# Patient Record
Sex: Male | Born: 1935 | Race: White | Hispanic: No | Marital: Married | Smoking: Never smoker
Health system: Southern US, Community
[De-identification: ages and names within clinical notes are randomized; demographics above are authoritative.]

## PROBLEM LIST (undated history)

## (undated) DIAGNOSIS — R251 Tremor, unspecified: Secondary | ICD-10-CM

## (undated) DIAGNOSIS — K5790 Diverticulosis of intestine, part unspecified, without perforation or abscess without bleeding: Secondary | ICD-10-CM

## (undated) DIAGNOSIS — R531 Weakness: Secondary | ICD-10-CM

## (undated) DIAGNOSIS — C089 Malignant neoplasm of major salivary gland, unspecified: Principal | ICD-10-CM

## (undated) DIAGNOSIS — D126 Benign neoplasm of colon, unspecified: Secondary | ICD-10-CM

## (undated) DIAGNOSIS — K449 Diaphragmatic hernia without obstruction or gangrene: Secondary | ICD-10-CM

## (undated) DIAGNOSIS — R0689 Other abnormalities of breathing: Secondary | ICD-10-CM

## (undated) DIAGNOSIS — IMO0002 Reserved for concepts with insufficient information to code with codable children: Secondary | ICD-10-CM

## (undated) DIAGNOSIS — G510 Bell's palsy: Secondary | ICD-10-CM

## (undated) DIAGNOSIS — R0600 Dyspnea, unspecified: Secondary | ICD-10-CM

## (undated) DIAGNOSIS — K222 Esophageal obstruction: Secondary | ICD-10-CM

## (undated) DIAGNOSIS — G629 Polyneuropathy, unspecified: Secondary | ICD-10-CM

## (undated) DIAGNOSIS — M48061 Spinal stenosis, lumbar region without neurogenic claudication: Secondary | ICD-10-CM

## (undated) DIAGNOSIS — R269 Unspecified abnormalities of gait and mobility: Secondary | ICD-10-CM

## (undated) DIAGNOSIS — Z923 Personal history of irradiation: Secondary | ICD-10-CM

## (undated) DIAGNOSIS — E785 Hyperlipidemia, unspecified: Secondary | ICD-10-CM

## (undated) DIAGNOSIS — H9190 Unspecified hearing loss, unspecified ear: Secondary | ICD-10-CM

## (undated) DIAGNOSIS — K219 Gastro-esophageal reflux disease without esophagitis: Secondary | ICD-10-CM

## (undated) DIAGNOSIS — D472 Monoclonal gammopathy: Secondary | ICD-10-CM

## (undated) DIAGNOSIS — R42 Dizziness and giddiness: Secondary | ICD-10-CM

## (undated) DIAGNOSIS — D751 Secondary polycythemia: Secondary | ICD-10-CM

## (undated) DIAGNOSIS — I10 Essential (primary) hypertension: Secondary | ICD-10-CM

## (undated) DIAGNOSIS — Z7189 Other specified counseling: Secondary | ICD-10-CM

## (undated) DIAGNOSIS — C4440 Unspecified malignant neoplasm of skin of scalp and neck: Secondary | ICD-10-CM

## (undated) DIAGNOSIS — J849 Interstitial pulmonary disease, unspecified: Secondary | ICD-10-CM

## (undated) DIAGNOSIS — IMO0001 Reserved for inherently not codable concepts without codable children: Secondary | ICD-10-CM

## (undated) DIAGNOSIS — J984 Other disorders of lung: Secondary | ICD-10-CM

## (undated) DIAGNOSIS — G250 Essential tremor: Secondary | ICD-10-CM

## (undated) DIAGNOSIS — R0609 Other forms of dyspnea: Secondary | ICD-10-CM

## (undated) DIAGNOSIS — D72820 Lymphocytosis (symptomatic): Secondary | ICD-10-CM

## (undated) DIAGNOSIS — R0989 Other specified symptoms and signs involving the circulatory and respiratory systems: Secondary | ICD-10-CM

## (undated) DIAGNOSIS — I493 Ventricular premature depolarization: Secondary | ICD-10-CM

## (undated) DIAGNOSIS — G519 Disorder of facial nerve, unspecified: Secondary | ICD-10-CM

## (undated) DIAGNOSIS — R634 Abnormal weight loss: Secondary | ICD-10-CM

## (undated) DIAGNOSIS — J986 Disorders of diaphragm: Secondary | ICD-10-CM

## (undated) DIAGNOSIS — R630 Anorexia: Secondary | ICD-10-CM

## (undated) DIAGNOSIS — R413 Other amnesia: Secondary | ICD-10-CM

## (undated) DIAGNOSIS — I080 Rheumatic disorders of both mitral and aortic valves: Secondary | ICD-10-CM

## (undated) DIAGNOSIS — E039 Hypothyroidism, unspecified: Secondary | ICD-10-CM

## (undated) DIAGNOSIS — C770 Secondary and unspecified malignant neoplasm of lymph nodes of head, face and neck: Secondary | ICD-10-CM

## (undated) DIAGNOSIS — G459 Transient cerebral ischemic attack, unspecified: Secondary | ICD-10-CM

## (undated) HISTORY — DX: Other forms of dyspnea: R06.09

## (undated) HISTORY — DX: Weakness: R53.1

## (undated) HISTORY — PX: NASAL SINUS SURGERY: SHX719

## (undated) HISTORY — DX: Dyspnea, unspecified: R06.00

## (undated) HISTORY — PX: APPENDECTOMY: SHX54

## (undated) HISTORY — DX: Essential (primary) hypertension: I10

## (undated) HISTORY — DX: Diaphragmatic hernia without obstruction or gangrene: K44.9

## (undated) HISTORY — DX: Polyneuropathy, unspecified: G62.9

## (undated) HISTORY — DX: Disorder of facial nerve, unspecified: G51.9

## (undated) HISTORY — PX: INGUINAL HERNIA REPAIR: SUR1180

## (undated) HISTORY — DX: Unspecified abnormalities of gait and mobility: R26.9

## (undated) HISTORY — DX: Interstitial pulmonary disease, unspecified: J84.9

## (undated) HISTORY — DX: Lymphocytosis (symptomatic): D72.820

## (undated) HISTORY — DX: Anorexia: R63.0

## (undated) HISTORY — DX: Secondary and unspecified malignant neoplasm of lymph nodes of head, face and neck: C77.0

## (undated) HISTORY — DX: Unspecified hearing loss, unspecified ear: H91.90

## (undated) HISTORY — PX: CARDIAC CATHETERIZATION: SHX172

## (undated) HISTORY — DX: Monoclonal gammopathy: D47.2

## (undated) HISTORY — PX: CARDIAC VALVE REPLACEMENT: SHX585

## (undated) HISTORY — DX: Tremor, unspecified: R25.1

## (undated) HISTORY — DX: Abnormal weight loss: R63.4

## (undated) HISTORY — DX: Other abnormalities of breathing: R06.89

## (undated) HISTORY — DX: Transient cerebral ischemic attack, unspecified: G45.9

## (undated) HISTORY — DX: Other specified symptoms and signs involving the circulatory and respiratory systems: R09.89

## (undated) HISTORY — DX: Personal history of irradiation: Z92.3

## (undated) HISTORY — DX: Other specified counseling: Z71.89

## (undated) HISTORY — PX: TONSILLECTOMY: SUR1361

## (undated) HISTORY — DX: Rheumatic disorders of both mitral and aortic valves: I08.0

## (undated) HISTORY — DX: Disorders of diaphragm: J98.6

## (undated) HISTORY — DX: Diverticulosis of intestine, part unspecified, without perforation or abscess without bleeding: K57.90

## (undated) HISTORY — PX: CATARACT EXTRACTION: SUR2

## (undated) HISTORY — DX: Hyperlipidemia, unspecified: E78.5

## (undated) HISTORY — DX: Other disorders of lung: J98.4

## (undated) HISTORY — DX: Benign neoplasm of colon, unspecified: D12.6

## (undated) HISTORY — DX: Other amnesia: R41.3

## (undated) HISTORY — DX: Esophageal obstruction: K22.2

## (undated) HISTORY — DX: Spinal stenosis, lumbar region without neurogenic claudication: M48.061

## (undated) HISTORY — DX: Essential tremor: G25.0

## (undated) HISTORY — DX: Bell's palsy: G51.0

## (undated) HISTORY — DX: Unspecified malignant neoplasm of skin of scalp and neck: C44.40

## (undated) HISTORY — DX: Ventricular premature depolarization: I49.3

## (undated) HISTORY — DX: Secondary polycythemia: D75.1

## (undated) HISTORY — DX: Hypothyroidism, unspecified: E03.9

## (undated) HISTORY — DX: Malignant neoplasm of major salivary gland, unspecified: C08.9

## (undated) HISTORY — DX: Dizziness and giddiness: R42

## (undated) HISTORY — DX: Gastro-esophageal reflux disease without esophagitis: K21.9

## (undated) HISTORY — PX: LUMBAR LAMINECTOMY: SHX95

## (undated) HISTORY — PX: ESOPHAGEAL DILATION: SHX303

---

## 1996-06-06 HISTORY — PX: CHOLECYSTECTOMY: SHX55

## 1998-02-04 ENCOUNTER — Ambulatory Visit (HOSPITAL_COMMUNITY): Admission: RE | Admit: 1998-02-04 | Discharge: 1998-02-04 | Payer: Self-pay | Admitting: Gastroenterology

## 2003-03-21 ENCOUNTER — Encounter (INDEPENDENT_AMBULATORY_CARE_PROVIDER_SITE_OTHER): Payer: Self-pay | Admitting: Specialist

## 2003-03-21 ENCOUNTER — Observation Stay (HOSPITAL_COMMUNITY): Admission: RE | Admit: 2003-03-21 | Discharge: 2003-03-21 | Payer: Self-pay

## 2003-06-07 DIAGNOSIS — R42 Dizziness and giddiness: Secondary | ICD-10-CM

## 2003-06-07 HISTORY — DX: Dizziness and giddiness: R42

## 2003-10-01 ENCOUNTER — Encounter: Payer: Self-pay | Admitting: Gastroenterology

## 2003-10-01 ENCOUNTER — Ambulatory Visit (HOSPITAL_COMMUNITY): Admission: RE | Admit: 2003-10-01 | Discharge: 2003-10-01 | Payer: Self-pay | Admitting: Gastroenterology

## 2004-04-21 ENCOUNTER — Ambulatory Visit: Payer: Self-pay | Admitting: Internal Medicine

## 2004-05-10 ENCOUNTER — Ambulatory Visit: Payer: Self-pay | Admitting: Internal Medicine

## 2004-07-29 ENCOUNTER — Ambulatory Visit: Payer: Self-pay | Admitting: Gastroenterology

## 2004-08-09 ENCOUNTER — Ambulatory Visit: Payer: Self-pay | Admitting: Gastroenterology

## 2004-08-30 ENCOUNTER — Ambulatory Visit: Payer: Self-pay | Admitting: Internal Medicine

## 2004-09-06 ENCOUNTER — Ambulatory Visit: Payer: Self-pay | Admitting: Internal Medicine

## 2004-09-13 ENCOUNTER — Ambulatory Visit: Payer: Self-pay | Admitting: Cardiology

## 2004-11-15 ENCOUNTER — Ambulatory Visit: Payer: Self-pay | Admitting: Internal Medicine

## 2005-07-28 ENCOUNTER — Ambulatory Visit: Payer: Self-pay | Admitting: Internal Medicine

## 2005-08-03 ENCOUNTER — Ambulatory Visit: Payer: Self-pay | Admitting: Internal Medicine

## 2005-08-09 ENCOUNTER — Ambulatory Visit: Payer: Self-pay | Admitting: Gastroenterology

## 2005-08-17 ENCOUNTER — Ambulatory Visit: Payer: Self-pay | Admitting: Internal Medicine

## 2005-11-15 ENCOUNTER — Encounter: Admission: RE | Admit: 2005-11-15 | Discharge: 2005-11-15 | Payer: Self-pay | Admitting: Orthopedic Surgery

## 2005-11-30 ENCOUNTER — Ambulatory Visit: Payer: Self-pay | Admitting: Internal Medicine

## 2006-05-11 ENCOUNTER — Ambulatory Visit: Payer: Self-pay | Admitting: Gastroenterology

## 2006-06-10 ENCOUNTER — Encounter: Admission: RE | Admit: 2006-06-10 | Discharge: 2006-06-10 | Payer: Self-pay | Admitting: Neurology

## 2006-07-25 ENCOUNTER — Ambulatory Visit: Payer: Self-pay | Admitting: Internal Medicine

## 2006-08-31 ENCOUNTER — Ambulatory Visit: Payer: Self-pay | Admitting: Internal Medicine

## 2006-08-31 LAB — CONVERTED CEMR LAB
ALT: 21 units/L (ref 0–40)
Albumin: 4.1 g/dL (ref 3.5–5.2)
Alkaline Phosphatase: 40 units/L (ref 39–117)
BUN: 16 mg/dL (ref 6–23)
Basophils Absolute: 0 10*3/uL (ref 0.0–0.1)
Basophils Relative: 0.4 % (ref 0.0–1.0)
CO2: 30 meq/L (ref 19–32)
Calcium: 9.4 mg/dL (ref 8.4–10.5)
Cholesterol: 165 mg/dL (ref 0–200)
GFR calc Af Amer: 106 mL/min
HDL: 54 mg/dL (ref >39.0)
Hemoglobin: 15.4 g/dL (ref 13.0–17.0)
LDL Cholesterol: 90 mg/dL (ref 0–99)
Lymphocytes Relative: 48 % — ABNORMAL HIGH (ref 12.0–46.0)
MCHC: 34.7 g/dL (ref 30.0–36.0)
Monocytes Absolute: 0.5 10*3/uL (ref 0.2–0.7)
Monocytes Relative: 5.6 % (ref 3.0–11.0)
Neutro Abs: 3.7 10*3/uL (ref 1.4–7.7)
Platelets: 238 10*3/uL (ref 150–400)
Potassium: 3.7 meq/L (ref 3.5–5.1)
RDW: 11.8 % (ref 11.5–14.6)
TSH: 3.1 microintl units/mL (ref 0.35–5.50)
Triglycerides: 105 mg/dL (ref 0–149)
VLDL: 21 mg/dL (ref 0–40)

## 2006-09-15 ENCOUNTER — Ambulatory Visit: Payer: Self-pay | Admitting: Internal Medicine

## 2006-09-15 LAB — CONVERTED CEMR LAB
Basophils Relative: 3.4 % — ABNORMAL HIGH (ref 0.0–1.0)
Eosinophils Absolute: 0.3 10*3/uL (ref 0.0–0.6)
Eosinophils Relative: 3.3 % (ref 0.0–5.0)
HCT: 44.8 % (ref 39.0–52.0)
Hemoglobin: 15.2 g/dL (ref 13.0–17.0)
Lipase: 28 units/L (ref 11.0–59.0)
Lymphocytes Relative: 45.2 % (ref 12.0–46.0)
MCV: 93.9 fL (ref 78.0–100.0)
Monocytes Absolute: 0.3 10*3/uL (ref 0.2–0.7)
Neutro Abs: 3.5 10*3/uL (ref 1.4–7.7)
Neutrophils Relative %: 43.8 % (ref 43.0–77.0)
Platelets: 238 10*3/uL (ref 150–400)
WBC: 8 10*3/uL (ref 4.5–10.5)

## 2006-12-05 ENCOUNTER — Telehealth: Payer: Self-pay | Admitting: Internal Medicine

## 2007-03-31 ENCOUNTER — Observation Stay (HOSPITAL_COMMUNITY): Admission: EM | Admit: 2007-03-31 | Discharge: 2007-04-01 | Payer: Self-pay | Admitting: Otolaryngology

## 2007-04-03 ENCOUNTER — Encounter: Admission: RE | Admit: 2007-04-03 | Discharge: 2007-04-03 | Payer: Self-pay | Admitting: Neurology

## 2007-04-18 ENCOUNTER — Encounter: Payer: Self-pay | Admitting: Internal Medicine

## 2007-05-21 ENCOUNTER — Encounter: Admission: RE | Admit: 2007-05-21 | Discharge: 2007-05-21 | Payer: Self-pay | Admitting: Neurology

## 2007-08-21 ENCOUNTER — Telehealth (INDEPENDENT_AMBULATORY_CARE_PROVIDER_SITE_OTHER): Payer: Self-pay | Admitting: *Deleted

## 2007-08-21 ENCOUNTER — Ambulatory Visit: Payer: Self-pay | Admitting: Internal Medicine

## 2007-08-27 ENCOUNTER — Ambulatory Visit: Payer: Self-pay | Admitting: Internal Medicine

## 2007-09-05 ENCOUNTER — Telehealth (INDEPENDENT_AMBULATORY_CARE_PROVIDER_SITE_OTHER): Payer: Self-pay | Admitting: *Deleted

## 2008-02-14 ENCOUNTER — Ambulatory Visit: Payer: Self-pay | Admitting: Internal Medicine

## 2008-02-14 DIAGNOSIS — K219 Gastro-esophageal reflux disease without esophagitis: Secondary | ICD-10-CM

## 2008-02-14 DIAGNOSIS — Z860100 Personal history of colon polyps, unspecified: Secondary | ICD-10-CM | POA: Insufficient documentation

## 2008-02-14 DIAGNOSIS — I10 Essential (primary) hypertension: Secondary | ICD-10-CM

## 2008-02-14 DIAGNOSIS — I080 Rheumatic disorders of both mitral and aortic valves: Secondary | ICD-10-CM

## 2008-02-14 DIAGNOSIS — E039 Hypothyroidism, unspecified: Secondary | ICD-10-CM

## 2008-02-14 DIAGNOSIS — K5730 Diverticulosis of large intestine without perforation or abscess without bleeding: Secondary | ICD-10-CM | POA: Insufficient documentation

## 2008-02-14 DIAGNOSIS — Z85828 Personal history of other malignant neoplasm of skin: Secondary | ICD-10-CM

## 2008-02-14 HISTORY — DX: Hypothyroidism, unspecified: E03.9

## 2008-02-14 HISTORY — DX: Rheumatic disorders of both mitral and aortic valves: I08.0

## 2008-02-14 HISTORY — DX: Gastro-esophageal reflux disease without esophagitis: K21.9

## 2008-02-14 HISTORY — DX: Essential (primary) hypertension: I10

## 2008-02-14 LAB — CONVERTED CEMR LAB
Blood in Urine, dipstick: NEGATIVE
Ketones, urine, test strip: NEGATIVE
Nitrite: NEGATIVE
Protein, U semiquant: NEGATIVE
Specific Gravity, Urine: 1.02

## 2008-02-15 ENCOUNTER — Encounter (INDEPENDENT_AMBULATORY_CARE_PROVIDER_SITE_OTHER): Payer: Self-pay | Admitting: *Deleted

## 2008-02-15 LAB — CONVERTED CEMR LAB
AST: 22 units/L (ref 0–37)
Alkaline Phosphatase: 51 units/L (ref 39–117)
Basophils Absolute: 0 10*3/uL (ref 0.0–0.1)
Chloride: 106 meq/L (ref 96–112)
Eosinophils Absolute: 0.1 10*3/uL (ref 0.0–0.7)
Eosinophils Relative: 1.6 % (ref 0.0–5.0)
GFR calc non Af Amer: 77 mL/min
HDL: 52.2 mg/dL (ref >39.0)
Lymphocytes Relative: 43.9 % (ref 12.0–46.0)
MCHC: 34.8 g/dL (ref 30.0–36.0)
MCV: 95.1 fL (ref 78.0–100.0)
Neutrophils Relative %: 49.1 % (ref 43.0–77.0)
Platelets: 237 10*3/uL (ref 150–400)
Potassium: 4.1 meq/L (ref 3.5–5.1)
Total Bilirubin: 1.4 mg/dL — ABNORMAL HIGH (ref 0.3–1.2)
Triglycerides: 50 mg/dL (ref 0–149)
VLDL: 10 mg/dL (ref 0–40)
WBC: 6.7 10*3/uL (ref 4.5–10.5)

## 2008-06-23 ENCOUNTER — Ambulatory Visit: Payer: Self-pay | Admitting: Internal Medicine

## 2008-06-23 DIAGNOSIS — J069 Acute upper respiratory infection, unspecified: Secondary | ICD-10-CM | POA: Insufficient documentation

## 2008-06-23 DIAGNOSIS — J309 Allergic rhinitis, unspecified: Secondary | ICD-10-CM | POA: Insufficient documentation

## 2008-07-31 ENCOUNTER — Ambulatory Visit: Payer: Self-pay | Admitting: Internal Medicine

## 2008-07-31 ENCOUNTER — Telehealth (INDEPENDENT_AMBULATORY_CARE_PROVIDER_SITE_OTHER): Payer: Self-pay | Admitting: *Deleted

## 2008-07-31 DIAGNOSIS — S298XXA Other specified injuries of thorax, initial encounter: Secondary | ICD-10-CM | POA: Insufficient documentation

## 2008-08-01 ENCOUNTER — Ambulatory Visit: Payer: Self-pay | Admitting: Internal Medicine

## 2008-08-01 ENCOUNTER — Encounter (INDEPENDENT_AMBULATORY_CARE_PROVIDER_SITE_OTHER): Payer: Self-pay | Admitting: *Deleted

## 2008-08-01 ENCOUNTER — Telehealth (INDEPENDENT_AMBULATORY_CARE_PROVIDER_SITE_OTHER): Payer: Self-pay | Admitting: *Deleted

## 2008-08-01 DIAGNOSIS — S6990XA Unspecified injury of unspecified wrist, hand and finger(s), initial encounter: Secondary | ICD-10-CM

## 2008-08-11 ENCOUNTER — Telehealth: Payer: Self-pay | Admitting: Internal Medicine

## 2008-08-12 ENCOUNTER — Ambulatory Visit: Payer: Self-pay | Admitting: Internal Medicine

## 2008-08-20 ENCOUNTER — Encounter: Payer: Self-pay | Admitting: Internal Medicine

## 2008-09-11 ENCOUNTER — Telehealth: Payer: Self-pay | Admitting: Internal Medicine

## 2008-09-23 ENCOUNTER — Encounter: Payer: Self-pay | Admitting: Internal Medicine

## 2008-10-01 ENCOUNTER — Encounter: Payer: Self-pay | Admitting: Internal Medicine

## 2008-10-20 ENCOUNTER — Telehealth: Payer: Self-pay | Admitting: Gastroenterology

## 2008-10-22 ENCOUNTER — Encounter: Payer: Self-pay | Admitting: Internal Medicine

## 2008-10-24 ENCOUNTER — Telehealth: Payer: Self-pay | Admitting: Internal Medicine

## 2008-10-27 ENCOUNTER — Ambulatory Visit: Payer: Self-pay | Admitting: Internal Medicine

## 2008-10-27 DIAGNOSIS — K222 Esophageal obstruction: Secondary | ICD-10-CM | POA: Insufficient documentation

## 2008-10-27 DIAGNOSIS — R1319 Other dysphagia: Secondary | ICD-10-CM

## 2008-10-27 DIAGNOSIS — R1310 Dysphagia, unspecified: Secondary | ICD-10-CM | POA: Insufficient documentation

## 2008-10-28 ENCOUNTER — Encounter: Payer: Self-pay | Admitting: Internal Medicine

## 2008-10-31 ENCOUNTER — Encounter: Payer: Self-pay | Admitting: Internal Medicine

## 2008-10-31 ENCOUNTER — Inpatient Hospital Stay (HOSPITAL_BASED_OUTPATIENT_CLINIC_OR_DEPARTMENT_OTHER): Admission: RE | Admit: 2008-10-31 | Discharge: 2008-10-31 | Payer: Self-pay | Admitting: Cardiovascular Disease

## 2008-11-06 ENCOUNTER — Encounter: Payer: Self-pay | Admitting: Internal Medicine

## 2008-11-06 ENCOUNTER — Ambulatory Visit: Payer: Self-pay | Admitting: Thoracic Surgery (Cardiothoracic Vascular Surgery)

## 2008-11-07 ENCOUNTER — Encounter
Admission: RE | Admit: 2008-11-07 | Discharge: 2008-11-07 | Payer: Self-pay | Admitting: Thoracic Surgery (Cardiothoracic Vascular Surgery)

## 2008-11-07 ENCOUNTER — Telehealth: Payer: Self-pay | Admitting: Internal Medicine

## 2008-11-13 ENCOUNTER — Telehealth: Payer: Self-pay | Admitting: Internal Medicine

## 2008-11-14 ENCOUNTER — Ambulatory Visit: Payer: Self-pay | Admitting: Internal Medicine

## 2008-11-14 ENCOUNTER — Ambulatory Visit (HOSPITAL_COMMUNITY): Admission: RE | Admit: 2008-11-14 | Discharge: 2008-11-14 | Payer: Self-pay | Admitting: Internal Medicine

## 2008-11-17 ENCOUNTER — Ambulatory Visit: Payer: Self-pay | Admitting: Thoracic Surgery (Cardiothoracic Vascular Surgery)

## 2008-11-17 ENCOUNTER — Encounter: Payer: Self-pay | Admitting: Internal Medicine

## 2008-11-18 ENCOUNTER — Ambulatory Visit (HOSPITAL_COMMUNITY)
Admission: RE | Admit: 2008-11-18 | Discharge: 2008-11-18 | Payer: Self-pay | Admitting: Thoracic Surgery (Cardiothoracic Vascular Surgery)

## 2008-11-19 ENCOUNTER — Encounter: Payer: Self-pay | Admitting: Thoracic Surgery (Cardiothoracic Vascular Surgery)

## 2008-11-19 ENCOUNTER — Ambulatory Visit (HOSPITAL_COMMUNITY)
Admission: RE | Admit: 2008-11-19 | Discharge: 2008-11-19 | Payer: Self-pay | Admitting: Thoracic Surgery (Cardiothoracic Vascular Surgery)

## 2008-11-25 ENCOUNTER — Ambulatory Visit: Payer: Self-pay | Admitting: Thoracic Surgery (Cardiothoracic Vascular Surgery)

## 2008-11-25 ENCOUNTER — Inpatient Hospital Stay (HOSPITAL_COMMUNITY)
Admission: RE | Admit: 2008-11-25 | Discharge: 2008-12-02 | Payer: Self-pay | Admitting: Thoracic Surgery (Cardiothoracic Vascular Surgery)

## 2008-11-25 ENCOUNTER — Encounter: Payer: Self-pay | Admitting: Thoracic Surgery (Cardiothoracic Vascular Surgery)

## 2008-11-25 HISTORY — PX: MITRAL VALVE REPLACEMENT: SHX147

## 2008-12-09 ENCOUNTER — Ambulatory Visit: Payer: Self-pay | Admitting: Thoracic Surgery (Cardiothoracic Vascular Surgery)

## 2008-12-12 ENCOUNTER — Encounter
Admission: RE | Admit: 2008-12-12 | Discharge: 2008-12-12 | Payer: Self-pay | Admitting: Thoracic Surgery (Cardiothoracic Vascular Surgery)

## 2008-12-15 ENCOUNTER — Ambulatory Visit: Payer: Self-pay | Admitting: Thoracic Surgery (Cardiothoracic Vascular Surgery)

## 2008-12-15 ENCOUNTER — Encounter: Payer: Self-pay | Admitting: Internal Medicine

## 2008-12-15 ENCOUNTER — Encounter
Admission: RE | Admit: 2008-12-15 | Discharge: 2008-12-15 | Payer: Self-pay | Admitting: Thoracic Surgery (Cardiothoracic Vascular Surgery)

## 2008-12-31 ENCOUNTER — Ambulatory Visit: Payer: Self-pay | Admitting: Thoracic Surgery (Cardiothoracic Vascular Surgery)

## 2008-12-31 ENCOUNTER — Encounter
Admission: RE | Admit: 2008-12-31 | Discharge: 2008-12-31 | Payer: Self-pay | Admitting: Thoracic Surgery (Cardiothoracic Vascular Surgery)

## 2008-12-31 ENCOUNTER — Encounter: Payer: Self-pay | Admitting: Internal Medicine

## 2009-01-23 ENCOUNTER — Encounter: Admission: RE | Admit: 2009-01-23 | Discharge: 2009-01-23 | Payer: Self-pay | Admitting: Cardiology

## 2009-01-26 ENCOUNTER — Ambulatory Visit: Payer: Self-pay | Admitting: Thoracic Surgery (Cardiothoracic Vascular Surgery)

## 2009-01-26 ENCOUNTER — Encounter: Payer: Self-pay | Admitting: Internal Medicine

## 2009-02-10 ENCOUNTER — Encounter: Payer: Self-pay | Admitting: Internal Medicine

## 2009-02-10 ENCOUNTER — Ambulatory Visit: Payer: Self-pay | Admitting: Thoracic Surgery (Cardiothoracic Vascular Surgery)

## 2009-03-09 ENCOUNTER — Encounter: Payer: Self-pay | Admitting: Internal Medicine

## 2009-03-09 ENCOUNTER — Ambulatory Visit: Payer: Self-pay | Admitting: Thoracic Surgery (Cardiothoracic Vascular Surgery)

## 2009-03-12 ENCOUNTER — Encounter (HOSPITAL_COMMUNITY): Admission: RE | Admit: 2009-03-12 | Discharge: 2009-06-02 | Payer: Self-pay | Admitting: Cardiology

## 2009-03-20 ENCOUNTER — Encounter: Payer: Self-pay | Admitting: Internal Medicine

## 2009-03-27 ENCOUNTER — Ambulatory Visit: Payer: Self-pay | Admitting: Vascular Surgery

## 2009-04-06 ENCOUNTER — Ambulatory Visit: Payer: Self-pay | Admitting: Thoracic Surgery (Cardiothoracic Vascular Surgery)

## 2009-04-06 ENCOUNTER — Encounter: Payer: Self-pay | Admitting: Internal Medicine

## 2009-04-27 ENCOUNTER — Ambulatory Visit: Payer: Self-pay | Admitting: Thoracic Surgery (Cardiothoracic Vascular Surgery)

## 2009-04-27 ENCOUNTER — Encounter: Payer: Self-pay | Admitting: Internal Medicine

## 2009-06-09 ENCOUNTER — Encounter (HOSPITAL_COMMUNITY): Admission: RE | Admit: 2009-06-09 | Discharge: 2009-09-07 | Payer: Self-pay | Admitting: Cardiology

## 2009-06-10 ENCOUNTER — Encounter: Payer: Self-pay | Admitting: Internal Medicine

## 2009-06-30 ENCOUNTER — Ambulatory Visit: Payer: Self-pay | Admitting: Internal Medicine

## 2009-06-30 DIAGNOSIS — R0602 Shortness of breath: Secondary | ICD-10-CM | POA: Insufficient documentation

## 2009-06-30 DIAGNOSIS — R634 Abnormal weight loss: Secondary | ICD-10-CM | POA: Insufficient documentation

## 2009-07-01 ENCOUNTER — Ambulatory Visit: Payer: Self-pay | Admitting: Internal Medicine

## 2009-07-03 ENCOUNTER — Ambulatory Visit: Payer: Self-pay | Admitting: Internal Medicine

## 2009-07-03 LAB — CONVERTED CEMR LAB
CO2: 32 meq/L (ref 19–32)
Chloride: 107 meq/L (ref 96–112)
Glucose, Bld: 88 mg/dL (ref 70–99)
Potassium: 4.1 meq/L (ref 3.5–5.1)
Sodium: 143 meq/L (ref 135–145)

## 2009-07-06 ENCOUNTER — Encounter: Payer: Self-pay | Admitting: Internal Medicine

## 2009-07-07 ENCOUNTER — Ambulatory Visit: Payer: Self-pay | Admitting: Internal Medicine

## 2009-07-07 ENCOUNTER — Encounter: Payer: Self-pay | Admitting: Internal Medicine

## 2009-07-08 ENCOUNTER — Telehealth: Payer: Self-pay | Admitting: Internal Medicine

## 2009-07-13 ENCOUNTER — Ambulatory Visit: Payer: Self-pay | Admitting: Internal Medicine

## 2009-07-14 ENCOUNTER — Encounter: Payer: Self-pay | Admitting: Internal Medicine

## 2009-07-14 ENCOUNTER — Ambulatory Visit (HOSPITAL_COMMUNITY): Admission: RE | Admit: 2009-07-14 | Discharge: 2009-07-14 | Payer: Self-pay | Admitting: Internal Medicine

## 2009-07-15 ENCOUNTER — Encounter: Payer: Self-pay | Admitting: Internal Medicine

## 2009-07-16 ENCOUNTER — Encounter: Payer: Self-pay | Admitting: Internal Medicine

## 2009-07-17 ENCOUNTER — Ambulatory Visit: Payer: Self-pay | Admitting: Internal Medicine

## 2009-07-17 DIAGNOSIS — R0982 Postnasal drip: Secondary | ICD-10-CM | POA: Insufficient documentation

## 2009-07-21 ENCOUNTER — Encounter: Payer: Self-pay | Admitting: Internal Medicine

## 2009-07-24 ENCOUNTER — Ambulatory Visit: Admission: RE | Admit: 2009-07-24 | Discharge: 2009-08-04 | Payer: Self-pay | Admitting: Radiation Oncology

## 2009-07-24 ENCOUNTER — Telehealth: Payer: Self-pay | Admitting: Internal Medicine

## 2009-07-28 ENCOUNTER — Encounter: Payer: Self-pay | Admitting: Internal Medicine

## 2009-07-28 ENCOUNTER — Encounter: Admission: RE | Admit: 2009-07-28 | Discharge: 2009-07-28 | Payer: Self-pay | Admitting: Urology

## 2009-07-29 ENCOUNTER — Encounter: Payer: Self-pay | Admitting: Internal Medicine

## 2009-07-29 ENCOUNTER — Telehealth: Payer: Self-pay | Admitting: Internal Medicine

## 2009-07-30 ENCOUNTER — Ambulatory Visit: Payer: Self-pay | Admitting: Oncology

## 2009-07-30 ENCOUNTER — Ambulatory Visit: Admission: RE | Admit: 2009-07-30 | Discharge: 2009-07-30 | Payer: Self-pay | Admitting: Internal Medicine

## 2009-08-03 ENCOUNTER — Encounter: Payer: Self-pay | Admitting: Internal Medicine

## 2009-08-03 ENCOUNTER — Encounter
Admission: RE | Admit: 2009-08-03 | Discharge: 2009-08-03 | Payer: Self-pay | Admitting: Thoracic Surgery (Cardiothoracic Vascular Surgery)

## 2009-08-03 ENCOUNTER — Ambulatory Visit: Payer: Self-pay | Admitting: Thoracic Surgery (Cardiothoracic Vascular Surgery)

## 2009-08-04 ENCOUNTER — Ambulatory Visit (HOSPITAL_COMMUNITY): Admission: RE | Admit: 2009-08-04 | Discharge: 2009-08-04 | Payer: Self-pay | Admitting: Oncology

## 2009-08-04 ENCOUNTER — Telehealth (INDEPENDENT_AMBULATORY_CARE_PROVIDER_SITE_OTHER): Payer: Self-pay | Admitting: *Deleted

## 2009-08-04 ENCOUNTER — Encounter: Payer: Self-pay | Admitting: Internal Medicine

## 2009-08-04 LAB — CBC WITH DIFFERENTIAL/PLATELET
Basophils Absolute: 0.1 10*3/uL (ref 0.0–0.1)
EOS%: 1.1 % (ref 0.0–7.0)
HCT: 50.2 % — ABNORMAL HIGH (ref 38.4–49.9)
HGB: 17.3 g/dL — ABNORMAL HIGH (ref 13.0–17.1)
MCH: 32.3 pg (ref 27.2–33.4)
NEUT%: 58.7 % (ref 39.0–75.0)
lymph#: 2.8 10*3/uL (ref 0.9–3.3)

## 2009-08-06 LAB — COMPREHENSIVE METABOLIC PANEL
Albumin: 4.6 g/dL (ref 3.5–5.2)
BUN: 21 mg/dL (ref 6–23)
Calcium: 9.4 mg/dL (ref 8.4–10.5)
Chloride: 104 mEq/L (ref 96–112)
Glucose, Bld: 82 mg/dL (ref 70–99)
Potassium: 4.4 mEq/L (ref 3.5–5.3)

## 2009-08-06 LAB — KAPPA/LAMBDA LIGHT CHAINS
Kappa free light chain: 1 mg/dL (ref 0.33–1.94)
Kappa:Lambda Ratio: 0.79 (ref 0.26–1.65)
Lambda Free Lght Chn: 1.26 mg/dL (ref 0.57–2.63)

## 2009-08-06 LAB — SPEP & IFE WITH QIG
Alpha-1-Globulin: 4.5 % (ref 2.9–4.9)
Alpha-2-Globulin: 9.3 % (ref 7.1–11.8)
Beta 2: 2.1 % — ABNORMAL LOW (ref 3.2–6.5)
Beta Globulin: 6.5 % (ref 4.7–7.2)
Gamma Globulin: 9.6 % — ABNORMAL LOW (ref 11.1–18.8)

## 2009-08-12 LAB — UIFE/LIGHT CHAINS/TP QN, 24-HR UR
Albumin, U: DETECTED
Free Kappa Lt Chains,Ur: 0.75 mg/dL (ref 0.04–1.51)
Free Lt Chn Excr Rate: 12.19 mg/d
Total Protein, Urine-Ur/day: 21 mg/d (ref 10–140)
Volume, Urine: 1625 mL

## 2009-08-14 ENCOUNTER — Ambulatory Visit (HOSPITAL_COMMUNITY): Admission: RE | Admit: 2009-08-14 | Discharge: 2009-08-15 | Payer: Self-pay | Admitting: Interventional Radiology

## 2009-08-14 ENCOUNTER — Encounter (INDEPENDENT_AMBULATORY_CARE_PROVIDER_SITE_OTHER): Payer: Self-pay | Admitting: Interventional Radiology

## 2009-08-14 HISTORY — PX: LAPAROSCOPIC ABLATION RENAL MASS: SUR751

## 2009-08-16 ENCOUNTER — Telehealth: Payer: Self-pay | Admitting: Internal Medicine

## 2009-08-24 ENCOUNTER — Ambulatory Visit: Payer: Self-pay | Admitting: Internal Medicine

## 2009-08-24 ENCOUNTER — Encounter: Payer: Self-pay | Admitting: Pulmonary Disease

## 2009-08-26 ENCOUNTER — Encounter: Payer: Self-pay | Admitting: Internal Medicine

## 2009-09-03 ENCOUNTER — Encounter: Payer: Self-pay | Admitting: Internal Medicine

## 2009-09-07 ENCOUNTER — Encounter: Payer: Self-pay | Admitting: Internal Medicine

## 2009-09-07 ENCOUNTER — Ambulatory Visit (HOSPITAL_COMMUNITY): Admission: RE | Admit: 2009-09-07 | Discharge: 2009-09-07 | Payer: Self-pay | Admitting: Internal Medicine

## 2009-09-11 ENCOUNTER — Encounter: Payer: Self-pay | Admitting: Internal Medicine

## 2009-09-14 ENCOUNTER — Ambulatory Visit: Payer: Self-pay | Admitting: Internal Medicine

## 2009-09-14 ENCOUNTER — Telehealth: Payer: Self-pay | Admitting: Internal Medicine

## 2009-09-15 LAB — CONVERTED CEMR LAB
BUN: 19 mg/dL (ref 6–23)
CO2: 33 meq/L — ABNORMAL HIGH (ref 19–32)
Chloride: 100 meq/L (ref 96–112)
Glucose, Bld: 94 mg/dL (ref 70–99)
Potassium: 4.4 meq/L (ref 3.5–5.1)

## 2009-09-17 ENCOUNTER — Telehealth: Payer: Self-pay | Admitting: Internal Medicine

## 2009-09-22 ENCOUNTER — Ambulatory Visit: Payer: Self-pay | Admitting: Internal Medicine

## 2009-09-23 ENCOUNTER — Ambulatory Visit (HOSPITAL_COMMUNITY): Admission: RE | Admit: 2009-09-23 | Discharge: 2009-09-23 | Payer: Self-pay | Admitting: Interventional Radiology

## 2009-09-23 ENCOUNTER — Encounter: Admission: RE | Admit: 2009-09-23 | Discharge: 2009-09-23 | Payer: Self-pay | Admitting: Interventional Radiology

## 2009-10-05 ENCOUNTER — Encounter: Admission: RE | Admit: 2009-10-05 | Discharge: 2009-10-05 | Payer: Self-pay | Admitting: Cardiology

## 2009-10-05 ENCOUNTER — Encounter: Payer: Self-pay | Admitting: Internal Medicine

## 2009-10-08 ENCOUNTER — Encounter: Payer: Self-pay | Admitting: Internal Medicine

## 2009-10-13 ENCOUNTER — Encounter (INDEPENDENT_AMBULATORY_CARE_PROVIDER_SITE_OTHER): Payer: Self-pay | Admitting: *Deleted

## 2009-10-15 ENCOUNTER — Ambulatory Visit: Payer: Self-pay | Admitting: Internal Medicine

## 2009-10-30 ENCOUNTER — Telehealth: Payer: Self-pay | Admitting: Internal Medicine

## 2009-11-03 ENCOUNTER — Telehealth: Payer: Self-pay | Admitting: Internal Medicine

## 2009-11-13 ENCOUNTER — Encounter: Payer: Self-pay | Admitting: Internal Medicine

## 2009-12-22 ENCOUNTER — Telehealth: Payer: Self-pay | Admitting: Internal Medicine

## 2009-12-25 ENCOUNTER — Ambulatory Visit: Payer: Self-pay | Admitting: Internal Medicine

## 2009-12-29 ENCOUNTER — Encounter: Payer: Self-pay | Admitting: Internal Medicine

## 2009-12-29 ENCOUNTER — Ambulatory Visit (HOSPITAL_COMMUNITY): Admission: RE | Admit: 2009-12-29 | Discharge: 2009-12-29 | Payer: Self-pay | Admitting: Internal Medicine

## 2009-12-30 LAB — CONVERTED CEMR LAB: Vit D, 25-Hydroxy: 49 ng/mL (ref 30–89)

## 2009-12-31 ENCOUNTER — Encounter: Payer: Self-pay | Admitting: Internal Medicine

## 2009-12-31 ENCOUNTER — Ambulatory Visit: Payer: Self-pay | Admitting: Internal Medicine

## 2009-12-31 DIAGNOSIS — R413 Other amnesia: Secondary | ICD-10-CM | POA: Insufficient documentation

## 2009-12-31 DIAGNOSIS — R1909 Other intra-abdominal and pelvic swelling, mass and lump: Secondary | ICD-10-CM | POA: Insufficient documentation

## 2009-12-31 HISTORY — DX: Other amnesia: R41.3

## 2010-01-27 ENCOUNTER — Encounter: Payer: Self-pay | Admitting: Internal Medicine

## 2010-02-04 ENCOUNTER — Ambulatory Visit: Payer: Self-pay | Admitting: Oncology

## 2010-02-11 ENCOUNTER — Ambulatory Visit: Payer: Self-pay | Admitting: Internal Medicine

## 2010-02-11 DIAGNOSIS — R0609 Other forms of dyspnea: Secondary | ICD-10-CM

## 2010-02-11 DIAGNOSIS — R0600 Dyspnea, unspecified: Secondary | ICD-10-CM | POA: Insufficient documentation

## 2010-02-11 DIAGNOSIS — R0989 Other specified symptoms and signs involving the circulatory and respiratory systems: Secondary | ICD-10-CM

## 2010-02-11 HISTORY — DX: Other forms of dyspnea: R06.09

## 2010-02-11 HISTORY — DX: Other specified symptoms and signs involving the circulatory and respiratory systems: R09.89

## 2010-02-15 ENCOUNTER — Encounter: Payer: Self-pay | Admitting: Internal Medicine

## 2010-02-15 ENCOUNTER — Ambulatory Visit: Payer: Self-pay | Admitting: Cardiology

## 2010-02-15 ENCOUNTER — Ambulatory Visit: Payer: Self-pay | Admitting: Thoracic Surgery (Cardiothoracic Vascular Surgery)

## 2010-02-17 ENCOUNTER — Encounter: Payer: Self-pay | Admitting: Internal Medicine

## 2010-03-16 ENCOUNTER — Ambulatory Visit: Payer: Self-pay | Admitting: Internal Medicine

## 2010-03-16 ENCOUNTER — Telehealth: Payer: Self-pay | Admitting: Internal Medicine

## 2010-03-16 DIAGNOSIS — R29890 Loss of height: Secondary | ICD-10-CM

## 2010-03-17 ENCOUNTER — Encounter (INDEPENDENT_AMBULATORY_CARE_PROVIDER_SITE_OTHER): Payer: Self-pay | Admitting: *Deleted

## 2010-03-30 ENCOUNTER — Encounter (HOSPITAL_COMMUNITY)
Admission: RE | Admit: 2010-03-30 | Discharge: 2010-06-28 | Payer: Self-pay | Source: Home / Self Care | Attending: Internal Medicine | Admitting: Internal Medicine

## 2010-03-31 ENCOUNTER — Encounter: Payer: Self-pay | Admitting: Internal Medicine

## 2010-03-31 ENCOUNTER — Ambulatory Visit: Payer: Self-pay | Admitting: Family Medicine

## 2010-05-26 ENCOUNTER — Telehealth: Payer: Self-pay | Admitting: Internal Medicine

## 2010-06-04 ENCOUNTER — Ambulatory Visit: Payer: Self-pay | Admitting: Internal Medicine

## 2010-06-06 HISTORY — PX: MOHS SURGERY: SUR867

## 2010-06-08 ENCOUNTER — Telehealth (INDEPENDENT_AMBULATORY_CARE_PROVIDER_SITE_OTHER): Payer: Self-pay | Admitting: *Deleted

## 2010-06-09 ENCOUNTER — Other Ambulatory Visit: Payer: Self-pay | Admitting: Internal Medicine

## 2010-06-09 LAB — BASIC METABOLIC PANEL
BUN: 19 mg/dL (ref 6–23)
CO2: 30 mEq/L (ref 19–32)
Calcium: 9 mg/dL (ref 8.4–10.5)
Chloride: 102 mEq/L (ref 96–112)
Creatinine, Ser: 1 mg/dL (ref 0.4–1.5)
GFR: 73.8 mL/min (ref >60.00)
Glucose, Bld: 96 mg/dL (ref 70–99)
Potassium: 4.4 mEq/L (ref 3.5–5.1)
Sodium: 138 mEq/L (ref 135–145)

## 2010-06-16 ENCOUNTER — Encounter
Admission: RE | Admit: 2010-06-16 | Discharge: 2010-06-16 | Payer: Self-pay | Source: Home / Self Care | Attending: Interventional Radiology | Admitting: Interventional Radiology

## 2010-06-16 ENCOUNTER — Ambulatory Visit (HOSPITAL_COMMUNITY)
Admission: RE | Admit: 2010-06-16 | Discharge: 2010-06-16 | Payer: Self-pay | Source: Home / Self Care | Attending: Interventional Radiology | Admitting: Interventional Radiology

## 2010-06-18 ENCOUNTER — Ambulatory Visit: Payer: Self-pay | Admitting: Cardiology

## 2010-06-20 ENCOUNTER — Emergency Department (HOSPITAL_COMMUNITY)
Admission: EM | Admit: 2010-06-20 | Discharge: 2010-06-20 | Payer: Self-pay | Source: Home / Self Care | Admitting: Emergency Medicine

## 2010-06-26 ENCOUNTER — Encounter: Payer: Self-pay | Admitting: Internal Medicine

## 2010-06-27 ENCOUNTER — Encounter: Payer: Self-pay | Admitting: Thoracic Surgery (Cardiothoracic Vascular Surgery)

## 2010-06-27 ENCOUNTER — Encounter: Payer: Self-pay | Admitting: Internal Medicine

## 2010-06-28 ENCOUNTER — Encounter: Payer: Self-pay | Admitting: Thoracic Surgery (Cardiothoracic Vascular Surgery)

## 2010-07-07 ENCOUNTER — Encounter: Payer: Self-pay | Admitting: Internal Medicine

## 2010-07-08 NOTE — Letter (Signed)
Summary: [facility] [place] [facility]   Imported By: Sherian Rein 07/17/2009 08:36:42  _____________________________________________________________________  External Attachment:    Type:   Image     Comment:   External Document

## 2010-07-08 NOTE — Letter (Signed)
[facility]   Imported By: Sherian Rein 09/21/2009 10:18:56  _____________________________________________________________________  External Attachment:    Type:   Image     Comment:   External Document

## 2010-07-08 NOTE — Progress Notes (Signed)
Summary: nos appt  Phone Note Call from Patient   Caller: [email or web address]  Call For: Bastion Bolger Summary of Call: Pt's wife states he will come in today to rsc nos from 5/27. Initial call taken by: Darletta Moll,  Nov 03, 2009 3:20 PM

## 2010-07-08 NOTE — Miscellaneous (Signed)
----   Converted from flag ---- ---- 12/31/2009 11:01 AM, Okey Regal Spring wrote: Dr Alwyn Ren - patient returned my call he wanted to know if you were aware of his surgeries - he siad he was seeing dr Marchelle Gearing today [phone] - he didnt want to schedule lab - i told him lab cold be done today but he wanted to talk to dr Marchelle Gearing - he scheduled appt [phone] to see your didnt want earlier appt   ---- 12/29/2009 4:30 PM, Okey Regal Spring wrote: lmom again  ---- 12/29/2009 4:28 PM, Okey Regal Spring wrote: lmom [id number] @ 9:50   ---- 12/27/2009 12:42 PM, Marga Melnick MD wrote: Diamantina Providence be happy to see him  to do screening MMSE, B12, TSH & VDRL; but Melbourne Abts, MD usually evaluates dementia if suspect. Hopp  ---- 12/25/2009 5:54 PM, Kalman Shan MD wrote: [place], I have been seeing MR. Vaquera for a while now for dyspnea. In his visits with me, Dr. Cornelius Moras and Dr. Sandria Manly it seems a common thread is poor memory. In a face to face conversation with Dr Cornelius Moras he too once thought this. Do you mind making an assessment for dementia please?  I am not sure if this is related to dyspnea or not. Thanks, MR ------------------------------

## 2010-07-08 NOTE — Miscellaneous (Signed)
Summary: sniff test   ---- Converted from flag ---- ---- 09/28/2009 9:23 AM, Kalman Shan MD wrote: ok., pls convert to permanent med record  ---- 09/22/2009 12:10 PM, Alfonso Ramus wrote: MR Mr. Tse stated that he was going out of town from [place]. He wanted test scheduled last of May. I went ahead and scheduled this for May 26th at 9:00 at [facility].  Please advise if this is not ok. Thanks, Bjorn Loser ------------------------------

## 2010-07-08 NOTE — Progress Notes (Signed)
Summary: follllowup ?  Phone Note Outgoing Call   Summary of Call: when is fu? Initial call taken by: Kalman Shan MD,  August 16, 2009 9:19 PM  Follow-up for Phone Call        march 21. [name] [facility]  August 17, 2009 8:30 AM   Additional Follow-up for Phone Call Additional follow up Details #1::        ok thansk

## 2010-07-08 NOTE — Letter (Signed)
Summary: [facility]   Imported By: Sherian Rein 09/21/2009 14:00:19  _____________________________________________________________________  External Attachment:    Type:   Image     Comment:   External Document

## 2010-07-08 NOTE — Letter (Signed)
Summary: [facility]   Imported By: Lanelle Bal 08/13/2009 11:01:13  _____________________________________________________________________  External Attachment:    Type:   Image     Comment:   External Document
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
## 2010-07-08 NOTE — Miscellaneous (Signed)
Summary: Order for Pulmonary Rehab/[facility]  Order for Pulmonary Rehab/[facility]   Imported By: Sherian Rein 01/06/2010 08:58:12  _____________________________________________________________________  External Attachment:    Type:   Image     Comment:   External Document

## 2010-07-08 NOTE — Letter (Signed)
Summary: [facility]  [address]   [place], [place] [address]   Phone: [phone]  Fax: [phone]     Patient's Name: Adrian West Date of Birth: [birth year] MRN: [id number]  [facility]  Choose test method and choice  a)___Bike - recommended by ATS/ACCP. Do at [facility] at Dr. Gala Romney Lab  b)___Treadmill - less preferred. Do at [facility] at Dr. Gala Romney lab or do at [facility] PFT lab  Choose one or more indication for test  INDICATIONS FOR CARDIOPULMONARY EXERCISE TESTING Evaluation of exercise tolerance ______ Determination of functional impairment or capacity (peak V? O2) ______ Determination of exercise-limiting factors and pathophysiologic mechanisms  Evaluation of undiagnosed exercise intolerance _____ Assessing contribution of cardiac and pulmonary etiology in coexisting disease _____ Symptoms disproportionate to resting pulmonary and cardiac tests  _____Unexplained dyspnea when initial cardiopulmonary testing is nondiagnostic  Evaluation of patients with cardiovascular disease _____ Functional evaluation and prognosis in patients with heart failure _____ Selection for cardiac transplantation _____ Exercise prescription and monitoring response to exercise training for cardiac rehabilitation (special circumstances; i.e., pacemakers)  Evaluation of patients with respiratory disease _____ Functional impairment assessment (see specific clinical applications)  _____Chronic obstructive pulmonary disease Establishing exercise limitation(s) and assessing other potential contributing factors, especially occult heart disease (ischemia) ______Determination of magnitude of hypoxemia and for O2 prescription When objective determination of therapeutic intervention is necessary and not adequately addressed by standard pulmonary function testing  _____ Interstitial lung diseases _____Detection of early (occult) gas exchange abnormalities _____Overall  assessment/monitoring of pulmonary gas exchange _____Determination of magnitude of hypoxemia and for O2 prescription _____Determination of potential exercise-limiting factors _____Documentation of therapeutic response to potentially toxic therapy  ____ Pulmonary vascular disease (careful risk-benefit analysis required)  ____ Cystic fibrosis  ____ Exercise-induced bronchospasm  Specific clinical applications ____  Preoperative evaluation _____Lung resectional surgery _____Elderly patients undergoing major abdominal surgery _____Lung volume resectional surgery for emphysema (currently investigational)  ____ Exercise evaluation and prescription for pulmonary rehabilitation  ____ Evaluation for impairment-disability  ____ Evaluation for lung, heart-lung transplantation ____ Definition of abbreviation: V? O2______ -oxygen consumption.    [facility]    [facility]

## 2010-07-08 NOTE — Letter (Signed)
Summary: [facility]  [facility]   Imported By: Lanelle Bal 08/24/2009 11:56:15  _____________________________________________________________________  External Attachment:    Type:   Image     Comment:   External Document

## 2010-07-08 NOTE — Miscellaneous (Addendum)
Summary: BONE DENSITY  Clinical Lists Changes  Orders: Added new Test order of T-Bone Densitometry [phone]) - Signed Added new Test order of T-Lumbar Vertebral Assessment (95188) - Signed  Appended Document: BONE DENSITY ct results faxed o Dr. Fredia Sorrow. i will call on mondya to make sure they received it. Adrian West
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
## 2010-07-08 NOTE — Letter (Signed)
Summary: [facility]  [address]   [place], [place] [address]   Phone: [phone]  Fax: [phone]     Patient's Name: Adrian West Date of Birth: [birth year] MRN: [id number]  CPST  Choose test method and choice  a)__x_Bike - recommended by ATS/ACCP. Do at [facility] at Dr. Gala Romney [facility]  b)___Treadmill - less preferred. Do at [facility] at Dr. Gala Romney lab or do at [facility] PFT lab  Choose one or more indication for test: KNOWN UNILATER RIGHT DIAPHRAGM PARALYSIS AFTER MITRAL VALVE SURGERY  INDICATIONS FOR CARDIOPULMONARY EXERCISE TESTING Evaluation of exercise tolerance ____x__ Determination of functional impairment or capacity (peak V? O2) ____x__ Determination of exercise-limiting factors and pathophysiologic mechanisms  Evaluation of undiagnosed exercise intolerance ___x__ Assessing contribution of cardiac and pulmonary etiology in coexisting disease _____ Symptoms disproportionate to resting pulmonary and cardiac tests  __x___Unexplained dyspnea when initial cardiopulmonary testing is nondiagnostic  Evaluation of patients with cardiovascular disease _____ Functional evaluation and prognosis in patients with heart failure _____ Selection for cardiac transplantation _____ Exercise prescription and monitoring response to exercise training for cardiac rehabilitation (special circumstances; i.e., pacemakers)  Evaluation of patients with respiratory disease _____ Functional impairment assessment (see specific clinical applications)  _____Chronic obstructive pulmonary disease Establishing exercise limitation(s) and assessing other potential contributing factors, especially occult heart disease (ischemia) ______Determination of magnitude of hypoxemia and for O2 prescription When objective determination of therapeutic intervention is necessary and not adequately addressed by standard pulmonary function testing  _____ Interstitial lung  diseases _____Detection of early (occult) gas exchange abnormalities _____Overall assessment/monitoring of pulmonary gas exchange _____Determination of magnitude of hypoxemia and for O2 prescription _____Determination of potential exercise-limiting factors _____Documentation of therapeutic response to potentially toxic therapy  ____ Pulmonary vascular disease (careful risk-benefit analysis required)  ____ Cystic fibrosis  __X__ Exercise-induced bronchospasm  Specific clinical applications ____  Preoperative evaluation _____Lung resectional surgery _____Elderly patients undergoing major abdominal surgery _____Lung volume resectional surgery for emphysema (currently investigational)  ____ Exercise evaluation and prescription for pulmonary rehabilitation  ____ Evaluation for impairment-disability  ____ Evaluation for lung, heart-lung transplantation ____ Definition of abbreviation: V? O2______ -oxygen consumption.    Kalman Shan MD    [facility]

## 2010-07-08 NOTE — Miscellaneous (Signed)
Summary: spacer rx  Clinical Lists Changes  Medications: Added new medication of E-Z SPACER  DEVI (SPACER/AERO-HOLDING CHAMBERS) as directed with inhaler - Signed Rx of E-Z SPACER  DEVI (SPACER/AERO-HOLDING CHAMBERS) as directed with inhaler;  #1 x 0;  Signed;  Entered by: [name] CMA;  Authorized by: Kalman Shan MD;  Method used: Printed then faxed to [facility] [address] [place], [place]  [id number], Ph: [phone] or [phone], Fax: [phone]    Prescriptions: E-Z SPACER  DEVI (SPACER/AERO-HOLDING CHAMBERS) as directed with inhaler  #1 x 0   Entered by:   [name] CMA   Authorized by:   Kalman Shan MD   Signed by:   [name] CMA on 07/15/2009   Method used:   Printed then faxed to ...       [facility] (retail)       [address]       [place], [place]  [id number]       Ph: [phone] or [phone]       Fax: [phone]   RxID:   [id number]
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
## 2010-07-08 NOTE — Procedures (Signed)
Summary: Recall / [facility]  Recall / [facility]   Imported By: Lennie Odor 11/05/2009 14:37:18  _____________________________________________________________________  External Attachment:    Type:   Image     Comment:   External Document

## 2010-07-08 NOTE — Letter (Signed)
Summary: Primary Care Consult Scheduled Letter  [name] [place]  [address] [place], [place] [address]   Phone: [phone]  Fax: [phone]      03/17/2010 MRN: [id number]  [facility] Adrian West [address] [place], [place]  [address]    Dear Adrian West,    We have scheduled an appointment for you.  At the recommendation of Dr. Kalman Shan & Dr. Marga Melnick, we have scheduled you for a Bone Density Scan with [facility] on 03-31-2010 at 10:30am.  Their address is [facility], [address] [place], [place] [address].  The office phone number is [phone].  If this appointment day and time is not convenient for you, please feel free to call the office of the doctor you are being referred to at the number listed above and reschedule the appointment.    It is important for you to keep your scheduled appointments. We are here to make sure you are given good patient care.   Thank you,    Renee, Patient Care Coordinator [place] at [facility]

## 2010-07-08 NOTE — Miscellaneous (Signed)
Summary: Orders Update pft charges  Clinical Lists Changes  Orders: Added new Service order of Carbon Monoxide diffusing w/capacity (94720) - Signed Added new Service order of Lung Volumes (94240) - Signed Added new Service order of Spirometry (Pre & Post) (94060) - Signed 

## 2010-07-08 NOTE — Progress Notes (Signed)
Summary: LMTCB about appt x2  Phone Note Outgoing Call   Call placed by: [name] CMA,  Oct 30, 2009 2:06 PM Call placed to: Patient Summary of Call: [facility] with pt because he did not come in for appt and no showed for the Sniff test that was scheduled for Mat 26th. Want to ask pt if he is better and that is why he did not have test done.  Initial call taken by: [name] CMA,  Oct 30, 2009 2:07 PM  Follow-up for Phone Call        LMTCBx2. [name] CMA  November 05, 2009 4:16 PM  LMTCB. will send letter to pt asking him to call our office. [name] CMA  November 13, 2009 3:05 PM

## 2010-07-08 NOTE — Letter (Signed)
Summary: CPST- R/O Contraindications  [facility]  [address]   [place], [place] [address]   Phone: [phone]  Fax: [phone]    Patient's Name: Adrian West Date of Birth: 17-Jun-1927 MRN: [id number]  *********Rule out Contraindications**************** Absolute                                                                                                                           ___ Acute MI (3-5 Days)                                 ___ Unstable Angina                                          ___ Uncontrolled arrhythmias causing symptoms or hemodynamic compromise. ___ Syncope                                                     ___ Active endocarditis                                         ___ Acute Myocarditis/Pericarditis                        ___ Symptomatic severe aortic stenosis  ___ Acute Pulmonary embolus or pulmonary infarction                ___ Uncontrolled Heart Failure  ___ Thrombosis of lower extremitie ___ Suspected dissecting aneurysm ___ Uncontrolled Asthma                          ___ Pulmonary Edema                                        ___ RA desat @ rest<85%                                      ___ Repiratory Failure                                         ___ Acute noncardiopulmonary disorder that may affect exercise performance or be         aggravated by exercise (ie infection, renal failure,  thyrotoxicosis) .                               ___ Mental impairment leading to inabliity to cooperate   Relative ___ Left main coronary stenosis or its equivalent ___ Moderate stentoic valvular heart disease ___ Severe untreated arterial hypertension @ rest (<200 mmHg             systolic,>119mmHg Diastolic ___ Tachy/Brady Arrhythmias ___ High- degree artioventricular block ___ Hypertrophic cardiomyopathy ___ Significant pulmonary hypertension ___ Advanced or complicated pregnancy ___ Electrolyte abnormalities ___ Orthopedic impairment  that compromises exercise performance    [employer] [facility]

## 2010-07-08 NOTE — Letter (Signed)
SummaryScience writer Pulmonary Care Appointment Letter  [facility]  [address]   [place], [place] [address]   Phone: [phone]  Fax: [phone]    10/13/2009 MRN: [id number]  [facility] [address] [address] [place], [place]  [address]  Dear Adrian West,   Our office is attempting to contact you about an appointment.  Please call our office at [phone] to schedule this appointment with Dr. Kalman Shan.  Your appointment for November 04, 2009 has been cancelled.  Our registration staff is prepared to assist you with any questions you may have.    Thank you,   Nature conservation officer Pulmonary Division
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
## 2010-07-08 NOTE — Letter (Signed)
[facility]   Imported By: Lester [place] 08/26/2009 08:58:14  _____________________________________________________________________  External Attachment:    Type:   Image     Comment:   External Document

## 2010-07-08 NOTE — Progress Notes (Signed)
Summary: lab orders--[facility]-will call back  Phone Note Call from Patient   Caller: henri with [facility] Call For: dr Sandria Manly Summary of Call: Henri with [facility] phoned regarding Adrian West she needs some labs ordered for BUN and Creatin [phone] they CT of his kidneys Patient is being very demanding and wants to have the labs drawn today. Initial call taken by: Vedia Coffer,  June 08, 2010 2:14 PM  Follow-up for Phone Call        spoke with [facility] and henri was not available, I told the person on the phone to have them call me back and gave my direct #  the order is in the computer here and pt can come into the lab anytime that is good for him as long as we have 24 hrs to get results called over to [facility]--will await call back Philipp Deputy [facility]  June 08, 2010 4:35 PM   Additional Follow-up for Phone Call Additional follow up Details #1::        spoke to tammy at [facility]-she is aware order is in the computer and she will call pt Additional Follow-up by: Boone Master CNA/MA,  June 09, 2010 1:47 PM

## 2010-07-08 NOTE — Progress Notes (Signed)
Summary: pulmonary clearance question  Phone Note From Other Clinic   Caller: Adrian West imaiging Call For: Adrian West Summary of Call: calling from dr Adrian West    office  pt is a canadate for renal cryo ablation and they need to know if he needs pulmonary clearance for this procedure  [phone] Adrian West Initial call taken by: Tivis Ringer, CNA,  July 29, 2009 12:10 PM  Follow-up for Phone Call        advised Adrian West that we are in the process of getting pt scheduled for an ABG and once those results are received MR will review and decide on clearance. Adrian West CMA  July 29, 2009 12:18 PM      Appended Document: pulmonary clearance question aBG 07/30/2009 is 7.45/40/80 on Room air at rest. He is low risk for pulmonary complications from renal procedure.

## 2010-07-08 NOTE — Letter (Signed)
Summary: [facility]   Imported By: Lester [place] 07/14/2009 07:40:11  _____________________________________________________________________  External Attachment:    Type:   Image     Comment:   External Document

## 2010-07-08 NOTE — Consult Note (Signed)
Summary: [facility]  [facility]   Imported By: Lanelle Bal 02/09/2010 11:16:22  _____________________________________________________________________  External Attachment:    Type:   Image     Comment:   External Document

## 2010-07-08 NOTE — Consult Note (Signed)
Summary: [facility]   Imported By: Lanelle Bal 03/08/2010 12:02:53  _____________________________________________________________________  External Attachment:    Type:   Image     Comment:   External Document

## 2010-07-08 NOTE — Progress Notes (Signed)
Summary: Order labs  Phone Note From Other Clinic Call back at [phone] direct to Tammy   Caller: Tammy @ [employer] of Call: Tammy at [facility] works with Dr. Roxy Horseman (?) and needs patient to have BUN and creatinine labs.  Patient wants to come to [facility] lab on 06/04/10.  Please order or call Tammy to let her know that it cannot be done at [facility]. Initial call taken by: Leonette Monarch,  May 26, 2010 11:43 AM  Follow-up for Phone Call        spoke with MR and he is ok with ordering these labs for the pt. Order has been placed and pt is aware. [name] CMA  May 26, 2010 12:52 PM
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
## 2010-07-08 NOTE — Consult Note (Signed)
Summary: [facility]  [facility]   Imported By: Lester Arapahoe 07/30/2009 07:42:59  _____________________________________________________________________  External Attachment:    Type:   Image     Comment:   External Document
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
## 2010-07-08 NOTE — Letter (Signed)
Summary: [facility]   Imported By: Sherian Rein 10/19/2009 13:33:25  _____________________________________________________________________  External Attachment:    Type:   Image     Comment:   External Document

## 2010-07-08 NOTE — Progress Notes (Signed)
Summary: breathing problem  Phone Note Call from Patient Call back at [phone]   Caller: Patient Call For: Tyreona Panjwani Summary of Call: pt would like to see dr Marchelle Gearing for breathing problem. he does not want to see another dr. Initial call taken by: Rickard Patience,  December 22, 2009 9:48 AM  Follow-up for Phone Call        lmomtcb for pt on home number.  tried the other number left but no answer.  will make appt on friday for pt Randell Loop [facility]  December 22, 2009 10:37 AM    lmomtcb ---appt made to see MR on friday at 11:40.  will try pt back later. Randell Loop [facility]  December 22, 2009 4:41 PM   Additional Follow-up for Phone Call Additional follow up Details #1::        pt has been informed of appt date and time on friday with MR. Randell Loop [facility]  December 22, 2009 4:44 PM

## 2010-07-08 NOTE — Progress Notes (Signed)
Summary: abg test results/surgical clearance  Phone Note Call from Patient Call back at [phone]   Caller: Tammy w/ Dr. Fredia Sorrow Call For: Marchelle Gearing Reason for Call: Talk to Nurse Summary of Call: has pt been set up for ABG's - his treatment has been moved up to 08/14/2009. Initial call taken by: Eugene Gavia,  August 04, 2009 10:40 AM  Follow-up for Phone Call        called and spoke with tammy from Dr. Antonietta Jewel office.  Tammy requesting abg results from 07-30-2009 and also for MR to write on a prescription pad if pt is cleared for surgery or not.  fax this to [phone] attn tammy.  will forward message to MR.  Aundra Millet Reynolds LPN  August 05, 4538 10:46 AM   Additional Follow-up for Phone Call Additional follow up Details #1::        I faxed my office note from most recent vist with addendum to it to both Dr. Fredia Sorrow and Dr. Laverle Patter yesterday itself. Pls check if both offices received the fax. IT was made as an addendum to note from 07/17/2009. Thanks Additional Follow-up by: Kalman Shan MD,  August 04, 2009 12:07 PM    Additional Follow-up for Phone Call Additional follow up Details #2::    refaxed addendum from 07-17-2009 office note to Dr. Fredia Sorrow and Dr. Laverle Patter.Arman Filter LPN  August 04, 9809 12:34 PM
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
## 2010-07-08 NOTE — Progress Notes (Signed)
Summary: order  Phone Note Call from Patient Call back at [phone]   Caller:  dr Antonietta Jewel office  tammy Call For: [facility] Summary of Call: need order sent to lab for creat. and bun Initial call taken by: Rickard Patience,  September 14, 2009 2:06 PM  Follow-up for Phone Call        Tammy states pt needs a creatinine and BUN drawn for upcoming procedure. pt refuses to go to their lab because of past incidenc so he wants to come to our lab to have this drawn. our lab requires an order from a Lyon MD. Please advise if ok to order. I LMTCB with the pt to ask when he was going to come have labs drawn. tammy requests results be faxed to [phone] attn: Dr. Fredia Sorrow.  thanks. [name] CMA  September 14, 2009 3:52 PM  per MR ok to place order. Pt also needs to schedule follow-up appt.  I will have him set up while he is here. [name] CMA  September 14, 2009 4:04 PM

## 2010-07-08 NOTE — Letter (Signed)
Summary: [facility]  [facility]   Imported By: Sherian Rein 02/26/2010 15:16:56  _____________________________________________________________________  External Attachment:    Type:   Image     Comment:   External Document

## 2010-07-08 NOTE — Progress Notes (Signed)
Summary: order ABG  Phone Note Outgoing Call   Call placed by: [name] CMA,  July 24, 2009 4:59 PM Call placed to: Patient Summary of Call: Adrian West   Can you please call Mr Cary and have him do ABG test before his uro surgery. You can tell him that Dr. Laverle Patter called me adn we felt this test would help clear him for surgery   Thanks   Zamaya Rapaport  Follow-up for Phone Call        [facility] to ask pt when surgery is scheduled and to advise him of order placed for ABG. [name] CMA  July 24, 2009 5:00 PM  ATC unable to leave message, will retry later. [name] CMA  July 27, 2009 3:50 PM  Spoke with pt wife and she will have pt call to set up ABG. [name] CMA  July 28, 2009 5:03PM  LMTCB. [name] CMA  July 29, 2009 12:19 PM  pt called back.  Said call him @ [phone] and you can leave it on the VM. Follow-up by: Eugene Gavia,  July 29, 2009 4:29 PM  Additional Follow-up for Phone Call Additional follow up Details #1::        spoke with pt and advised him he needed an abg drawn. He will go to [facility] tomorrow morning. Order has been faxed to [facility] [phone]. [name] CMA  July 29, 2009 5:15 PM

## 2010-07-08 NOTE — Letter (Signed)
Summary: [employer] [facility] Pulmonary  [address]   [place], [place] [address]   Phone: [phone]  Fax: [phone]    11/13/2009  [facility] [address] [place], [place]  [address]  Dear Mr. MITTLEMAN,    We have been attempting to contact you in regards to a missed appointment from 11-04-09. Please contact our office at your earliest convenience to discuss the matter. Thank you.         Sincerely,  Nature conservation officer Pulmonary Division

## 2010-07-08 NOTE — Miscellaneous (Signed)
Summary: ct and echo report  Clinical Lists Changes Per MR...plse set him up for chest ct and get his echo repiort from Dr Patty Sermons. Pt scheduled for CT chest on 07/07/09. I have called an requested Echo report from dr. Patty Sermons. it is being sent to traige fax.[name] CMA  July 07, 2009 2:03 PM

## 2010-07-08 NOTE — Progress Notes (Signed)
Summary: test results  Phone Note Call from Patient Call back at Home Phone [phone]   Caller: Patient Call For: [name] Summary of Call: Pt wants to know if MR has received his stress rest results, and if so he wants an appt to discuss it with him. Initial call taken by: Darletta Moll,  September 17, 2009 2:37 PM  Follow-up for Phone Call        Please advise if you have read stress test results yet, thanks Adrian West  September 17, 2009 2:46 PM  Per MR pt needs appt to discuss. I LMTCB to schedule pt. [name] CMA  September 17, 2009 4:44 PM  spoke with pt wife and advised pt needs a ppt to review results. I set pt for appt tomorrow 09/22/09.[name] CMA  September 21, 2009 4:46 PM
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
## 2010-07-08 NOTE — Letter (Signed)
Summary: [facility]  [facility]   Imported By: Lester [place] 07/30/2009 07:35:58  _____________________________________________________________________  External Attachment:    Type:   Image     Comment:   External Document

## 2010-07-08 NOTE — Progress Notes (Signed)
Summary: needs dexa  Phone Note Outgoing Call   Summary of Call: Hi Hop, Could you please order a DEXA scan for him and followup. [facility] pulmonary thinks he might have loss of height from osteoporosis. Vit D was normal. Thanks, MR Initial call taken by: Kalman Shan MD,  March 16, 2010 7:25 PM  Follow-up for Phone Call        DEXA SCH'D FOR 03-31-2010 & PATIENT AWARE. Follow-up by: Magdalen Spatz [facility],  March 19, 2010 1:39 PM  New Problems: LOSS OF HEIGHT (ICD-781.91)   New Problems: LOSS OF HEIGHT (ICD-781.91)

## 2010-07-08 NOTE — Letter (Signed)
Summary: CPST- R/O Contraindications  [facility]  [address]   [place], [place] [address]   Phone: [phone]  Fax: [phone]    Patient's Name: Adrian West Date of Birth: April 09, 1928 MRN: [id number]  *********Rule out Contraindications**************** Absolute                                                                                                                           ___ Acute MI (3-5 Days)                                 ___ Unstable Angina                                          ___ Uncontrolled arrhythmias causing symptoms or hemodynamic compromise. ___ Syncope                                                     ___ Active endocarditis                                         ___ Acute Myocarditis/Pericarditis                        ___ Symptomatic severe aortic stenosis  ___ Acute Pulmonary embolus or pulmonary infarction                ___ Uncontrolled Heart Failure  ___ Thrombosis of lower extremitie ___ Suspected dissecting aneurysm ___ Uncontrolled Asthma                          ___ Pulmonary Edema                                        ___ RA desat @ rest<85%                                      ___ Repiratory Failure                                         ___ Acute noncardiopulmonary disorder that may affect exercise performance or be         aggravated by exercise (ie infection, renal failure,  thyrotoxicosis) .                               ___ Mental impairment leading to inabliity to cooperate   Relative ___ Left main coronary stenosis or its equivalent ___ Moderate stentoic valvular heart disease ___ Severe untreated arterial hypertension @ rest (<200 mmHg             systolic,>132mmHg Diastolic ___ Tachy/Brady Arrhythmias ___ High- degree artioventricular block ___ Hypertrophic cardiomyopathy ___ Significant pulmonary hypertension ___ Advanced or complicated pregnancy ___ Electrolyte abnormalities ___ Orthopedic impairment  that compromises exercise performance   no cONTRAINDICATIONS   Kalman Shan MD    [facility]

## 2010-07-08 NOTE — Progress Notes (Signed)
Summary: LETTER  Phone Note Call from Patient   Caller: Patient Call For: Hulet Ehrmann Summary of Call: left letter for DR [facility] GAVE TO JENNIFER Initial call taken by: Rickard Patience,  July 08, 2009 11:45 AM  Follow-up for Phone Call        letter placed in MR look-at.[name] CMA  July 08, 2009 11:50 AM

## 2010-07-19 ENCOUNTER — Telehealth: Payer: Self-pay | Admitting: Internal Medicine

## 2010-07-22 NOTE — Letter (Signed)
Summary: Current Health Report from Patient  Current Health Report from Patient   Imported By: Lanelle Bal 07/14/2010 09:06:16  _____________________________________________________________________  External Attachment:    Type:   Image     Comment:   External Document

## 2010-07-28 ENCOUNTER — Ambulatory Visit (INDEPENDENT_AMBULATORY_CARE_PROVIDER_SITE_OTHER): Payer: Medicare Other | Admitting: Internal Medicine

## 2010-07-28 ENCOUNTER — Encounter: Payer: Self-pay | Admitting: Internal Medicine

## 2010-07-28 DIAGNOSIS — M48061 Spinal stenosis, lumbar region without neurogenic claudication: Secondary | ICD-10-CM

## 2010-07-28 HISTORY — DX: Spinal stenosis, lumbar region without neurogenic claudication: M48.061

## 2010-07-28 NOTE — Progress Notes (Signed)
Summary: pls call patient  Phone Note Outgoing Call   Summary of Call: JEnnifer, Pls tell him that I received his letter dated 06/26/2010.  In that letter he states one option is to send him to [facility] but I already sent hiim there. HE also asks abot [place] [facility] but he declined that in July 2011. If he wants I can send him there. Please check if rehab has helped him at all. One option is to re-test CPST on him and I can discuss it with him over phone Initial call taken by: Kalman Shan MD,  July 19, 2010 1:12 AM  Follow-up for Phone Call        Spoke withthe pt and offered recs per MR, pt requested to come in for an appt to discuss issues face to face. Pt set to see MR on 08-03-10 at 3:50. Pt set for a slot because of extensive history.[name] CMA  July 20, 2010 12:18 PM

## 2010-07-28 NOTE — Letter (Signed)
Summary: Letter from patient  Letter from patient   Imported By: Lester [place]and[place] 07/23/2010 10:22:57  _____________________________________________________________________  External Attachment:    Type:   Image     Comment:   External Document

## 2010-08-03 ENCOUNTER — Ambulatory Visit (INDEPENDENT_AMBULATORY_CARE_PROVIDER_SITE_OTHER): Payer: Medicare Other | Admitting: Internal Medicine

## 2010-08-03 ENCOUNTER — Encounter: Payer: Self-pay | Admitting: Internal Medicine

## 2010-08-03 DIAGNOSIS — R0609 Other forms of dyspnea: Secondary | ICD-10-CM
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
## 2010-08-11 ENCOUNTER — Encounter: Payer: Self-pay | Admitting: Internal Medicine

## 2010-08-11 ENCOUNTER — Other Ambulatory Visit (HOSPITAL_COMMUNITY): Payer: Self-pay | Admitting: Urology

## 2010-08-11 ENCOUNTER — Ambulatory Visit (HOSPITAL_COMMUNITY)
Admission: RE | Admit: 2010-08-11 | Discharge: 2010-08-11 | Disposition: A | Discharge status: Home / Self Care | Payer: Medicare Other | Source: Ambulatory Visit | Attending: Urology | Admitting: Urology

## 2010-08-11 DIAGNOSIS — M47817 Spondylosis without myelopathy or radiculopathy, lumbosacral region: Secondary | ICD-10-CM | POA: Insufficient documentation

## 2010-08-11 DIAGNOSIS — D4959 Neoplasm of unspecified behavior of other genitourinary organ: Secondary | ICD-10-CM | POA: Insufficient documentation

## 2010-08-11 DIAGNOSIS — D49519 Neoplasm of unspecified behavior of unspecified kidney: Secondary | ICD-10-CM

## 2010-08-12 NOTE — Progress Notes (Signed)
Summary: List of Concerns Brought by Patient  List of Concerns Brought by Patient   Imported By: Lanelle Bal 08/03/2010 13:54:12  _____________________________________________________________________  External Attachment:    Type:   Image     Comment:   External Document
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
## 2010-08-24 NOTE — Letter (Signed)
Summary: [facility]  [facility]   Imported By: Maryln Gottron 08/19/2010 10:03:18  _____________________________________________________________________  External Attachment:    Type:   Image     Comment:   External Document

## 2010-08-25 ENCOUNTER — Other Ambulatory Visit: Payer: Self-pay | Admitting: *Deleted

## 2010-08-25 DIAGNOSIS — I4891 Unspecified atrial fibrillation: Secondary | ICD-10-CM

## 2010-08-25 DIAGNOSIS — E039 Hypothyroidism, unspecified: Secondary | ICD-10-CM

## 2010-08-25 LAB — BLOOD GAS, ARTERIAL
Acid-Base Excess: 3.7 mmol/L — ABNORMAL HIGH (ref 0.0–2.0)
Bicarbonate: 27.4 mEq/L — ABNORMAL HIGH (ref 20.0–24.0)
FIO2: 0.21 %
O2 Saturation: 96.9 %
pCO2 arterial: 39.8 mmHg (ref 35.0–45.0)
pO2, Arterial: 80.4 mmHg (ref 80.0–100.0)

## 2010-08-25 MED ORDER — LEVOTHYROXINE SODIUM 125 MCG PO TABS: 125.0000 ug | ORAL_TABLET | Freq: Every day | ORAL | Status: DC

## 2010-08-25 MED ORDER — DIGOXIN 125 MCG PO TABS: 125.0000 ug | ORAL_TABLET | Freq: Every day | ORAL | Status: DC

## 2010-08-25 NOTE — Telephone Encounter (Signed)
Refilled meds per fax request.  

## 2010-08-29 LAB — BASIC METABOLIC PANEL
BUN: 14 mg/dL (ref 6–23)
CO2: 26 mEq/L (ref 19–32)
Chloride: 106 mEq/L (ref 96–112)
Chloride: 107 mEq/L (ref 96–112)
GFR calc Af Amer: >60 mL/min (ref >60)
GFR calc non Af Amer: >60 mL/min (ref >60)
Glucose, Bld: 85 mg/dL (ref 70–99)
Potassium: 4.5 mEq/L (ref 3.5–5.1)
Sodium: 140 mEq/L (ref 135–145)

## 2010-08-29 LAB — CBC
HCT: 50 % (ref 39.0–52.0)
Hemoglobin: 14.9 g/dL (ref 13.0–17.0)
MCHC: 33.1 g/dL (ref 30.0–36.0)
MCHC: 33.4 g/dL (ref 30.0–36.0)
MCV: 95 fL (ref 78.0–100.0)
MCV: 96 fL (ref 78.0–100.0)
Platelets: 174 10*3/uL (ref 150–400)
RBC: 4.65 MIL/uL (ref 4.22–5.81)
RDW: 14.7 % (ref 11.5–15.5)
WBC: 11 10*3/uL — ABNORMAL HIGH (ref 4.0–10.5)

## 2010-08-29 LAB — CROSSMATCH

## 2010-09-04 ENCOUNTER — Encounter: Payer: Self-pay | Admitting: Internal Medicine

## 2010-09-06 ENCOUNTER — Encounter: Payer: Self-pay | Admitting: Internal Medicine

## 2010-09-06 ENCOUNTER — Ambulatory Visit (INDEPENDENT_AMBULATORY_CARE_PROVIDER_SITE_OTHER): Payer: Medicare Other | Admitting: Internal Medicine

## 2010-09-06 VITALS — BP 124/80 | HR 35 | Temp 98.1°F | Resp 14 | Ht 70.0 in | Wt 198.8 lb

## 2010-09-06 DIAGNOSIS — H9190 Unspecified hearing loss, unspecified ear: Secondary | ICD-10-CM

## 2010-09-06 DIAGNOSIS — Z0000 Encounter for general adult medical examination without abnormal findings: Secondary | ICD-10-CM

## 2010-09-06 NOTE — Patient Instructions (Addendum)
Please consider follow up of hearing deficit as we discussed.                                                                                             Preventive Health Care: Exercise  Up to 30-45 minutes a day,  3-4 days a week as lung condition allows.  Eat a low-fat diet with lots of fruits and vegetables, up to 7-9 servings per day. Avoid obesity; your goal is waist measurement < 40 inches.Consume less than 40 grams of sugar per day from foods & drinks with High Fructose Corn Sugar as #2,3 or # 4 on label. Seatbelts can save your life. Wear them always. Eye Doctor - have an eye exam @ least annually.                                                                                     Alcohol If you drink, do it moderately,less than 9 drinks per week, preferably less than 6 @ most. Health Care Power of Attorney & Living Will. Complete if not in place ; these place you in charge of your health care decisions. Depression is common in our stressful world.If you're feeling down or losing interest in things you normally enjoy, please call .

## 2010-09-06 NOTE — Progress Notes (Signed)
Subjective:    Patient ID: Adrian West, male    DOB: 20-Jul-1927, 77 y.o.   MRN: [id number]  HPI  Medicare Wellness Visit:  The following psychosocial & medical history were reviewed as required by Medicare.    social history including caffeine, alcohol,  tobacco use & exercise:decaffeinated coffee only; 1-2 alcoholic beverages daily;never smoked;exercise as golf, treadmill, machines - 5 3X/week for > 60 min   home safety, activities of daily living, seatbelt use , and smoke alarm employment : no safety issues but intermittent vertigo (Dr Sandria Manly, Neuro); seat belt & alarms employed;no ADL limitations but exertional dyspnea due to diaphragm paralysis   power of attorney/living will status : in place   hearing and vision evaluation :decreased acuity to whisper @ 6 ft. He will consult [facility]( his wife has been seen there)   orientation, memory and mental health assessment : orientation X3; memory & recall normal; WORLD spelled backwards;mood & affect normal   travel history, immunization status , transfusion history, and preventive health surveillance assessment : preventive care up to date;[place] 2000; no PMH of transfusions/   pertinent positives and negative items include: as noted above.They will be entering [facility]     Review of Systems Patient reports no  vision/ hearing changes,anorexia, weight change, fever ,adenopathy, persistant / recurrent hoarseness, swallowing issues, chest pain,palpitations, edema,persistant / recurrent cough, hemoptysis, gastrointestinal  bleeding (melena, rectal bleeding), abdominal pain, excessive heart burn, GU symptoms( dysuria, hematuria, pyuria, voiding/incontinence  Issues) syncope, focal weakness, memory loss, skin/hair/nail changes,depression, anxiety, abnormal bruising/bleeding.Musculoskeletal symptoms:back pain due to curvature. Must use golf cart due to DOE in context of diaphragm injury .  Neuropathy in legs causes cold sensation &  tingling. Numbness RUE due to cervical DDD.Benign intention tremor LUE.  Physical Exam Pattern alopecia; appears well nourished. Keratotic skin lesions diffusely.Eye - Pupils Equal Round Reactive to light,Fundi without hemorrhage or visible lesions, Conjunctiva without redness or discharge.   Ears:  External ear exam shows no significant lesions or deformities.  Otoscopic examination reveals clear canals, tympanic membranes are intact bilaterally without bulging, retraction, inflammation or discharge. Hearing as noted.Neck:  No deformities, thyromegaly, masses, or tenderness noted.   Supple with full range of motion without pain. Heart:  Normal rate and regular rhythm. S1 and S2 normal without gallop, murmur, click, rub.S4 ; occasional premature beat.  Lungs:Chest clear to auscultation; no wheezes, rhonchi,rales ,or rubs present.No increased work of breathing. Abdomen: bowel sounds normal, soft and non-tender without masses, organomegaly or hernias noted.  No guarding or rebound Neurologic exam :Strength equal & normal in upper & lower extremities; tremor present of L hand.Psych:  Cognition and judgment appear intact. Alert, communicative  and cooperative with normal attention span and concentration.GU as per Dr Laverle Patter.  Assessment & Plan:  #1 Medicare Wellness Exam  Plan: form for [facility] completed; no contraindication to independent living

## 2010-09-07 ENCOUNTER — Encounter: Payer: Self-pay | Admitting: Internal Medicine

## 2010-09-13 LAB — COMPREHENSIVE METABOLIC PANEL
ALT: 13 U/L (ref 0–53)
ALT: 37 U/L (ref 0–53)
AST: 19 U/L (ref 0–37)
Alkaline Phosphatase: 60 U/L (ref 39–117)
Alkaline Phosphatase: 60 U/L (ref 39–117)
BUN: 15 mg/dL (ref 6–23)
CO2: 17 mEq/L — ABNORMAL LOW (ref 19–32)
CO2: 33 mEq/L — ABNORMAL HIGH (ref 19–32)
Calcium: 9.1 mg/dL (ref 8.4–10.5)
Chloride: 106 mEq/L (ref 96–112)
Chloride: 98 mEq/L (ref 96–112)
GFR calc Af Amer: >60 mL/min (ref >60)
GFR calc non Af Amer: >60 mL/min (ref >60)
Glucose, Bld: 102 mg/dL — ABNORMAL HIGH (ref 70–99)
Glucose, Bld: 126 mg/dL — ABNORMAL HIGH (ref 70–99)
Potassium: 3.8 mEq/L (ref 3.5–5.1)
Potassium: 4.7 mEq/L (ref 3.5–5.3)
Sodium: 136 mEq/L (ref 135–145)
Sodium: 137 mEq/L (ref 135–145)
Total Bilirubin: 0.7 mg/dL (ref 0.3–1.2)
Total Bilirubin: 0.8 mg/dL (ref 0.3–1.2)
Total Protein: 5.4 g/dL — ABNORMAL LOW (ref 6.0–8.3)

## 2010-09-13 LAB — POCT I-STAT 3, ART BLOOD GAS (G3+)
Acid-Base Excess: 1 mmol/L (ref 0.0–2.0)
Bicarbonate: 24.1 mEq/L — ABNORMAL HIGH (ref 20.0–24.0)
Bicarbonate: 25.9 mEq/L — ABNORMAL HIGH (ref 20.0–24.0)
Bicarbonate: 29.3 mEq/L — ABNORMAL HIGH (ref 20.0–24.0)
O2 Saturation: 100 %
O2 Saturation: 99 %
Patient temperature: 35.1
TCO2: 23 mmol/L (ref 0–100)
TCO2: 25 mmol/L (ref 0–100)
TCO2: 31 mmol/L (ref 0–100)
pCO2 arterial: 35.2 mmHg (ref 35.0–45.0)
pCO2 arterial: 42.6 mmHg (ref 35.0–45.0)
pCO2 arterial: 49.4 mmHg — ABNORMAL HIGH (ref 35.0–45.0)
pCO2 arterial: 49.5 mmHg — ABNORMAL HIGH (ref 35.0–45.0)
pH, Arterial: 7.36 (ref 7.350–7.450)
pH, Arterial: 7.38 (ref 7.350–7.450)
pH, Arterial: 7.38 (ref 7.350–7.450)
pH, Arterial: 7.412 (ref 7.350–7.450)

## 2010-09-13 LAB — GLUCOSE, CAPILLARY
Glucose-Capillary: 120 mg/dL — ABNORMAL HIGH (ref 70–99)
Glucose-Capillary: 123 mg/dL — ABNORMAL HIGH (ref 70–99)
Glucose-Capillary: 130 mg/dL — ABNORMAL HIGH (ref 70–99)
Glucose-Capillary: 136 mg/dL — ABNORMAL HIGH (ref 70–99)

## 2010-09-13 LAB — POCT I-STAT 4, (NA,K, GLUC, HGB,HCT)
Glucose, Bld: 134 mg/dL — ABNORMAL HIGH (ref 70–99)
Glucose, Bld: 96 mg/dL (ref 70–99)
Glucose, Bld: 98 mg/dL (ref 70–99)
HCT: 28 % — ABNORMAL LOW (ref 39.0–52.0)
HCT: 41 % (ref 39.0–52.0)
Hemoglobin: 13.9 g/dL (ref 13.0–17.0)
Hemoglobin: 13.9 g/dL (ref 13.0–17.0)
Hemoglobin: 14.6 g/dL (ref 13.0–17.0)
Hemoglobin: 9.5 g/dL — ABNORMAL LOW (ref 13.0–17.0)
Potassium: 3.5 mEq/L (ref 3.5–5.1)
Potassium: 3.5 mEq/L (ref 3.5–5.1)
Potassium: 4.2 mEq/L (ref 3.5–5.1)
Potassium: 5.2 mEq/L — ABNORMAL HIGH (ref 3.5–5.1)
Sodium: 135 mEq/L (ref 135–145)
Sodium: 139 mEq/L (ref 135–145)

## 2010-09-13 LAB — HEMOGLOBIN A1C: Hgb A1c MFr Bld: 5.3 % (ref 4.6–6.1)

## 2010-09-13 LAB — HEMOGLOBIN AND HEMATOCRIT, BLOOD
HCT: 32.5 % — ABNORMAL LOW (ref 39.0–52.0)
Hemoglobin: 11.3 g/dL — ABNORMAL LOW (ref 13.0–17.0)

## 2010-09-13 LAB — BASIC METABOLIC PANEL
BUN: 20 mg/dL (ref 6–23)
BUN: 23 mg/dL (ref 6–23)
BUN: 9 mg/dL (ref 6–23)
CO2: 28 mEq/L (ref 19–32)
Calcium: 7.9 mg/dL — ABNORMAL LOW (ref 8.4–10.5)
Calcium: 8.1 mg/dL — ABNORMAL LOW (ref 8.4–10.5)
Chloride: 103 mEq/L (ref 96–112)
Creatinine, Ser: 0.88 mg/dL (ref 0.4–1.5)
Creatinine, Ser: 1.07 mg/dL (ref 0.4–1.5)
Creatinine, Ser: 1.19 mg/dL (ref 0.4–1.5)
GFR calc non Af Amer: >60 mL/min (ref >60)
GFR calc non Af Amer: >60 mL/min (ref >60)
Glucose, Bld: 118 mg/dL — ABNORMAL HIGH (ref 70–99)
Glucose, Bld: 121 mg/dL — ABNORMAL HIGH (ref 70–99)
Sodium: 133 mEq/L — ABNORMAL LOW (ref 135–145)

## 2010-09-13 LAB — CBC
HCT: 33.4 % — ABNORMAL LOW (ref 39.0–52.0)
HCT: 33.9 % — ABNORMAL LOW (ref 39.0–52.0)
Hemoglobin: 16 g/dL (ref 13.0–17.0)
MCHC: 33.7 g/dL (ref 30.0–36.0)
MCHC: 34.1 g/dL (ref 30.0–36.0)
MCHC: 34.4 g/dL (ref 30.0–36.0)
MCHC: 34.7 g/dL (ref 30.0–36.0)
MCV: 96.5 fL (ref 78.0–100.0)
Platelets: 82 10*3/uL — ABNORMAL LOW (ref 150–400)
Platelets: 85 10*3/uL — ABNORMAL LOW (ref 150–400)
Platelets: 86 10*3/uL — ABNORMAL LOW (ref 150–400)
RBC: 4.06 MIL/uL — ABNORMAL LOW (ref 4.22–5.81)
RBC: 4.97 MIL/uL (ref 4.22–5.81)
RDW: 13.2 % (ref 11.5–15.5)
RDW: 13.5 % (ref 11.5–15.5)
WBC: 7.5 10*3/uL (ref 4.0–10.5)
WBC: 8 10*3/uL (ref 4.0–10.5)
WBC: 8.2 10*3/uL (ref 4.0–10.5)
WBC: 8.9 10*3/uL (ref 4.0–10.5)

## 2010-09-13 LAB — PROTIME-INR
INR: 1.1 (ref 0.00–1.49)
INR: 1.4 (ref 0.00–1.49)
INR: 1.4 (ref 0.00–1.49)
Prothrombin Time: 14.5 seconds (ref 11.6–15.2)
Prothrombin Time: 16.2 seconds — ABNORMAL HIGH (ref 11.6–15.2)
Prothrombin Time: 17.2 seconds — ABNORMAL HIGH (ref 11.6–15.2)
Prothrombin Time: 17.8 seconds — ABNORMAL HIGH (ref 11.6–15.2)

## 2010-09-13 LAB — CREATININE, SERUM
Creatinine, Ser: 1.15 mg/dL (ref 0.4–1.5)
Creatinine, Ser: 1 mg/dL (ref 0.4–1.5)
GFR calc non Af Amer: >60 mL/min (ref >60)
GFR calc non Af Amer: >60 mL/min (ref >60)

## 2010-09-13 LAB — BLOOD GAS, ARTERIAL
Bicarbonate: 22.8 mEq/L (ref 20.0–24.0)
FIO2: 0.21 %
O2 Saturation: 97.5 %
pCO2 arterial: 31.9 mmHg — ABNORMAL LOW (ref 35.0–45.0)
pO2, Arterial: 86.2 mmHg (ref 80.0–100.0)

## 2010-09-13 LAB — URINALYSIS, ROUTINE W REFLEX MICROSCOPIC
Bilirubin Urine: NEGATIVE
Glucose, UA: NEGATIVE mg/dL
Hgb urine dipstick: NEGATIVE
Specific Gravity, Urine: 1.005 (ref 1.005–1.030)
Urobilinogen, UA: 0.2 mg/dL (ref 0.0–1.0)
pH: 5.5 (ref 5.0–8.0)

## 2010-09-13 LAB — TYPE AND SCREEN
ABO/RH(D): A NEG
Antibody Screen: NEGATIVE

## 2010-09-13 LAB — POCT I-STAT, CHEM 8
Calcium, Ion: 1.16 mmol/L (ref 1.12–1.32)
Chloride: 101 mEq/L (ref 96–112)
Creatinine, Ser: 1.2 mg/dL (ref 0.4–1.5)
Glucose, Bld: 126 mg/dL — ABNORMAL HIGH (ref 70–99)
HCT: 34 % — ABNORMAL LOW (ref 39.0–52.0)

## 2010-09-13 LAB — PREPARE FRESH FROZEN PLASMA

## 2010-09-13 LAB — POCT I-STAT GLUCOSE
Glucose, Bld: 104 mg/dL — ABNORMAL HIGH (ref 70–99)
Operator id: 3406

## 2010-09-13 LAB — PLATELET COUNT: Platelets: 136 10*3/uL — ABNORMAL LOW (ref 150–400)

## 2010-09-13 LAB — MAGNESIUM: Magnesium: 2.4 mg/dL (ref 1.5–2.5)

## 2010-09-14 LAB — POCT I-STAT 3, ART BLOOD GAS (G3+)
Acid-base deficit: 1 mmol/L (ref 0.0–2.0)
Acid-base deficit: 2 mmol/L (ref 0.0–2.0)
Bicarbonate: 23.9 mEq/L (ref 20.0–24.0)
O2 Saturation: 93 %
TCO2: 24 mmol/L (ref 0–100)
pCO2 arterial: 39.8 mmHg (ref 35.0–45.0)
pH, Arterial: 7.366 (ref 7.350–7.450)
pO2, Arterial: 56 mmHg — ABNORMAL LOW (ref 80.0–100.0)
pO2, Arterial: 70 mmHg — ABNORMAL LOW (ref 80.0–100.0)

## 2010-09-14 LAB — POCT I-STAT 3, VENOUS BLOOD GAS (G3P V)
Bicarbonate: 26 mEq/L — ABNORMAL HIGH (ref 20.0–24.0)
pCO2, Ven: 45.8 mmHg (ref 45.0–50.0)
pO2, Ven: 39 mmHg (ref 30.0–45.0)

## 2010-09-17 ENCOUNTER — Encounter: Payer: Self-pay | Admitting: Internal Medicine

## 2010-09-20 ENCOUNTER — Other Ambulatory Visit (INDEPENDENT_AMBULATORY_CARE_PROVIDER_SITE_OTHER): Payer: Medicare Other

## 2010-09-20 DIAGNOSIS — E785 Hyperlipidemia, unspecified: Secondary | ICD-10-CM

## 2010-09-20 DIAGNOSIS — T887XXA Unspecified adverse effect of drug or medicament, initial encounter: Secondary | ICD-10-CM

## 2010-09-20 DIAGNOSIS — E039 Hypothyroidism, unspecified: Secondary | ICD-10-CM

## 2010-09-20 DIAGNOSIS — K219 Gastro-esophageal reflux disease without esophagitis: Secondary | ICD-10-CM

## 2010-09-20 LAB — CBC WITH DIFFERENTIAL/PLATELET
Basophils Absolute: 0 10*3/uL (ref 0.0–0.1)
Eosinophils Absolute: 0.1 10*3/uL (ref 0.0–0.7)
HCT: 48.2 % (ref 39.0–52.0)
Hemoglobin: 16 g/dL (ref 13.0–17.0)
Lymphs Abs: 3.3 10*3/uL (ref 0.7–4.0)
MCHC: 33.1 g/dL (ref 30.0–36.0)
MCV: 98 fl (ref 78.0–100.0)
Monocytes Absolute: 0.3 10*3/uL (ref 0.1–1.0)
Monocytes Relative: 4.5 % (ref 3.0–12.0)
Neutro Abs: 2.2 10*3/uL (ref 1.4–7.7)
RDW: 13.6 % (ref 11.5–14.6)

## 2010-09-20 LAB — HEPATIC FUNCTION PANEL
AST: 20 U/L (ref 0–37)
Alkaline Phosphatase: 48 U/L (ref 39–117)
Bilirubin, Direct: 0.2 mg/dL (ref 0.0–0.3)
Total Bilirubin: 1 mg/dL (ref 0.3–1.2)

## 2010-09-20 LAB — LIPID PANEL
Total CHOL/HDL Ratio: 4
VLDL: 17.8 mg/dL (ref 0.0–40.0)

## 2010-09-24 ENCOUNTER — Other Ambulatory Visit: Payer: Self-pay | Admitting: *Deleted

## 2010-09-24 DIAGNOSIS — E039 Hypothyroidism, unspecified: Secondary | ICD-10-CM

## 2010-10-01 ENCOUNTER — Telehealth: Payer: Self-pay | Admitting: Nurse Practitioner

## 2010-10-01 ENCOUNTER — Other Ambulatory Visit (INDEPENDENT_AMBULATORY_CARE_PROVIDER_SITE_OTHER): Payer: Medicare Other | Admitting: *Deleted

## 2010-10-01 DIAGNOSIS — E039 Hypothyroidism, unspecified: Secondary | ICD-10-CM

## 2010-10-01 NOTE — Telephone Encounter (Signed)
Adrian West was here in the office this morning to have his labs drawn. The table which he placed his arm on fell. He suffered an approximate 3 inch abrasion. His pants were cut as well. Pressure was applied. It appears superficial and does not need stitches. We will confirm his last tetanus shot. He is to clean the wound when he gets home and apply neosporin ointment.

## 2010-10-04 NOTE — Telephone Encounter (Signed)
Called patient again today to follow up, [place] 4[place]27 [place].  Unable to reach, but left message if any problems to call.  Did check on last tetanus, and it was 08/17/2005 per epic.

## 2010-10-05 ENCOUNTER — Telehealth: Payer: Self-pay | Admitting: Internal Medicine

## 2010-10-05 DIAGNOSIS — G459 Transient cerebral ischemic attack, unspecified: Secondary | ICD-10-CM

## 2010-10-05 HISTORY — DX: Transient cerebral ischemic attack, unspecified: G45.9

## 2010-10-05 NOTE — Telephone Encounter (Signed)
lmomtcb x1 

## 2010-10-06 ENCOUNTER — Telehealth: Payer: Self-pay | Admitting: *Deleted

## 2010-10-06 NOTE — Telephone Encounter (Signed)
lmomtcb x 2  

## 2010-10-06 NOTE — Telephone Encounter (Signed)
Adv pt voicemail lab ok

## 2010-10-07 NOTE — Telephone Encounter (Signed)
lmomtcb x 3  

## 2010-10-08 NOTE — Telephone Encounter (Signed)
lmomtcb x4. Left message stating we have been trying to contact them for several days and have not heard back from them. If they needed [place] for anything further to call us back. Will sing off message since we have tried x 4 to contact pt

## 2010-10-11 ENCOUNTER — Telehealth: Payer: Self-pay | Admitting: Cardiology

## 2010-10-11 ENCOUNTER — Encounter: Payer: Self-pay | Admitting: Internal Medicine

## 2010-10-11 ENCOUNTER — Encounter: Payer: Self-pay | Admitting: Nurse Practitioner

## 2010-10-11 ENCOUNTER — Ambulatory Visit (INDEPENDENT_AMBULATORY_CARE_PROVIDER_SITE_OTHER): Payer: Medicare Other | Admitting: Internal Medicine

## 2010-10-11 DIAGNOSIS — I493 Ventricular premature depolarization: Secondary | ICD-10-CM

## 2010-10-11 DIAGNOSIS — Z136 Encounter for screening for cardiovascular disorders: Secondary | ICD-10-CM

## 2010-10-11 DIAGNOSIS — R4789 Other speech disturbances: Secondary | ICD-10-CM

## 2010-10-11 DIAGNOSIS — I4949 Other premature depolarization: Secondary | ICD-10-CM

## 2010-10-11 DIAGNOSIS — R4781 Slurred speech: Secondary | ICD-10-CM

## 2010-10-11 DIAGNOSIS — G459 Transient cerebral ischemic attack, unspecified: Secondary | ICD-10-CM

## 2010-10-11 DIAGNOSIS — R4182 Altered mental status, unspecified: Secondary | ICD-10-CM

## 2010-10-11 MED ORDER — CLOPIDOGREL BISULFATE 75 MG PO TABS: 75.0000 mg | ORAL_TABLET | Freq: Every day | ORAL | Status: DC

## 2010-10-11 NOTE — Patient Instructions (Addendum)
Keep referral appts as scheduled. Stop Nexium ; take Ranitidine 150 mg twice a day as needed for heart burn

## 2010-10-11 NOTE — Telephone Encounter (Signed)
Patient called about 9:00 stating that he thought he had had a small stroke on 10/08/10.  Stated that he had change in speach and bad headache for about three hours.  Stated he did not go to the emergency room (he did not want to go).  Advised he needed to call Dr. Imagene Gurney (his neurologist) office to be seen today.  He stated he wanted see Dr. Patty Sermons.  Advised he was out of the office this week and that he needed to see a neurologist for this type of thing.  He continued to say he wanted to be seen here, discussed with Lawson Fiscal and she also felt needed to see a neurologist.  Advised patient he needed to call Dr. Imagene Gurney office to be seen by him.  Tried to call patient back about an hour after speaking to him to make sure he was going to be seen.  Did have to leave a message asking him to call back.

## 2010-10-11 NOTE — Progress Notes (Signed)
Subjective:    Patient ID: Adrian West, male    DOB: 08-Jan-1928, 77 y.o.   MRN: [id number]  HPI Aphasia  :  Onset: 09/08/2010 about 12 noon   . Context: position change : he had been standing for 5 minutes ;straining:no; pain: no .   Duration : speech garbled for 3 hours  .  Cardiac Prodrome: heart racing/ heart irregularity/ palpitations : no  .  Neuro Prodrome:headache:nt initially  But 30 minutes later; numbness & tingling : no change beyond baseline ( see below)  ; weakness : no.  Vertigo:PMH of but  not @ that time ;gait dysfunction/falling: no  ; tremor: no.  ROS: diaphoresis : no ;pleuritic chest pain: no ;  hemoptysis: no  ;   dyspnea: no increase over baseline from diaphragm issues .  Seizure activity: limb movement : no ;   urine / stool incontinence: no ; body injury:no.                                                                                                                                                HEADACHE   Onset: 30 min after aphasia began  Location: R frontal area Quality: sharp Precipitating factors: no food or other triggers Treatment:  none  Associated Symptoms Nausea/vomiting: no  Photophobia/phonophobia: no  Tearing of eyes: no  Sinus pain/pressure: no  Family hx migraine: no  Red Flags Fever: no  Neck pain/stiffness: no  Vision/swallow/hearing difficulty: no  Focal weakness/numbness: no  Altered mental status: no  New type of headache: yes  Anticoagulant use: no    Review of Systems He sees Dr Sandria Manly for Benign Essential Tremor, vertigo & "pinched nerve " in neck  With paresthesias of fingers of RUE.     Objective:   Physical ExamGen.: Healthy and well-nourished in appearance. Alert, appropriate and cooperative throughout exam. Head: Normocephalic without obvious abnormalities. Eyes: No corneal or conjunctival inflammation noted. Pupils equal round reactive to light and accommodation.  Extraocular motion intact. Field of Vision grossly  normal. Hearing is grossly normal bilaterally. Mouth: Oral mucosa and oropharynx reveal no lesions or exudates. Teeth in good repair. No tongue deviation. Neck: No deformities, masses, or tenderness noted. Range of motion  Normal. Lungs: Normal respiratory effort; chest expands symmetrically. Lungs are clear to auscultation without rales, wheezes, or increased work of breathing. Heart: Normal rate and rhythm. Normal S1 ; intermittently split  S2. No gallop, click, or rub.  No murmur. Vascular: Carotid, radial artery, dorsalis pedis and dorsalis posterior tibial pulses are full and equal. No bruits present. Neurologic: Alert and oriented x3. Deep tendon reflexes symmetrical and normal. Strength good in extremities. Cranial nerves WNL. Gait choppy & deliberate. Unsteady Romberg. Skin:solar changes. Lymph: No cervical, axillary, or inguinal lymphadenopathy present. Psych: Mood and affect are normal. 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  Carotid Dopplers  ;  possibly an event Holter may be necessary. These will be conducted through Drs. Remus Loffler and Dr. Patty Sermons.

## 2010-10-12 ENCOUNTER — Ambulatory Visit
Admission: RE | Admit: 2010-10-12 | Discharge: 2010-10-12 | Disposition: A | Discharge status: Home / Self Care | Payer: Medicare Other | Source: Ambulatory Visit | Attending: Internal Medicine | Admitting: Internal Medicine

## 2010-10-12 ENCOUNTER — Ambulatory Visit (INDEPENDENT_AMBULATORY_CARE_PROVIDER_SITE_OTHER): Payer: Medicare Other | Admitting: Nurse Practitioner

## 2010-10-12 ENCOUNTER — Other Ambulatory Visit: Payer: Self-pay | Admitting: Nurse Practitioner

## 2010-10-12 ENCOUNTER — Other Ambulatory Visit: Payer: Self-pay | Admitting: Cardiology

## 2010-10-12 ENCOUNTER — Telehealth: Payer: Self-pay | Admitting: *Deleted

## 2010-10-12 ENCOUNTER — Ambulatory Visit
Admission: RE | Admit: 2010-10-12 | Discharge: 2010-10-12 | Disposition: A | Discharge status: Home / Self Care | Payer: Medicare Other | Source: Ambulatory Visit | Attending: Nurse Practitioner | Admitting: Nurse Practitioner

## 2010-10-12 ENCOUNTER — Encounter: Payer: Self-pay | Admitting: Nurse Practitioner

## 2010-10-12 VITALS — BP 130/70 | HR 60 | Wt 195.0 lb

## 2010-10-12 DIAGNOSIS — G459 Transient cerebral ischemic attack, unspecified: Secondary | ICD-10-CM

## 2010-10-12 DIAGNOSIS — R4781 Slurred speech: Secondary | ICD-10-CM

## 2010-10-12 LAB — BASIC METABOLIC PANEL
Glucose, Bld: 84 mg/dL (ref 70–99)
Potassium: 4.6 mEq/L (ref 3.5–5.3)
Sodium: 135 mEq/L (ref 135–145)

## 2010-10-12 NOTE — Progress Notes (Signed)
Adrian West Date of Birth: [birth year]   History of Present Illness: Adrian West is seen today for a work in visit. He is seen for Dr. Patty Sermons. He had an episode this past Friday (4 days ago) of slurred and incoherent speech. He was standing in the driveway in [place] talking with his wife and 3 daughters. He had an "intense" right frontal headache. He did not seek medical attention. The symptoms resolved after about 3 hours and have not recurred. He denies any palpitations. He called here yesterday and Juliette Alcide encouraged him to go and see Dr. Sandria Manly. He went to Dr. Alwyn Ren instead. Carotid dopplers are scheduled for later today. He has chronic shortness of breath, no chest pain but otherwise has been at his baseline.   Current Outpatient Prescriptions on File Prior to Visit  Medication Sig Dispense Refill  . aspirin 325 MG tablet Take 81 mg by mouth daily.       . clopidogrel (PLAVIX) 75 MG tablet Take 1 tablet (75 mg total) by mouth daily.  30 tablet  1  . digoxin (LANOXIN) 0.125 MG tablet Take 1 tablet (125 mcg total) by mouth daily.  90 tablet  3  . FA-BENFOTIAMINE-PABA-E-LIPOIC PO Take by mouth daily. For Neuropathy of legs       . gabapentin (NEURONTIN) 100 MG capsule Take 600 mg by mouth at bedtime. 600mg  daily      . levothyroxine (SYNTHROID) 125 MCG tablet Take 1 tablet (125 mcg total) by mouth daily.  90 tablet  3  . Multiple Vitamin (MULTIVITAMIN) capsule Take 1 capsule by mouth daily.        . Multiple Vitamins-Minerals (PROTEGRA PO) Take by mouth daily.        . Omega-3 Fatty Acids (FISH OIL) 1000 MG CAPS Take 1,200 mg by mouth.       . vitamin B-12 (CYANOCOBALAMIN) 100 MCG tablet Take 1,000 mcg by mouth daily.         No Known Allergies  Past Medical History  Diagnosis Date  . Thyroid disease   . Hypertension   . GERD (gastroesophageal reflux disease)     PMH of stricture  . Benign neoplasm of colon   . Tremor, essential   . Neuropathy, peripheral   . Diverticulosis    . Skin cancer     Past Surgical History  Procedure Date  . Appendectomy   . Cataract extraction   . Cholecystectomy   . Inguinal hernia repair   . Lumbar laminectomy   . Nasal sinus surgery   . Tonsillectomy   . Esophageal dilation 2009    Dr Marina Goodell  . 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 Laparoscopic ablation renal mass 08/14/2009    History  Smoking status  . Never Smoker   Smokeless tobacco  . Not on file    History  Alcohol Use  . 10.2 oz/week  . 17 Glasses of wine per week    Family History  Problem Relation Age of Onset  . Heart attack Father 36  . Colon cancer Mother   . Heart attack Maternal Grandmother 60  . Uterine cancer Paternal Grandmother   . Stroke Paternal Uncle     Review of Systems: The review of systems is positive for chronic shortness of breath due to paralysis of his phrenic nerve.  All other systems were reviewed and are negative.  Physical Exam: BP 130/70  Pulse 60  Wt 195 lb (88.451 kg) Patient is very pleasant and in no acute distress. Skin is warm and dry. Color is normal.  HEENT is unremarkable. Normocephalic/atraumatic. PERRL. Sclera are nonicteric. Neck is supple. No masses. No JVD. Lungs are clear. Cardiac exam shows a regular rate and rhythm. No murmur noted. Abdomen is soft. Extremities are without edema. Gait and ROM are intact. No gross neurologic deficits noted. His grips are equal. Strength is symmetrical.   LABORATORY DATA:   Assessment / Plan:

## 2010-10-12 NOTE — Patient Instructions (Signed)
Stay on the Plavix. We are going to check an ultrasound of your heart and a CT scan on your head. I will have you follow up with Dr. Patty Sermons once we have your studies completed.   .Transient Ischemic Attack A transient ischemic attack (TIA) is a "warning stroke" that causes stroke-like symptoms. Unlike a stroke, a TIA does not cause permanent damage to the brain. The symptoms of a TIA can happen very fast and do not last long. It is important to know the symptoms of a TIA and what to do. This can help prevent a major stroke or death.  CAUSES  A TIA is caused by a temporary blockage in an artery in the brain or neck (carotid artery). The blockage does not allow the brain to get the blood supply it needs and can cause different symptoms. The blockage can be caused by either:   A blood clot.   Fatty buildup (plaque) in a neck or brain artery.  SYMPTOMS TIA symptoms are the same as a stroke but are temporary. Symptoms can include sudden:  Numbness or weakness on one side of the body. Especially to the:   Face.   Arm.   Leg.   Trouble speaking, thinking, or confusion.   Change in vision, such as trouble seeing in one or both eyes.   Dizziness, loss of balance, or difficulty walking.   Severe headache.  ANY OF THESE SYMPTOMS MAY REPRESENT A SERIOUS PROBLEM THAT IS AN EMERGENCY. Do not wait to see if the symptoms will go away. Get medical help at once. Call your local emergency service (911 in [place] IMMEDIATELY. DO NOT drive yourself to the hospital. TIA RISK FACTORS Risk factors can increase the risk of developing a TIA. These can include.   High blood pressure (hypertension).   High cholesterol (hyperlipidemia).   Heart disease (atherosclerosis).   Smoking.   Diabetes.   Abnormal heart rhythm (atrial fibrillation).   Family history of a stroke or heart attack.   Use of oral contraceptives (especially when combined with smoking).  DIAGNOSIS  A TIA can be diagnosed  based on your:   Symptoms.   History.   Risk factors.   Tests that can help diagnose the symptoms of a TIA include:   CT or MRI scan. These tests can provide detailed images of the brain.   Carotid ultrasound. This test looks to see if there are blockages in the carotid arteries of your neck.   Arteriography. A thin, small flexible tube (catheter) is inserted through a small cut (incision) in your groin. The catheter is threaded to your carotid or vertebral artery. A dye is then injected into the catheter. The dye highlights the arteries in your brain and allows your caregiver to look for narrowing or blockages that can cause a TIA.  TREATMENT Based on the cause of a TIA, treatment options can vary. Treatment is important to help prevent a stroke. Treatment options can include:  Medication. Such as:   Clot-busting medicine.   Anti-platelet medicine.   Blood pressure medicine.   Blood thinner medicine.   Surgery:   Carotid endarterectomy. The carotid arteries are the arteries that supply the head and neck with oxygenated blood. This surgery can help remove fatty deposits (plaque) in the carotid arteries.   Angioplasty and stenting. This surgery uses a balloon to dilate a blocked artery in the brain. A stent is a small, metal mesh tube that can help keep an artery open.  HOME CARE INSTRUCTIONS  It is important to take all medicine as told by your caregiver. If the medicine has side effects that affect you negatively, tell your caregiver right away. Do not stop taking medicine unless told by your caregiver. Some medicines may need to be changed to better treat your condition.   Do not smoke. Talk to your caregiver on how to quit smoking.   Eat a diet high in fruits, vegetables and lean meat. Avoid a high fat, high salt diet. A dietician can you help you make healthy food choices.   Maintain a healthy weight. Develop an exercise plan approved by your caregiver.  SEEK IMMEDIATE  MEDICAL CARE IF:  You develop weakness or numbness on one side of your body.   You have problems thinking, speaking, or feel confused.   You have vision changes.   You feel dizzy, have trouble walking, or lose your balance.   You develop a severe headache.  MAKE SURE YOU:  Understand these instructions.   Will watch your condition.   Will get help right away if you are not doing well or get worse.  Document Released: 03/02/2005 Document Re-Released: 08/17/2009 [facility] Patient Information 2011 [place], [place].

## 2010-10-12 NOTE — Assessment & Plan Note (Addendum)
He needs a CT scan of his head and an echo. He will be having his carotid doppler later today. He will more than likely need to see Dr. Sandria Manly in follow up. He is to continue his Plavix. His EKG from yesterday showed sinus. I don't think he has had recurrent atrial fib but we may need to place an event monitor in the future.  I told him that if he has recurrent symptoms he needs to call 911 and not "wait it out". Further disposition is to follow. Patient is agreeable to this plan and will call if any problems develop in the interim.

## 2010-10-12 NOTE — Telephone Encounter (Signed)
Message copied by Regis Bill on Tue Oct 12, 2010  5:03 PM ------      Message from: Norma Fredrickson      Created: Tue Oct 12, 2010  2:53 PM       Ok to report. CT with no acute abnormality. Will need appointment with Dr. Sandria Manly after echo and doppler is complete.       Thanks!

## 2010-10-12 NOTE — Telephone Encounter (Signed)
Advised patient of results.  Will call in am to see about an appointment with Dr. Sandria Manly

## 2010-10-12 NOTE — Progress Notes (Signed)
Advised patient of ct results

## 2010-10-13 ENCOUNTER — Ambulatory Visit (HOSPITAL_COMMUNITY): Payer: Medicare Other | Attending: Nurse Practitioner | Admitting: Radiology

## 2010-10-13 DIAGNOSIS — I059 Rheumatic mitral valve disease, unspecified: Secondary | ICD-10-CM | POA: Insufficient documentation

## 2010-10-13 DIAGNOSIS — G459 Transient cerebral ischemic attack, unspecified: Secondary | ICD-10-CM

## 2010-10-14 ENCOUNTER — Other Ambulatory Visit: Payer: Self-pay | Admitting: Neurology

## 2010-10-14 DIAGNOSIS — R42 Dizziness and giddiness: Secondary | ICD-10-CM

## 2010-10-14 DIAGNOSIS — G459 Transient cerebral ischemic attack, unspecified: Secondary | ICD-10-CM

## 2010-10-14 DIAGNOSIS — R0602 Shortness of breath: Secondary | ICD-10-CM

## 2010-10-14 DIAGNOSIS — R209 Unspecified disturbances of skin sensation: Secondary | ICD-10-CM

## 2010-10-15 ENCOUNTER — Telehealth: Payer: Self-pay | Admitting: *Deleted

## 2010-10-15 NOTE — Progress Notes (Signed)
Advised and will fax

## 2010-10-15 NOTE — Progress Notes (Signed)
Left message

## 2010-10-15 NOTE — Telephone Encounter (Signed)
Message copied by Regis Bill on Fri Oct 15, 2010  4:55 PM ------      Message from: Norma Fredrickson      Created: Thu Oct 14, 2010  7:40 AM       Ok to report. Echo is satisfactory. Good pumping function noted. Mitral valve ok. Please send copy to Dr. Sandria Manly.

## 2010-10-15 NOTE — Telephone Encounter (Signed)
Advised of echo  

## 2010-10-19 NOTE — Discharge Summary (Signed)
NAME:  SAVALAS, MONJE               ACCOUNT NO.:  [id number]   MEDICAL RECORD NO.:  [id number]           PATIENT TYPE:   LOCATION:                                 FACILITY:   PHYSICIAN:  [name], M.D. DATE OF BIRTH:  30-Jul-1927   DATE OF ADMISSION:  11/18/2008  DATE OF DISCHARGE:  12/02/2008                               DISCHARGE SUMMARY   ADDENDUM   [name] was originally scheduled for discharge home on November 29, 2008.  However, he developed significant nausea and anorexia.  His discharge  was held pending further workup.  He underwent a chest x-ray, which  showed no pneumothorax with mild atelectasis and no effusion.  His  abdominal exam was benign.  He was treated with Reglan and a bland diet,  and his nausea did improve.  His chest tube was also removed in the  interim and follow up chest x-ray was stable.  He otherwise remained  stable.  At the present time, he is free of nausea and his bowels are  working properly.  CMET showed sodium of 137, potassium 3.8, which has  been repleted, BUN 15, creatinine 0.92 with normal LFTs.  PT is 17.8  with an INR of 1.4.  He has continued to progress with mobility.  He has  remained afebrile and all vital signs have been stable.  He has been  seen and evaluated on morning rounds on December 02, 2008, and at this  point, has been deemed ready for discharge home.  Discharge medications  are unchanged from the previously dictated discharge summary.  Also, he  will be discharged home on Coumadin 2.5 mg daily until his INR is  checked.  Benicar 20 mg daily has been restarted as well.   DISCHARGE FOLLOWUP:  He will see the office nurse in 1 week for wound  recheck and suture removal.  He will also need to follow up with Dr.  Patty Sermons for a PT and INR and a recheck on July 2.  Otherwise, his  discharge instructions are unchanged from the previously dictated  discharge summary.      Coral Ceo, P.A.      [name], M.D.  Electronically Signed    GC/MEDQ  D:  12/02/2008  T:  12/02/2008  Job:  [id number]   cc:   Cassell Clement, M.D.

## 2010-10-19 NOTE — Assessment & Plan Note (Signed)
OFFICE VISIT   Fitzpatrick, Warrick T  DOB:                                                    April [birth year], 2007  CHART #:  [id number]   HISTORY:  The patient returns to the office today for further follow up  status post right miniature thoracotomy for mitral valve repair on November 25, 2008.  He was last seen here in the office on March 09, 2009.  Since then, he had developed worsening problems with bilateral lower  extremity edema, right greater than left.  He was seen in follow up by  Dr. Patty Sermons and a lower extremity duplex scan was performed.  This  revealed no evidence for deep nor superficial venous thrombosis of  either leg.  There was a small right groin lymphocele appreciated that  measured 3.8 x 4.8 cm.  The patient was referred back to our office  because of the lymphocele.  Otherwise, he states that he has been making  slow but steady progress.  Over the last week or so, the amount of  swelling in his legs has gone down substantially by his report.  He  still has significant exertional shortness of breath.  He has been  participating in the cardiac rehab program, and he states that his  exercise tolerance has improved.  He otherwise has no complaints.  The  remainder of his review of systems is unremarkable.   PHYSICAL EXAMINATION:  Notable for well-appearing male with blood  pressure 130/70, pulse 75 and regular, oxygen saturation 95% on room  air.  Examination of the chest reveals clear breath sounds with some  diminished breath sounds at the right lung base.  However, on my exam it  seems as though he may have improved aeration at the right lung base in  comparison with previously.  There is still a significant difference  between right and left, but overall I think it is improved.  Cardiovascular exam demonstrates regular rate and rhythm.  No murmurs,  rubs, or gallops are noted.  The abdomen is soft, nontender.  The  extremities are warm and  well perfused.  There is moderate-to-severe  right lower extremity edema and mild left lower extremity edema.  The  patient reports that the amount of swelling has gone down in both legs.  There is a small right groin lymphocele that is easily palpable.  It is  not large.  It is nontender.  There is no surrounding erythema.  The  remainder of his physical exam is unrevealing.   IMPRESSION:  Small right groin lymphocele with moderate-to-severe right  lower extremity edema.  Because the lymphocele is relatively small and  not causing him any pain or discomfort, I suspect it may be wise to  simply observe it for the time being.  These almost always resolve  overtime, and the degree of lower extremity edema will continue to cause  the lymphocele to persist and/or recur should we drain it  percutaneously.  In the meanwhile, we will need to continue him on  diuretic therapy to treat his generalized volume overload and lower  extremity edema.  With successful medical therapy, his lymphocele should  resolve over time.  If it gets bigger or becomes more symptomatic, we  can always drain it percutaneously.  PLAN:  We will see the patient back in 3 weeks to see how he is doing.  I have suggested that he increase his dose of Lasix to 40 mg daily.   [name], M.D.  Electronically Signed   CHO/MEDQ  D:  04/06/2009  T:  04/07/2009  Job:  [id number]   cc:   Cassell Clement, M.D.  Titus Dubin. Alwyn Ren, MD,FACP,FCCP  Wilhemina Bonito. Marina Goodell, MD  Maretta Bees. Vonita Moss, M.D.  Heloise Purpura, MD  Genene Churn. Love, M.D.

## 2010-10-19 NOTE — Assessment & Plan Note (Signed)
OFFICE VISIT   GENNIE, DIB T  DOB:  [birth year]                                        February 15, 2010  CHART #:  [id number]   HISTORY OF PRESENT ILLNESS:  The patient returns to the office today for  further follow up status post right miniature thoracotomy for mitral  valve repair on November 25, 2008.  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  He was last seen here in the office on  August 03, 2009.  Since then, the patient reports that overall he has  been gradually improving.  He is back playing golf and enjoying more of  his physical activities that he had enjoyed in the past.  However, he is  still fairly significantly limited by exertional shortness of breath and  fatigue.  He has been evaluated by Dr. Marchelle Gearing and repeat fluoroscopy  of the diaphragm was performed in July.  This confirmed the fact that  his right diaphragm remains completely paralyzed with no signs of  improvement.  He has been referred to Dr. Unk Lightning at [facility] for further consultation.  He is scheduled to  see him later this week.  He is also scheduled to see Dr. Patty Sermons for  follow up.  He otherwise seems to be getting along reasonably well.  He  still has problems with weakness in his legs.  This has been attributed  to peripheral neuropathy and inactivity.  He is eating well.  He has  backed out about doing many of his routine activities, but he does limit  that he has not been able to get back to playing tennis.  He does  complain of exertional shortness of breath, but he states that overall  he thinks he is getting along reasonably well.  The remainder of his  review of systems is unremarkable.  Remainder of his past medical  history is notable for the fact that he has undergone cryoablation for  left renal neoplasm in March 2011.  This procedure was tolerated well.  Prior to that, he had been noted to have a  2.5-cm cystic mass in the  lower pole of the left kidney.  Biopsy performed at the time of ablation  was not definitive for any malignant cells.  Since that procedure, the  patient has done well clinically.   CURRENT MEDICATIONS:  Synthroid, aspirin, Protegra, Nexium, gabapentin,  furosemide, Lanoxin, potassium, Spiriva.   PHYSICAL EXAMINATION:  Notable for well-appearing male with blood  pressure 125/79, pulse 78 and regular, oxygen saturation 94% on room  air.  Auscultation of the chest reveals diminished breath sounds on the  right side.  No wheezes, rales, or rhonchi are noted.  Cardiovascular  exam is notable for regular rate and rhythm.  No murmurs, rubs, or  gallops are appreciated.  The abdomen is soft, nontender.  The  extremities are warm and well perfused.   IMPRESSION:  The patient has persistent paralysis of the right  hemidiaphragm now more than 1 year status post right mini thoracotomy  for mitral valve repair.  He has significant exertional shortness of  breath, but overall he seems to be getting along reasonably well.  Options include continued observation versus diaphragmatic plication  versus other investigational approaches to unilateral diaphragmatic  paralysis.  PLAN:  I have attempted to discuss matters with the patient and  described what diaphragmatic plication would involve.  Under his  circumstances, I think this would require an open thoracotomy.  He would  probably see some symptomatic improvement, but because his diaphragm  would remain paralyzed it is difficult to know for certain how much it  would improve his exercise tolerance.  Overall, I suspect it would help,  but it would probably not provide a magic bullet that would suddenly  allow him to go back to playing tennis and another more strenuous  activities.  It would come with some risk, but overall I suspect that he  would tolerate the operation.  The patient plans to go see Dr. Katrine Coho  for  consultation and I have encouraged him to do so.  We will be happy  to see him back further if he desires to think about surgical  intervention.  All of his questions have been addressed.  He will call  to return to see [place] after he has had his consultation with Dr. Katrine Coho  and seen Dr. Patty Sermons for further follow up.   [name], M.D.  Electronically Signed   CHO/MEDQ  D:  02/15/2010  T:  02/16/2010  Job:  [id number]   cc:   Vivien Presto, MD  Cassell Clement, M.D.  Kalman Shan, MD  Titus Dubin. Alwyn Ren, MD,FACP,FCCP  Genene Churn. Love, M.D.

## 2010-10-19 NOTE — Assessment & Plan Note (Signed)
OFFICE VISIT   West, Adrian T  DOB:  1927-11-06                                        December 15, 2008  CHART #:  [id number]   The patient returns to the office today for routine followup, status  post right miniature thoracotomy for mitral valve repair on November 25, 2008.  The patient's postoperative recovery has been relatively  uneventful.  He did have some prolonged nausea and anorexia early after  surgery, but this resolved.  Following hospital discharge, he has been  followed carefully by Dr. Cassell Clement.  His prothrombin time has  been checked and Coumadin dose adjusted accordingly.  In addition, his  blood pressure has been running low ever since surgery, and because of  this his antihypertensive medications have been cut back and eventually  discontinued entirely.  He still has had some exertional shortness of  breath, and he remains on some Lasix.  The patient reports that overall  he is feeling much better.  He is getting around pretty well with only  mild exertional shortness of breath.  The shortness of breath seems to  be somewhat transient and periodic, at times is noticeable and at times  it is not.  His appetite is good.  His bowel function is normal.  He has  had minimal pain in his chest.  He reports no dizzy spells or  tachypalpitations.  The remainder of his review of systems is  unremarkable.   Current medications include Lipitor, Synthroid, aspirin, Protegra,  Nexium, gabapentin, Lasix, Coumadin, and iron sulfate.   PHYSICAL EXAMINATION:  GENERAL:  A well-appearing male.  VITAL SIGNS:  Blood pressure 84/59, pulse is 91 and somewhat irregular,  and oxygen saturation 95% on room air.  LUNGS:  Clear breath sounds.  Breath sounds are slightly diminished at  the right lung base, but he is moving good air on both sides.  No  wheezes or rhonchi are noted.  CARDIOVASCULAR:  Somewhat irregular heart rhythm.  No murmurs, rubs, or  gallops are noted.  ABDOMEN:  Soft and nontender.  The right groin incision has healed  nicely.  CHEST:  The right thoracotomy incision is healing well.  There is mild  swelling in this area.  The chest tube sutures have been removed and the  chest tube suture sites are slowly healing.  EXTREMITIES:  There is no lower extremity edema.   The remainder of his physical exam is unremarkable.   DIAGNOSTIC TEST:  Chest x-ray performed today at the [facility] is reviewed.  This demonstrates stable slight elevation of the  right hemidiaphragm.  There are no pleural effusions of any  significance.  The lung fields are otherwise clear.   IMPRESSION:  Overall, the patient continues to improve.  He still has  some exertional shortness of breath, and his blood pressure has been  running low.  I suspect that left ventricular function has substantially  reduced due to long-term mitral regurgitation, and I would anticipate  that his left ventricular ejection fraction will be reducing compared to  how it looked prior to mitral valve repair.  Overall, he looks pretty  good.  Heart rhythm remains irregular, although electrocardiograms  performed at Dr. Yevonne Pax office have demonstrated only normal sinus  rhythm with frequent premature ventricular contractions.  He is  scheduled  to see Dr. Patty Sermons later today.  The patient is anxious to  go to [place] in [place].  He has also asked about  driving an automobile.   PLAN:  We will ask Dr. Patty Sermons to consider performing an  echocardiogram when he sees the patient later today to rule out  pericardial effusion and double check the appearance of his mitral valve  repair.  If there is no significant pericardial effusion, no other  significant abnormalities or concerns on echo, I think it would be  reasonable for the patient to go ahead and travel [place]  for further convalescence.  I do not think he is ready  to be driving an  automobile, but I am hopeful that perhaps within another week or two, he  might be able to if his blood pressure continues to improve or remains  stable.  I have not made any changes in his current medications.  I have  encouraged him to otherwise continue to gradually increase his physical  activity as tolerated without any particular physical limitations at  this time.  All of his questions have been addressed.  In addition, I  also discussed with the patient the fact that he will need a followup  appointment with Dr. Vonita Moss from [facility] at some point within  the next few months.  There is an abnormality noted on preoperative CT  angiogram and followup MRI of the left kidney, which will need formal  urologic consultation at some point in the future.  There is certainly  no urgency to this, and I have discussed this matter over the telephone  with Dr. Vonita Moss at length.   [name], M.D.  Electronically Signed   CHO/MEDQ  D:  12/15/2008  T:  12/16/2008  Job:  [id number]   cc:   Cassell Clement, M.D.  Maretta Bees. Vonita Moss, M.D.  Wilhemina Bonito. Marina Goodell, MD  Titus Dubin. Alwyn Ren, MD,FACP,FCCP

## 2010-10-19 NOTE — Discharge Summary (Signed)
NAMECOSBY, Adrian West               ACCOUNT NO.:  [id number]   MEDICAL RECORD NO.:  [id number]          PATIENT TYPE:  INP   LOCATION:  1406                         [facility]ACILITY[facility]   PHYSICIAN:  Lyn Records, M.D.   DATE OF BIRTH:  20-Mar-1928   DATE OF ADMISSION:  03/31/2007  DATE OF DISCHARGE:  04/01/2007                               DISCHARGE SUMMARY   REASON FOR ADMISSION:  Right shoulder discomfort.   CONCLUSIONS:  1. Right shoulder discomfort, etiology uncertain.  Suspect      musculoskeletal or neuropathic.  Myocardial infarction has been      excluded.  2. Mitral regurgitation by clinical exam, chronic.  3. Right bundle branch block, possibly new.   PLAN:  The patient will be discharged on an aspirin.  He will continue  his other medications.  We will add ibuprofen 400 mg with meals.  He  will follow up with Dr. Patty Sermons and Dr. Alwyn Ren within the next 24 to  48 hours.  He will return if any chest discomfort, prolonged or  unremitting arm discomfort, or any other worrisome complaint.   CONDITION ON DISCHARGE:  Improved.   COMMENTS:  The patient is 78 and was admitted to the hospital on March 31, 2007, after a 48- to 72-hour history of near continuous right arm  aching and discomfort.  The discomfort is aggravated by movement or  lying on the right side.  He was concerned that this might represent a  cardiac etiology given the fact that his father died early of a  myocardial infarction.  One shot of morphine in the emergency room  relieved the discomfort yesterday for quite a period of time.  After  admission to the floor, recording his blood pressure in the right arm  lead to recurrence of the discomfort.  Nitroglycerin did not relieve the  discomfort.  An EKG was not done as requested.  All his cardiac markers  have been negative.  Up and ambulating this morning led to recurrence of  the shoulder and arm discomfort and numbness.  No shortness of breath,  chest  tightness, or other complaint.   I believe this discomfort is musculoskeletal and neuropathic.  We will  start a nonsteroidal.  He will follow up early this week with his  primary physicians.  He will return if further trouble this weekend.  He  may need an x-ray of his neck and shoulders or an MRI scan of his neck  as I suspect local cause is possibly neuropathic from a nerve  entrapment.      Lyn Records, M.D.  Electronically Signed     HWS/MEDQ  D:  04/01/2007  T:  04/01/2007  Job:  [id number]   cc:   Cassell Clement, M.D.  Fax: [phone]   Titus Dubin. Alwyn Ren, MD,FACP,FCCP  [phone]  [place] [address]   Genene Churn. Love, M.D.  Fax: [phone]

## 2010-10-19 NOTE — Op Note (Signed)
NAME:  Adrian West, Adrian West               ACCOUNT NO.:  [id number]   MEDICAL RECORD NO.:  [id number]          PATIENT TYPE:  INP   LOCATION:  2301                         FACILITY:  [facility]   PHYSICIAN:  [name], M.D. DATE OF BIRTH:  Jul 04, 1927   DATE OF PROCEDURE:  11/25/2008  DATE OF DISCHARGE:                               OPERATIVE REPORT   PREOPERATIVE DIAGNOSIS:  Mitral valve prolapse with severe mitral  regurgitation.   POSTOPERATIVE DIAGNOSIS:  Mitral valve prolapse with severe mitral  regurgitation.   PROCEDURE:  Right miniature thoracotomy for mitral valve repair  (quadrangular resection of posterior leaflet with multiple plication  commissuroplasties of posterior leaflet, artificial Gore-Tex neochord x1  and 32-mm Sorin MEMO 3D ring annuloplasty).   SURGEON:  [name], MD   ASSISTANT:  Doree Fudge, PA   ANESTHESIA:  General.   BRIEF CLINICAL NOTE:  The patient is a 77 year old gentleman from  [place] with long history of mitral valve prolapse and mitral  regurgitation discovered many years ago on routine physical exam.  The  patient has been followed by Dr. Cassell Clement and remained otherwise  healthy all of his life.  The patient has recently developed progressive  symptoms of exertional shortness of breath that have dramatically  worsened over the last 2-3 months.  Followup echocardiogram demonstrates  mitral valve prolapse with severe (4+) mitral regurgitation.  There  remains normal left ventricular systolic function.  Cardiac  catheterization demonstrates normal coronary artery anatomy with no  significant coronary artery disease.  A full consultation note has been  dictated previously.  The patient now presents for elective operative  intervention and provide full informed consent for the surgery as  described.   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  2. Bileaflet prolapse with severe  prolapse of the entire posterior      leaflet.  3. Severe (4+) mitral regurgitation.  4. Moderate left atrial enlargement.  5. Mild left ventricular chamber enlargement.  6. Normal left ventricular systolic function.  7. No residual mitral regurgitation following successful mitral valve      repair.   OPERATIVE NOTE IN DETAIL:  The patient was brought to the operating room  on the above-mentioned date and central monitoring was established by  the Anesthesia Team under the care and direction of Dr. Kipp Brood.  Specifically, a Swan-Ganz catheter was placed through the left internal  jugular approach.  A radial arterial line was placed.  Intravenous  antibiotics were administered.  Following induction with general  endotracheal anesthesia, a Foley catheter was placed by a member of the  operating room staff.  Baseline transesophageal echocardiogram was  performed by Dr. Noreene Larsson.  This confirms the presence of bileaflet  prolapse of the mitral valve with severe (4+) mitral regurgitation.  There was severe prolapse of the posterior leaflet with redundant  thickened leaflet tissue.  The aortic valve was normal.  Left  ventricular systolic function appears normal.  There was mild left  ventricular chamber enlargement with moderate left atrial enlargement.  No other abnormalities were noted.  The patient was positioned with a soft roll behind the right scapula and  the neck gently extended and turned towards the left.  The patient's  right neck, chest, abdomen, both groins, and both lower extremities were  prepared and draped in sterile manner.  A small incision was made in the  right inguinal crease.  At this juncture, it was appreciated that the  patient's Foley catheter was not draining and that in fact no urine was  ever noted in the catheter.  The right groin incision was packed and the  entire set was taken down.  The existing Foley catheter was removed and  a new Foley catheter  was easily replaced by myself into the bladder.  There was obvious return of clear yellow urine.  There was blood at the  meatus consistent with likely inflation of Foley balloon within the  urethra during the initial attempt at Foley placement.  At this  juncture, the Foley catheter drains normally and there was no blood in  the urine.   The patient was reprepped and draped in a manner identical to the  initial approach.  The right groin incision was free exposed.  Through  this incision, the anterior surface of the right common femoral artery  and right common femoral vein were dissected anteriorly.  Limited  dissection was performed just enough to see the anterior surface of the  vessels.  The femoral artery was normal.   A right miniature anterolateral thoracotomy incision was performed just  lateral to the right nipple.  The incision was completed through the  subcutaneous tissues with electrocautery.  The right pleural space was  entered through the fourth intercostal space.  A pledgeted stitch was  placed in the dome of the right hemidiaphragm and retracted inferiorly  to facilitate exposure.  Two 11-mm thoracoscopic ports were placed  through separate stab incisions inferiorly, and the right chest was  insufflated with carbon dioxide gas continuously through the port  throughout the remainder of the operation.  A longitudinal incision was  made in the pericardium 2 cm anterior to the phrenic nerve.  Silk  traction sutures were placed on either side of the pericardium and  retracted to facilitate exposure.   The patient was heparinized systemically.  A pursestring CV4 Gore-Tex  suture was placed on the anterior surface of the right common femoral  vein.  Two concentric pursestring Gore-Tex sutures were placed on the  anterior surface of the common femoral artery.  The femoral vein was  cannulated with a Seldinger technique and a long flexible guidewire was  advanced up through  the femoral vein, through the inferior vena cava,  through the right atrium into the superior vena cava using  transesophageal echocardiogram for guidance.  Femoral vein was dilated  with serial dilators and a 23 x 25-French femoral venous cannula was  then advanced over the guidewire up through the inferior vena cava  through the right atrium until the tip of the cannula extends into the  superior vena cava, again using transesophageal echocardiogram for  guidance.  The femoral artery was cannulated with a Seldinger technique  through the pursestring sutures, and a flexible guidewire was advanced  into the descending thoracic aorta.  The femoral artery was dilated with  serial dilators and an 18-French femoral arterial cannula was advanced  uneventfully into the distal femoral artery.  A small stab incision was  made low in the right neck.  The right internal jugular vein was  cannulated with the Seldinger technique and a flexible guidewire was  advanced into the right atrium.  The internal jugular vein was dilated  with serial dilators and a 14-French pediatric femoral venous cannula  was advanced over the guidewire into the superior vena cava.   Cardiopulmonary bypass was begun.  Vacuum-assisted venous drainage was  utilized.  Drainage was notably excellent and exposure was excellent.  The pericardial incision was extended in both directions.  The  undersurface of the aorta was dissected away from the anterior surface  of the right pulmonary artery.  Two concentric pursestring sutures were  placed in the anterolateral surface of the proximal aorta.  A  pursestring suture was placed in the right atrium.  A retrograde  cardioplegic cannula was placed through the pursestring suture into the  right atrium and into the coronary sinus using transesophageal  echocardiogram for guidance.  An antegrade cardioplegic cannula was  placed directly in the ascending aorta.   The patient was cooled  to 28 degrees systemic temperature.  The aortic  crossclamp was applied and cold blood cardioplegia was delivered  initially in an antegrade fashion through the aortic root.  Supplemental  cardioplegia was administered retrograde through the coronary sinus  catheter.  The initial cardioplegic arrest was rapid with excellent  diastolic arrest.  Repeat doses of cardioplegia were administered  intermittently throughout the crossclamp portion of the operation every  20-30 minutes retrograde through the coronary sinus catheter to maintain  completely flat electrocardiogram.  Myocardial protection was felt to be  excellent.   Left atriotomy incision was performed posteriorly through the  interatrial groove and extended partway across the back wall of the left  atrium after opening the oblique sinus.  A retractor blade was placed in  the atrium and attached to the side arm that was placed through the  small stab incision just to the right side of the sternum through the  third intercostal space.  Exposure of the mitral valve was felt to be  excellent.  The mitral valve was carefully examined.  There was  billowing bileaflet prolapse of the mitral valve with thickened  redundant leaflet tissue, all consistent with Barlow syndrome.  There  was severe prolapse involving all three segments of the posterior  leaflet.  The most traumatic area of prolapse was in the P2, P3 region.  There was actually a large cleft in the middle of P2 such that posterior  leaflet essentially has four components.  There was minimal  calcification and there were no areas of ruptured chords or flail  segments.   Interrupted 2-0 Ethibond horizontal mattress sutures were placed  circumferentially around the entire mitral annulus.  These sutures will  ultimately be utilized for ring annuloplasty, and at this juncture, they  were utilized to suspend the valve symmetrically.  The severely  prolapsed portion of the posterior  leaflet between P2, P3 was now  corrected using a quadrangular resection of this leaflet.  The  intervening vertical defect was closed with interrupted CV5 Gore-Tex  sutures.  A CV3 Gore-Tex pledgeted stitch was placed through the head of  the anterolateral papillary muscle and the sutures were tied and cut.  A  single CV4 Gore-Tex suture was then placed through the pledgeted portion  of the head of the papillary muscle, and the suturing needles were  gathered in organized and dunked into the left ventricular chamber for  later retrieval to be utilized for needle chord replacement.  The  prolapse in  the P1, P2 region was corrected using a plication  commissuroplasty between the cleft between P1 and P2.  Another  commissural plasty was performed between P3 and the posterior lateral  commissure.  At this juncture, the valve appears to be competent with  saline testing.   The valve was sized to accept a 32-mm annuloplasty ring.  A Sorin MEMO  3D annuloplasty ring (catalog number [id number], serial number [id number]) was  secured in place uneventfully.  At this point, the Gore-Tex needle chord  suture was retrieved from the left ventricular chamber and woven into  the posterior leaflet to correct one final bit of prolapse in the P2  region.  The heart was filled with saline prior to tying the suture.  However, the valve appeared to be perfectly competent without the [facility] suture, and the needle chord was then removed.  The valve appears to  be perfectly competent with no residual mitral regurgitation.  There was  a broad symmetrical line of coaptation of the anterior posterior  leaflet.  Rewarming was begun.   The left atriotomy incision was closed using a two-layer closure of  running 3-0 Prolene suture.  Epicardial pacing wire was fixed to the  undersurface of the right ventricular free wall.  One final dose of warm  retrograde hot shot cardioplegia was administered.  The aortic  crossclamp  was removed after total crossclamp time of 173 minutes.   The retrograde cardioplegic cannula was removed.  The heart began to  beat spontaneously without need for cardioversion.  Epicardial pacing  wire was fixed to the right atrial appendage.  The patient was rewarmed  to 37 degrees centigrade temperature.  Pacing was begun and the left  ventricular vent was removed.  The atriotomy closure was completed.  The  lungs were ventilated and the heart allowed to fill and transesophageal  echocardiogram was briefly examined.  The valve repair appears to be  intact.  Full bypass was restarted and antegrade cardioplegic cannula  was removed.  The antegrade cardioplegia site in the aorta was verified  and perfectly intact.   The patient was weaned from cardiopulmonary bypass without difficulty.  The patient's rhythm at separation from bypass is a second-degree block.  The patient was paced with A-V sequential pacing.  The patient was  weaned from bypass on dopamine at 3 mcg/kg/min.  Total cardiopulmonary  bypass time for the operation is 258 minutes.   Followup transesophageal echocardiogram was performed by Dr. Noreene Larsson  after separation from bypass demonstrates a normal functioning mitral  valve.  There was no residual mitral regurgitation whatsoever.  There  was a well-seated annuloplasty ring.  There was no mitral stenosis.  No  other abnormalities were noted.  Left ventricular function appeared  preserved.   Protamine was administered to reverse the anticoagulation.  The femoral  venous cannula was removed.  The femoral arterial cannula was removed  and the pursestring sutures were secured.  There was a palpable pulse in  the distal right femoral artery.  The internal jugular cannula was  removed and the site was controlled with a everting pledgeted stitch and  manual pressure.   Single lung ventilation was begun.  The mediastinum was inspected for  hemostasis.  The atriotomy closure  appears to be perfectly intact.  The  mediastinum was drained with a 28-French Bard drain exited through the  anterior port incision and placed through the oblique sinus.  The  pericardium was closed using a patch of  CorMatrix bovine submucosal  tissue matrix and loosely sewn with 4-0 Prolene suture.  The right chest was irrigated with saline solution.  There was some  bleeding appreciated that appears to be emanating from the anterior port  incision.  The chest tube from this incision was removed and the  incision extended a short distance in both directions.  The intercostal  artery was bleeding.  This was controlled easily with electrocautery.  The mediastinal tube was then replaced through the incision and placed  back into the pericardial space.  The right chest was drained with a 28-  French Bard drain exited through the posterior port incision.  The On-Q  continuous pain management system was utilized to facilitate  postoperative pain control.  One 5-inch catheter was supplied with the  On-Q kit, was tunneled initially through the subcutaneous tissues, and  then tunneled through the intercostal space, and tunneled into the  subpleural space posteriorly to cover the second through the sixth  intercostal nerve roots.  The catheter was flushed with 5 mL of 0.5%  bupivacaine solution and ultimately connected to continuous infusion  pump.   The miniature thoracotomy incision was closed in multiple layers in  routine fashion.  The groin incision was closed in multiple layers in  routine fashion.  All skin incisions were closed with subcuticular skin  closure.  The patient tolerated the procedure well.  Chest tubes were  fixed to closed suction drainage device.  The patient was reintubated  using a single-lumen endotracheal tube and subsequently transported to  the surgical intensive care unit in stable condition.  There were no  intraoperative complications.  All sponge, instrument,  and needle counts  were verified and correct at completion of the operation.      [name], M.D.  Electronically Signed     CHO/MEDQ  D:  11/25/2008  T:  11/25/2008  Job:  [id number]   cc:   Cassell Clement, M.D.  Vesta Mixer, M.D.  Wilhemina Bonito. Marina Goodell, MD  Titus Dubin. Alwyn Ren, MD,FACP,FCCP

## 2010-10-19 NOTE — Discharge Summary (Signed)
NAME:  Adrian West, Adrian West               ACCOUNT NO.:  [id number]   MEDICAL RECORD NO.:  [id number]          PATIENT TYPE:  OUT   LOCATION:  XRAY                         FACILITY:  [facility]   PHYSICIAN:  [name], M.D. DATE OF BIRTH:  12-12-1927   DATE OF ADMISSION:  11/18/2008  DATE OF DISCHARGE:  11/18/2008                               DISCHARGE SUMMARY   FINAL DIAGNOSES:  Mitral valve prolapse with severe mitral  regurgitation.   IN-HOSPITAL DIAGNOSES:  1. Volume overload, postoperatively.  2. Postoperative thrombocytopenia.   SECONDARY DIAGNOSES:  1. Hypertension.  2. Hypothyroidism.  3. Hyperlipidemia.  4. 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  5. History of benign tremor.  6. Status post appendectomy.  7. Status post tonsillectomy.  8. Status post cholecystectomy.  9. Status post right inguinal hernia repair.  10.Status post lumbar laminectomy and diskectomy.  11.Status post sinus surgery.  12.History of esophageal stricture, status post dilatation, November 14, 2008.   IN-HOSPITAL OPERATIONS AND PROCEDURES:  1. Right meningeal thoracotomy for mitral valve repair with      quadrangular resection posterior leaflet and multiple placation      commissuroplasties of posterior leaflet, artificial Gore-Tex      neochord x1, 32 mm Sorin memo 3-D ring annuloplasty.  2. Intraoperative transesophageal echocardiogram.   HISTORY AND PHYSICAL AND HOSPITAL COURSE:  The patient is a 77 year old  gentleman from [place] with long history of mitral valve prolapse and  mitral regurgitation discovered many years ago on routine physical exam.  The patient has been followed by Dr. Cassell Clement and remained  otherwise healthy all his life.  The patient has recently developed  progressive symptoms of exertional shortness of breath that dramatically  worsened over the last 2-3 months.  Follow up echocardiogram  demonstrates mitral valve prolapse with severe 4+ mitral  regurgitation.  There remains normal left ventricular systolic function.  Cardiac  catheterization done demonstrates normal coronary anatomy with no  significant coronary artery disease.  The patient was sent to Dr. Cornelius Moras  for further evaluation.  Dr. Cornelius Moras saw and evaluated the patient.  He  discussed with the patient undergoing mini mitral valve repair.  Risks  and benefits were discussed with the patient.  The patient nods  understanding and agreed to proceed.  Surgery was scheduled for November 25, 2008.  For details of the patient's past medical history and physical  exam please see dictated H and P.   On November 25, 2008, the patient underwent right mini-thoracotomy for  mitral valve repair with quadrangular resection posterior leaflet,  multiple location commissuroplasties of posterior leaflet, artificial  Gore-Tex neochord x1, and 32 mm Sorin memo 3-D ring annuloplasty.  The  patient tolerated this procedure well and was transferred to the  intensive care unit in stable condition.  Postoperatively, the patient  was noted to be hemodynamically stable.  He was able to be extubated  early morning postop day #1.  Post extubation, the patient was noted to  be alert and oriented x4.  Neuro intact.  Postoperatively, the patient  was  in sinus rhythm AAI paced.  Blood pressure was stable.  All drips  were able to be weaned and discontinued.  The patient was started on low-  dose beta-blocker.  He tolerated this well.  His heart rate remained in  sinus rhythm.  He was able to be discontinued from external pacer.  External pacing wires were discontinued postop day #3 without  difficulty.  The patient remained in normal sinus rhythm.  Blood  pressure remained stable.  Postoperatively, chest x-rays were obtained.  These were stable with mild atelectasis.  The patient's right chest tube  remained in place to monitor for drainage.  He still has significant  amount on postop day #3.  We will continue to  follow.  Once drainage  decreases, we will be able to discontinue right chest tubes and follow  up the chest x-ray.  During this time, the patient was using his  incentive spirometer.  He was able to be weaned off oxygen sating  greater than 90% on room air.  Postoperatively, the patient did have  some mild volume overload.  He was started on diuretics.  Daily weights  were obtained.  This was improving.  The patient remains 4.8 kg above  his preoperative weight on postop day #3.  The patient was tentatively  ready for transfer out on postop day #1, but was held secondary to  developing nausea and vomiting.  By postop day #2, the patient's nausea  and vomiting was improving.  He was able have a bowel movement and was  tolerating regular diet by that evening.  The patient had no further  nausea or vomiting.  He was transferred out to stepdown on postop day  #2.  On telemetry floor, the patient's vitals continued to be followed.  He remained afebrile.  The patient was up ambulating with assistance  with cardiac rehab.  All incisions were noted to be clean, dry, and  intact and healing well.  Case management has been consulted to assist  and discharge planning.  He does live at home with his wife.   On postop day #3, November 28, 2008, vital signs were noted to be stable.  His most recent lab work shows sodium of 133, potassium of 3.9, chloride  of 100, bicarbonate 28, BUN of 23, creatinine of 1.07, and glucose of  121.  INR is 1.1.  White blood cell count on September 12, 2008; hemoglobin  was 11.6, hematocrit 33.9, and platelet count is 86.  The patient had  been started on Coumadin postoperatively for mitral valve repair.  We  will continue to follow INR and adjust Coumadin as needed.  The patient  is noted to be progressing well.  The patient is tentatively ready for  discharge to home in the next 48 hours, pending his chest tube drainage  decreases and his mobility increases.   FOLLOWUP  APPOINTMENTS:  A followup appointment has been arranged with  Dr. Cornelius Moras for December 15, 2008, at 10:30 a.m.  The patient will need to  obtain a PMI chest x-ray 30 minutes prior to this appointment.  He will  need to follow up Dr. Patty Sermons in 2 weeks.  He will need to contact his  office to make these arrangements.   ACTIVITY:  The patient is instructed no driving until he released to do  so.  No heavy lifting over 10 pounds.  He is told to ambulate 3-4 times  per day, progress as tolerated and continue his breathing  exercises.   Incisional care.  The patient is told to shower washing his incisions  using soap and water.  He is to contact the office if he develops any  drainage or opening from any of his incision sites.   DIET:  The patient educated on diet to be low-fat and low-salt.   DISCHARGE MEDICATIONS:  1. Lipitor 40 mg daily.  2. Toprol 25 mg daily.  3. Synthroid 75 mcg daily.  4. Aspirin 81 mg daily.  5. Protegra daily.  6. Nexium 40 mg daily.  7. Neurontin 100 mg t.i.d.  8. Coumadin will be dosed based on the patient's discharge PT/INR      level.  Dr. Patty Sermons to manage Coumadin as an outpatient.  9. Lasix 40 mg daily.  10.Potassium chloride 20 mEq daily.  11.Oxycodone 5 mg 1-2 tablets q.4-6 h. p.r.n. pain.      Sol Blazing, PA      [name], M.D.  Electronically Signed    KMD/MEDQ  D:  11/28/2008  T:  11/28/2008  Job:  [id number]   cc:   [name], M.D.  Cassell Clement, M.D.
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
## 2010-10-19 NOTE — Assessment & Plan Note (Signed)
OFFICE VISIT   HAWKEN, BIELBY T  DOB:  1927/06/21                                        April 27, 2009  CHART #:  [id number]   HISTORY:  The patient returns to the office today for further followup  related to his right groin lymphocele.  He was last seen here in the  office on April 06, 2009.  At that time, he had significant volume  overload on physical exam and a small right groin lymphocele was  appreciated.  We increased his dose of Lasix to 40 mg daily.  Since  then, he has done quite well.  The patient now reports that he feels  that he is probably close to 75% in terms of his physical activity.  He  has enjoyed considerable improvement in his exercise tolerance and  breathing over the last few weeks.  He still gets short of breath with  activity, but this has noticeably improved.  He denies any cough.  The  remainder of his review of systems is unremarkable.   PHYSICAL EXAMINATION:  Notable for well-appearing male with blood  pressure 110/70, pulse 83 and regular, oxygen saturation 96% on room  air.  Auscultation of the chest demonstrates some diminished breath  sounds on the right side compared to the left, but overall I think this  has noticeably improved and I am encouraged that he may be starting to  see some improvement in the right side diaphragm function.  Breath  sounds are otherwise clear.  Cardiovascular exam demonstrates regular  rate and rhythm.  No murmurs, rubs, or gallops are noted.  The  extremities are warm and well perfused.  The right groin lymphocele is  definitely smaller on physical exam.  This goes along with the fact that  the amount of lower extremity edema is diminished.  There is still some  swelling in both lower legs at the ankle, and this is greater on the  right side than the left.  However, the amount of lower extremity edema  is considerably diminished over the last 3 weeks.   IMPRESSION:  The patient seems  to be improving nicely.  I think he may  need to remain on oral diuretic therapy indefinitely.  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   PLAN:  We will see the patient back in 3 months with a chest x-ray.  All  of his questions have been addressed.   [name], M.D.  Electronically Signed   CHO/MEDQ  D:  04/27/2009  T:  04/27/2009  Job:  [id number]   cc:   Cassell Clement, M.D.  Titus Dubin. Alwyn Ren, MD,FACP,FCCP  Wilhemina Bonito. Marina Goodell, MD  Maretta Bees. Vonita Moss, M.D.  Heloise Purpura, MD  Genene Churn. Love, M.D.

## 2010-10-19 NOTE — Assessment & Plan Note (Signed)
OFFICE VISIT   Adrian West, INNISS T  DOB:  01/31/1928                                        December 31, 2008  CHART #:  [id number]   HISTORY OF PRESENT ILLNESS:  The patient returns to the office for  further followup, status post right miniature thoracotomy for mitral  valve repair on November 25, 2008.  He was last seen here in the office on  December 15, 2008.  Since then, the patient has made excellent progress.  At  some point along the way, he has converted back into sinus rhythm, and  he was notably in sinus rhythm when he was just seen in the office last  week by Dr. Patty Sermons.  Associated with this, he is feeling much better.  He still has exertional shortness of breath, but is much better than it  was previously.  His appetite is improving.  He still gets tired easily.  He has not had any tachy palpitations.  He has minimal soreness in his  chest.  He did develop erythematous rash and odor involving his right  groin incision, and he was started on topical nystatin therapy by Dr.  Patty Sermons at his last visit for presumed yeast infection.  He otherwise  has no complaints.  His prothrombin time has settled down nicely in the  therapeutic range.  He has not yet had a followup echocardiogram to  check the status of his pericardial effusion.   PHYSICAL EXAMINATION:  General:  Well-appearing male.  Vital Signs:  Blood pressure 128/78, pulse 88 and regular, oxygen saturation 94% on  room air.  Chest: His thoracotomy incision is healing nicely.  Breath  sounds are clear to auscultation.  Breath sounds are still somewhat  diminished on the right side compared to the left, but overall aeration  is fairly good.  No wheezes, rales, or rhonchi are noted.  Cardiovascular:  Demonstrates regular rate and rhythm.  No murmurs,  rubs, or gallops are appreciated.  Abdomen:  Soft, nontender.  Extremities:  Warm and well perfused.  There is no lower extremity  edema.  The right groin  incision remained intact, but there is a  erythematous rash extending medially and down onto the scrotum,  consistent with probable yeast infection.  The remainder of his physical  exam is unremarkable.   DIAGNOSTICS TEST:  Chest x-ray performed today at the [facility] is reviewed.  This demonstrates stable radiographic appearance  overall.  The cardiac silhouette is stable without any further  enlargement.  There is still some elevation of the right hemidiaphragm  with associated right lower lobe atelectasis.  However, the lungs are  clear and there are no significant pleural effusions.   IMPRESSION:  The patient is improved substantially over the last couple  of weeks probably for the most part related to the fact that he is  converted back into sinus rhythm.  He still has exertional shortness of  breath, but this is improving.  He still has some paralysis of the right  hemidiaphragm, and it would be expected that this may take months to  gradually resolve.  However, breath sounds on the right side are  improving.  He does have what appears to be a yeast infection involving  his right groin.  This will need to be monitored closely.  Overall,  he  looks good.   PLAN:  I have encouraged the patient to start increasing his physical  activity gradually as tolerated.  We have not made any changes in his  current medications.  All of his questions have been addressed.  At some  point, we would recommend a followup echocardiogram to make sure that  his pericardial effusion is resolving.  As long as, he is doing well, it  is reasonable to hold off for a couple of weeks.  We will plan to see  him back in 4 weeks with a followup chest x-ray.  He will call or return  sooner if he develops increased shortness of breath or worsening redness  or drainage from his right groin incision.   [name], M.D.  Electronically Signed   CHO/MEDQ  D:  12/31/2008  T:  01/01/2009   Job:  [id number]   cc:   Cassell Clement, M.D.  Titus Dubin. Alwyn Ren, MD,FACP,FCCP  Wilhemina Bonito. Marina Goodell, MD  Maretta Bees. Vonita Moss, M.D.

## 2010-10-19 NOTE — H&P (Signed)
NAMESALEM, Adrian West               ACCOUNT NO.:  [id number]   MEDICAL RECORD NO.:  [id number]          PATIENT TYPE:  INP   LOCATION:  1406                         FACILITY:  [facility]   PHYSICIAN:  Lyn Records, M.D.   DATE OF BIRTH:  1928-02-15   DATE OF ADMISSION:  03/31/2007  DATE OF DISCHARGE:                              HISTORY & PHYSICAL   REASON FOR ADMISSION:  Shoulder and right arm and elbow discomfort on  exertion.   SUBJECTIVE:  The patient is 77 years of age and had flown to [place] 2  days ago to celebrate his 50th wedding anniversary.  He noticed right  shoulder discomfort that he characterizes severe and sharp that tended  to radiate down the inner aspect of his right arm into his right elbow.  The discomfort was aggravated by physical activity.  The discomfort was  nearly continuous.  There was also a positional component with the  patient complaining that he cannot sleep on his right side because of  aggravated discomfort.  It was not relieved by nonsteroidals.  Here in  the emergency room today, he is totally pain-free and states that it  will come back again if he gets up and starts turning around too much.  Manipulation during the exam and different activities upon instruction  using the right arm did not precipitate discomfort.  The patient has no  prior history of coronary disease.  He does have a history of heart  murmur for which Dr. Patty Sermons has followed for greater than 20 years  according to the patient.   MEDICATIONS:  1. Toprol-XL 25 mg a day.  2. Synthroid 75 mcg per day.  3. Hyzaar 50 mg per day.  4. Furosemide 20 mg per day.  5. Vytorin 10/40 mg per day.  6. Aspirin 81 mg per day.  7. Valtrex p.r.n.  8. Protegra p.r.n.   FAMILY HISTORY:  The patient's father died at age 50 of myocardial  infarction.   Significant prior medical history including surgeries:  1. Cholecystectomy.  2. Appendectomy.  3. Umbilical herniorrhaphy.  4.  Hypercholesterolemia.  5. Hypothyroidism.  6. Hypertension.  7. History of heart murmur.  8. Benign positional vertigo.  9. Benign essential tremor.   HABITS:  He does not smoke.  He has occasional social alcohol.  The  patient is married, lives with his wife.  He is physically active,  retired.   REVIEW OF SYSTEMS:  Appetite has been good.  No bowel or bladder  incontinence.  He has not had transient neurological symptoms.  Denies  orthopnea, PND, tachypalpitations, and syncope.   OBJECTIVE:  On exam, the patient was lying comfortably on the gurney in  the emergency room in no distress.  His blood pressure was 138/70.  The rate was 70.  His respirations were  16.  He was in no distress.  HEENT:  Unremarkable.  No xanthelasma was noted.  No jaundice was seen.  NECK:  Revealed no thyromegaly or carotid bruits.  Carotid upstroke was  normal.  LUNGS:  Clear to auscultation and percussion with the  exception of faint  crackles at the right base that did not clear with deep breathing or  cough.  CARDIAC:  Revealed a late peaking holosystolic murmur heard loudest at  the apex that radiated into the axilla.  No gallop was heard.  The  systolic murmur could not be heard at the upper sternal border in the  aortic position.  No diastolic murmur was heard.  ABDOMEN:  Soft.  Bowel sounds were normal.  EXTREMITIES:  Did not reveal edema.  Pedal pulses and radial pulses were  2+.  NEURO:  Did reveal a mild tremor of the left arm.  No focal deficits  were noted.   I have not seen the patient's chest x-ray.  His EKG demonstrates right  bundle-branch block, sinus bradycardia 59 beats per minute, left atrial  abnormality, PVCs.   LABORATORY DATA:  BUN and creatinine of 15 and 1.08, potassium of 4.3.  Two sets of cardiac markers are normal.  Hemoglobin is 14.8, liver  enzymes are normal.  Total bilirubin is 1.4.   ASSESSMENT:  1. This is an atypical presentation with an intermediate  probability      of coronary artery disease manifested by aching right shoulder and      arm and elbow discomfort, worse with exertion and relieved by rest.      There are some nonexertional components to this discomfort.  Given      the patient's age and risk factors, he will be admitted to rule out      myocardial infarction.  2. Mitral regurgitation, probably related to mitral valve prolapse.  3. Abnormal EKG with right bundle and bradycardia.   PLAN:  1. Subcu Lovenox.  2. Continue his usual medications.  3. If recurrent arm discomfort, sublingual nitroglycerin will be used.  4. Serial markers and EKGs will determine further evaluation and      whether the evaluation will be done in hospital or as an outpatient      by Dr. Patty Sermons.      Lyn Records, M.D.  Electronically Signed     HWS/MEDQ  D:  03/31/2007  T:  04/01/2007  Job:  [id number]   cc:   Evie Lacks, MD  Fax: [phone]   Titus Dubin. Alwyn Ren, MD,FACP,FCCP  [phone] [place]  [place] [address]   Cassell Clement, M.D.  Fax: [phone]

## 2010-10-19 NOTE — Assessment & Plan Note (Signed)
OFFICE VISIT   Adrian West, Adrian West  DOB:  07/15/1927                                        January 26, 2009  CHART #:  [id number]   HISTORY OF PRESENT ILLNESS:  The patient returns to the office today for  further followup, status post right miniature thoracotomy for mitral  valve repair on November 25, 2008.  He was last seen here in the office on  December 31, 2008.  The patient reports that since then overall he has done  reasonably well, although he still has problems with exertional  shortness of breath and some difficulty sleeping at night.  He has had  very little if any pain whatsoever.  He states that when he walks he  gets short of breath reasonably quickly.  He does report that this has  gradually improved and has not gotten any worse over the last several  weeks.  When he sleeps at night, he does well when he lays on his right  side, but if he turns to his left he gets more short of breath.  He has  not had any dizzy spells.  He has not had any tachy palpitations.  His  appetite is good.  He does feel that he has gotten some stronger.  He  has no other complaints, although he has had a few episodes of epistaxis  since he's been on coumadin.  He was seen in the office 3 days ago by  Dr. Patty Sermons and apparently a followup echocardiogram was performed.  He was told that the known pericardial effusion remains stable.   MEDICATIONS:  Remain unchanged.   REVIEW OF SYSTEMS:  The remainder of his review of systems is  unremarkable.  He is scheduled to start cardiac rehab next week.   PHYSICAL EXAMINATION:  Notable for a well-appearing male with blood  pressure 144/87, pulse 90 and regular, oxygen saturation 94% on room  air.  HEENT exam is notable for the absence of jugular venous  distention.  Auscultation of the chest reveals slightly diminished  breath sounds on the right side compared to the left.  However, he is  moving good air on both sides.  No wheezes,  rales, or rhonchi are noted.  Cardiovascular exam is notable for regular rate and rhythm.  No murmurs,  rubs, or gallops are appreciated.  The abdomen is soft, nontender.  The  extremities are warm and well perfused.  There is mild bilateral lower  extremity edema.  The remainder of his physical exam is unremarkable.   DIAGNOSTIC TESTS:  Chest x-ray performed on January 23, 2009, is  reviewed.  This demonstrates stable mild elevation of the right  hemidiaphragm that is unchanged from previously.  There is no  significant pleural fluid on either side.  There is no pulmonary edema.  No other abnormalities are noted.   IMPRESSION:  Overall, the patient appears stable and perhaps slightly  improved.  I am disappointed that he still has as much exertional  shortness of breath as he does, and I suspect that the combination of  the moderate-sized pericardial effusion and the probable mild  hemiparesis of the right hemidiaphragm are both contributing to his  symptomatology.  If he does not improve in the near future, we could  consider draining his pericardial effusion surgically, and I do suspect  that this would make him feel better.  He is moving air on the right  side, and with time his diaphragmatic function should continue to  gradually normalize.  He does have mild fluid overload on exam.   PLAN:  I have instructed the patient to increase his dose of Lasix to 40  mg twice daily and to go with that to increase his dose of potassium to  20 mEq twice daily.  We have given him prescription for this today.  We  will see him back in the office in 6 weeks or sooner should further  problems or difficulties arise.  We will discuss directly his progress  with Dr. Patty Sermons and make sure that his followup echocardiogram  remains stable.  The patient has had a couple of brief episodes of  epistaxis while he has remained on Coumadin, and I have reminded him  that if he continues to have bleeding  problems he should probably just  stop taking Coumadin temporarily.  The risk of stroke should be pretty  low at this time, particularly if he is maintaining sinus rhythm.  All  of his questions have been addressed.   Adrian West, M.D.  Electronically Signed   CHO/MEDQ  D:  01/26/2009  West:  01/27/2009  Job:  [id number]   cc:   Cassell Clement, M.D.  Titus Dubin. Alwyn Ren, MD,FACP,FCCP  Wilhemina Bonito. Marina Goodell, MD  Maretta Bees. Vonita Moss, M.D.

## 2010-10-19 NOTE — Assessment & Plan Note (Signed)
OFFICE VISIT   Adrian West, Adrian West  DOB:  Oct 06, 1927                                        February 10, 2009  CHART #:  [id number]   HISTORY OF PRESENT ILLNESS:  The patient returns to the office today for  further followup status post right miniature thoracotomy for mitral  valve repair on November 25, 2008.  His postoperative recovery has been  slow.  He was last seen here in the office on January 26, 2009, at which  time he was noted to have persistent elevation of the right  hemidiaphragm consistent with paresis of the right phrenic nerve.  Since  then, he was hospitalized at [facility] [place]  with an episode of severe epistaxis.  His Coumadin was reversed and  discontinued and epistaxis resolved.  He apparently was not transfused,  although he was told that his hemoglobin went down to 8.5.  At that time  he was notably short of breath, and he underwent a variety of other  tests including a chest CT scan to rule out pulmonary embolus, a 2-D  echocardiogram, and lower extremity duplex scan to rule out deep venous  thrombosis.  The CT scan confirmed the presence of elevation of the  right hemidiaphragm.  There was also comment raised regarding a small  hypodense mass in the right chest alongside the SVC-right atrial  junction and mild associated hilar and mediastinal lymphadenopathy.  There was only a small pericardial effusion and no significant pleural  fluid identified.  There is no evidence for pulmonary embolus.  A 2-D  echocardiogram confirmed the presence of a small pericardial effusion  with no sign of pericardial tamponade.  Lower extremity duplex scan  revealed no sign of any deep venous thrombosis.  There was a cystic mass  in the right groin consistent with lymphocele from the patient's recent  surgery.  The patient returned to [facility] for followup today.  He was  seen by Dr. Patty Sermons earlier today who instructed him to  discontinue  taking Lipitor and amiodarone.  The patient reports that he feels weak.  He has complained of some achiness in his muscles particularly his  thighs.  He still has pronounced exertional shortness of breath.  He  denies any resting shortness of breath or orthopnea.  He has persistent  bilateral lower extremity edema, more so on the right than the left.  This has not gotten better.  He reports that his appetite is fairly good  and his bowel function is regular.  He thinks that he has some reflux  and he has a dry intermittent cough.  He has been napping a lot during  the day and intermittently having trouble sleeping at night, although he  notes that he has been sleeping better for the last several days.  He  has been taking part in the cardiac rehab program up in [place], although  his stamina has been limited.  He has also been ambulating some with his  wife who is trying to encourage him to do more.  The remainder of his  review of systems is unrevealing.   CURRENT MEDICATIONS:  Aspirin 81 mg daily, Nexium 40 mg daily, Synthroid  75 mcg daily, Lasix 80 mg daily, digoxin 0.125 mg daily, iron sulfate  325 mg daily, gabapentin 100 mg  three times daily, Levaquin 250 mg twice  daily for three more days.   PHYSICAL EXAMINATION:  GENERAL:  Notable for well-appearing male who  appears his stated age, in no acute distress.  VITAL SIGNS:  Blood  pressure 105/69, pulse is 86 and regular, oxygen saturation 93% on room  air.  HEENT:  Notable for the absence of jugular venous distention.  CHEST:  Auscultation of the chest reveals clear breath sounds with  diminished breath sounds on the right side.  No wheezes, rales, or  rhonchi are noted at all.  Breath sounds are particularly diminished  near the right lung base, but there is no egophony appreciated.  CARDIOVASCULAR:  Demonstrates distant heart sounds with regular rate and  rhythm.  No murmurs, rubs, or gallops are noted.  ABDOMEN:   Soft,  nontender.  There is no ascites.  EXTREMITIES:  Warm and well perfused.  There is moderate bilateral lower extremity edema, right greater than  left.  There is mild fluctuant mass in the right groin consistent with a  small lymphocele.  This is nontender.  There is no surrounding erythema.  The remainder of his physical exam is unrevealing.   DIAGNOSTIC TESTS:  Chest CT scan and upright chest x-ray performed at  [facility] in [place], [place], last week are all  reviewed.  CT scan demonstrates the presence of elevation of the right  hemidiaphragm.  There is trivial right pleural effusion.  There is a  small pericardial effusion.  A small hypodense mass identified on this  CT scan located near the SVC-right atrial junction, a density suggestive  of possibly loculated fluid or early scar tissue or hematoma related to  the surgery performed in June.  This was not present on chest CT scan  performed preoperatively and unquestionably would be related to surgery  and not some undiscovered mass lesion to be concerned about.  Similarly,  there is reactive lymphadenopathy in the right hilum and subcarinal  space, but there is nothing pathologic to be concerned about.   IMPRESSION:  The patient's recovery has been slow.  He has persistent  elevation of the right hemidiaphragm consistent with phrenic nerve  paresis that has clearly impacted his breathing and exercise capacity.  This may be further exacerbated by an expected amount of volume overload  and congestive heart failure related to his longstanding mitral valve  disease and recent mitral valve repair.  As such, further exercise and  ambulation and deep breathing will be helpful but his recovery will be  expectedly slow, taking probably at least 6 months or more.  However,  this does not mean that he should limit his physical activity but rather  he should push it as much as practical under the circumstances.  He  does  have a small lymphocele in the right groin.  The mass lesion raised by  the interpreting radiologist  for the CT scan performed recently in  [place] was not present on a CT scan performed prior to his surgery, and  undoubtedly represents loculated fluid and/or hematoma related to the  surgery which will gradually resolve.  The patient does have a 2.5 cm  lesion in the left kidney for which he will need to see Dr. Vonita Moss for  followup at some point in the future.  I would recommend a followup MRI  of the left kidney in 3-6 months.  Dr. Vonita Moss is aware of this and  will be expecting his office visit at  some point in the future once the  patient has had further time to recover from his surgery.  Finally, the  patient also has long history of reflux disease with recent dilatation  of lower esophageal stricture by Dr. Marina Goodell.  The presence of persistent  reflux could further exacerbate his chronic cough and shortness of  breath.  However, I doubt there is anything further to do about this at  this time.   PLAN:  I have instructed the patient to continue to make an effort with  gradual increase in his physical activity as tolerated, and I have  encouraged him to try to continue in the cardiac rehab program.  He has  no physical limitations with respect to his surgery, only that with  respect to what his exercise tolerance will allow.  I agree with keeping  him off Coumadin, and I further agree with stopping amiodarone at this  time.  He may need more Lasix, but I have not suggested that he should  change his dose of Lasix at this time.  I have instructed him to keep  track of his weight over a period of time and to make an effort to keep  his legs elevated as much as possible when he is not up and ambulating.  All of his questions have been addressed.  We will plan to see him back  for further followup in 4 weeks.  We will obtain blood work today to  follow up his hemoglobin, renal  function, electrolytes, BNP level, and  protein levels.   [name], M.D.  Electronically Signed   CHO/MEDQ  D:  02/10/2009  West:  02/11/2009  Job:  [id number]   cc:   Cassell Clement, M.D.  Titus Dubin. Alwyn Ren, MD,FACP,FCCP  Wilhemina Bonito. Marina Goodell, MD  Maretta Bees. Vonita Moss, M.D.

## 2010-10-19 NOTE — Assessment & Plan Note (Signed)
OFFICE VISIT   Adrian West, SCANTLEBURY T  DOB:  November 10, 1927                                        August 03, 2009  CHART #:  [id number]   HISTORY OF PRESENT ILLNESS:  The patient returns to the office today for  further followup status post right miniature thoracotomy for mitral  valve repair on November 25, 2008.  This procedure was complicated by right  phrenic nerve paralysis.  He was last seen here in the office on  April 27, 2009.  Since then, the patient has remained essentially  clinically stable.  He still complains of exertional shortness of  breath, and he was formally evaluated by Dr. Marchelle Gearing of the [facility]  Pulmonary team on July 17, 2009.  Repeat fluoroscopy of the  diaphragm performed July 14, 2009 confirmed that the right  hemidiaphragm was not moving, consistent with unilateral phrenic nerve  paralysis.  Chest CT scan performed July 07, 2009 revealed right lung  basilar atelectasis with elevation of the hemidiaphragm and no other  problems at all.  The patient has also been followed carefully by Dr.  Patty Sermons, and followup echocardiograms have revealed an excellent  result with no residual mitral regurgitation.  More recently, the  patient has been evaluated for his left renal mass, and apparently he  has now been scheduled to undergo cryotherapy ablation for this left  renal mass.  The patient has not had biopsy performed on this mass to  date.  Finally, the patient has also been referred for a formal  hematologic consult due to concerns regarding possible hematologic  malignancy or premalignant state.  Overall, the patient is doing fairly  well with exception of the fact that he continues to complain of  exertional fatigue and shortness of breath.  He also states that he has  trouble standing up straight and he blames this on his surgery from last  summer.  He denies any orthopnea, PND, or lower extremity edema.  He  gone back to  playing golf, but he states that he can barely make it  through 9 holes of golf without having to stop.  His appetite is normal.  His bowel function is regular.  The remainder of his review of systems  is unrevealing.   PAST MEDICAL HISTORY:  Unchanged from previously.   CURRENT MEDICATIONS:  Synthroid, aspirin, Protegra, Nexium, gabapentin,  Lasix, potassium, iron sulfate, and digoxin.   PHYSICAL EXAMINATION:  Notable for well-appearing elderly male with  blood pressure 132/76, pulse 80 and regular, and oxygen saturations 95%  on room air.  HEENT exam is unrevealing.  There is no jugular venous  distention.  Auscultation of the chest reveals clear breath sounds  bilaterally.  There is somewhat diminished breath sounds at the right  lung base, but nonetheless there are reasonably good breath sounds on  the right side.  Cardiovascular exam demonstrates regular rate and  rhythm.  No murmurs, rubs, or gallops are noted.  The abdomen is soft,  nondistended, and nontender.  Bowel sounds are present.  The extremities  are warm and well perfused.  There is no lower extremity edema.   DIAGNOSTIC TESTS:  Chest x-ray performed today demonstrates stable  elevated right hemidiaphragm.  Lung fields are otherwise clear.  No  other abnormalities are noted.   IMPRESSION:  The patient has  unilateral right phrenic nerve paralysis,  which likely represents traumatic injury that occurred during surgery at  the time of his mitral valve repair last June.  Such injuries usually  recover over time.  However, it has been over 6 months since surgery,  and at this point there still is no sign of substantial improvement.   PLAN:  I have discussed the matters again with the patient at length.  He seems to have trouble remembering what we have discussed previously.  I am still hopeful that there will be a chance that his phrenic nerve  function may gradually recover, and certainly regeneration of peripheral   nerves following injury are slow in patients this age.  There obviously  remains a possibility that the phrenic nerve function may never recover.  I would be reluctant to consider any sort of aggressive surgical  interventions such as diaphragmatic plication unless there remains no  sign of improvement for at least 2 years.  We will plan to see the  patient back in 6 months' time with repeat fluoroscopy of his diaphragm.   Adrian West. Cornelius Moras, M.D.  Electronically Signed   CHO/MEDQ  D:  08/03/2009  T:  08/04/2009  Job:  [id number]   cc:   Cassell Clement, M.D.  Kalman Shan, MD  Titus Dubin. Alwyn Ren, MD,FACP,FCCP  Wilhemina Bonito. Marina Goodell, MD  Maretta Bees. Vonita Moss, M.D.  Heloise Purpura, MD  Dr. Avie Echevaria

## 2010-10-19 NOTE — Procedures (Signed)
DUPLEX DEEP VENOUS EXAM - LOWER EXTREMITY   INDICATION:  Right lower extremity swelling.   HISTORY:  Edema:  Right lower extremity since surgery.  Trauma/Surgery:  Heart surgery 11/25/2008.  Pain:  No.  PE:  No.  Previous DVT:  No.  Anticoagulants:  Other:   DUPLEX EXAM:                CFV   SFV   PopV  PTV    GSV                R  L  R  L  R  L  R   L  R  L  Thrombosis    o  o  o     o     o      o  Spontaneous   +  +  +     +     +      +  Phasic        +  +  +     +     +      +  Augmentation  +  +  +     +     +      +  Compressible  +  +  +     +     +      +  Competent     +  +  +     +     +      +   Legend:  + - yes  o - no  p - partial  D - decreased   IMPRESSION:  1. No evidence of deep or superficial venous thrombosis in the right      lower extremity or left common femoral vein.  2. Evidence of nonvascularized mass suggestive of hematoma or      lymphocele measuring 3.8 cm x 4.88 cm in right groin.  3. Preliminary results were called to [facility] at Dr. Yevonne Pax      office and faxed.         _____________________________  Larina Earthly, M.D.   AS/MEDQ  D:  03/27/2009  T:  03/28/2009  Job:  [id number]   cc:   [name], M.D.  Cassell Clement, M.D.

## 2010-10-19 NOTE — Cardiovascular Report (Signed)
NAME:  Adrian West, Adrian West NO.:  [id number]   MEDICAL RECORD NO.:  [id number]          PATIENT TYPE:  OIB   LOCATION:  1962                         FACILITY:  [facility]   PHYSICIAN:  Vesta Mixer, M.D. DATE OF BIRTH:  Mar 20, 1928   DATE OF PROCEDURE:  10/31/2008  DATE OF DISCHARGE:                            CARDIAC CATHETERIZATION   INDICATIONS:  Adrian West is a 77 year old gentleman with a long history of  mitral valve prolapse.  He was recently noted to have worsening mitral  regurgitation as well as some left ventricular enlargement.  He was  referred for heart catheterization for further evaluation.   PROCEDURE:  Right and left heart catheterization.   TECHNIQUE:  The right femoral artery and right femoral vein were easily  cannulated using the modified Seldinger technique.   HEMODYNAMICS:  RA pressures estimated at 10.  Artery pressure was 48/12.  Pulmonary capillary wedge pressures 19.  PA pressures 36/14 with a mean  of 22.   O2 saturation:  RFA saturation was 93%.  The PA saturation was 71%.   The cardiac output by thermodilution is 4.7 L/min.  The index is 2.2  L/min.  The cardiac output by Fick calculation is 6.6 L/min with a  cardiac index of 3.1 L/min.   LV pressure was 112/10.  The aortic pressure was 114/58.  There was no  aortic valve gradient.   ANGIOGRAPHY:  Left main:  The left main is smooth and normal.   The left anterior descending artery is smooth and normal.  It gives off  a large first diagonal artery which is normal.   The ramus intermediate vessel is normal.  The left circumflex artery is  a large vessel.  It is normal throughout its course.  It gives off a  large obtuse marginal artery which is normal.   The right coronary artery is normal.  The posterior descending artery  and the posterolateral segment artery are normal.   The left ventriculogram was performed in the 30-degree RAO position.  It  revealed well-preserved left  ventricular systolic function.  Ejection  fraction is estimated at 60%.  There is significant severe mitral  regurgitation.  The left atrium is markedly enlarged.   COMPLICATIONS:  None.   CONCLUSIONS:  1. Smooth and normal coronary arteries.  2. Still well-preserved left ventricular systolic function but with      mildly enlarged left ventricle.  3. There is significant mitral regurgitation with left atrial      enlargement.  We will refer the patient to [facility]TTS for mitral valve      repair or replacement.  He does not need coronary bypass.      Vesta Mixer, M.D.  Electronically Signed     PJN/MEDQ  D:  10/31/2008  T:  11/01/2008  Job:  [id number]   cc:   [name], M.D.  Cassell Clement, M.D.

## 2010-10-19 NOTE — Assessment & Plan Note (Signed)
OFFICE VISIT   Adrian West, Adrian West  DOB:  1928-02-16                                        March 09, 2009  CHART #:  [id number]   HISTORY:  The patient returns for further followup status post right  miniature thoracotomy for mitral valve repair on November 25, 2008.  He was  last seen in the office on February 10, 2009.  Since then, the patient  reports that he has made substantial progress.  He still has exertional  shortness of breath, but he is doing much better than he was at the time  of his last office visit.  He has been participating in the cardiac  rehab program and enjoying some gradual improvement in his exercise  tolerance.  He has not had any fevers, chills, or productive cough.  He  has not had any chest pain.  He has not had any dizzy spells.  He has  not had any further problems with epistaxis since he stop taking  Coumadin.  His exercise tolerance is still not back to his baseline as  he still is troubled with some exertional shortness of breath.  However,  he has clearly improved.  The remainder of his review of systems is  unremarkable.  The remainder of his past medical history is unchanged.   CURRENT MEDICATIONS:  Synthroid, aspirin, Protegra, Nexium, gabapentin,  furosemide, iron sulfate, digoxin, potassium chloride.   PHYSICAL EXAMINATION:  Notable for well-appearing male with blood  pressure 120/78, pulse 84 and regular, oxygen saturation 98% on room  air.  Examination of the chest reveals a well-healed miniature  anterolateral thoracotomy incision.  Auscultation demonstrates clear  breath sounds bilaterally with some diminished breath sounds persisting  at the right lung base.  No wheezes or rhonchi are noted.  Cardiovascular exam is notable for regular rate and rhythm.  No murmurs,  rubs, or gallops are noted.  The abdomen is soft, nontender.  The right  groin incision has healed nicely.  There is trace-to-mild bilateral  lower  extremity edema.  No other abnormalities are noted.   IMPRESSION:  The patient appears to be making slow but steady progress.  He still has some diminished breath sounds at the right lung base  consistent with his known right phrenic nerve paresis.  However, he is  clinically improving.   PLAN:  We will have the patient returns for further followup with a  repeat chest x-ray in 3 months.  I have told him that it could be at  least another 3-6 months before he sees substantial improvement in his  right phrenic nerve function.  Hopefully, it will continue to recover  over that period of time.  I do not feel that he should go back on  Coumadin.  We have not made any changes to his current medications.  All  of his questions have been addressed.   [name], M.D.  Electronically Signed   CHO/MEDQ  D:  03/09/2009  West:  03/10/2009  Job:  [id number]   cc:   Cassell Clement, M.D.  Titus Dubin. Alwyn Ren, MD,FACP,FCCP  Wilhemina Bonito. Marina Goodell, MD  Maretta Bees. Vonita Moss, M.D.

## 2010-10-19 NOTE — Assessment & Plan Note (Signed)
OFFICE VISIT   Adrian West, Adrian West  DOB:  09-Oct-1927                                        November 17, 2008  CHART #:  [id number]   The patient returns for further followup with tentative plan to proceed  with elective mitral valve repair on Tuesday, November 25, 2008.  He was  originally seen in consultation on November 06, 2008, and a full history and  physical exam was dictated at that time.  Since then, the patient  underwent upper GI endoscopy and dilatation for a mild esophageal  stricture by Dr. Yancey Flemings at the [facility] office.  Details of this procedure are not yet available, but according to the  patient, the procedure was very quick and virtually painless.  He had  done fine and he has no difficulty swallowing.  CT angiogram of the  thoracic abdominal aorta and iliac vessels was performed on November 07, 2008.  This revealed no significant signs of peripheral vascular  disease.  There was a small lesion on the lower pole of the left kidney  that probably represents a benign renal cyst, but had some complex  features and as such will require followup at some point down the road.  Otherwise, no significant findings were identified.   The patient is very eager to proceed with surgery.  We will plan right  miniature thoracotomy for mitral valve repair on Tuesday, November 25, 2008.  I feel confident that his valve should be repairable, but he understands  that there is always a small chance that his valve would need to be  replaced.  If this were the case, we would use a bioprosthetic tissue  valve.  He understands and accepts all potential associated risks of  surgery including but not limited to risk of death, stroke, myocardial  infarction, congestive heart failure, respiratory failure, renal  failure, pneumonia, bleeding requiring blood transfusion, arrhythmia,  heart block with bradycardia requiring permanent pacemaker, possible  late complications  related to valve repair, pleural effusion, phrenic  nerve palsy, injury to the artery or vein going to the lower extremity.  I have given the  patient a prescription for amiodarone to begin now prior to surgery to  decrease his risk of perioperative dysrhythmias.  All of his questions  have been addressed.   [name], M.D.  Electronically Signed   CHO/MEDQ  D:  11/17/2008  West:  11/18/2008  Job:  [id number]   cc:   Cassell Clement, M.D.  Vesta Mixer, M.D.  Wilhemina Bonito. Marina Goodell, MD  Titus Dubin. Alwyn Ren, MD,FACP,FCCP

## 2010-10-19 NOTE — H&P (Signed)
NAME:  TIGHE, GITTO NO.:  [id number]   MEDICAL RECORD NO.:  [id number]           PATIENT TYPE:   LOCATION:                                 FACILITY:   PHYSICIAN:  Vesta Mixer, M.D.      DATE OF BIRTH:   DATE OF ADMISSION:  DATE OF DISCHARGE:                              HISTORY & PHYSICAL   Adrian West is an elderly gentleman with a history of mitral valve prolapse  and mitral regurgitation.  He is now admitted for heart catheterization  after a recent echocardiogram revealed worsening of his mitral  regurgitation.   Adrian West has history of mitral valve prolapse for many years.  He has  been followed by Dr. Patty Sermons.  He has been relatively stable.  He has  had echocardiograms periodically.  His most recent echo was performed  several weeks ago which reveals acute worsening of his mitral  regurgitation.  He also has some enlargement of his left ventricle as  well as left atrial enlargement.  He has mild pulmonary hypertension  with an estimated PA pressure of 45.   The patient has had some shortness breath.  The shortness of breath  occurred while he was up at his cabin in [place].  He was unsure  whether it was just due to the altitude, but of note is that he goes up  there all the time and previously has not had any problems when he goes  up to his cabin.   He denies any episodes of chest pain.  He denies any syncope or  presyncope.  He denies any PND or orthopnea.   CURRENT MEDICATIONS:  1. Synthroid 75 mcg a day.  2. Aspirin 81 mg a day.  3. Toprol-XL 25 mg a day.  4. Benicar HCT 20 mg/12.5 mg once a day.  5. Lipitor 40 mg a day.   ALLERGIES:  None.   PAST MEDICAL HISTORY:  1. Mitral valve prolapse with mitral regurgitation.  2. Hypothyroidism.  3. Hypertension.   SOCIAL HISTORY:  The patient is a nonsmoker.  He does not drink alcohol  to excess.   FAMILY HISTORY:  His father died of a myocardial infarction at age 77.   REVIEW OF  SYSTEMS:  The patient describes dyspnea and dyspnea with  exertion.  He denies any chest pain.  He denies any blood in his urine  or blood in his stool.  He denies any syncope or presyncope.  He denies  any rash or skin nodules.  He denies any visual problems.  All other  systems were reviewed and are negative.   PHYSICAL EXAMINATION:  GENERAL:  He is an elderly gentleman in no acute  distress.  He is alert and oriented x3 and his mood and affect are  normal.  VITAL SIGNS:  His weight is 210.  Blood pressure is 110/70 with a heart  rate of 64.  HEENT:  2+ carotids.  He has no bruits, no JVD.  His sclerae are  nonicteric.  His mucous membranes are moist.  NECK:  Supple.  LUNGS:  Clear.  HEART:  Regular rate.  S1, S2.  He has a 2-3/6 systolic ejection murmur  radiating out to the axilla.  His PMI is nondisplaced.  His chest wall  is nontender.  ABDOMINAL:  Good bowel sounds and is nontender.  There is no  hepatosplenomegaly.  There is no guarding or rebound.  EXTREMITIES:  He has good pulses.  There are no palpable cords.  There  are no rashes.  There is no edema.  NEUROLOGIC:  Nonfocal.  Cranial nerves II-XII are intact and his motor  and sensory function are intact.  His gait is normal.   I have reviewed the EKG from Oct 22, 2008.  He has sinus bradycardia.  He has a right bundle-branch block.  He has rare premature ventricular  contractions.  I have reviewed his echocardiogram.  He has significant  mitral regurgitation with left atrial enlargement.   I have reviewed his laboratory data.  His BNP is 212.  His electrolytes  and liver function tests are within normal limits.  His hemoglobin is  actually a little high at 15.6.   Adrian West presents with worsening mitral regurgitation and evidence that  his heart is becoming a little bit large.  I agree with Dr. Patty Sermons  that cardiac catheterization is indicated.  I suspect that he will need  to have a mitral valve repair.  We will  schedule his heart  catheterization next week.  We have discussed the risks, benefits, and  options of heart catheterization.  He understands and agrees to proceed.      Vesta Mixer, M.D.  Electronically Signed     PJN/MEDQ  D:  10/27/2008  T:  10/28/2008  Job:  [id number]   cc:   Cassell Clement, M.D.

## 2010-10-19 NOTE — Op Note (Signed)
NAMEISOM, KOCHAN NO.:  [id number]   MEDICAL RECORD NO.:  [id number]          PATIENT TYPE:  INP   LOCATION:  2301                         FACILITY:  [facility]   PHYSICIAN:  Guadalupe Maple, M.D.  DATE OF BIRTH:  [birth year]   DATE OF PROCEDURE:  DATE OF DISCHARGE:                               OPERATIVE REPORT   PROCEDURE:  Intraoperative transesophageal echocardiography.   [name] is a 77 year old white male with a history of severe  mitral regurgitation, who is scheduled to undergo mitral valve repair by  Dr. Cornelius Moras.  The intraoperative transesophageal echocardiography was  requested to evaluate the mitral valve to assess the mechanism of  regurgitation and to assist with valve repair and to assess for any  other valvular pathology and to serve as a monitor for intraoperative  volume status.   The patient was brought to the operating room at [facility] and  general anesthesia was induced without difficulty.  The trachea was  intubated without difficulty.  The transesophageal echocardiography  probe was then inserted into the esophagus without difficulty.   IMPRESSION:  Prebypass Findings:  1. Aortic valve.  The aortic valve was trileaflet.  The leaflets      opened well and there was no aortic insufficiency.  2. Mitral valve.  Both mitral leaflets were thickened and redundant.      The posterior leaflet was prolapsing in the area of P2 and P3.  It      was suggestive of possible torn chordae in the P3 region, but this      could not be clearly confirmed.  There was a jet of mitral      insufficiency in the P2-P3 region which was anteriorly directed      along the anterior surface of the mitral valve.  There was also a      central jet of mitral regurgitation along the coaptation line in      the P2 and P1 regions.  3. Left ventricle.  The left ventricular cavity was moderately dilated      and measured 4.7 cm at the end diastole at the mid  papillary level.      Left ventricular wall thickness measured 1.1-1.2 cm concentrically.      There was no thrombus noted in the left ventricular apex.  There      was good contractility in all segments interrogated.  Ejection      fraction was estimated at 55-60%.  4. Right ventricle.  The right ventricular function appeared to be      normal.  There was good contractility of the right ventricular free      wall.  5. Tricuspid valve.  The tricuspid valve appeared structurally intact      with 1+ tricuspid insufficiency.  6. Interatrial septum.  The interatrial septum was intact without      evidence of patent foramen or atrial septal defect by color Doppler      or bubble study.  7. Left atrium.  The left atrial cavity showed no thrombus in the left  atrium or left atrial appendage.  8. Ascending aorta.  The ascending aorta showed no evidence of severe      atheromatous disease and there appeared to be a well-defined      sinotubular ridge.   Postbypass Findings:  1. Aortic valve.  The aortic valve was unchanged from the prebypass      study.  There was no aortic insufficiency and the leaflets opened      well.  2. Mitral valve.  There was an annuloplasty ring in the mitral      position.  Both leaflets were mobile and appeared to open well.      There was no aortic insufficiency that could be appreciated.  The      aortic valve area using the pressure half-time method was 2.6 cm2.      The mean gradient across the mitral valve was 5 mmHg.  These      findings were confirmed by 2-D and 3-D imaging.  3. Left ventricle.  Left ventricular function again appeared to be      good.  There was some dyssynchrony due to ventricular pacing      initially, but with atrial pacing there appeared to be good      contractility in all segments      interrogated and ejection fraction was estimated 55-60%.  4. Right ventricle.  The right ventricular function appeared unchanged      from the  prebypass study with good contractility of the right      ventricular free wall.           ______________________________  Guadalupe Maple, M.D.     DCJ/MEDQ  D:  11/25/2008  T:  11/26/2008  Job:  [id number]

## 2010-10-20 ENCOUNTER — Ambulatory Visit
Admission: RE | Admit: 2010-10-20 | Discharge: 2010-10-20 | Disposition: A | Discharge status: Home / Self Care | Payer: Medicare Other | Source: Ambulatory Visit | Attending: Neurology | Admitting: Neurology

## 2010-10-20 DIAGNOSIS — R42 Dizziness and giddiness: Secondary | ICD-10-CM

## 2010-10-20 DIAGNOSIS — G459 Transient cerebral ischemic attack, unspecified: Secondary | ICD-10-CM

## 2010-10-20 DIAGNOSIS — R209 Unspecified disturbances of skin sensation: Secondary | ICD-10-CM

## 2010-10-20 DIAGNOSIS — R0602 Shortness of breath: Secondary | ICD-10-CM

## 2010-10-22 NOTE — Assessment & Plan Note (Signed)
[facility]                         GASTROENTEROLOGY OFFICE NOTE   FELIX, MERAS                 MRN:          [id number]  DATE:05/11/2006                            DOB:          09-26-1927    Mr. Kolek has had 72 hours of crampy abdominal pain and diarrhea without  fever or chills or other systemic complaints. He has had no known  infectious disease exposure, there have been no sick family members. He  has not had foreign travel, antibiotics or new medications. He currently  is getting better on his own. He continues to have some crampy abdominal  pain and occasional loose stools without blood in his stools. He does  have known diverticulosis but never had previous diverticulitis. He  otherwise has been doing well and review of systems is otherwise  noncontributory.   PHYSICAL EXAMINATION:  GENERAL:  Exam shows him to be a slightly  dehydrated but nontoxic-appearing white male appearing younger than his  stated age.  VITAL SIGNS:  He weights 201 pounds, the blood pressure is 128/60, pulse  was 66 and regular.  HEENT:  He was not icteric and there were no rashes or swollen joints  noted.  CHEST:  Clear. There were no murmur, gallop or rub. He was in a regular  rhythm.  ABDOMEN:  Entirely benign without organomegaly, masses, tenderness or  distention. Bowel sounds were present.  EXTREMITIES:  Peripheral extremities were unremarkable.  RECTUM:  Inspection of the rectum was unremarkable as was the rectal  exam. There was some partially-formed scant stool in the rectal vault  that was guaiac negative.  NEUROLOGIC:  Mental status was clear.   ASSESSMENT:  I think Mr. Nabers most likely has had a bacterial enteritis-  colitis which is currently resolving. He is mildly dehydrated.   RECOMMENDATIONS:  1. Check CBC and metabolic profile.  2. Push p.o. fluids with electrolytes such as Gatorade.  3. Empiric Cipro 500 mg twice a day for 5  days.  4. Low fiber lactose-free diet as tolerated.  5. P.R.N. GI followup as needed.     Vania Rea. Jarold Motto, MD, Caleen Essex, FAGA  Electronically Signed    DRP/MedQ  DD: 05/12/2006  DT: 05/12/2006  Job #: [id number]

## 2010-10-22 NOTE — Letter (Signed)
August 31, 2006    Cassell Clement, M.D.  [address], [address]  [place], [place] [address]   RE:  Adrian West, Adrian West  MRN:  [id number]  /  DOB:  02-24-1928   Dear Elijah Birk,   I saw Dorien Chihuahua for a physical examination on August 31, 2006.  He  continues to have some balance dysfunction, which appears to be benign  positional vertigo.  He was instructed in positioning maneuvers by a  physical therapist up in [place], and has been using those maneuvers with  positive results.  On exam, he had minimal non-sustained nystagmus with  lateral gaze bilaterally.  Air conduction was greater than bone  conduction, and there was no lateralization with a tuning fork.  Cranial  nerve exam was normal.  If symptoms persist, I would recommend  evaluation by an otolaryngologist or possibly a neurologist if symptoms  progress.He also has epicondylitis, for which he has seen Ron Darrelyn Hillock  and had injections.  Neither this nor physical therapy was of benefit.  He has seen [employer], who has recommended surgery, but he is reluctant  to do so.  His only compromise is he can not play golf.  I have asked  him to consider what the experts have told him, and then make a  decision.  Certainly, there is no contraindication to surgery, based on  my findings.   He does have a crescendo decrescendo murmur at the apex.  His EKG showed  a sinus bradycardia with a rate of 55.  I will send you a copy of the  EKG, because there is a questionable rhythm change in the rhythm strip,  which raised the possibility of Wenkebach  phenomena.  I refer to  your expertise as to continuing the beta blocker  and also as to the need for SBE prophylaxis.   He should also have screening for osteoporosis if this has not been done  by either Orthopedist  Thank you for continuing to be involved in his care.  Respectfully.    Sincerely,      Titus Dubin. Alwyn Ren, MD,FACP,FCCP  Electronically Signed    WFH/MedQ  DD: 08/31/2006  DT:  08/31/2006  Job #: [id number]

## 2010-10-22 NOTE — Op Note (Signed)
NAME:  Adrian West, Adrian West                    ACCOUNT NO.:  [id number]   MEDICAL RECORD NO.:  [id number]                   PATIENT TYPE:  AMB   LOCATION:  DAY                                  FACILITY:  [facility]   PHYSICIAN:  Lorre Munroe., M.D.            DATE OF BIRTH:  [birth year]   DATE OF PROCEDURE:  03/21/2003  DATE OF DISCHARGE:                                 OPERATIVE REPORT   PREOPERATIVE DIAGNOSIS:  Symptomatic gallstones.   POSTOPERATIVE DIAGNOSIS:  Symptomatic gallstones.   OPERATION:  1. Laparoscopic cholecystectomy.  2. Repair of umbilical hernia.   SURGEON:  Lebron Conners, M.D.   ANESTHESIA:  General.   DESCRIPTION OF PROCEDURE:  After the patient was monitored and anesthetized  and had routine preparation and draping of the abdomen, I made a short  transverse infraumbilical incision and dissected down to the midline fascia.  There was an umbilical hernia present. I incised into the hernia and then  slightly caudad to it exposing the retroperitoneal space. I anesthetized the  area with long acting local anesthetic and also anesthetized all of the  remaining incisions which I subsequently made. I bluntly entered the  peritoneum and then placed a #0 Vicryl pursestring suture in the fascia and  secured a Hasson cannula. I then inspected the peritoneal cavity and saw no  abnormalities except for adhesions of the omentum to the undersurface of the  gallbladder and the liver. After placement of three additional ports and  positioning of the patient head up foot down and tilted toward the left, I  then grasped the fundus of the gallbladder and elevated it toward the right  shoulder. There were rather dense adhesions of omentum to the undersurface  of the liver which I took down with the cautery and with the scissors and  then stripped the adhesions off the inferior surface of the gallbladder. I  then grasped the infundibulum of the gallbladder with another grasper  and  pulled it laterally and dissected in the hepatoduodenal ligament until I  clearly identified the cystic duct and the cystic artery. I dissected until  I could see the cystic duct quite clearly emerging from the infundibulum of  the gallbladder and going into the common bile duct. I clipped the cystic  duct with four clips and cut between the two closest to the gallbladder and  then I clipped the cystic artery with three clips and cut between the two  closest to the gallbladder. Using the cautery and the hook dissector, I  dissected the gallbladder from its fossa and gained hemostasis with the  cautery. There was very little bleeding and no evident leakage of bile. No  accessory ducts were seen. I then removed the gallbladder from the body  through the umbilical incision. I allowed the CO2 to escape after removing  the lateral ports under direct vision and then I removed the epigastric  port. I enlarged my incision slightly to  be able to fully dissect out the  umbilical hernia and I dissected the subcutaneous fat away from the hernia  in all directions and closed it with a running #0 Prolene suture  incorporating the incision for placement of the camera in the closure. The  sponge, needle and instrument counts were correct. I closed all of the  incisions with intracuticular 4-0 Vicryl and Steri-Strips. He tolerated the  operation well.                                              Lorre Munroe., M.D.   WB/MEDQ  D:  03/21/2003  T:  03/21/2003  Job:  [id number]

## 2010-12-06 ENCOUNTER — Ambulatory Visit (INDEPENDENT_AMBULATORY_CARE_PROVIDER_SITE_OTHER): Payer: Medicare Other | Admitting: Family Medicine

## 2010-12-06 ENCOUNTER — Encounter: Payer: Self-pay | Admitting: Family Medicine

## 2010-12-06 VITALS — BP 120/80 | Temp 97.4°F | Wt 191.4 lb

## 2010-12-06 DIAGNOSIS — R21 Rash and other nonspecific skin eruption: Secondary | ICD-10-CM | POA: Insufficient documentation

## 2010-12-06 MED ORDER — METHYLPREDNISOLONE ACETATE 80 MG/ML IJ SUSP
80.0000 mg | Freq: Once | INTRAMUSCULAR | Status: AC
  Administered 2010-12-06: 80 mg via INTRAMUSCULAR

## 2010-12-06 MED ORDER — PREDNISONE 20 MG PO TABS: ORAL_TABLET | ORAL | Status: DC

## 2010-12-06 NOTE — Assessment & Plan Note (Signed)
depomedrol injxn given in the office.  Pt to start oral prednisone for sxs relief tomorrow.  Stressed that he must restart his dig and continue his Synthroid.  Will defer other medications to f/u w/ PCP next week.  Reviewed supportive care and red flags that should prompt return.  Pt expressed understanding and is in agreement w/ plan.

## 2010-12-06 NOTE — Patient Instructions (Signed)
Please schedule a follow up w/ Dr Alwyn Ren next week to f/u rash and discuss breathing issues Take the Prednisone starting tomorrow- 2 pills at the same time (take w/ food to avoid upset stomach) Use hydrocortisone cream as needed for the itching Restart the Digoxin and continue the synthroid Call with any questions or concerns Hang in there!

## 2010-12-06 NOTE — Progress Notes (Signed)
  Subjective:    Patient ID: Adrian West, male    DOB: [birth year], 77 y.o.   MRN: [id number]  HPI Rash- occuring on head and chest.  This is 4th day.  Had something similar 30 days ago.  Had TIA in May.  Stopped all meds except Synthroid 3 days ago b/c pt saw 3 different MDs (socially) and they feel sxs are a drug reaction.  Rash is very itchy.  Improves when showering.   Review of Systems For ROS see HPI     Objective:   Physical Exam  Constitutional: He appears well-developed and well-nourished. No distress.  Skin: Skin is warm and dry. Rash (erythematous, macular rash on head, face, neck, and upper chest.  easily blanching.) noted.          Assessment & Plan:

## 2010-12-11 ENCOUNTER — Encounter: Payer: Self-pay | Admitting: Internal Medicine

## 2010-12-13 ENCOUNTER — Encounter: Payer: Self-pay | Admitting: Internal Medicine

## 2010-12-13 ENCOUNTER — Ambulatory Visit (INDEPENDENT_AMBULATORY_CARE_PROVIDER_SITE_OTHER): Payer: Medicare Other | Admitting: Internal Medicine

## 2010-12-13 DIAGNOSIS — R0989 Other specified symptoms and signs involving the circulatory and respiratory systems: Secondary | ICD-10-CM

## 2010-12-13 DIAGNOSIS — R0609 Other forms of dyspnea: Secondary | ICD-10-CM

## 2010-12-13 NOTE — Patient Instructions (Signed)
Please share report with Dr Sandria Manly & Dr Covert

## 2010-12-13 NOTE — Progress Notes (Signed)
  Subjective:    Patient ID: Adrian West, male    DOB: March 20, 1928, 77 y.o.   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 DOE after 50 ft       Review of Systems     Objective:   Physical Exam is in no acute distress; and no increased work of breathing at rest.  His chest is surprisingly clear without rales, rhonchi, or wheezing  Heart rhythm is regular; is a grade 1 systolic murmur.  There is no neck vein distention at 15. There is no hepatojugular reflux. He has no clubbing or cyanosis.  Pulses are intact without bruits.           Assessment & Plan:  #1 dizziness, postural occurring mainly when standing. No postural blood pressure changes.  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 No oxygen desaturation noted.  Plan: Day will be shared with Dr. love, his neurologist.  More sophisticated pulmonary stress test may be employed by Dr. Marchelle Gearing

## 2011-02-14 ENCOUNTER — Ambulatory Visit (INDEPENDENT_AMBULATORY_CARE_PROVIDER_SITE_OTHER): Payer: Medicare Other | Admitting: Internal Medicine

## 2011-02-14 ENCOUNTER — Encounter: Payer: Self-pay | Admitting: Internal Medicine

## 2011-02-14 VITALS — BP 112/70 | HR 66 | Temp 97.9°F | Ht 69.75 in | Wt 192.0 lb

## 2011-02-14 DIAGNOSIS — R0609 Other forms of dyspnea: Secondary | ICD-10-CM

## 2011-02-14 DIAGNOSIS — J986 Disorders of diaphragm: Secondary | ICD-10-CM

## 2011-02-14 DIAGNOSIS — R0989 Other specified symptoms and signs involving the circulatory and respiratory systems: Secondary | ICD-10-CM

## 2011-02-14 DIAGNOSIS — R0602 Shortness of breath: Secondary | ICD-10-CM

## 2011-02-14 DIAGNOSIS — M549 Dorsalgia, unspecified: Secondary | ICD-10-CM

## 2011-02-14 DIAGNOSIS — I080 Rheumatic disorders of both mitral and aortic valves: Secondary | ICD-10-CM

## 2011-02-14 NOTE — Assessment & Plan Note (Signed)
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 Shirlee Latch comes up with and in addition see what Dr. Katrine Coho at [facility] says  - my nurse will give you printed copy of breathing test - you can discuss with Dr Katrine Coho about a third opinion for paralyzed diaphragm  - I will see you back in 6-8 weeks - call or return sooner if worse - nurse will refer you to physical therapy for your back - Mr Art , [place]   > 25 minutes spent in discussion. Of this > 50% in face to face counseling

## 2011-02-14 NOTE — Assessment & Plan Note (Deleted)
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 Mclean comes up with and in addition see what Dr. Govert at [facility] says  - my nurse will give you printed copy of breathing test - you can discuss with Dr Govert about a third opinion for paralyzed diaphragm  - I will see you back in 6-8 weeks - call or return sooner if worse - nurse will refer you to physical therapy for your back - Mr Art , [place]   > 25 minutes spent in discussion. Of this > 50% in face to face counseling  

## 2011-02-14 NOTE — Progress Notes (Signed)
Subjective:    Patient ID: Adrian West, male    DOB: 27-Jun-1927, 77 y.o.   MRN: [id number]  HPI Problem list:  1. Dyspnea  - class 3-4 folowing Mitral valve surgery June 2010 and resultant right diaphragm paralysis. No improvement with rehab.  2. Restricted PFTs out of pproportion to above. Seen by Dr. Katrine Coho at [facility] 2011 - suspects associated loss of height and ? etoh neuropathy (drinks 1-3 glasses wine nightly) playing a role.   OV 02/14/11: Followup for above. Last seen 08/03/10. Since then he feels dyspnea is worse. STates brough on by exertion and relieved by rest. Feels dyspneic at rest too. He feels he is dyspneic sitting in office and talking to me. Feels only 50% of lung function. This morning he told his PMD, dyspnea is stable and even at last visit he stated to me that he feels only at 50% but nevertheless he tells me he has declined in past year. Hitting golf ball 30 yards less than last summer. He is wondering if he is hypoxemic.  Due to see Dr. Katrine Coho in few weeks. Of note, when we exerted him in office - 185 x 3 laps; he did not desaturate but HR rose to 160/min. Spirometry Fev1 1.3L/41%, FVC 2.4L/57%,  Ratio 54% - this is similar to before though I cannot remember if there was obstructive component  PMhx reviewed: started on aggrenox today by Dr. Sandria Manly his neurologist for TIA 3 months ago. Ongoing vertigo present Social hx: unchanged. Wife with copd - stable Family hx: no change  Past Medical History  Diagnosis Date  . Thyroid disease   . Hypertension   . GERD (gastroesophageal reflux disease)     PMH of stricture  . Benign neoplasm of colon   . Tremor, essential   . Neuropathy, peripheral   . Diverticulosis   . Skin cancer   . TIA (transient ischemic attack) May 2012     Family History  Problem Relation Age of Onset  . Heart attack Father 79  . Colon cancer Mother   . Heart attack Maternal Grandmother 60  . Uterine cancer Paternal Grandmother   . Stroke  Paternal Uncle      History   Social History  . Marital Status: Married    Spouse Name: N/A    Number of Children: N/A  . Years of Education: N/A   Occupational History  . Not on file.   Social History Main Topics  . Smoking status: Never Smoker   . Smokeless tobacco: Not on file  . Alcohol Use: 10.2 oz/week    17 Glasses of wine per week  . Drug Use: No  . Sexually Active: Not on file   Other Topics Concern  . Not on file   Social History Narrative  . No narrative on file     Allergies  Allergen Reactions  . Plavix (Clopidogrel Bisulfate) Rash     Outpatient Prescriptions Prior to Visit  Medication Sig Dispense Refill  . digoxin (LANOXIN) 0.125 MG tablet Take 1 tablet (125 mcg total) by mouth daily.  90 tablet  3  . levothyroxine (SYNTHROID, LEVOTHROID) 125 MCG tablet Take 125 mcg by mouth daily.        . clopidogrel (PLAVIX) 75 MG tablet Take 1 tablet (75 mg total) by mouth daily.  30 tablet  1       Review of Systems  Constitutional: Negative.  Negative for fever and unexpected weight change.  HENT: Negative.  Negative for ear pain, nosebleeds, congestion, sore throat, rhinorrhea, sneezing, trouble swallowing, dental problem, postnasal drip and sinus pressure.   Eyes: Negative.  Negative for redness and itching.  Respiratory: Positive for shortness of breath. Negative for cough, chest tightness and wheezing.   Cardiovascular: Negative.  Negative for palpitations and leg swelling.  Gastrointestinal: Negative.  Negative for nausea and vomiting.  Genitourinary: Negative.  Negative for dysuria.  Musculoskeletal: Negative.  Negative for joint swelling.  Skin: Negative.  Negative for rash.  Neurological: Negative.  Negative for headaches.  Hematological: Negative.  Does not bruise/bleed easily.  Psychiatric/Behavioral: Negative.  Negative for dysphoric mood. The patient is not nervous/anxious.        Objective:   Physical Exam  Nursing note and vitals  reviewed. Constitutional: He is oriented to person, place, and time. He appears well-developed and well-nourished. No distress.  HENT:  Head: Normocephalic and atraumatic.  Right Ear: External ear normal.  Left Ear: External ear normal.  Mouth/Throat: Oropharynx is clear and moist. No oropharyngeal exudate.  Eyes: Conjunctivae and EOM are normal. Pupils are equal, round, and reactive to light. Right eye exhibits no discharge. Left eye exhibits no discharge. No scleral icterus.  Neck: Normal range of motion. Neck supple. No JVD present. No tracheal deviation present. No thyromegaly present.  Cardiovascular: Normal rate, regular rhythm and intact distal pulses.  Exam reveals no gallop and no friction rub.   No murmur heard. Pulmonary/Chest: Effort normal and breath sounds normal. No respiratory distress. He has no wheezes. He has no rales. He exhibits no tenderness.       Diminished breath sounds in right back  Abdominal: Soft. Bowel sounds are normal. He exhibits no distension and no mass. There is no tenderness. There is no rebound and no guarding.  Musculoskeletal: Normal range of motion. He exhibits no edema and no tenderness.       Stooped gait Scar in lower back of back surgery  Lymphadenopathy:    He has no cervical adenopathy.  Neurological: He is alert and oriented to person, place, and time. He has normal reflexes. No cranial nerve deficit. Coordination normal.       Dysarthric handwriting  Skin: Skin is warm and dry. No rash noted. He is not diaphoretic. No erythema. No pallor.  Psychiatric: He has a normal mood and affect. His behavior is normal. Judgment and thought content normal.          Assessment & Plan:

## 2011-02-14 NOTE — Progress Notes (Signed)
  Subjective:    Patient ID: Adrian West, male    DOB: 04-24-1928, 77 y.o.   MRN: [id number]  HPI Dyspnea in context of paralyzed diaphragm since MV surgery 2010: Character: even @ rest ; able to walk 50 ft before resting, stable over past year Cough:no Sputum production:no Hemoptysis:no Wheezing:no Chest pain, edema, palpitations:no Treatment/efficacy:Pul Rehab w/o benefit. Not on  rescue inhaler or  maintenance inhaler.PFTs 2011: no RAD Smoking:never Past medical history: Asthma: no; Seasonal  Allergies: no Family history pulmonary disease: no  High function tests were done in July, 2011. Unfortunately I  was unable to retrieve these results from EMR. He is scheduled to see Dr. Marchelle Gearing today at 1:30.    Review of Systems Dr. Sandria Manly  has prescribed Aggrenox because of the PMH of  TIAs. Plavix caused a rash.     Objective:   Physical Exam  He has no increased work of breathing. Breath sounds are decreased at the right base as is tactile fremitus. Surprisingly by percussion and hand placement, there appeared to be slight diaphragmatic motion  on the right.  He has a grade 1 systolic murmur at the left base; rhythm is regular without gallop.  He has no neck vein distention or hepatojugular reflux  He has no cyanosis, clubbing or edema.  All pulses are intact without bruits.        Assessment & Plan:  #1 diaphragmatic paralysis  #2 dyspnea, secondary to #1. By history this appears to be stable  #3 past medical history mitral regurgitation, status post surgical correction. Clinically valvular  function is good  Plan: See orders and recommendations. I have asked him to obtain a copy of the Pulmonary FunctionTests for his home file to aid in continuity of care.

## 2011-02-14 NOTE — Patient Instructions (Addendum)
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 Shirlee Latch comes up with and in addition see what Dr. Katrine Coho at [facility] says  - my nurse will give you printed copy of breathing test - you can discuss with Dr Katrine Coho about a third opinion for paralyzed diaphragm  - I will see you back in 6-8 weeks - call or return sooner if worse - nurse will refer you to physical therapy for your back - Mr Art , a german guy in [place]

## 2011-02-14 NOTE — Patient Instructions (Signed)
As discussed, please obtain a copy of pulmonary function tests for your reference file. This will enhance continuity of care.

## 2011-02-17 ENCOUNTER — Telehealth: Payer: Self-pay | Admitting: Cardiology

## 2011-02-17 NOTE — Telephone Encounter (Signed)
Adrian West from [employer] states the patient would like to change physicians from Dr. Patty Sermons to Dr. Shirlee Latch and the office wants to make sure this is ok with Dr. Patty Sermons.  Please call Adrian West back to let know.

## 2011-02-17 NOTE — Telephone Encounter (Signed)
Ok to change per  Dr. Patty Sermons.  Left message with Marcelino Duster and sent her message

## 2011-02-22 ENCOUNTER — Other Ambulatory Visit: Payer: Self-pay | Admitting: Dermatology

## 2011-03-07 ENCOUNTER — Institutional Professional Consult (permissible substitution): Payer: Medicare Other | Admitting: Cardiology

## 2011-03-16 LAB — URINALYSIS, ROUTINE W REFLEX MICROSCOPIC
Glucose, UA: NEGATIVE
Hgb urine dipstick: NEGATIVE
Specific Gravity, Urine: 1.014
Urobilinogen, UA: 0.2

## 2011-03-16 LAB — COMPREHENSIVE METABOLIC PANEL
ALT: 17
AST: 19
Albumin: 3.5
BUN: 15
CO2: 25
CO2: 27
Calcium: 8.7
Chloride: 105
Creatinine, Ser: 0.93
Creatinine, Ser: 1.06
GFR calc Af Amer: >60
GFR calc non Af Amer: >60
GFR calc non Af Amer: >60
Glucose, Bld: 108 — ABNORMAL HIGH
Sodium: 136
Total Bilirubin: 1.4 — ABNORMAL HIGH

## 2011-03-16 LAB — POCT CARDIAC MARKERS
CKMB, poc: 2
Operator id: 1211
Operator id: 3206
Troponin i, poc: <0.05

## 2011-03-16 LAB — BASIC METABOLIC PANEL
BUN: 15
Chloride: 104
Creatinine, Ser: 1.08
GFR calc non Af Amer: >60
Glucose, Bld: 105 — ABNORMAL HIGH

## 2011-03-16 LAB — DIFFERENTIAL
Basophils Absolute: 0.1
Basophils Relative: 1
Lymphocytes Relative: 38
Neutro Abs: 4.7
Neutrophils Relative %: 58

## 2011-03-16 LAB — CK TOTAL AND CKMB (NOT AT ARMC)
CK, MB: 2.9
Relative Index: INVALID
Relative Index: INVALID
Total CK: 55

## 2011-03-16 LAB — CBC
HCT: 43.1
Hemoglobin: 14.8
MCV: 92.1
Platelets: 236
RBC: 4.68
WBC: 8.1

## 2011-03-16 LAB — TSH: TSH: 4.902

## 2011-03-21 ENCOUNTER — Ambulatory Visit: Payer: Medicare Other | Admitting: Internal Medicine

## 2011-03-29 ENCOUNTER — Encounter: Payer: Self-pay | Admitting: Cardiology

## 2011-04-01 ENCOUNTER — Ambulatory Visit (INDEPENDENT_AMBULATORY_CARE_PROVIDER_SITE_OTHER): Payer: Medicare Other | Admitting: Internal Medicine

## 2011-04-01 ENCOUNTER — Encounter: Payer: Self-pay | Admitting: Internal Medicine

## 2011-04-01 ENCOUNTER — Telehealth: Payer: Self-pay | Admitting: Internal Medicine

## 2011-04-01 VITALS — BP 110/80 | HR 69 | Temp 98.1°F | Ht 69.0 in | Wt 193.0 lb

## 2011-04-01 DIAGNOSIS — I495 Sick sinus syndrome: Secondary | ICD-10-CM

## 2011-04-01 DIAGNOSIS — R0989 Other specified symptoms and signs involving the circulatory and respiratory systems: Secondary | ICD-10-CM

## 2011-04-01 DIAGNOSIS — R0609 Other forms of dyspnea: Secondary | ICD-10-CM

## 2011-04-01 DIAGNOSIS — R000 Tachycardia, unspecified: Secondary | ICD-10-CM | POA: Insufficient documentation

## 2011-04-01 NOTE — Patient Instructions (Addendum)
Shortness of breath  - on exam and breathing test last visit - it appears that the right diaphragm is still paralyzed and there is an element of obstruction but inhalers have not helped in past for you. So, I will hold off on inhaler for now till you see cardiology for heart rate - when we walked you again  today  Your heart rate rose to 150/min on our monitor  - Also, when we stood you up your heart rate dropped to 36 and you got dizzy - So please see Dr Shirlee Latch (your wife's cardiologist) or Dr Graciela Husbands cardiology for evaluation - ASAP; Dr Patty Sermons ok with switch to Dr Shirlee Latch.  - I have spoken to Dr Graciela Husbands a heart electricity / rhytym doctor and the [facility] cardiology office should call you hopefully later today -  If you have not heard from cardiology by Monday, please call our office - return to see me in less than 1 month; after seeing Dr Shirlee Latch

## 2011-04-01 NOTE — Telephone Encounter (Signed)
Pt would like to talk with someone on Monday about being seen after testing (ECHO, 48hour monitor) before Friday with Dr. Graciela Husbands.  He understands these tests need to be read. Also that flying to [place] on Tuesday may not be in his best interest at this time until seen by MD. Mylo Red RN

## 2011-04-01 NOTE — Telephone Encounter (Signed)
I spoke with pt and he will come in on Monday for a 7:30am echo and 48hour monitor. The reason why pt cannot come in on Friday to see Dr. Graciela Husbands: He is flying to [place] on Tuesday. I have discussed at length the medical importance of heart rate,symptoms he is having and the need to evaluate this through testing. Pt is more understanding at just how serious this could be. I have also discussed the nature of the heart's own pacemaker and the need to evaluate his own electrical conduction system through this monitoring.  Pt says he could stay overnight Tuesday.  If at all possible he would like to be seen Monday or Tuesday by Dr. Graciela Husbands. Message left with falecha about echo added to Monday morning schedule. Mylo Red RN

## 2011-04-01 NOTE — Assessment & Plan Note (Signed)
Refer cards - see dyspnea section

## 2011-04-01 NOTE — Telephone Encounter (Signed)
Dr Graciela Husbands called to ask that this pt be scheduled 11-2 at 830a with an echo and heart monitor prior, sent ruth heart monitor request and echo available on 11-1, pt said he can't come he won't be here and that he wants it all done on Monday, and he states he was to be seen be dr Shirlee Latch, which he had an appt 10-1 that he missed and he is also booked, pls advise

## 2011-04-01 NOTE — Telephone Encounter (Signed)
Reviewed his conversation with cardiology. Called and left message on his phone with the following specific info A. No golf, no over-exertion B. No going to [place] till cards workup complete C. He needs to see DR Graciela Husbands or whoever attending they get in first with. Since the ball is already rolling with Dr Graciela Husbands he should see Dr Freddi Che, please ensure that he is following through with cards on Monday 04/04/11 am

## 2011-04-01 NOTE — Assessment & Plan Note (Signed)
I think current dyspnea is related to significant tachycardia with even simple exertion. This is over and above rigght dipahragm paralysis. This is new since atleast sept 2012 becuaes April 2011 CPST did not show this. He missed appt with Dr Shirlee Latch (his wife's cardiologist) in sept 2012. Apparently Dr Shirlee Latch is not in office next week. I called Dr Berton Mount and d/w on phone and he said he will arrange for holter and see patient asap

## 2011-04-01 NOTE — Progress Notes (Signed)
Subjective:    Patient ID: Adrian West, male    DOB: Apr 28, 1928, 77 y.o.   MRN: [id number]  HPI roblem list:  1. Dyspnea  - class 3-4 following Mitral valve surgery June 2010 and resultant right diaphragm paralysis. No improvement with rehab.  2. Restricted PFTs out of pproportion to above. Seen by Dr. Katrine Coho at [facility] 2011 - suspects associated loss of height and ? etoh neuropathy (drinks 1-3 glasses wine nightly) playing a role.   OV 02/14/11: Followup for above. Last seen 08/03/10. Since then he feels dyspnea is worse. STates brough on by exertion and relieved by rest. Feels dyspneic at rest too. He feels he is dyspneic sitting in office and talking to me. Feels only 50% of lung function. This morning he told his PMD, dyspnea is stable and even at last visit he stated to me that he feels only at 50% but nevertheless he tells me he has declined in past year. Hitting golf ball 30 yards less than last summer. He is wondering if he is hypoxemic.  Due to see Dr. Katrine Coho in few weeks. Of note, when we exerted him in office - 185 x 3 laps; he did not desaturate but HR rose to 160/min. Spirometry Fev1 1.3L/41%, FVC 2.4L/57%,  Ratio 54% - this is similar to before though I cannot remember if there was obstructive component    OV 04/01/11: Followup dyspnea as above. Supppsed to have seen Dr Shirlee Latch for exertional tachycardia; got cleared by Dr Patty Sermons to switch to Dr Shirlee Latch his wife's cardiologist but he is yet to see Dr Shirlee Latch for unclear reasons. He did followup with  Dr Katrine Coho of pulmonary at [facility] and has been told that diaphragm paralysis is probably irreversible and conservative measures advised (no note yet from [facility]). He continues to play golf twice a week and reports good spirits but is bothered by the dyspnea. Still c/o persistent class 3-4 dyspnea. Very frustrating. Also has ongoing vertigo.  He again demonstrates heart rate variability. Got very tachycardic with walking in office and  bradycardic with orthostatics.  Prior to th September visit I do not recolllect this tachycardia. Never informed of it by the rehab nurses in past. He has had CPST 09/07/09 and his peak HR was only 115/min at that time.  He denies any neuromuscular symptoms  Specificially, at rest pulse ox 93%/HR 72 and we walked him 185 feet. Pulse ox stayed at 92 but HR rose to 150/min. Checked orthostats SUPINE: BP 122/82, HR 66, pulse ox 94%, -> STANDING BP 128/88, HR DRPOPPED TO 36 and he got DIZZY, and pulse ox 98%  OV 04/01/11:    Review of Systems  Constitutional: Negative for fever and unexpected weight change.  HENT: Negative for ear pain, nosebleeds, congestion, sore throat, rhinorrhea, sneezing, trouble swallowing and postnasal drip.   Eyes: Negative for redness and itching.  Respiratory: Positive for chest tightness and shortness of breath. Negative for cough and wheezing.   Cardiovascular: Positive for leg swelling. Negative for palpitations.  Gastrointestinal: Negative for nausea and vomiting.  Genitourinary: Negative for dysuria.  Musculoskeletal: Negative for joint swelling.  Skin: Negative for rash.  Neurological: Negative for headaches.  Hematological: Does not bruise/bleed easily.  Psychiatric/Behavioral: Negative for dysphoric mood. The patient is not nervous/anxious.        Objective:   Physical Exam  Nursing note and vitals reviewed. Constitutional: He is oriented to person, place, and time. He appears well-developed and well-nourished. No distress.  HENT:  Head: Normocephalic and atraumatic.  Right Ear: External ear normal.  Left Ear: External ear normal.  Mouth/Throat: Oropharynx is clear and moist. No oropharyngeal exudate.  Eyes: Conjunctivae and EOM are normal. Pupils are equal, round, and reactive to light. Right eye exhibits no discharge. Left eye exhibits no discharge. No scleral icterus.  Neck: Normal range of motion. Neck supple. No JVD present. No tracheal deviation  present. No thyromegaly present.  Cardiovascular: Normal rate, regular rhythm and intact distal pulses.  Exam reveals no gallop and no friction rub.   No murmur heard. Pulmonary/Chest: Effort normal. No respiratory distress. He has no wheezes. He has no rales. He exhibits no tenderness.       Diminished air entry rt base  Abdominal: Soft. Bowel sounds are normal. He exhibits no distension and no mass. There is no tenderness. There is no rebound and no guarding.  Musculoskeletal: Normal range of motion. He exhibits no edema and no tenderness.       Walks with hunch  Lymphadenopathy:    He has no cervical adenopathy.  Neurological: He is alert and oriented to person, place, and time. He has normal reflexes. No cranial nerve deficit. Coordination normal.       Mild LUE tremor  Skin: Skin is warm and dry. No rash noted. He is not diaphoretic. No erythema. No pallor.  Psychiatric: He has a normal mood and affect. His behavior is normal. Judgment and thought content normal.          Assessment & Plan:

## 2011-04-03 NOTE — Telephone Encounter (Signed)
On 02/17/11 the patient had called to ask to be switched to Dr. Alford Highland service.

## 2011-04-04 ENCOUNTER — Ambulatory Visit (HOSPITAL_COMMUNITY): Payer: Medicare Other | Attending: Cardiology | Admitting: Radiology

## 2011-04-04 ENCOUNTER — Encounter (INDEPENDENT_AMBULATORY_CARE_PROVIDER_SITE_OTHER): Payer: Medicare Other

## 2011-04-04 DIAGNOSIS — I495 Sick sinus syndrome: Secondary | ICD-10-CM | POA: Insufficient documentation

## 2011-04-04 DIAGNOSIS — R0989 Other specified symptoms and signs involving the circulatory and respiratory systems: Secondary | ICD-10-CM | POA: Insufficient documentation

## 2011-04-04 DIAGNOSIS — I10 Essential (primary) hypertension: Secondary | ICD-10-CM | POA: Insufficient documentation

## 2011-04-04 DIAGNOSIS — Z8673 Personal history of transient ischemic attack (TIA), and cerebral infarction without residual deficits: Secondary | ICD-10-CM | POA: Insufficient documentation

## 2011-04-04 DIAGNOSIS — R000 Tachycardia, unspecified: Secondary | ICD-10-CM

## 2011-04-04 DIAGNOSIS — R0609 Other forms of dyspnea: Secondary | ICD-10-CM | POA: Insufficient documentation

## 2011-04-04 NOTE — Telephone Encounter (Signed)
Spoke with the pt and he states he has already seen cardiology and done a cardio test and is now wearing a holter monitor. He has an appt for f/u tomorrow with Dr. Shirlee Latch to go over the results. Pt is aware of recs to avoid golf, over exertions, and they have cancelled their [place] trip. [name], CMA

## 2011-04-04 NOTE — Telephone Encounter (Signed)
Awesome. Thanks. Dr Shirlee Latch texted me today as well that he is seeing patient today.

## 2011-04-05 ENCOUNTER — Ambulatory Visit (INDEPENDENT_AMBULATORY_CARE_PROVIDER_SITE_OTHER): Payer: Medicare Other | Admitting: Cardiology

## 2011-04-05 ENCOUNTER — Encounter: Payer: Self-pay | Admitting: Cardiology

## 2011-04-05 DIAGNOSIS — R0989 Other specified symptoms and signs involving the circulatory and respiratory systems: Secondary | ICD-10-CM

## 2011-04-05 DIAGNOSIS — R0602 Shortness of breath: Secondary | ICD-10-CM

## 2011-04-05 DIAGNOSIS — I493 Ventricular premature depolarization: Secondary | ICD-10-CM

## 2011-04-05 DIAGNOSIS — G459 Transient cerebral ischemic attack, unspecified: Secondary | ICD-10-CM

## 2011-04-05 DIAGNOSIS — I4949 Other premature depolarization: Secondary | ICD-10-CM

## 2011-04-05 HISTORY — DX: Ventricular premature depolarization: I49.3

## 2011-04-05 LAB — BASIC METABOLIC PANEL
Calcium: 9.1 mg/dL (ref 8.4–10.5)
GFR: 88.31 mL/min (ref >60.00)
Glucose, Bld: 99 mg/dL (ref 70–99)
Sodium: 140 mEq/L (ref 135–145)

## 2011-04-05 MED ORDER — METOPROLOL SUCCINATE ER 25 MG PO TB24: ORAL_TABLET | ORAL | Status: DC

## 2011-04-05 NOTE — Assessment & Plan Note (Addendum)
Given history of TIA, patient should be on a statin.  I will address this at a future appointment.    I spent > 40 minutes reviewing records and talking to patient.
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
## 2011-04-05 NOTE — Patient Instructions (Signed)
Your physician recommends that you schedule a follow-up appointment in: 2 weeks  Your physician has requested that you have en exercise stress myoview. For further information please visit [email or web address] Please follow instruction sheet, as given.   Your physician has recommended you make the following change in your medication:     Stop digoxin.    Start Toprol XL 25 mg by mouth twice daily.  You had lab work done today

## 2011-04-05 NOTE — Assessment & Plan Note (Addendum)
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  Per patient, this has been slowly progressive.  He is not volume overloaded on exam.  His PFTs were restrictive and seemed to be somewhat out of proportion to the diaphragmatic paralysis.  Echo showed normal EF and mitral valve repair appeared intact.  Based on the overall picture, his dyspnea would seem to be most likely to be due to lung mechanics.  However, frequent PVCs could certainly contribute by lowering cardiac output even without any affect so far on systolic function.  I will attempt to suppress PVCs, as above, with Toprol XL. Check BNP today.

## 2011-04-05 NOTE — Assessment & Plan Note (Signed)
Patient had very common PVCs on holter, 19% of total beats.  There was one primary morphology and an occasional second morphology.  He had short runs of NSVT.  Patient does not notice this arrhythmia: no palpitations, presyncope, or lightheadedness.  This finding is concerning on two fronts.  First, his PVC burden is approaching an amount that could lead to a cardiomyopathy with fall in EF (Normal systolic function on echo this week).  Secondly, I am not sure of the etiology of the PVCs.  As mentioned, EF is normal.  I do not know how long the PVCs have been present (he does not feel them).  He has had episodes of atypical chest pain but nothing exertional.  I think that the fall in heart rate noted by Dr. Marchelle Gearing when the patient stood up may have been due to ventricular bigeminy that was not picked up by the pulse ox (sinus beat and following PVC counted together).  He had a fair amount of bigeminy on the holter.  Patient had no bradycardia on the holter.  - Need to rule out ischemia: will get an ETT-myoview.  - Stop digoxin and start Toprol XL 25 mg bid to try to suppress the PVCs.  Will likely get a followup holter to see if the PVCs decrease on therapy.  I would like to try to decrease the frequency to decrease the risk of systolic dysfunction.  - BMET, magnesium level.  - If myoview is normal, would consider cardiac MRI.

## 2011-04-12 ENCOUNTER — Ambulatory Visit (HOSPITAL_COMMUNITY): Payer: Medicare Other | Attending: Internal Medicine | Admitting: Radiology

## 2011-04-12 DIAGNOSIS — I059 Rheumatic mitral valve disease, unspecified: Secondary | ICD-10-CM | POA: Insufficient documentation

## 2011-04-12 DIAGNOSIS — E785 Hyperlipidemia, unspecified: Secondary | ICD-10-CM | POA: Insufficient documentation

## 2011-04-12 DIAGNOSIS — R0609 Other forms of dyspnea: Secondary | ICD-10-CM

## 2011-04-12 DIAGNOSIS — R5383 Other fatigue: Secondary | ICD-10-CM | POA: Insufficient documentation

## 2011-04-12 DIAGNOSIS — R5381 Other malaise: Secondary | ICD-10-CM | POA: Insufficient documentation

## 2011-04-12 DIAGNOSIS — Z8249 Family history of ischemic heart disease and other diseases of the circulatory system: Secondary | ICD-10-CM | POA: Insufficient documentation

## 2011-04-12 DIAGNOSIS — R0602 Shortness of breath: Secondary | ICD-10-CM

## 2011-04-12 DIAGNOSIS — I4949 Other premature depolarization: Secondary | ICD-10-CM

## 2011-04-12 DIAGNOSIS — Z8673 Personal history of transient ischemic attack (TIA), and cerebral infarction without residual deficits: Secondary | ICD-10-CM | POA: Insufficient documentation

## 2011-04-12 DIAGNOSIS — R42 Dizziness and giddiness: Secondary | ICD-10-CM | POA: Insufficient documentation

## 2011-04-12 DIAGNOSIS — R0789 Other chest pain: Secondary | ICD-10-CM

## 2011-04-12 DIAGNOSIS — I10 Essential (primary) hypertension: Secondary | ICD-10-CM | POA: Insufficient documentation

## 2011-04-12 MED ORDER — TECHNETIUM TC 99M TETROFOSMIN IV KIT
33.0000 | PACK | Freq: Once | INTRAVENOUS | Status: AC | PRN
  Administered 2011-04-12: 33 via INTRAVENOUS

## 2011-04-12 MED ORDER — REGADENOSON 0.4 MG/5ML IV SOLN
0.4000 mg | Freq: Once | INTRAVENOUS | Status: AC
  Administered 2011-04-12: 0.4 mg via INTRAVENOUS

## 2011-04-12 MED ORDER — TECHNETIUM TC 99M TETROFOSMIN IV KIT
11.0000 | PACK | Freq: Once | INTRAVENOUS | Status: AC | PRN
  Administered 2011-04-12: 11 via INTRAVENOUS

## 2011-04-12 NOTE — Progress Notes (Addendum)
[facility] SITE 3 NUCLEAR MED [address] [place] [address] [phone]  Cardiology Nuclear Med Study  [facility] is a 77 y.o. male [id number] 06-18-1927   Nuclear Med Background Indication for Stress Test:  Evaluation for Ischemia History: 10/12 Echo:EF=55-60% with mild LVH & mild MR,10 Heart Catheterization:No CAD with severe mitral regurgitation, 08 Myocardial Perfusion Study:Normal with EF= 66% and 10/12 Holter monitor showed short runs of NSVT Cardiac Risk Factors: Family History - CAD, Hypertension, Lipids and  TIA  Symptoms:  Chest Tightness (last date 2 weeks ago), DOE, Fatigue, Light-Headedness: Intermittent Vertigo and SOB   Nuclear Pre-Procedure Caffeine/Decaff Intake:  None NPO After: 8:00pm   Lungs:  clear IV 0.9% NS with Angio Cath:  20g  IV Site: R Wrist x 1, tolerated well IV Started by:  Irean Hong, RN  Chest Size (in):  42 Cup Size: n/a  Height: 5\' 8"  (1.727 m)  Weight:  188 lb (85.276 kg)  BMI:  Body mass index is 28.59 kg/(m^2). Tech Comments:  Held Metoprolol x 24 hrs    Nuclear Med Study 1 or 2 day study: 1 day  Stress Test Type:  Treadmill/Lexiscan  Reading MD: Arvilla Meres, MD  Order Authorizing Provider:  Marca Ancona, MD  Resting Radionuclide: Technetium 30m Tetrofosmin  Resting Radionuclide Dose: 11.0 mCi   Stress Radionuclide:  Technetium 17m Tetrofosmin  Stress Radionuclide Dose: 33.0 mCi           Stress Protocol Rest HR: 47 Stress HR: 93  Rest BP: 125/88 Stress BP: 156/95  Exercise Time (min): 6:17 METS: 5.0   Predicted Max HR: 139 bpm % Max HR: 61.87 bpm Rate Pressure Product: 46962   Dose of Adenosine (mg):  n/a Dose of Lexiscan: 0.4 mg  Dose of Atropine (mg): n/a Dose of Dobutamine: n/a mcg/kg/min (at max HR)  Stress Test Technologist: Cathlyn Parsons, RN  Nuclear Technologist:  Domenic Polite, CNMT     Rest Procedure:  Myocardial perfusion imaging was performed at rest 45 minutes  following the intravenous administration of Technetium 44m Tetrofosmin. Rest ECG: NSR-RBBB with 1st degree  Heart Block  Stress Procedure:  The patient exercised for 4:00 mins and was unable to reach target Heart rate. The patient stopped due to fatigue and dyspnea. Patient changed to Lexiscan. The patient received IV Lexiscan 0.4 mg over 15 seconds and  Myoview was injected at 30 seconds while the patient continued walking at low level exercise. Patient denied any chest pain. Patient was dizzy and SOB with infusion.  There were no significant ST-T wave changes.  Quantitative spect images were obtained after a 45 minute delay. Stress ECG: No significant change from baseline ECG  QPS Raw Data Images:  Normal; no motion artifact; normal heart/lung ratio. Stress Images:  Normal homogeneous uptake in all areas of the myocardium. Rest Images:  Normal homogeneous uptake in all areas of the myocardium. Subtraction (SDS):  Normal Transient Ischemic Dilatation (Normal <1.22):  1.02 Lung/Heart Ratio (Normal <0.45):  0.36  Quantitative Gated Spect Images QGS EDV:135 ml QGS ESV:  62 ml QGS cine images:  NL LV Function; NL Wall Motion QGS EF: 54%  Impression Exercise Capacity:  Lexiscan with low level exercise. BP Response:  Normal blood pressure response. Clinical Symptoms:  There is dyspnea. ECG Impression:  No significant ST segment change suggestive of ischemia. +PVCs Comparison with Prior Nuclear Study: No significant change from previous study  Overall Impression:  Normal stress nuclear study.   Reuel Boom  Mervyn Pflaum    EF 54%, no ischemia or infarction noted. Please inform patient.   Dalton [employer]

## 2011-04-20 ENCOUNTER — Encounter: Payer: Self-pay | Admitting: Cardiology

## 2011-04-20 ENCOUNTER — Ambulatory Visit (INDEPENDENT_AMBULATORY_CARE_PROVIDER_SITE_OTHER): Payer: Medicare Other | Admitting: Cardiology

## 2011-04-20 DIAGNOSIS — I493 Ventricular premature depolarization: Secondary | ICD-10-CM

## 2011-04-20 DIAGNOSIS — I4949 Other premature depolarization: Secondary | ICD-10-CM

## 2011-04-20 DIAGNOSIS — G459 Transient cerebral ischemic attack, unspecified: Secondary | ICD-10-CM

## 2011-04-20 DIAGNOSIS — I059 Rheumatic mitral valve disease, unspecified: Secondary | ICD-10-CM

## 2011-04-20 DIAGNOSIS — R0609 Other forms of dyspnea: Secondary | ICD-10-CM

## 2011-04-20 MED ORDER — FUROSEMIDE 20 MG PO TABS: 20.0000 mg | ORAL_TABLET | Freq: Every day | ORAL | Status: DC

## 2011-04-20 MED ORDER — ATORVASTATIN CALCIUM 10 MG PO TABS: 10.0000 mg | ORAL_TABLET | Freq: Every day | ORAL | Status: DC

## 2011-04-20 MED ORDER — POTASSIUM CHLORIDE CRYS ER 20 MEQ PO TBCR: 20.0000 meq | EXTENDED_RELEASE_TABLET | Freq: Every day | ORAL | Status: DC

## 2011-04-20 NOTE — Assessment & Plan Note (Signed)
Patient had 19% PVCs with short runs of NSVT on holter.  EF was normal by echo and ETT-myoview showed no evidence for ischemia or infarction.  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  He is now on Toprol XL 25 mg bid to try to suppress the PVCs.  - Continue Toprol XL. - Repeat holter (24 hours) to see if PVC burden is less on beta blocker.  - Cardiac MRI to assess for infiltrative disease or scar that could be a nidus for PVCs.

## 2011-04-20 NOTE — Patient Instructions (Signed)
Start lasix(furosemide) 20mg  daily.  Start KCl(potassium) 20 mEq daily.  Start atorvastatin (lipitor) 10mg  daily.  Your physician recommends that you return for lab work in: 2 weeks--BMP/BNP 427.69  424.0  Your physician has recommended that you wear a holter monitor. Holter monitors are medical devices that record the heart's electrical activity. Doctors most often use these monitors to diagnose arrhythmias. Arrhythmias are problems with the speed or rhythm of the heartbeat. The monitor is a small, portable device. You can wear one while you do your normal daily activities. This is usually used to diagnose what is causing palpitations/syncope (passing out). 24 hour holter monitor . Dr Shirlee Latch wants to recheck this now you are taking metoprolol.   Your physician has requested that you have a cardiac MRI. Cardiac MRI uses a computer to create images of your heart as its beating, producing both still and moving pictures of your heart and major blood vessels. For further information please visit [email or web address]. Please follow the instruction sheet given to you today for more information.  Your physician recommends that you schedule a follow-up appointment in: 3 months with Dr Shirlee Latch.   Your physician recommends that you return for a FASTING lipid profile/liver profile in 3 months when you see Dr Shirlee Latch.

## 2011-04-20 NOTE — Assessment & Plan Note (Signed)
Given history of TIA, I would like to see [facility] LDL lower. I am going to start him on atorvastatin 10 mg daily with lipids/LFTs in 2 months.

## 2011-04-20 NOTE — Assessment & Plan Note (Signed)
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  Per patient, this has been slowly progressive.  His PFTs were restrictive and seemed to be somewhat out of proportion to the diaphragmatic paralysis.  Echo showed normal EF and mitral valve repair appeared intact.  ETT-myoview showed no ischemia.  Based on the overall picture, his dyspnea would seem to be most likely to be due to lung mechanics.  Frequent PVCs could certainly contribute by lowering cardiac output even without any affect so far on systolic function.   - Patient does not appear volume overloaded on exam but his BNP is mildly elevated.  I will try him on Lasix 20 mg daily with KCl 20 mEq daily to try to improve any component of the dyspnea that is due to diastolic CHF.  BMET/BNP in 2 wks.  - Mr Wimmer is interested in at least speaking with a surgeon at [facility] named Lesli Albee who has experience with diaphragmatic issues.  I think that this would be reasonable.  He will contact Dr. Conception Chancy office at [facility] for a referral.   Followup in 3 months.
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
## 2011-04-21 ENCOUNTER — Encounter: Payer: Self-pay | Admitting: Internal Medicine

## 2011-04-22 ENCOUNTER — Telehealth: Payer: Self-pay

## 2011-04-26 ENCOUNTER — Encounter: Payer: Medicare Other | Admitting: Internal Medicine

## 2011-04-27 ENCOUNTER — Other Ambulatory Visit: Payer: Self-pay | Admitting: *Deleted

## 2011-05-02 ENCOUNTER — Telehealth: Payer: Self-pay | Admitting: *Deleted

## 2011-05-02 NOTE — Telephone Encounter (Signed)
Patient has appt for monitor on 05/03/2011 @9 :15am

## 2011-05-02 NOTE — Telephone Encounter (Signed)
05/02/11 Spoke with Mr.Gwinner wife and was given another phone number to call,foward this information to [place].

## 2011-05-03 ENCOUNTER — Other Ambulatory Visit (INDEPENDENT_AMBULATORY_CARE_PROVIDER_SITE_OTHER): Payer: Medicare Other | Admitting: *Deleted

## 2011-05-03 ENCOUNTER — Ambulatory Visit: Payer: Medicare Other | Admitting: Internal Medicine

## 2011-05-03 ENCOUNTER — Encounter (INDEPENDENT_AMBULATORY_CARE_PROVIDER_SITE_OTHER): Payer: Medicare Other

## 2011-05-03 DIAGNOSIS — I059 Rheumatic mitral valve disease, unspecified: Secondary | ICD-10-CM

## 2011-05-03 DIAGNOSIS — R0989 Other specified symptoms and signs involving the circulatory and respiratory systems: Secondary | ICD-10-CM

## 2011-05-03 DIAGNOSIS — R002 Palpitations: Secondary | ICD-10-CM

## 2011-05-03 DIAGNOSIS — I493 Ventricular premature depolarization: Secondary | ICD-10-CM

## 2011-05-03 DIAGNOSIS — I4949 Other premature depolarization: Secondary | ICD-10-CM

## 2011-05-03 LAB — BASIC METABOLIC PANEL
BUN: 17 mg/dL (ref 6–23)
Calcium: 9.3 mg/dL (ref 8.4–10.5)
Creatinine, Ser: 1.1 mg/dL (ref 0.4–1.5)
GFR: 71.23 mL/min (ref >60.00)
Glucose, Bld: 83 mg/dL (ref 70–99)
Sodium: 140 mEq/L (ref 135–145)

## 2011-05-04 ENCOUNTER — Other Ambulatory Visit: Payer: Medicare Other | Admitting: *Deleted

## 2011-05-06 ENCOUNTER — Telehealth: Payer: Self-pay | Admitting: *Deleted

## 2011-05-06 NOTE — Telephone Encounter (Signed)
Dr Shirlee Latch reviewed monitor done 05/03/11. PVCs down to 5.6% total beats--improved. Short runs SVT. Would increase Toprol XL to 50mg  am and 25mg  pm.

## 2011-05-09 NOTE — Telephone Encounter (Signed)
LMTCB

## 2011-05-10 MED ORDER — METOPROLOL SUCCINATE ER 25 MG PO TB24: ORAL_TABLET | ORAL | Status: DC

## 2011-05-10 NOTE — Telephone Encounter (Signed)
I talked with pt. Pt agreed to increase Toprol XL to 50mg (two 25mg  tablets) am and 25mg  pm.

## 2011-05-13 ENCOUNTER — Encounter: Payer: Self-pay | Admitting: Internal Medicine

## 2011-05-13 ENCOUNTER — Ambulatory Visit (INDEPENDENT_AMBULATORY_CARE_PROVIDER_SITE_OTHER): Payer: Medicare Other | Admitting: Internal Medicine

## 2011-05-13 ENCOUNTER — Telehealth: Payer: Self-pay | Admitting: Internal Medicine

## 2011-05-13 VITALS — BP 122/78 | HR 52 | Temp 97.6°F | Ht 69.0 in | Wt 196.0 lb

## 2011-05-13 DIAGNOSIS — R0989 Other specified symptoms and signs involving the circulatory and respiratory systems: Secondary | ICD-10-CM

## 2011-05-13 DIAGNOSIS — R0602 Shortness of breath: Secondary | ICD-10-CM

## 2011-05-13 DIAGNOSIS — R0609 Other forms of dyspnea: Secondary | ICD-10-CM

## 2011-05-13 NOTE — Progress Notes (Signed)
Subjective:    Patient ID: [facility], male    DOB: 1927-10-11, 77 y.o.   MRN: [id number]  HPI 1. Dyspnea  - class 3-4 following Mitral valve surgery June 2010 and resultant right diaphragm paralysis. No improvement with rehab.  2. Restricted PFTs out of pproportion to above. Seen by Dr. Katrine Coho at [facility] 2011 - suspects associated loss of height and ? etoh neuropathy (drinks 1-3 glasses wine nightly) playing a role.   OV 02/14/11: Followup for above. Last seen 08/03/10. Since then he feels dyspnea is worse. STates brough on by exertion and relieved by rest. Feels dyspneic at rest too. He feels he is dyspneic sitting in office and talking to me. Feels only 50% of lung function. This morning he told his PMD, dyspnea is stable and even at last visit he stated to me that he feels only at 50% but nevertheless he tells me he has declined in past year. Hitting golf ball 30 yards less than last summer. He is wondering if he is hypoxemic.  Due to see Dr. Katrine Coho in few weeks. Of note, when we exerted him in office - 185 x 3 laps; he did not desaturate but HR rose to 160/min. Spirometry Fev1 1.3L/41%, FVC 2.4L/57%,  Ratio 54% - this is similar to before though I cannot remember if there was obstructive component    OV 04/01/11: Followup dyspnea as above. Supppsed to have seen Dr Shirlee Latch for exertional tachycardia; got cleared by Dr Patty Sermons to switch to Dr Shirlee Latch his wife's cardiologist but he is yet to see Dr Shirlee Latch for unclear reasons. He did followup with  Dr Katrine Coho of pulmonary at [facility] and has been told that diaphragm paralysis is probably irreversible and conservative measures advised (no note yet from [facility]). He continues to play golf twice a week and reports good spirits but is bothered by the dyspnea. Still c/o persistent class 3-4 dyspnea. Very frustrating. Also has ongoing vertigo.  He again demonstrates heart rate variability. Got very tachycardic with walking in office and bradycardic with  orthostatics.  Prior to th September visit I do not recolllect this tachycardia. Never informed of it by the rehab nurses in past. He has had CPST 09/07/09 and his peak HR was only 115/min at that time.  He denies any neuromuscular symptoms  Specificially, at rest pulse ox 93%/HR 72 and we walked him 185 feet. Pulse ox stayed at 92 but HR rose to 150/min. Checked orthostats SUPINE: BP 122/82, HR 66, pulse ox 94%, -> STANDING BP 128/88, HR DRPOPPED TO 36 and he got DIZZY, and pulse ox 98%  REC Shortness of breath  - on exam and breathing test last visit - it appears that the right diaphragm is still paralyzed and there is an element of obstruction but inhalers have not helped in past for you. So, I will hold off on inhaler for now till you see cardiology for heart rate  - when we walked you again today Your heart rate rose to 150/min on our monitor  - Also, when we stood you up your heart rate dropped to 36 and you got dizzy  - So please see Dr Shirlee Latch (your wife's cardiologist) or Dr Graciela Husbands cardiology for evaluation - ASAP; Dr Patty Sermons ok with switch to Dr Shirlee Latch.  - I have spoken to Dr Graciela Husbands a heart electricity / rhytym doctor and the [facility] cardiology office should call you hopefully later today  - If you have not heard from cardiology by Monday,  please call our office  - return to see me in less than 1 month; after seeing Dr Shirlee Latch   OV 05/13/11: [place] dyspnea. In interim has seen cardiology Dr Shirlee Latch whose findings are: holter monitor showing short runs of NSVT and 19% PVCs. Echo showed preserved LV systolic function and a repaired mitral valve with probably mild MR and minimal MS. There was borderline pulmonary HTN and a normal RV. I had him do an ETT-myoview. Exercise tolerance was below average (4 minutes) but there was no evidence for ischemia or infarction. He has him on Toprol XL 25 mg bid to try to suppress PVCs and lasix. Despite this Mr Lofton tells me that dyspnea is no better. Today  walking him 185 feet x 3 laps; HR stayed < 90 but did drop pulse ox to 87% (? Aritifact) but if true this is first occurrence for desaturation. No other new symptoms.      Review of Systems  Constitutional: Negative for fever and unexpected weight change.  HENT: Negative for ear pain, nosebleeds, congestion, sore throat, rhinorrhea, sneezing, trouble swallowing, dental problem, postnasal drip and sinus pressure.   Eyes: Negative for redness and itching.  Respiratory: Positive for shortness of breath. Negative for cough, chest tightness and wheezing.   Cardiovascular: Negative for palpitations and leg swelling.  Gastrointestinal: Negative for nausea and vomiting.  Genitourinary: Negative for dysuria.  Musculoskeletal: Negative for joint swelling.  Skin: Negative for rash.  Neurological: Positive for light-headedness. Negative for headaches.  Hematological: Does not bruise/bleed easily.  Psychiatric/Behavioral: Negative for dysphoric mood. The patient is not nervous/anxious.        Objective:   Physical Exam Nursing note and vitals reviewed. Constitutional: He is oriented to person, place, and time. He appears well-developed and well-nourished. No distress.  HENT:  Head: Normocephalic and atraumatic.  Right Ear: External ear normal.  Left Ear: External ear normal.  Mouth/Throat: Oropharynx is clear and moist. No oropharyngeal exudate.  Eyes: Conjunctivae and EOM are normal. Pupils are equal, round, and reactive to light. Right eye exhibits no discharge. Left eye exhibits no discharge. No scleral icterus.  Neck: Normal range of motion. Neck supple. No JVD present. No tracheal deviation present. No thyromegaly present.  Cardiovascular: Normal rate, regular rhythm and intact distal pulses.  Exam reveals no gallop and no friction rub.   No murmur heard. Pulmonary/Chest: Effort normal. No respiratory distress. He has no wheezes. He has no rales. He exhibits no tenderness. HE IS PUFFING and  APPEARS TO BE MILDLY PANTING EVEN WHEN SITTING AND TALKING TO ME      Diminished air entry rt base  Abdominal: Soft. Bowel sounds are normal. He exhibits no distension and no mass. There is no tenderness. There is no rebound and no guarding.  Musculoskeletal: Normal range of motion. He exhibits no edema and no tenderness.       Walks with hunch  Lymphadenopathy:    He has no cervical adenopathy.  Neurological: He is alert and oriented to person, place, and time. He has normal reflexes. No cranial nerve deficit. Coordination normal.       Mild LUE tremor  Skin: Skin is warm and dry. No rash noted. He is not diaphoretic. No erythema. No pallor.  Psychiatric: He has a normal mood and affect. His behavior is normal. Judgment and thought content normal.          Assessment & Plan:

## 2011-05-13 NOTE — Patient Instructions (Signed)
I have emailed Dr Katrine Coho about you seeing ? Dr Lesli Albee the surgeon at [facility] about diaphragm plication - you should hear from him next week WE will repeat cardiopulmnary exercise stress testing with ABG test at rest and peak exercise - will set it up; I will ensure Dr Shirlee Latch is okay with this

## 2011-05-13 NOTE — Telephone Encounter (Signed)
Left message for pt to call back  °

## 2011-05-16 ENCOUNTER — Other Ambulatory Visit: Payer: Self-pay | Admitting: *Deleted

## 2011-05-16 ENCOUNTER — Encounter: Payer: Self-pay | Admitting: Internal Medicine

## 2011-05-16 ENCOUNTER — Telehealth: Payer: Self-pay | Admitting: Internal Medicine

## 2011-05-16 DIAGNOSIS — I493 Ventricular premature depolarization: Secondary | ICD-10-CM

## 2011-05-16 DIAGNOSIS — R0609 Other forms of dyspnea: Secondary | ICD-10-CM

## 2011-05-16 NOTE — Assessment & Plan Note (Signed)
No better despite what appears to be correction of tachycardia.  ? New desaturation is true. Will repeat CPST; he is ok for that. I have touched base with Dr Katrine Coho at [facility] Who will touch base with surgeon about dipahragm plication; MR Wanninger appears very keen to have that conversation with surgeon  > 50% of this > 15 min visit spent in face to face counseling

## 2011-05-16 NOTE — Telephone Encounter (Signed)
That would be ok.

## 2011-05-16 NOTE — Telephone Encounter (Signed)
   DAlton, he dropped pulse ox when I walked him 12/7 - ? Artifact but no tachycardia. But dyspnea no better. I want to put him to CPST again if that is ok with you  Please let me know  Thanks MR

## 2011-05-16 NOTE — Telephone Encounter (Signed)
Thanks Camera operator.  Jennifer: please ensure CPEX order with abg pre and peak exercise and EIB challenge

## 2011-05-17 NOTE — Telephone Encounter (Signed)
Pt states that his health is good, no problems. He is not on blood thinner or DM meds. Reviewed all of meds with pt. Should pt be scheduled for a direct procedure..........Marland KitchenPlease advise.

## 2011-05-17 NOTE — Telephone Encounter (Signed)
Pt scheduled for previsit 05/20/11@9am , EGD with dil with propofol scheduled for 05/24/11@10am . Pt aware of appt dates and times.

## 2011-05-17 NOTE — Telephone Encounter (Signed)
Call and see how his overall health is doing. Also, check his meds (make sur no blood thinners, DM meds etc). Let me know.... We might be able to schedule him directly

## 2011-05-17 NOTE — Telephone Encounter (Signed)
He needs propofol. Schedule EGD / Dilation at [facility] on TUE 05-24-11 at 10 am (currently open)

## 2011-05-17 NOTE — Telephone Encounter (Signed)
Pt states that he is starting to have that "choking feeling" again. States he had some difficulty last week getting some cheese to go down. Pt thinks he may need to have an egd with dil. Last procedure was done 11/14/2008. Dr. Marina Goodell please advise.

## 2011-05-18 ENCOUNTER — Encounter: Payer: Self-pay | Admitting: Cardiology

## 2011-05-18 NOTE — Telephone Encounter (Signed)
CPST and ABG  order placed and pt set to have it done 05-19-11. Pt aware of time and date. Also I spoke with Elon Jester at Cardiopulmonary and she states to place the request for the ABG in the CPST order and they will take it from there. Order has been sent. [name], CMA

## 2011-05-19 ENCOUNTER — Ambulatory Visit (HOSPITAL_COMMUNITY): Payer: Medicare Other | Attending: Internal Medicine

## 2011-05-19 ENCOUNTER — Telehealth: Payer: Self-pay | Admitting: Internal Medicine

## 2011-05-19 ENCOUNTER — Other Ambulatory Visit (HOSPITAL_COMMUNITY): Payer: Self-pay | Admitting: Respiratory Therapy

## 2011-05-19 DIAGNOSIS — R0609 Other forms of dyspnea: Secondary | ICD-10-CM | POA: Insufficient documentation

## 2011-05-19 DIAGNOSIS — R0989 Other specified symptoms and signs involving the circulatory and respiratory systems: Secondary | ICD-10-CM | POA: Insufficient documentation

## 2011-05-19 LAB — BLOOD GAS, ARTERIAL
Acid-base deficit: 0.2 mmol/L (ref 0.0–2.0)
Acid-base deficit: 2.2 mmol/L — ABNORMAL HIGH (ref 0.0–2.0)
Bicarbonate: 22.1 mEq/L (ref 20.0–24.0)
O2 Saturation: 95.6 %
O2 Saturation: 97.4 %
Patient temperature: 98.6
Patient temperature: 98.6
TCO2: 24.9 mmol/L (ref 0–100)
TCO2: 23.2 mmol/L (ref 0–100)
pH, Arterial: 7.415 (ref 7.350–7.450)

## 2011-05-19 NOTE — Telephone Encounter (Signed)
Left message for pt to call back  °

## 2011-05-20 ENCOUNTER — Ambulatory Visit (AMBULATORY_SURGERY_CENTER): Payer: Medicare Other | Admitting: *Deleted

## 2011-05-20 ENCOUNTER — Other Ambulatory Visit: Payer: Self-pay | Admitting: Internal Medicine

## 2011-05-20 ENCOUNTER — Encounter: Payer: Self-pay | Admitting: Internal Medicine

## 2011-05-20 DIAGNOSIS — R1310 Dysphagia, unspecified: Secondary | ICD-10-CM

## 2011-05-20 MED ORDER — SILDENAFIL CITRATE 100 MG PO TABS: 100.0000 mg | ORAL_TABLET | Freq: Every day | ORAL | Status: DC | PRN

## 2011-05-20 NOTE — Telephone Encounter (Signed)
Dr.Hopper please advise, this is not on current med list. Last OV 02/2011

## 2011-05-20 NOTE — Telephone Encounter (Signed)
Per Dr.Hopper ok to fill  

## 2011-05-20 NOTE — Progress Notes (Signed)
[facility] [place] asked me when his next colon was due and I told him 5 years from his last one.  In reviewing his endo reports I found that Dr. Marina Goodell had cancelled his recall actually due last year 2011 due to age and medical issues.  I left pt. A message on his home phone and also mailed him a copy of the cancellation note on the recall form.

## 2011-05-20 NOTE — Progress Notes (Deleted)
Error

## 2011-05-21 NOTE — Telephone Encounter (Signed)
Noted on Rx: "can not be taken if on NTG"

## 2011-05-23 ENCOUNTER — Ambulatory Visit (HOSPITAL_COMMUNITY)
Admission: RE | Admit: 2011-05-23 | Discharge: 2011-05-23 | Disposition: A | Discharge status: Home / Self Care | Payer: Medicare Other | Source: Ambulatory Visit | Attending: Cardiology | Admitting: Cardiology

## 2011-05-23 ENCOUNTER — Other Ambulatory Visit: Payer: Self-pay | Admitting: Internal Medicine

## 2011-05-23 DIAGNOSIS — I4949 Other premature depolarization: Secondary | ICD-10-CM

## 2011-05-23 DIAGNOSIS — I493 Ventricular premature depolarization: Secondary | ICD-10-CM

## 2011-05-23 DIAGNOSIS — I517 Cardiomegaly: Secondary | ICD-10-CM | POA: Insufficient documentation

## 2011-05-23 MED ORDER — GADOBENATE DIMEGLUMINE 529 MG/ML IV SOLN
30.0000 mL | Freq: Once | INTRAVENOUS | Status: AC
  Administered 2011-05-23: 30 mL via INTRAVENOUS

## 2011-05-24 ENCOUNTER — Encounter: Payer: Medicare Other | Admitting: Internal Medicine

## 2011-05-24 NOTE — Telephone Encounter (Signed)
DUPLICATE, Filled on 05/20/11

## 2011-05-24 NOTE — Telephone Encounter (Signed)
Spoke with pt and he has already rescheduled his EGD.

## 2011-05-27 ENCOUNTER — Telehealth: Payer: Self-pay | Admitting: Internal Medicine

## 2011-05-27 NOTE — Telephone Encounter (Signed)
Pt needed to speak w/ Med Rec & request documents for test results for Dr. Gery Pray prior to surgery Jan 9.  [place] [place]

## 2011-06-04 DIAGNOSIS — R0602 Shortness of breath: Secondary | ICD-10-CM

## 2011-06-09 ENCOUNTER — Encounter: Payer: Medicare Other | Admitting: Internal Medicine

## 2011-06-16 ENCOUNTER — Encounter: Payer: Medicare Other | Admitting: Internal Medicine

## 2011-06-16 ENCOUNTER — Telehealth: Payer: Self-pay | Admitting: *Deleted

## 2011-06-16 NOTE — Telephone Encounter (Signed)
Patient called and stated that he ate a whole sandwich and bowl of soup around 12:30 pm today and his procedure was scheduled for 3:30 pm with Propo[facility].  We discussed this with Dr. Marina Goodell and he wants to have patient reschedule Endoscopy.  This was rescheduled for January 15th at 2:30 pm and advised patient to arrive at 1:30 pm.  I also advised him not to eat any solids after midnight on January 14th and he advised me that he would not make that mistake again.

## 2011-06-17 ENCOUNTER — Other Ambulatory Visit: Payer: Self-pay | Admitting: Dermatology

## 2011-06-21 ENCOUNTER — Ambulatory Visit (AMBULATORY_SURGERY_CENTER): Payer: Medicare Other | Admitting: Internal Medicine

## 2011-06-21 ENCOUNTER — Encounter: Payer: Self-pay | Admitting: Internal Medicine

## 2011-06-21 VITALS — BP 161/124 | HR 69 | Temp 97.4°F | Resp 16 | Ht 72.0 in | Wt 190.0 lb

## 2011-06-21 DIAGNOSIS — K222 Esophageal obstruction: Secondary | ICD-10-CM

## 2011-06-21 DIAGNOSIS — K219 Gastro-esophageal reflux disease without esophagitis: Secondary | ICD-10-CM

## 2011-06-21 DIAGNOSIS — R1310 Dysphagia, unspecified: Secondary | ICD-10-CM

## 2011-06-21 MED ORDER — SODIUM CHLORIDE 0.9 % IV SOLN: 500.0000 mL | INTRAVENOUS | Status: DC

## 2011-06-21 NOTE — Progress Notes (Signed)
Patient did not have preoperative order for IV antibiotic SSI prophylaxis. (G8918)  Patient did not experience any of the following events: a burn prior to discharge; a fall within the facility; wrong site/side/patient/procedure/implant event; or a hospital transfer or hospital admission upon discharge from the facility. (G8907)  

## 2011-06-21 NOTE — Patient Instructions (Signed)
See handouts.  If you have any questions please call [phone].  Thank-you.

## 2011-06-21 NOTE — Op Note (Signed)
[facility] [address] [place], [place]  [address]  ENDOSCOPY PROCEDURE REPORT  PATIENT:  Adrian West, Adrian West  MR#:  [id number] BIRTHDATE:  [birth year], 83 yrs. old  GENDER:  male  ENDOSCOPIST:  Wilhemina Bonito. Eda Keys, MD Referred by:  .Direct / Self  PROCEDURE DATE:  06/21/2011 PROCEDURE:  EGD with balloon dilatation - 37mm-19mm ASA CLASS:  Class III INDICATIONS:  dilation of esophageal stricture, dysphagia  MEDICATIONS:   MAC sedation, administered by CRNA, propofol (Diprivan) 200 mg IV TOPICAL ANESTHETIC:  none  DESCRIPTION OF PROCEDURE:   After the risks benefits and alternatives of the procedure were thoroughly explained, informed consent was obtained.  The LB GIF-H180 [id number] endoscope was introduced through the mouth and advanced to the second portion of the duodenum, without limitations.  The instrument was slowly withdrawn as the mucosa was fully examined. <<PROCEDUREIMAGES>>  A 16mm ring-like stricture was found in the distal esophagus as well as mild esophagitis (edema).  Otherwise the examination of the esophagus, stomach, and duodenum was normal.    Retroflexed views revealed a small hiatal hernia.  THERAPY: TTS BALLOON USED TO DILATE STRICTURE UNDER DIRECT VISION. NO RESISTANCE OR HEME WITH . MILD RESISTANCE AND HEME WITH . TOLERATED WELL    The scope was then withdrawn from the patient and the procedure completed.  COMPLICATIONS:  None  ENDOSCOPIC IMPRESSION: 1) Stricture in the distal esophagus - S/P DILATION TO 2) Mild Esophagitis in the distal esophagus 3) Otherwise normal examination 4) A smallhiatal hernia 5) GERD  RECOMMENDATIONS: 1) Clear liquids until 5 pm, then soft foods rest of day. Resume prior diet tomorrow. 2) OMEPRAZOLE 20MG  DAILY FOR ACID REFLUX. YOU CAN PICK UP OTC OR WE CAN CALL IN A PRESCRIPTION.  ______________________________ Wilhemina Bonito. Eda Keys, MD  CC:  Pecola Lawless, MD;  The Patient  n. eSIGNED:   Wilhemina Bonito.  Eda Keys at 06/21/2011 02:49 PM  Manders, Sharlet [place], [phone]

## 2011-06-22 ENCOUNTER — Encounter: Payer: Self-pay | Admitting: Internal Medicine

## 2011-06-22 ENCOUNTER — Telehealth: Payer: Self-pay | Admitting: *Deleted

## 2011-06-22 ENCOUNTER — Ambulatory Visit (INDEPENDENT_AMBULATORY_CARE_PROVIDER_SITE_OTHER): Payer: Medicare Other | Admitting: Internal Medicine

## 2011-06-22 VITALS — BP 108/80 | HR 55 | Temp 96.7°F | Ht 70.0 in | Wt 198.0 lb

## 2011-06-22 DIAGNOSIS — R0609 Other forms of dyspnea: Secondary | ICD-10-CM

## 2011-06-22 DIAGNOSIS — R0989 Other specified symptoms and signs involving the circulatory and respiratory systems: Secondary | ICD-10-CM

## 2011-06-22 NOTE — Progress Notes (Signed)
Subjective:    Patient ID: Adrian West, male    DOB: 05/03/1928, 78 y.o.   MRN: [id number]  HPI  . Dyspnea  - class 3-4 following Mitral valve surgery June 2010 and resultant right diaphragm paralysis. No improvement with rehab.  2. Restricted PFTs out of pproportion to above. Seen by Dr. Katrine Coho at [facility] 2011 - suspects associated loss of height and ? etoh neuropathy (drinks 1-3 glasses wine nightly) playing a role.   OV 02/14/11: Followup for above. Last seen 08/03/10. Since then he feels dyspnea is worse. STates brough on by exertion and relieved by rest. Feels dyspneic at rest too. He feels he is dyspneic sitting in office and talking to me. Feels only 50% of lung function. This morning he told his PMD, dyspnea is stable and even at last visit he stated to me that he feels only at 50% but nevertheless he tells me he has declined in past year. Hitting golf ball 30 yards less than last summer. He is wondering if he is hypoxemic.  Due to see Dr. Katrine Coho in few weeks. Of note, when we exerted him in office - 185 x 3 laps; he did not desaturate but HR rose to 160/min. Spirometry Fev1 1.3L/41%, FVC 2.4L/57%,  Ratio 54% - this is similar to before though I cannot remember if there was obstructive component    OV 04/01/11: Followup dyspnea as above. Supppsed to have seen Dr Shirlee Latch for exertional tachycardia; got cleared by Dr Patty Sermons to switch to Dr Shirlee Latch his wife's cardiologist but he is yet to see Dr Shirlee Latch for unclear reasons. He did followup with  Dr Katrine Coho of pulmonary at [facility] and has been told that diaphragm paralysis is probably irreversible and conservative measures advised (no note yet from [facility]). He continues to play golf twice a week and reports good spirits but is bothered by the dyspnea. Still c/o persistent class 3-4 dyspnea. Very frustrating. Also has ongoing vertigo.  He again demonstrates heart rate variability. Got very tachycardic with walking in office and bradycardic with  orthostatics.  Prior to th September visit I do not recolllect this tachycardia. Never informed of it by the rehab nurses in past. He has had CPST 09/07/09 and his peak HR was only 115/min at that time.  He denies any neuromuscular symptoms  Specificially, at rest pulse ox 93%/HR 72 and we walked him 185 feet. Pulse ox stayed at 92 but HR rose to 150/min. Checked orthostats SUPINE: BP 122/82, HR 66, pulse ox 94%, -> STANDING BP 128/88, HR DRPOPPED TO 36 and he got DIZZY, and pulse ox 98%  REC Shortness of breath  - on exam and breathing test last visit - it appears that the right diaphragm is still paralyzed and there is an element of obstruction but inhalers have not helped in past for you. So, I will hold off on inhaler for now till you see cardiology for heart rate  - when we walked you again today Your heart rate rose to 150/min on our monitor  - Also, when we stood you up your heart rate dropped to 36 and you got dizzy  - So please see Dr Shirlee Latch (your wife's cardiologist) or Dr Graciela Husbands cardiology for evaluation - ASAP; Dr Patty Sermons ok with switch to Dr Shirlee Latch.  - I have spoken to Dr Graciela Husbands a heart electricity / rhytym doctor and the [facility] cardiology office should call you hopefully later today  - If you have not heard from cardiology by  Monday, please call our office  - return to see me in less than 1 month; after seeing Dr Shirlee Latch   OV 05/13/11: [place] dyspnea. In interim has seen cardiology Dr Shirlee Latch whose findings are: holter monitor showing short runs of NSVT and 19% PVCs. Echo showed preserved LV systolic function and a repaired mitral valve with probably mild Adrian and minimal MS. There was borderline pulmonary HTN and a normal RV. I had him do an ETT-myoview. Exercise tolerance was below average (4 minutes) but there was no evidence for ischemia or infarction. He has him on Toprol XL 25 mg bid to try to suppress PVCs and lasix. Despite this Adrian West tells me that dyspnea is no better. Today  walking him 185 feet x 3 laps; HR stayed < 90 but did drop pulse ox to 87% (? Aritifact) but if true this is first occurrence for desaturation. No other new symptoms.   OV 06/22/2011 Had espophageal dilatation yestrerday -  Feels some relief wit hdyspnea. Has seen Dr Allyson Sabal CVTS at [facility] 06/10/2011  And advised against thoracotomy diaphragm plication (note reviewed). FEels tachycardia control has not helped dyspnea. Overall dyspnea is same but stable. Swimming 3 times a week. Playing golf. No decrease in golf strength in 5 years per hx; still hitting 200 yards. Thyroid function stable as of April 2012. Feels tired in pm as always x 5 years. CPST reviewed: poor effort and so uninterpretable  Past, Family, Social reviewed: no change since last visit except as in HPI   Review of Systems  Constitutional: Negative for fever and unexpected weight change.  HENT: Negative for ear pain, nosebleeds, congestion, sore throat, rhinorrhea, sneezing, trouble swallowing, dental problem, postnasal drip and sinus pressure.   Eyes: Negative for redness and itching.  Respiratory: Positive for shortness of breath. Negative for cough, chest tightness and wheezing.   Cardiovascular: Negative for palpitations and leg swelling.  Gastrointestinal: Negative for nausea and vomiting.  Genitourinary: Negative for dysuria.  Musculoskeletal: Negative for joint swelling.  Skin: Negative for rash.  Neurological: Negative for headaches.  Hematological: Does not bruise/bleed easily.  Psychiatric/Behavioral: Negative for dysphoric mood. The patient is not nervous/anxious.        Objective:   Physical Exam  Nursing note and vitals reviewed. All below are unchanged Constitutional: He is oriented to person, place, and time. He appears well-developed and well-nourished. No distress.  HENT:  Head: Normocephalic and atraumatic.  Right Ear: External ear normal.  Left Ear: External ear normal.  Mouth/Throat: Oropharynx is clear and  moist. No oropharyngeal exudate.  Eyes: Conjunctivae and EOM are normal. Pupils are equal, round, and reactive to light. Right eye exhibits no discharge. Left eye exhibits no discharge. No scleral icterus.  Neck: Normal range of motion. Neck supple. No JVD present. No tracheal deviation present. No thyromegaly present.  Cardiovascular: Normal rate, regular rhythm and intact distal pulses.  Exam reveals no gallop and no friction rub.   No murmur heard. Pulmonary/Chest: Effort normal. No respiratory distress. He has no wheezes. He has no rales. He exhibits no tenderness. HE IS PUFFING and APPEARS TO BE MILDLY PANTING EVEN WHEN SITTING AND TALKING TO ME      Diminished air entry rt base  Abdominal: Soft. Bowel sounds are normal. He exhibits no distension and no mass. There is no tenderness. There is no rebound and no guarding.  Musculoskeletal: Normal range of motion. He exhibits no edema and no tenderness.       Walks with hunch  Lymphadenopathy:    He has no cervical adenopathy.  Neurological: He is alert and oriented to person, place, and time. He has normal reflexes. No cranial nerve deficit. Coordination normal.       Mild LUE tremor  Skin: Skin is warm and dry. No rash noted. He is not diaphoretic. No erythema. No pallor.  Psychiatric: He has a normal mood and affect. His behavior is normal. Judgment and thought content normal.         Assessment & Plan:

## 2011-06-22 NOTE — Telephone Encounter (Signed)

## 2011-06-22 NOTE — Patient Instructions (Signed)
Please continue your regular exercise routine and keep yourself as fit as you can REturn to see me in 9 months or sooner if needed If shortness of breath gets worse call or come sooner

## 2011-06-23 ENCOUNTER — Encounter: Payer: Medicare Other | Admitting: Internal Medicine

## 2011-06-24 ENCOUNTER — Encounter: Payer: Self-pay | Admitting: Internal Medicine

## 2011-06-24 NOTE — Assessment & Plan Note (Addendum)
Currently no furthre causes elicited other than left dipahrgam weakness, height loss with age, and ? Neuropathy. Repeat CPST not interpretable. Advised against diaprhagmatic plicatin by CVTS Dr Allyson Sabal of [facility]. Will continue to watch. Encouraged him to keep as fit as he can which he is doing a good job about  > 50% of this > 15 min visit spent in face to face counseling

## 2011-07-25 ENCOUNTER — Ambulatory Visit: Payer: Medicare Other | Admitting: Cardiology

## 2011-07-25 ENCOUNTER — Other Ambulatory Visit: Payer: Medicare Other | Admitting: *Deleted

## 2011-08-02 ENCOUNTER — Telehealth: Payer: Self-pay

## 2011-08-02 NOTE — Telephone Encounter (Signed)
.  [facility]    [facility] REQUESTING FAXED CBC LABS TO [id number]   BEST PHONE  [phone]

## 2011-08-02 NOTE — Telephone Encounter (Signed)
Pt has never been seen in epic or [facility] systems. Called [facility] to update them that based on this info we have no labs to send them. bf

## 2011-08-09 ENCOUNTER — Telehealth: Payer: Self-pay | Admitting: Internal Medicine

## 2011-08-09 ENCOUNTER — Other Ambulatory Visit (HOSPITAL_COMMUNITY): Payer: Self-pay | Admitting: Interventional Radiology

## 2011-08-09 ENCOUNTER — Other Ambulatory Visit: Payer: Self-pay | Admitting: Interventional Radiology

## 2011-08-09 DIAGNOSIS — I10 Essential (primary) hypertension: Secondary | ICD-10-CM

## 2011-08-09 DIAGNOSIS — N2889 Other specified disorders of kidney and ureter: Secondary | ICD-10-CM

## 2011-08-09 NOTE — Telephone Encounter (Signed)
Dr. Marchelle Gearing please advise if it is ok to order BUN and creatinine under your name for the patient in order for him to have  CT order by Dr. Fredia Sorrow? [name], CMA

## 2011-08-09 NOTE — Telephone Encounter (Signed)
That is fine 

## 2011-08-10 NOTE — Telephone Encounter (Signed)
I spoke with Tammy and advised we will order labs. She states she needs BUN and creatinine with GFR. Labs ordered. She will advise the pt. [name], CMA

## 2011-08-16 ENCOUNTER — Encounter: Payer: Self-pay | Admitting: Cardiology

## 2011-08-23 DIAGNOSIS — R0602 Shortness of breath: Secondary | ICD-10-CM

## 2011-08-24 ENCOUNTER — Ambulatory Visit (HOSPITAL_COMMUNITY)
Admission: RE | Admit: 2011-08-24 | Discharge: 2011-08-24 | Disposition: A | Discharge status: Home / Self Care | Payer: Medicare Other | Source: Ambulatory Visit | Attending: Urology | Admitting: Urology

## 2011-08-24 ENCOUNTER — Other Ambulatory Visit (HOSPITAL_COMMUNITY): Payer: Self-pay | Admitting: Urology

## 2011-08-24 DIAGNOSIS — D4959 Neoplasm of unspecified behavior of other genitourinary organ: Secondary | ICD-10-CM

## 2011-08-24 DIAGNOSIS — J984 Other disorders of lung: Secondary | ICD-10-CM | POA: Insufficient documentation

## 2011-08-30 ENCOUNTER — Other Ambulatory Visit: Payer: Medicare Other

## 2011-08-30 ENCOUNTER — Other Ambulatory Visit (HOSPITAL_COMMUNITY): Payer: Medicare Other

## 2011-08-30 ENCOUNTER — Ambulatory Visit (INDEPENDENT_AMBULATORY_CARE_PROVIDER_SITE_OTHER): Payer: 59 | Admitting: Internal Medicine

## 2011-08-30 VITALS — BP 128/84 | HR 60 | Temp 97.7°F | Wt 204.0 lb

## 2011-08-30 DIAGNOSIS — J069 Acute upper respiratory infection, unspecified: Secondary | ICD-10-CM

## 2011-08-30 MED ORDER — AZITHROMYCIN 250 MG PO TABS: ORAL_TABLET | ORAL | Status: AC

## 2011-08-30 NOTE — Patient Instructions (Signed)
Rest, fluids , tylenol For cough, take Mucinex DM twice a day as needed  Take the antibiotic as prescribed (zpack) only if not better in 3 to 4 days Call if no better in few days Call anytime if the symptoms are severe, you have high fever, short of breath  ---- Please schedule a visit with Dr Alfonse Flavors
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
## 2011-08-31 ENCOUNTER — Other Ambulatory Visit: Payer: Self-pay | Admitting: Internal Medicine

## 2011-08-31 ENCOUNTER — Encounter: Payer: Self-pay | Admitting: Internal Medicine

## 2011-08-31 NOTE — Telephone Encounter (Signed)
TSH 244.9 

## 2011-09-01 ENCOUNTER — Ambulatory Visit
Admission: RE | Admit: 2011-09-01 | Discharge: 2011-09-01 | Disposition: A | Discharge status: Home / Self Care | Payer: 59 | Source: Ambulatory Visit | Attending: Interventional Radiology | Admitting: Interventional Radiology

## 2011-09-01 DIAGNOSIS — N2889 Other specified disorders of kidney and ureter: Secondary | ICD-10-CM

## 2011-09-01 NOTE — Progress Notes (Signed)
Denies hematuria or problems w/ urination.  Denies pain associated with cryoablation.  States that he feels that his biggest problems are vertigo & phrenic nerve damage from mitral valve repair surgery 96/22/2010).  Has been evaluated at [facility] for pulmonary issues.  Also, states that his endurance has been affected by his pulmonary issues.

## 2011-10-03 ENCOUNTER — Other Ambulatory Visit: Payer: Self-pay | Admitting: Cardiology

## 2011-10-03 MED ORDER — AMOXICILLIN 500 MG PO CAPS: ORAL_CAPSULE | ORAL | Status: DC

## 2011-10-03 NOTE — Telephone Encounter (Signed)
Please return call to patient regarding clearance for upcoming dentist appnt, he can be reached at [phone].

## 2011-10-03 NOTE — Telephone Encounter (Signed)
Spoke with pt. He is aware he should continue to take amoxicillin prior to dental appointments.

## 2011-10-03 NOTE — Telephone Encounter (Signed)
He should take the amoxicillin

## 2011-10-03 NOTE — Telephone Encounter (Signed)
Spoke with pt. Pt has dental appt coming up soon. Pt states in the past he has taken amoxicillin 2G prior to dental appt. Pt is asking if this is still necessary. Pt had mitral valve repair in June 2010. I will forward to Dr Shirlee Latch for review.

## 2011-10-24 ENCOUNTER — Other Ambulatory Visit: Payer: Self-pay | Admitting: Internal Medicine

## 2011-10-24 NOTE — Telephone Encounter (Signed)
TSH 244.9 

## 2011-10-26 ENCOUNTER — Encounter: Payer: 59 | Admitting: Internal Medicine

## 2011-10-26 DIAGNOSIS — Z0289 Encounter for other administrative examinations: Secondary | ICD-10-CM

## 2011-11-22 ENCOUNTER — Other Ambulatory Visit: Payer: Self-pay | Admitting: Internal Medicine

## 2011-11-22 NOTE — Telephone Encounter (Signed)
TSH 244.9 

## 2011-12-20 ENCOUNTER — Telehealth: Payer: Self-pay | Admitting: Pulmonary Disease

## 2011-12-20 NOTE — Telephone Encounter (Signed)
LMOMTCB x 1 for pt at 2nd number he gave as contact. Was unable to leave a msg at the home number because there is no voicemail  

## 2011-12-21 NOTE — Telephone Encounter (Signed)
Spoke with patient-states he has noticed his breathing getting worse since last seen in December by MR; pt would like to see MR only in august. I explained to patient that MR had no openings at this time but he requested I send message to MR to advise a date and time in August he can see patient. Pt is aware that MR is out of the office until August. He is willing to wait until then to get a call back.   Alternate phone number as pt lives 2 hours away in [place]: [phone].   Please advise.

## 2011-12-24 NOTE — Telephone Encounter (Signed)
Is it just forhim or his wife too ? I can addhim alone  for august 9th towards the middle making it 14 patients the maximum  for the half day. If it is his wife too, I need to look alternative

## 2011-12-26 NOTE — Telephone Encounter (Signed)
LMOMTCB x 1 

## 2011-12-28 NOTE — Telephone Encounter (Signed)
LMOMTCB x 1 

## 2011-12-29 NOTE — Telephone Encounter (Signed)
LMTCB

## 2011-12-30 NOTE — Telephone Encounter (Signed)
Per protocol I will sign off on message and await call back. Damonte Frieson, CMA  

## 2012-01-02 ENCOUNTER — Ambulatory Visit (INDEPENDENT_AMBULATORY_CARE_PROVIDER_SITE_OTHER): Payer: Medicare Other | Admitting: Internal Medicine

## 2012-01-02 ENCOUNTER — Encounter: Payer: Self-pay | Admitting: Internal Medicine

## 2012-01-02 VITALS — BP 116/78 | HR 55 | Temp 98.0°F | Wt 204.0 lb

## 2012-01-02 DIAGNOSIS — R0600 Dyspnea, unspecified: Secondary | ICD-10-CM | POA: Insufficient documentation

## 2012-01-02 DIAGNOSIS — R0989 Other specified symptoms and signs involving the circulatory and respiratory systems: Secondary | ICD-10-CM

## 2012-01-02 DIAGNOSIS — R109 Unspecified abdominal pain: Secondary | ICD-10-CM

## 2012-01-02 DIAGNOSIS — R0609 Other forms of dyspnea: Secondary | ICD-10-CM

## 2012-01-02 DIAGNOSIS — R42 Dizziness and giddiness: Secondary | ICD-10-CM

## 2012-01-02 LAB — POCT URINALYSIS DIPSTICK
Blood, UA: NEGATIVE
Protein, UA: NEGATIVE
Spec Grav, UA: >=1.03
Urobilinogen, UA: 0.2

## 2012-01-02 MED ORDER — GABAPENTIN 100 MG PO CAPS: ORAL_CAPSULE | ORAL | Status: DC

## 2012-01-02 NOTE — Patient Instructions (Addendum)
Repeat the isometric exercises discussed 4- 5 times prior to standing if you've been seated for a period of time.  Order for x-rays entered into  the computer; these will be performed at [address] across from [facility]. No appointment is necessary. Please try to go on My Chart within the next 24 hours to allow me to release the results directly to you.  

## 2012-01-02 NOTE — Progress Notes (Signed)
Subjective:    Patient ID: Adrian Quint., male    DOB: 1928-02-03, 78 y.o.   MRN: [id number]  HPI He describes several concerns. In the last 10 days he's experienced intermittent sharp LEFT flank pain. This has been awakening him at night. It does improve with position change in the bed.  He has dyspnea which is chronic in the context of  RIGHT phrenic nerve  and diaphragm paralysis. This has been a chronic problem for which she seen Dr. Williemae Area at [facility]; he has a followup appointment 9/13. Dyspnea has been progressive over the last year  He's had exacerbation of his vertigo; describes this as mild. His vertigo is worse in crowds. This has been present for at least 5 years and was evaluated by Dr. Sandria Manly, neurologist.He takes [facility] as needed.  He's also had some postural lightheadedness     Review of Systems He denies dysuria, hematuria, or pyuria. There's been no bowel changes such as diarrhea, constipation, melena, rectal bleeding. There's been no cardiac prodrome to his vertigo. He specifically denies palpitations, or irregular rhythm , or heart rate change. He also has not had any neurologic prodrome prior to the vertigo such as headache, numbness and tingling in the limbs, limb weakness, or coordination issues. There's been no syncope or seizure activity. He denies any recent upper respiratory tract infection He denies associated signs and symptoms such as visual change (blurred/double/loss); hearing loss/tinnitus; or nausea/sweating. He has had no urinary or stool incontinence        Objective:   Physical Exam Gen.: adequately nourished in appearance. Alert, appropriate and cooperative throughout exam.  Eyes: No corneal or conjunctival inflammation noted.  Extraocular motion intact w/o nystagmus. Ears: External  ear exam reveals no significant lesions or deformities. Canals clear .TMs normal. Hearing is grossly decreased bilaterally. Nose: External nasal exam reveals no deformity  or inflammation. Nasal mucosa are pink and moist. No lesions or exudates noted. Septum appears to have small perforations superiorly Mouth: Oral mucosa and oropharynx reveal no lesions or exudates. Teeth in good repair. Neck: No deformities, masses, or tenderness noted. Range of motion normal. Lungs: Breath sounds are decreased-absent right lower lobe. Minimal rales left lower lobe. No increased work of breathing. O2 sats 97% Heart: Slow rate and regular rhythm. Normal S1 and S2. No gallop, click, or rub. No murmur. Abdomen: Bowel sounds normal; abdomen soft and nontender. No masses, organomegaly or hernias noted.                                                                                 Musculoskeletal/extremities: He has dramatic kyphotic and scoliotic curvature of the spine. Well-healed lumbosacral operative scar present.  No clubbing, cyanosis, edema, or deformity noted. Tone & strength  normal.Joints normal. Nail health  good. Vascular: Carotid, radial artery, dorsalis pedis and  posterior tibial pulses are full and equal. No bruits present. Neurologic: Alert and oriented x3. Deep tendon reflexes symmetrical and normal.  No tremor. Romberg testing and finger to nose testing is negative        Skin: Intact without suspicious lesions or rashes. Lymph: No cervical, axillary lymphadenopathy present. Psych: Mood and affect are normal. Normally  interactive                                                                                        Assessment & Plan:    #1 radicular flank pain; the most likely etiology is thoracic radiculopathy  #2 vertigo; recent exacerbation  #3 shortness of breath the context of phrenic nerve and diaphragm paralysis. By history dyspnea is progressive  Plan: see orders & recommendations

## 2012-01-03 ENCOUNTER — Ambulatory Visit (INDEPENDENT_AMBULATORY_CARE_PROVIDER_SITE_OTHER)
Admission: RE | Admit: 2012-01-03 | Discharge: 2012-01-03 | Disposition: A | Discharge status: Home / Self Care | Payer: Medicare Other | Source: Ambulatory Visit | Attending: Internal Medicine | Admitting: Internal Medicine

## 2012-01-03 DIAGNOSIS — R0989 Other specified symptoms and signs involving the circulatory and respiratory systems: Secondary | ICD-10-CM

## 2012-01-03 DIAGNOSIS — R0609 Other forms of dyspnea: Secondary | ICD-10-CM

## 2012-01-03 LAB — CBC WITH DIFFERENTIAL/PLATELET
Basophils Relative: 0.3 % (ref 0.0–3.0)
Eosinophils Absolute: 0.1 10*3/uL (ref 0.0–0.7)
MCHC: 33.5 g/dL (ref 30.0–36.0)
MCV: 98 fl (ref 78.0–100.0)
Monocytes Absolute: 0.1 10*3/uL (ref 0.1–1.0)
Neutro Abs: 1.7 10*3/uL (ref 1.4–7.7)
Neutrophils Relative %: 26.4 % — ABNORMAL LOW (ref 43.0–77.0)
RBC: 4.73 Mil/uL (ref 4.22–5.81)

## 2012-01-31 ENCOUNTER — Other Ambulatory Visit: Payer: Self-pay | Admitting: Internal Medicine

## 2012-02-24 ENCOUNTER — Ambulatory Visit (INDEPENDENT_AMBULATORY_CARE_PROVIDER_SITE_OTHER): Payer: Medicare Other | Admitting: Internal Medicine

## 2012-02-24 ENCOUNTER — Encounter: Payer: Self-pay | Admitting: Internal Medicine

## 2012-02-24 VITALS — BP 122/84 | HR 59 | Temp 97.4°F | Resp 12 | Ht 69.08 in | Wt 199.2 lb

## 2012-02-24 DIAGNOSIS — I10 Essential (primary) hypertension: Secondary | ICD-10-CM

## 2012-02-24 DIAGNOSIS — Z23 Encounter for immunization: Secondary | ICD-10-CM

## 2012-02-24 DIAGNOSIS — E785 Hyperlipidemia, unspecified: Secondary | ICD-10-CM | POA: Insufficient documentation

## 2012-02-24 DIAGNOSIS — D72820 Lymphocytosis (symptomatic): Secondary | ICD-10-CM

## 2012-02-24 DIAGNOSIS — E039 Hypothyroidism, unspecified: Secondary | ICD-10-CM

## 2012-02-24 DIAGNOSIS — Z0000 Encounter for general adult medical examination without abnormal findings: Secondary | ICD-10-CM

## 2012-02-24 HISTORY — DX: Hyperlipidemia, unspecified: E78.5

## 2012-02-24 NOTE — Patient Instructions (Addendum)
The persistent elevation in lymphocyte count will need to be evaluated by a Hematologist.This can be done here in [place] or @ [facility]. Review and correct the record as indicated. Please share record with all medical staff seen.   If you activate My Chart; the results can be released to you as soon as they populate from the lab. If you choose not to use this program; the labs have to be reviewed, copied & mailed causing a delay in getting the results to you.

## 2012-02-24 NOTE — Progress Notes (Signed)
Subjective:    Patient ID: Adrian Quint., male    DOB: [birth year], 78 y.o.   MRN: [id number]  HPI Medicare Wellness Visit:  The following psychosocial & medical history were reviewed as required by Medicare.   Social history: caffeine: 1 cup coffee/ day , alcohol:  None since 01/09/12 ,  tobacco use : never  & exercise : golf, swimming  3X/ week .   Home & personal  safety / fall risk: increased due to vertigo & peripheral neuropathy but no falls to date, activities of daily living: no limitations , seatbelt use : yes , and smoke alarm employment : yes .  Power of Attorney/Living Will status :yes  Vision ( as recorded per Nurse) & Hearing  evaluation :  Ophth exam due.No hearing exam Orientation :oriented X 3 , memory & recall :excellent recall of complicated PMH & dates,  math testing: excellent,and mood & affect : normal . Depression / anxiety: denied Travel history :  76 [place] , immunization status : Flu today , transfusion history:  no, and preventive health surveillance ( colonoscopies, BMD , etc as per protocol/ [facility]): colonoscopy up to date, Dental care:  Every 3 mos related root canals & gum disease . Chart reviewed &  Updated. Active issues reviewed & addressed.       Review of Systems  He has an appointment at [facility] with Dr. [name] 02/27/12 to assess  possible phrenic nerve surgery to treat his chronic dyspnea related to intraoperative injury. His shortness of breath on exertion is stable. Swimming has been of benefit; he is no longer short of breath swimming even after 40 laps. He does get short of breath after walking 50 feet.He denies cough , hemoptysis or sputum production.  He denies chest pain, palpitations, claudication, or edema. He does not have paroxysmal nocturnal dyspnea. He does have tingling in his feet related to his peripheral neuropathy     Objective:   Physical Exam Gen.:Appears younger than stated age  ; well-nourished in appearance. Alert, appropriate and  cooperative throughout exam. Head: Normocephalic without obvious abnormalities;  pattern alopecia .  Eyes: No corneal or conjunctival inflammation noted.  Extraocular motion intact. Vision grossly normal with lenses. Ears: External  ear exam reveals no significant lesions or deformities. Wax on R; L TM normal. Hearing is grossly decreased bilaterally. Nose: External nasal exam reveals no deformity or inflammation. Nasal mucosa are pink and moist. No lesions or exudates noted.  Mouth: Oral mucosa and oropharynx reveal no lesions or exudates. Teeth in good repair. Neck: No deformities, masses, or tenderness noted. Range of motion & Thyroid normal Lungs: Normal respiratory effort; chest expands symmetrically. Lungs are clear to auscultation without rales, wheezes, or increased work of breathing. Decreased BS RLL Heart: Slow irregular  rhythm. Normal S1; accentuated  S2. No gallop, click, or rub.  Abdomen: Bowel sounds normal; abdomen soft and nontender. No masses, organomegaly or hernias noted. Genitalia: deferred due to age   .                                                                                   Musculoskeletal/extremities: Lordosis noted of the lower  thoracic  spine. No  cyanosis, edema, or deformity noted. Range of motion  normal .Tone & strength  normal.Joints normal. Nail health good; minor loss of nail / skin angle. Vascular: Carotid, radial artery, dorsalis pedis and  posterior tibial pulses are full and equal. No bruits present. Neurologic: Alert and oriented x3. Deep tendon reflexes symmetrical and normal.          Skin: Intact without suspicious lesions or rashes. Marked solar changes of head Lymph: No cervical, axillary lymphadenopathy present. Psych: Mood and affect are normal. Normally interactive                                                                                        Assessment & Plan:  #1 Medicare Wellness Exam; criteria met ; data entered #2 Problem  List reviewed ; Assessment/ Recommendations made Plan: see Orders

## 2012-02-27 DIAGNOSIS — G588 Other specified mononeuropathies: Secondary | ICD-10-CM | POA: Insufficient documentation

## 2012-02-27 DIAGNOSIS — R0600 Dyspnea, unspecified: Secondary | ICD-10-CM | POA: Insufficient documentation

## 2012-02-27 DIAGNOSIS — G5680 Other specified mononeuropathies of unspecified upper limb: Secondary | ICD-10-CM | POA: Insufficient documentation

## 2012-03-02 ENCOUNTER — Telehealth: Payer: Self-pay | Admitting: Internal Medicine

## 2012-03-02 NOTE — Telephone Encounter (Signed)
Message copied by Marshell Garfinkel on Fri Mar 02, 2012 10:41 AM ------      Message from: Pecola Lawless      Created: Wed Feb 29, 2012 12:34 PM       Please  schedule fasting Labs : BMET,Lipids, hepatic panel,  TSH if not done @ [facility]      ----- Message -----         From: SYSTEM         Sent: 02/29/2012  12:03 AM           To: Pecola Lawless, MD

## 2012-03-02 NOTE — Progress Notes (Signed)
Lmovm for pt to call office. °

## 2012-03-02 NOTE — Telephone Encounter (Signed)
Lmovm for pt to call office. °

## 2012-03-20 ENCOUNTER — Ambulatory Visit (INDEPENDENT_AMBULATORY_CARE_PROVIDER_SITE_OTHER): Payer: Medicare Other | Admitting: Internal Medicine

## 2012-03-20 ENCOUNTER — Encounter: Payer: Self-pay | Admitting: Internal Medicine

## 2012-03-20 ENCOUNTER — Telehealth: Payer: Self-pay | Admitting: Internal Medicine

## 2012-03-20 VITALS — BP 108/62 | HR 50 | Temp 96.7°F | Ht 71.0 in | Wt 204.2 lb

## 2012-03-20 DIAGNOSIS — R0989 Other specified symptoms and signs involving the circulatory and respiratory systems: Secondary | ICD-10-CM

## 2012-03-20 DIAGNOSIS — Z6825 Body mass index (BMI) 25.0-25.9, adult: Secondary | ICD-10-CM

## 2012-03-20 DIAGNOSIS — E663 Overweight: Secondary | ICD-10-CM | POA: Insufficient documentation

## 2012-03-20 DIAGNOSIS — R0609 Other forms of dyspnea: Secondary | ICD-10-CM

## 2012-03-20 NOTE — Assessment & Plan Note (Addendum)
I have asked Adrian West to follow the [facility] Low Glycemic Diet plan. In addition, I have given him the following advice below. This should help shed off weight and waist and help his cardiovascular mortality  #WEight Management    - we discussed extensively about weight management   - follow low glycemic diet plan that I outlined for you after extensive discussion. Do not follow other plans  - General  - drink lot of water  - avoid all moderate and high glycemic foods especially bad fruits, breads, pastas, fried foods, battered foods, sugary foods  - make non-starchy vegetables your base in terms of volume you eat  - always make sure you balance good carbs, good protein and good fat source  - good carbs are non-starchy vegetables, uncanned beans in the left column and low glycemic fruits in the left colum  - good protein source is egg white, beans, tofu, fish, chicken breast, fish, [place] and bison. Remember meat has to be skinless  - good healthy fat source is nuts, and fish   - focusing on eating right healthy foods (left lane) and avoiding unhealthy foods (middle and right lane) is better way to lose weight than to go hypo-caloric  - focus on staying full by eating right  - having a daily and weekly plan for what you will eat and where you will eat depending on your work, social life schedule is very important   - watch out for misleading labels on grocery aisle: High Fiber and Low Fat labeled foods generally are high in bad carbs or sugar  - measure weight once  a week  - discipline and attitude is key. Do not care for anyone else's opinion or feelings. Only yours matters   - For breakfast  - most important meal of the day. So, eat daily breakfast. Do not skip   - recommend 1/2 to 1 cup steel cut oat meal or 1/2 to 1 cup fiber one 60 cal   Or  1 to 1.5 cups Kashi go-lean with non-fat plain milk or 60 Cal Silk Soy mild. Can add Berries. Can have egg at same time for breakfast  - For snacks  -  recommend total 2-3 snacks per day  - snack should be light and filling  - best times are between breakfast and lunch, lunch and dinner and sometimes post-dinner snack  - Nut are great snacks. Stick to low glycemic nuts (less than 50gm per day) and eat only the nuts in the left lane like peanuts, pista, almond, walnut  - Low glycemic fruits are great snacks. Have 1-2 servings each day of fruits from the left lane   - If you like yogurt or cottage cheese - recommend Oikos or Fage 0% greek yogourt or Plan non-fat yogurt or Breakstone non-fat cottage cheese. Theyse have the least sugar. Do no exceed 100-200 gram per day. Fruit yogurts are the worst  - For Lunch and dinner  - unlimited non-starchy vegetable (prefer raw fresh or roasted or grilled) with skinless chicken or fish  - Special Notes  - Nuts: Nut are great snacks and have heart benefits. Stick to low glycemic nuts (less than 50gm per day) and eat only the nuts in the left lane like peanuts, pista, almond, walnuts  -  Ok to eat above nuts daily but only < 50gm/day  - If you eat more than 50gm/day then you run risk of eating too many calories or saturated fat  - AVoid nuts  glazed with sugar. Nuts have to be in salted/original form or roasted   - Fruits: Eat 1-2 fresh fruit servings daily but fruits can be dangerous because of high sugar content. So, choose your fruits wisely. Eat only the low glycemic fruits (left lane). Eat them fresh.  Do not eat them canned  - Avoid all fresh juices except if you use the low glycemic fruits and make them yourself without adding extra sugar   - Dairy: Is optional. Eat zero fat or low fat, fruit free yogurts or cottage cheese but not more than 100-200g per day  - Restaurant  - all restaurants have bad and good choices. Even fast food restaurants offer you good choices  - at restaurants do no fall prey to social pressure.. One way to eat healthy at restaurant is to eat healthy snack or light healthy meal  before you go to restaurant so that will prevent your cravings  - Restaurants with worst choices: [place] (except Chipotle, or [employer]), [place], [place]. At these restaurants avoid the bread, curry, fried and battered foods and chips  - Restaurants with best choices: greek, [place], [place], [place] (again here avoid bread, deep fried stuffed and pasta)  - Restaurants with Ok choice: McDonald's, TIP[employer], Applebees (again here avoid the bread, fried stuff, fried meat)  - Always ask for grilled meat or vegetables, and fresh salad choices (get your salad dressing as low fat and to the side)   > 50% of this 40 min visit spent in face to face counseling (30 min visit converted to 40 min)

## 2012-03-20 NOTE — Progress Notes (Signed)
Subjective:    Patient ID: Adrian Quint., male    DOB: Sep 20, 1927, 78 y.o.   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 No improvement with rehab.  2. Restricted PFTs out of pproportion to above. Seen by Dr. Katrine Coho at [facility] 2011 - suspects associated loss of height and ? etoh neuropathy (drinks 1-3 glasses wine nightly) playing a role.   OV 02/14/11: Followup for above. Last seen 08/03/10. Since then he feels dyspnea is worse. STates brough on by exertion and relieved by rest. Feels dyspneic at rest too. He feels he is dyspneic sitting in office and talking to me. Feels only 50% of lung function. This morning he told his PMD, dyspnea is stable and even at last visit he stated to me that he feels only at 50% but nevertheless he tells me he has declined in past year. Hitting golf ball 30 yards less than last summer. He is wondering if he is hypoxemic.  Due to see Dr. Katrine Coho in few weeks. Of note, when we exerted him in office - 185 x 3 laps; he did not desaturate but HR rose to 160/min. Spirometry Fev1 1.3L/41%, FVC 2.4L/57%,  Ratio 54% - this is similar to before though I cannot remember if there was obstructive component    OV 04/01/11: Followup dyspnea as above. Supppsed to have seen Dr Shirlee Latch for exertional tachycardia; got cleared by Dr Patty Sermons to switch to Dr Shirlee Latch his wife's cardiologist but he is yet to see Dr Shirlee Latch for unclear reasons. He did followup with  Dr Katrine Coho of pulmonary at [facility] and has been told that diaphragm paralysis is probably irreversible and conservative measures advised (no note yet from [facility]). He continues to play golf twice a week and reports good spirits but is bothered by the dyspnea. Still c/o persistent class 3-4 dyspnea. Very frustrating. Also has ongoing vertigo.  He again demonstrates heart rate variability. Got very tachycardic with walking in office and bradycardic with  orthostatics.  Prior to th September visit I do not recolllect this tachycardia. Never informed of it by the rehab nurses in past. He has had CPST 09/07/09 and his peak HR was only 115/min at that time.  He denies any neuromuscular symptoms  Specificially, at rest pulse ox 93%/HR 72 and we walked him 185 feet. Pulse ox stayed at 92 but HR rose to 150/min. Checked orthostats SUPINE: BP 122/82, HR 66, pulse ox 94%, -> STANDING BP 128/88, HR DRPOPPED TO 36 and he got DIZZY, and pulse ox 98%  REC Shortness of breath  - on exam and breathing test last visit - it appears that the right diaphragm is still paralyzed and there is an element of obstruction but inhalers have not helped in past for you. So, I will hold off on inhaler for now till you see cardiology for heart rate  - when we walked you again today Your heart rate rose to 150/min on our monitor  - Also, when we stood you up your heart rate dropped to 36 and you got dizzy  - So please see Dr Shirlee Latch (your wife's cardiologist) or Dr Graciela Husbands cardiology for evaluation - ASAP; Dr Patty Sermons ok with switch to Dr Shirlee Latch.  - I have spoken to Dr Graciela Husbands a heart electricity / rhytym doctor and the [facility] cardiology office should call you hopefully later today  - If you have not heard from cardiology by  Monday, please call our office  - return to see me in less than 1 month; after seeing Dr Shirlee Latch   OV 05/13/11: [place] dyspnea. In interim has seen cardiology Dr Shirlee Latch whose findings are: holter monitor showing short runs of NSVT and 19% PVCs. Echo showed preserved LV systolic function and a repaired mitral valve with probably mild MR and minimal MS. There was borderline pulmonary HTN and a normal RV. I had him do an ETT-myoview. Exercise tolerance was below average (4 minutes) but there was no evidence for ischemia or infarction. He has him on Toprol XL 25 mg bid to try to suppress PVCs and lasix. Despite this Mr Bruster tells me that dyspnea is no better. Today  walking him 185 feet x 3 laps; HR stayed < 90 but did drop pulse ox to 87% (? Aritifact) but if true this is first occurrence for desaturation. No other new symptoms.   OV 06/22/2011 Had espophageal dilatation yestrerday -  Feels some relief wit hdyspnea. Has seen Dr Allyson Sabal CVTS at [facility] 06/10/2011  And advised against thoracotomy diaphragm plication (note reviewed). FEels tachycardia control has not helped dyspnea. Overall dyspnea is same but stable. Swimming 3 times a week. Playing golf. No decrease in golf strength in 5 years per hx; still hitting 200 yards. Thyroid function stable as of April 2012. Feels tired in pm as always x 5 years. CPST reviewed: poor effort and so uninterpretable  Past, Family, Social reviewed: no change since last visit except as in HPI   Please continue your regular exercise routine and keep yourself as fit as you can  REturn to see me in 9 months or sooner if needed  If shortness of breath gets worse call or come sooner  OV 03/20/2012 Followup chronic dyspnea  Dyspnea -  Worse somewhat. Rates it as rather severe. Says he is out of breath sitting and talking to me. Worsened by exertion even walking 50 feet. However, still playing golf 2 times a week. STill hitting 200 yards but scoring is worse; putts poorly. Says his score is worsened from mid 80s to mid 90s and is related to overall decline in game.STill swimming 3 times a week; that helps. Says he does not feel dyspneic swimming. Dyspnea is not related to posture.  Associated fatigue is stable to worse. Walk test in office 185 feet x 3 laps: pulse ox 95 -> 93%, HR 60-> 80.In office, I see him puffing his mouth during expiration - just like before but perhaps worse this time  Of note, has not seen Dr Shirlee Latch of [facility] in a year but plans to see me him. Saw Dr Katrine Coho [facility] pulmonary) in sept 2013; notes pending but he says Dr Katrine Coho has not added anything new to diagnosis or Rx.   Also of note, is his weight. THere  is no weight gain but he is overweight with truncal obesity. While he does aerobic exercises. He does not do resistance training. He does not use a Systems analyst but knows one he can hire. His  Estimated Body mass index is 28.48 kg/(m^2) as calculated from the following:   Height as of this encounter: 5\' 11" (1.803 m).   Weight as of this encounter: 204 lb 3.2 oz(92.625 kg).     Past, Family, Social reviewed: no change since last visit    Review of Systems  Constitutional: Positive for fatigue. Negative for fever and unexpected weight change.  HENT: Negative for ear pain, nosebleeds, congestion, sore throat, rhinorrhea,  sneezing, trouble swallowing, dental problem, postnasal drip and sinus pressure.   Eyes: Negative for redness and itching.  Respiratory: Positive for shortness of breath. Negative for cough, chest tightness and wheezing.   Cardiovascular: Negative for palpitations and leg swelling.  Gastrointestinal: Negative for nausea and vomiting.  Genitourinary: Negative for dysuria.  Musculoskeletal: Negative for joint swelling.  Skin: Negative for rash.  Neurological: Negative for headaches.  Hematological: Does not bruise/bleed easily.  Psychiatric/Behavioral: Negative for dysphoric mood. The patient is not nervous/anxious.        Objective:   Physical Exam Nursing note and vitals reviewed. All below are unchanged Constitutional: He is oriented to person, place, and time. He appears well-developed and well-nourished. No distress.  HENT:  Head: Normocephalic and atraumatic.  Right Ear: External ear normal.  Left Ear: External ear normal.  Mouth/Throat: Oropharynx is clear and moist. No oropharyngeal exudate.  Eyes: Conjunctivae and EOM are normal. Pupils are equal, round, and reactive to light. Right eye exhibits no discharge. Left eye exhibits no discharge. No scleral icterus.  Neck: Normal range of motion. Neck supple. No JVD present. No tracheal deviation present. No  thyromegaly present.  Cardiovascular: Normal rate, regular rhythm and intact distal pulses.  Exam reveals no gallop and no friction rub.   No murmur heard. Pulmonary/Chest: Effort normal. No respiratory distress. He has no wheezes. He has no rales. He exhibits no tenderness.  HE IS PUFFING and APPEARS TO BE MILDLY PANTING EVEN WHEN SITTING AND TALKING TO ME      Diminished air entry rt base  Abdominal: Soft. Bowel sounds are normal. He exhibits no distension and no mass. There is no tenderness. There is no rebound and no guarding.  Musculoskeletal: Normal range of motion. He exhibits no edema and no tenderness.       Walks with hunch  Lymphadenopathy:    He has no cervical adenopathy.  Neurological: He is alert and oriented to person, place, and time. He has normal reflexes. No cranial nerve deficit. Coordination normal.       Mild LUE tremor  Skin: Skin is warm and dry. No rash noted. He is not diaphoretic. No erythema. No pallor.  Psychiatric: He has a normal mood and affect. His behavior is normal. Judgment and thought content normal.          Assessment & Plan:

## 2012-03-20 NOTE — Telephone Encounter (Signed)
Fax note to Dr Lorane Gell at [facility]. Thanks, MR

## 2012-03-20 NOTE — Patient Instructions (Addendum)
In order to help avoid pressure on your diaphragm and to gain overall muscle strength  - start incentive spirometry 10times each hour during your awake hours  - you need to lose atleast 12 pounds which wil help ease off some pressure on your abdomen fat which will help your diaphragm   -  For this  start low glycemic diet - see attached diet sheet. Avoid middle column and right column foods. Eat only left column foods. Follow my diet instructions below  - hire a Systems analyst and work on Emergency planning/management officer - explicitly ask him to gently work you on your core muscle and overall strength  - followup with me in 2 months    #Weight control #WEight Management   - follow low glycemic diet plan that I outlined for you. Do not follow other plans  - General  - drink lot of water  - avoid all moderate and high glycemic foods especially bad fruits, breads, pastas, fried foods, battered foods, sugary foods  - make non-starchy vegetables your base in terms of volume you eat  - always make sure you balance good carbs, good protein and good fat source  - good carbs are non-starchy vegetables, uncanned beans in the left column and low glycemic fruits in the left colum  - good protein source is egg white, beans, tofu, fish, chicken breast, fish, [place] and bison. Remember meat has to be skinless  - good healthy fat source is nuts, and fish   - focusing on eating right healthy foods (left lane) and avoiding unhealthy foods (middle and right lane) is better way to lose weight than to go hypo-caloric  - focus on staying full by eating right  - having a daily and weekly plan for what you will eat and where you will eat depending on your work, social life schedule is very important   - watch out for misleading labels on grocery aisle: High Fiber and Low Fat labeled foods generally are high in bad carbs or sugar  - measure weight once  a week  - discipline and attitude is key. Do not care for anyone else's  opinion or feelings. Only yours matters   - For breakfast  - most important meal of the day. So, eat daily breakfast. Do not skip   - recommend 1/2 to 1 cup steel cut oat meal or 1/2 to 1 cup fiber one 60 cal   Or  1 to 1.5 cups Kashi go-lean with non-fat plain milk or 60 Cal Silk Soy mild. Can add Berries. Can have egg at same time for breakfast  - For snacks  - recommend total 2-3 snacks per day  - snack should be light and filling  - best times are between breakfast and lunch, lunch and dinner and sometimes post-dinner snack  - Nut are great snacks. Stick to low glycemic nuts (less than 50gm per day) and eat only the nuts in the left lane like peanuts, pista, almond, walnut  - Low glycemic fruits are great snacks. Have 1-2 servings each day of fruits from the left lane   - If you like yogurt or cottage cheese - recommend Oikos or Fage 0% greek yogourt or Plan non-fat yogurt or Breakstone non-fat cottage cheese. Theyse have the least sugar. Do no exceed 100-200 gram per day. Fruit yogurts are the worst  - For Lunch and dinner  - unlimited non-starchy vegetable (prefer raw fresh or roasted or grilled) with skinless chicken or fish  -  Special Notes  - Nuts: Nut are great snacks and have heart benefits. Stick to low glycemic nuts (less than 50gm per day) and eat only the nuts in the left lane like peanuts, pista, almond, walnuts  -  Ok to eat above nuts daily but only < 50gm/day  - If you eat more than 50gm/day then you run risk of eating too many calories or saturated fat  - AVoid nuts glazed with sugar. Nuts have to be in salted/original form or roasted   - Fruits: Eat 1-2 fresh fruit servings daily but fruits can be dangerous because of high sugar content. So, choose your fruits wisely. Eat only the low glycemic fruits (left lane). Eat them fresh.  Do not eat them canned  - Avoid all fresh juices except if you use the low glycemic fruits and make them yourself without adding extra  sugar   - Dairy: Is optional. Eat zero fat or low fat, fruit free yogurts or cottage cheese but not more than 100-200g per day  - Restaurant  - all restaurants have bad and good choices. Even fast food restaurants offer you good choices  - at restaurants do no fall prey to social pressure.. One way to eat healthy at restaurant is to eat healthy snack or light healthy meal before you go to restaurant so that will prevent your cravings  - Restaurants with worst choices: [place] (except Chipotle, or [employer]), [place], [place]. At these restaurants avoid the bread, curry, fried and battered foods and chips  - Restaurants with best choices: greek, [place], [place], [place] (again here avoid bread, deep fried stuffed and pasta)  - Restaurants with Ok choice: McDonald's, [employer], [employer]bees (again here avoid the bread, fried stuff, fried meat)  - Always ask for grilled meat or vegetables, and fresh salad choices (get your salad dressing as low fat and to the side)

## 2012-03-20 NOTE — Assessment & Plan Note (Addendum)
His dyspnea is perplexing. 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 He has quit alcohol intake. Subjectively dyspnea is worse. I am out of ideas.  Empirically will try to improve resp muscle strength and reduce workload off diaphragm. For this, a) star incentive spirometry; b) hire a Systems analyst and work on Emergency planning/management officer focusing on core strength (will have added benefits to prevent age related sarcopenia, and improve form and posture); c) reduce visceral obesity (see overweight section) through low glycemic diet (added benefit of cvs mortality).   If above, does not work then try neurontin, or tramadol or morphine for dyspnea  > 50% of this > visit spent in face to face counseling (30 min visit converted to 40 min)

## 2012-03-21 NOTE — Telephone Encounter (Signed)
Note faxed. Dosia Yodice, CMA  

## 2012-05-15 ENCOUNTER — Other Ambulatory Visit: Payer: Self-pay | Admitting: Cardiology

## 2012-05-22 ENCOUNTER — Ambulatory Visit: Payer: Medicare Other | Admitting: Internal Medicine

## 2012-05-24 ENCOUNTER — Ambulatory Visit: Payer: Medicare Other | Attending: Internal Medicine | Admitting: Physical Therapy

## 2012-05-24 DIAGNOSIS — R269 Unspecified abnormalities of gait and mobility: Secondary | ICD-10-CM | POA: Insufficient documentation

## 2012-05-24 DIAGNOSIS — R42 Dizziness and giddiness: Secondary | ICD-10-CM | POA: Insufficient documentation

## 2012-05-24 DIAGNOSIS — IMO0001 Reserved for inherently not codable concepts without codable children: Secondary | ICD-10-CM | POA: Insufficient documentation

## 2012-06-04 ENCOUNTER — Encounter: Payer: Medicare Other | Admitting: Physical Therapy

## 2012-06-05 ENCOUNTER — Ambulatory Visit: Payer: Medicare Other | Admitting: Physical Therapy

## 2012-06-07 ENCOUNTER — Ambulatory Visit: Payer: Medicare Other | Attending: Neurology | Admitting: Physical Therapy

## 2012-06-07 DIAGNOSIS — IMO0001 Reserved for inherently not codable concepts without codable children: Secondary | ICD-10-CM | POA: Insufficient documentation

## 2012-06-07 DIAGNOSIS — R42 Dizziness and giddiness: Secondary | ICD-10-CM | POA: Insufficient documentation

## 2012-06-07 DIAGNOSIS — R269 Unspecified abnormalities of gait and mobility: Secondary | ICD-10-CM | POA: Insufficient documentation

## 2012-06-12 ENCOUNTER — Encounter: Payer: Medicare Other | Admitting: Physical Therapy

## 2012-06-14 ENCOUNTER — Ambulatory Visit: Payer: Medicare Other | Admitting: Physical Therapy

## 2012-06-18 ENCOUNTER — Ambulatory Visit: Payer: Medicare Other | Admitting: Internal Medicine

## 2012-06-19 ENCOUNTER — Ambulatory Visit: Payer: Medicare Other | Admitting: Physical Therapy

## 2012-06-21 ENCOUNTER — Ambulatory Visit: Payer: Medicare Other | Admitting: Physical Therapy

## 2012-06-26 ENCOUNTER — Ambulatory Visit: Payer: Medicare Other | Admitting: Physical Therapy

## 2012-06-28 ENCOUNTER — Ambulatory Visit (INDEPENDENT_AMBULATORY_CARE_PROVIDER_SITE_OTHER): Payer: Medicare Other | Admitting: Cardiology

## 2012-06-28 ENCOUNTER — Ambulatory Visit: Payer: Medicare Other | Admitting: Physical Therapy

## 2012-06-28 ENCOUNTER — Encounter: Payer: Self-pay | Admitting: Cardiology

## 2012-06-28 ENCOUNTER — Telehealth: Payer: Self-pay | Admitting: Cardiology

## 2012-06-28 VITALS — BP 101/60 | HR 35 | Resp 18 | Ht 71.0 in | Wt 204.0 lb

## 2012-06-28 DIAGNOSIS — I4510 Unspecified right bundle-branch block: Secondary | ICD-10-CM

## 2012-06-28 DIAGNOSIS — I080 Rheumatic disorders of both mitral and aortic valves: Secondary | ICD-10-CM

## 2012-06-28 DIAGNOSIS — I4949 Other premature depolarization: Secondary | ICD-10-CM

## 2012-06-28 DIAGNOSIS — I493 Ventricular premature depolarization: Secondary | ICD-10-CM

## 2012-06-28 LAB — CBC WITH DIFFERENTIAL/PLATELET
Eosinophils Absolute: 0.1 10*3/uL (ref 0.0–0.7)
MCHC: 34 g/dL (ref 30.0–36.0)
MCV: 95.6 fl (ref 78.0–100.0)
Monocytes Absolute: 0.3 10*3/uL (ref 0.1–1.0)
Neutrophils Relative %: 41.4 % — ABNORMAL LOW (ref 43.0–77.0)
Platelets: 174 10*3/uL (ref 150.0–400.0)

## 2012-06-28 LAB — BASIC METABOLIC PANEL
BUN: 20 mg/dL (ref 6–23)
GFR: 71.81 mL/min (ref >60.00)
Potassium: 4.6 mEq/L (ref 3.5–5.1)
Sodium: 138 mEq/L (ref 135–145)

## 2012-06-28 NOTE — Assessment & Plan Note (Signed)
The patient has not been experiencing any symptoms of congestive heart failure.  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

## 2012-06-28 NOTE — Telephone Encounter (Signed)
PT WAS IN TODAY, HAD LOW BLOOD AND HEART COUNT AND WOULD LIKE TO KNOW THE REASON WHY THAT IS, WOULD LIKE AN OUTLINE EMAILED TO HIM @ [email or web address]

## 2012-06-28 NOTE — Patient Instructions (Signed)
STOP CAFFEINE INTAKE  Your physician recommends that you schedule a follow-up appointment in: 1 MONTH OV/EKG  Will obtain labs today and call you with the results (CBC/BMET)

## 2012-06-28 NOTE — Progress Notes (Signed)
Adrian Skeens Meller Jr. Date of Birth:  08/16/1927 [facility] [address] [place], [place]  [address] [phone]         Fax   [phone]  History of Present Illness: This 79 year old gentleman is seen as a work in.  He was at his physical therapist today and it was noted that his heart rate was low and he was complaining of some dizziness.  He was there to help with balance problems at the request of his neurologist Dr. Sandria Manly.  He has a complicated past cardiac history.  He has a history of mitral valve repair in 2010.  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  He has a history of chronic PVCs and chronic exertional dyspnea.  He has had a thorough pulmonary workup both here in [place] with Dr. Marchelle Gearing as well as at [facility].  He is also followed closely by his PCP Dr. Alwyn Ren.  His last echocardiogram was 04/04/11 which showed an ejection fraction of 55-60% with grade 1 diastolic dysfunction.  He was status post mitral valve repair with minimal mitral stenosis and mild mitral regurgitation as per my artery pressure was 37.  The patient does not have any history of ischemic heart disease.  He had a left heart cardiac catheterization in May 2010 which showed no obstructive disease.  He had a treadmill Myoview in November 2012 but was able to exercise only 4 minutes stopped because of fatigue and was converted to a Lexa scan and showed no evidence for ischemia or infarction and his ejection fraction was 54%  Current Outpatient Prescriptions  Medication Sig Dispense Refill  . aspirin 81 MG tablet Take 81 mg by mouth daily.        Marland Kitchen atorvastatin (LIPITOR) 10 MG tablet TAKE ONE TABLET BY MOUTH ONCE DAILY  30 tablet  3  . levothyroxine (SYNTHROID, LEVOTHROID) 125 MCG tablet TAKE ONE TABLET BY MOUTH ONCE DAILY  30 tablet  1  . metoprolol succinate (TOPROL-XL) 25 MG 24 hr tablet Take 1 tablet (25 mg  total) by mouth 2 (two) times daily.  60 tablet  3  . sildenafil (VIAGRA) 100 MG tablet Take 1 tablet (100 mg total) by mouth daily as needed. Not to be taking with Nitroglycerin  30 tablet  0  . amoxicillin (AMOXIL) 500 MG capsule Take four capsules one hour prior to dental appoinment  20 capsule  0    Allergies  Allergen Reactions  . Plavix (Clopidogrel Bisulfate) Rash    Patient Active Problem List  Diagnosis  . HYPOTHYROIDISM  . MITRAL REGURGITATION  . HYPERTENSION  . ESOPHAGEAL STRICTURE  . GERD  . DIVERTICULOSIS, COLON  . MEMORY LOSS  . LOSS OF HEIGHT  . DYSPNEA/SHORTNESS OF BREATH  . DYSPHAGIA  . GROIN MASS, RIGHT  . SKIN CANCER, HX OF  . COLONIC POLYPS, HX OF  . SPINAL STENOSIS, LUMBAR  . TIA (transient ischemic attack)  . Tachycardia - pulse  . PVC's (premature ventricular contractions)  . Lymphocytosis  . Other and unspecified hyperlipidemia  . Overweight (BMI 25.0-29.9)    History  Smoking status  . Never Smoker   Smokeless tobacco  . Never Used    History  Alcohol Use No    Comment:  None as of 01/09/12    Family History  Problem Relation Age of Onset  . Heart attack Father 47  . Colon cancer Mother 29  .  Heart attack Maternal Grandmother 60  . Uterine cancer Paternal Grandmother   . Stroke Neg Hx   . Diabetes Neg Hx     Review of Systems: Constitutional: no fever chills diaphoresis or fatigue or change in weight.  Head and neck: no hearing loss, no epistaxis, no photophobia or visual disturbance. Respiratory: No cough, shortness of breath or wheezing. Cardiovascular: No chest pain peripheral edema, palpitations. Gastrointestinal: No abdominal distention, no abdominal pain, no change in bowel habits hematochezia or melena. Genitourinary: No dysuria, no frequency, no urgency, no nocturia. Musculoskeletal:No arthralgias, no back pain, no gait disturbance or myalgias. Neurological: No dizziness, no headaches, no numbness, no seizures, no syncope,  no weakness, no tremors. Hematologic: No lymphadenopathy, no easy bruising. Psychiatric: No confusion, no hallucinations, no sleep disturbance.    Physical Exam: Filed Vitals:   06/28/12 0919  BP: 101/60  Pulse: 35  Resp: 18   the general appearance reveals a well-developed well-nourished elderly gentleman in no distress.  He is able to lie flat without difficulty.  He is not aware of his PVCs.The head and neck exam reveals pupils equal and reactive.  Extraocular movements are full.  There is no scleral icterus.  The mouth and pharynx are normal.  The neck is supple.  The carotids reveal no bruits.  The jugular venous pressure is normal.  The  thyroid is not enlarged.  There is no lymphadenopathy.  The chest is clear to percussion and auscultation.  There are no rales or rhonchi.  Expansion of the chest reveals decreased breath sounds at the right base.  The precordium is quiet.  The first heart sound is normal.  The second heart sound is physiologically split.  There is no murmur gallop rub or click.  There is no abnormal lift or heave.  The abdomen is soft and nontender.  The bowel sounds are normal.  The liver and spleen are not enlarged.  There are no abdominal masses.  There are no abdominal bruits.  Extremities reveal good pedal pulses.  There is no phlebitis or edema.  There is no cyanosis or clubbing.  Strength is normal and symmetrical in all extremities.  There is no lateralizing weakness.  There are no sensory deficits.  The skin is warm and dry.  There is no rash.  EKG shows sinus bradycardia at 60 per minute with frequent PVCs and bigeminy.  There is a pattern of an old right bundle branch block which is unchanged  Assessment / Plan: The patient was reassured.  His apparent marked bradycardia noted earlier today was because of his PVCs not being accounted for. The patient should continue on his present dose of metoprolol succinate 25 mg daily.  He should also eliminate all caffeine  from his diet.  I have asked him to let us recheck him in about a month to see whether his PVCs have improved with elimination of caffeine.

## 2012-06-28 NOTE — Assessment & Plan Note (Signed)
EKG today shows normal sinus rhythm with frequent unifocal PVCs and runs of bigeminy.  The patient has been taking Toprol 25 mg daily.  He had not taken his medication yet today.  The patient gives a history of high caffeine intake.  He drinks 2 cups of coffee in the morning and has between 2-3 glasses of iced tea daily.  All of these beverages containing caffeine.  We have advised him to eliminate caffeine from his diet and see if this will help his PVCs.  He himself is unaware of the PVCs.

## 2012-06-29 NOTE — Telephone Encounter (Signed)
Number not accepting messages, will try again later

## 2012-06-29 NOTE — Telephone Encounter (Signed)
Still unable to accept messages

## 2012-07-03 NOTE — Telephone Encounter (Signed)
Left message to call back  

## 2012-07-05 ENCOUNTER — Ambulatory Visit: Payer: Medicare Other | Admitting: Physical Therapy

## 2012-07-10 ENCOUNTER — Ambulatory Visit: Payer: Medicare Other | Attending: Neurology | Admitting: Physical Therapy

## 2012-07-10 DIAGNOSIS — R269 Unspecified abnormalities of gait and mobility: Secondary | ICD-10-CM | POA: Insufficient documentation

## 2012-07-10 DIAGNOSIS — IMO0001 Reserved for inherently not codable concepts without codable children: Secondary | ICD-10-CM | POA: Insufficient documentation

## 2012-07-10 DIAGNOSIS — R42 Dizziness and giddiness: Secondary | ICD-10-CM | POA: Insufficient documentation

## 2012-07-12 ENCOUNTER — Ambulatory Visit: Payer: Medicare Other | Admitting: Physical Therapy

## 2012-07-13 ENCOUNTER — Telehealth: Payer: Self-pay | Admitting: *Deleted

## 2012-07-13 NOTE — Telephone Encounter (Signed)
F/U   Returning call back to nurse.   

## 2012-07-13 NOTE — Telephone Encounter (Signed)
Message copied by Burnell Blanks on Fri Jul 13, 2012  5:30 PM ------      Message from: Cassell Clement      Created: Thu Jun 28, 2012  5:42 PM       Please report.  His CBC shows no evidence of anemia.  Electrolytes are normal and his potassium is normal.  The PVCs may be secondary to his high intake of caffeine and he should avoid caffeine and see if PVCs resolve.  The reason for the low heart rate recorded at the physical therapy facility today was because his PVCs were not being recorded.

## 2012-07-13 NOTE — Telephone Encounter (Signed)
Discussed recent ov and labs with patient. Will just keep his ov later this month

## 2012-07-13 NOTE — Telephone Encounter (Signed)
Advised patient of lab results  

## 2012-07-19 ENCOUNTER — Ambulatory Visit: Payer: Medicare Other | Admitting: Physical Therapy

## 2012-07-24 ENCOUNTER — Ambulatory Visit: Payer: Medicare Other | Admitting: Physical Therapy

## 2012-07-26 ENCOUNTER — Ambulatory Visit: Payer: Medicare Other | Admitting: Physical Therapy

## 2012-08-02 ENCOUNTER — Ambulatory Visit: Payer: Medicare Other | Admitting: Cardiology

## 2012-08-02 ENCOUNTER — Other Ambulatory Visit (HOSPITAL_COMMUNITY): Payer: Self-pay | Admitting: Interventional Radiology

## 2012-08-08 ENCOUNTER — Ambulatory Visit (INDEPENDENT_AMBULATORY_CARE_PROVIDER_SITE_OTHER): Payer: Medicare Other | Admitting: Cardiology

## 2012-08-08 ENCOUNTER — Encounter: Payer: Self-pay | Admitting: Cardiology

## 2012-08-08 VITALS — BP 138/80 | HR 66 | Ht 71.0 in | Wt 206.0 lb

## 2012-08-08 DIAGNOSIS — I080 Rheumatic disorders of both mitral and aortic valves: Secondary | ICD-10-CM

## 2012-08-08 DIAGNOSIS — I4949 Other premature depolarization: Secondary | ICD-10-CM

## 2012-08-08 DIAGNOSIS — E7800 Pure hypercholesterolemia, unspecified: Secondary | ICD-10-CM

## 2012-08-08 DIAGNOSIS — I10 Essential (primary) hypertension: Secondary | ICD-10-CM

## 2012-08-08 DIAGNOSIS — Z9889 Other specified postprocedural states: Secondary | ICD-10-CM

## 2012-08-08 MED ORDER — AMOXICILLIN 500 MG PO TABS: ORAL_TABLET | ORAL | Status: DC

## 2012-08-08 NOTE — Assessment & Plan Note (Signed)
Since his last visit the patient has cut down on his caffeine.  He is not aware of any recent PVCs.  He is not having any dizziness or syncope.  He does have chronic mild vertigo symptoms.

## 2012-08-08 NOTE — Assessment & Plan Note (Signed)
The patient is not having any symptoms of congestive heart failure.  Physical exam reveals that the mitral valve continues to work well with no audible mitral regurgitation.

## 2012-08-08 NOTE — Assessment & Plan Note (Signed)
Blood pressure was remaining stable on current therapy 

## 2012-08-08 NOTE — Progress Notes (Signed)
Adrian Skeens Bozzi Jr. Date of Birth:  08/26/1927 [facility] [address] [place], [place]  [address] [phone]         Fax   [phone]  History of Present Illness: This 79 year old gentleman is seen for a followup office visit. Adrian West Kitchen He has a complicated past cardiac history. He has a history of mitral valve repair in 2010. 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 He has a history of chronic PVCs and chronic exertional dyspnea. He has had a thorough pulmonary workup both here in [place] with Dr. Marchelle Gearing as well as at [facility]. He is also followed closely by his PCP Dr. Alwyn Ren. His last echocardiogram was 04/04/11 which showed an ejection fraction of 55-60% with grade 1 diastolic dysfunction. He was status post mitral valve repair with minimal mitral stenosis and mild mitral regurgitation as per my artery pressure was 37. The patient does not have any history of ischemic heart disease. He had a left heart cardiac catheterization in May 2010 which showed no obstructive disease. He had a treadmill Myoview in November 2012 but was able to exercise only 4 minutes stopped because of fatigue and was converted to a Lexa scan and showed no evidence for ischemia or infarction and his ejection fraction was 54% .   Current Outpatient Prescriptions  Medication Sig Dispense Refill  . aspirin 81 MG tablet Take 81 mg by mouth daily.        Adrian West Kitchen atorvastatin (LIPITOR) 10 MG tablet TAKE ONE TABLET BY MOUTH ONCE DAILY  30 tablet  3  . levothyroxine (SYNTHROID, LEVOTHROID) 125 MCG tablet TAKE ONE TABLET BY MOUTH ONCE DAILY  30 tablet  1  . metoprolol succinate (TOPROL-XL) 25 MG 24 hr tablet Take 1 tablet (25 mg total) by mouth 2 (two) times daily.  60 tablet  3  . sildenafil (VIAGRA) 100 MG tablet Take 1 tablet (100 mg total) by mouth daily as needed. Not to be taking with Nitroglycerin  30 tablet  0  .  amoxicillin (AMOXIL) 500 MG tablet Take 4 tablets at once 1 hour prior to dental work due to valve repair       No current facility-administered medications for this visit.    Allergies  Allergen Reactions  . Plavix (Clopidogrel Bisulfate) Rash    Patient Active Problem List  Diagnosis  . HYPOTHYROIDISM  . MITRAL REGURGITATION  . HYPERTENSION  . ESOPHAGEAL STRICTURE  . GERD  . DIVERTICULOSIS, COLON  . MEMORY LOSS  . LOSS OF HEIGHT  . DYSPNEA/SHORTNESS OF BREATH  . DYSPHAGIA  . GROIN MASS, RIGHT  . SKIN CANCER, HX OF  . COLONIC POLYPS, HX OF  . SPINAL STENOSIS, LUMBAR  . TIA (transient ischemic attack)  . Tachycardia - pulse  . PVC's (premature ventricular contractions)  . Lymphocytosis  . Other and unspecified hyperlipidemia  . Overweight (BMI 25.0-29.9)  . Frequent unifocal PVCs    History  Smoking status  . Never Smoker   Smokeless tobacco  . Never Used    History  Alcohol Use No    Comment:  None as of 01/09/12    Family History  Problem Relation Age of Onset  . Heart attack Father 62  . Colon cancer Mother 68  . Heart attack Maternal Grandmother 60  . Uterine cancer Paternal Grandmother   . Stroke Neg Hx   . Diabetes Neg Hx     Review of Systems:  Constitutional: no fever chills diaphoresis or fatigue or change in weight.  Head and neck: no hearing loss, no epistaxis, no photophobia or visual disturbance. Respiratory: No cough, shortness of breath or wheezing. Cardiovascular: No chest pain peripheral edema, palpitations. Gastrointestinal: No abdominal distention, no abdominal pain, no change in bowel habits hematochezia or melena. Genitourinary: No dysuria, no frequency, no urgency, no nocturia. Musculoskeletal:No arthralgias, no back pain, no gait disturbance or myalgias. Neurological: No dizziness, no headaches, no numbness, no seizures, no syncope, no weakness, no tremors. Hematologic: No lymphadenopathy, no easy bruising. Psychiatric: No  confusion, no hallucinations, no sleep disturbance.    Physical Exam: Filed Vitals:   08/08/12 0856  BP: 138/80  Pulse: 66   the general exam reveals a well-developed alert gentleman in no distress.The head and neck exam reveals pupils equal and reactive.  Extraocular movements are full.  There is no scleral icterus.  The mouth and pharynx are normal.  The neck is supple.  The carotids reveal no bruits.  The jugular venous pressure is normal.  The  thyroid is not enlarged.  There is no lymphadenopathy.  The chest is clear to percussion and auscultation.  There are no rales or rhonchi.  Expansion of the chest is symmetrical.  There are diminished breath sounds on the right secondary to paralysis of the right hemidiaphragm.  The precordium is quiet.  The pulse is regular with occasional PVCs audible The first heart sound is normal.  The second heart sound is physiologically split.  There is no murmur gallop rub or click.  There is no abnormal lift or heave.  The abdomen is soft and nontender.  The bowel sounds are normal.  The liver and spleen are not enlarged.  There are no abdominal masses.  There are no abdominal bruits.  Extremities reveal good pedal pulses.  There is no phlebitis or edema.  There is no cyanosis or clubbing.  Strength is normal and symmetrical in all extremities.  There is no lateralizing weakness.  There are no sensory deficits.  The skin is warm and dry.  There is no rash.     Assessment / Plan: Continue same meds and recheck in 4 months for office visit and EKG.  Continue regular exercise and he plans to play golf twice a week when the weather improves.  During the winter he has been exercising at [facility] in the exercise class III times a week.

## 2012-08-08 NOTE — Patient Instructions (Addendum)
Your physician recommends that you continue on your current medications as directed. Please refer to the Current Medication list given to you today.  Your physician wants you to follow-up in: 4 months with ekg  You will receive a reminder letter in the mail two months in advance. If you don't receive a letter, please call our office to schedule the follow-up appointment.

## 2012-09-17 ENCOUNTER — Other Ambulatory Visit (HOSPITAL_COMMUNITY): Payer: Self-pay | Admitting: Urology

## 2012-09-17 ENCOUNTER — Ambulatory Visit (HOSPITAL_COMMUNITY)
Admission: RE | Admit: 2012-09-17 | Discharge: 2012-09-17 | Disposition: A | Discharge status: Home / Self Care | Payer: Medicare Other | Source: Ambulatory Visit | Attending: Urology | Admitting: Urology

## 2012-09-17 DIAGNOSIS — D4959 Neoplasm of unspecified behavior of other genitourinary organ: Secondary | ICD-10-CM

## 2012-09-17 DIAGNOSIS — Z85528 Personal history of other malignant neoplasm of kidney: Secondary | ICD-10-CM | POA: Insufficient documentation

## 2012-09-17 DIAGNOSIS — Z954 Presence of other heart-valve replacement: Secondary | ICD-10-CM | POA: Insufficient documentation

## 2012-09-26 ENCOUNTER — Ambulatory Visit
Admission: RE | Admit: 2012-09-26 | Discharge: 2012-09-26 | Disposition: A | Discharge status: Home / Self Care | Payer: Medicare Other | Source: Ambulatory Visit | Attending: Interventional Radiology | Admitting: Interventional Radiology

## 2012-09-26 ENCOUNTER — Ambulatory Visit (HOSPITAL_COMMUNITY): Payer: Medicare Other

## 2012-09-26 DIAGNOSIS — N2889 Other specified disorders of kidney and ureter: Secondary | ICD-10-CM

## 2012-09-26 NOTE — Progress Notes (Addendum)
Denies hematuria or any other problems w/ urination.  Denies pain or discomfort associated w/ cryoablation.  Exercise class (@ [facility]):  M, W & F.  Swim 2 x's/wk.  Golf: 2 x's/wk.  [place] Coupland Carmell Austria, Charity fundraiser

## 2012-09-26 NOTE — Progress Notes (Signed)
Patient ID: Adrian Quint., male   DOB: 03-Mar-1928, 79 y.o.   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  History: Adrian West is doing well. He remains active, performing exercise on a regular basis and playing golf 2 to 3 times a week. He states that he has no change in his baseline underlying chronic dyspnea.  Review of Systems: No fever or chills. No abdominal or flank pain. No hematuria or dysuria.  Exam: Vital signs: Blood pressure 162/74, pulse 63, respirations 20, temperature 98.2, oxygen saturation 96% on room air. General: No acute distress. Abdomen: Soft and nontender. No flank tenderness.  Labs: BUN 18, creatinine 1.09 and estimated GFR 63 ml/minute on 09/17/2012.  Imaging: Follow-up CT of the abdomen and pelvis on 09/18/2012 demonstrates stable left lower pole renal cryoablation defect without evidence of abnormal enhancement. No additional renal lesions are identified. No evidence of metastatic disease.  Assessment and Plan: No evidence of recurrent left renal neoplasm 3 years after percutaneous cryoablation. 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 Adrian West is scheduled to see Dr. Laverle Patter in follow-up this afternoon. I have recommended one more follow-up scan in 1 year. If there is no evidence of tumor recurrence at that time, routine follow up imaging could likely be discontinued. Adrian West is agreeable to have follow-up imaging performed next year.

## 2012-10-20 ENCOUNTER — Telehealth: Payer: Self-pay | Admitting: Physician Assistant

## 2012-10-20 NOTE — Telephone Encounter (Signed)
Pt's wife called answering service 5/17 concerned for her husband. He has reported severe headache, dizziness and inability to think. She says he's been sleeping all day and she is worried about stroke. I instructed her to call 911 immediately for further evaluation. She verbalized understanding and gratitude. Susana Gripp PA-C

## 2012-10-22 ENCOUNTER — Encounter: Payer: Self-pay | Admitting: Physician Assistant

## 2012-10-22 ENCOUNTER — Other Ambulatory Visit: Payer: Self-pay | Admitting: Internal Medicine

## 2012-10-22 ENCOUNTER — Ambulatory Visit (INDEPENDENT_AMBULATORY_CARE_PROVIDER_SITE_OTHER): Payer: Medicare Other | Admitting: Physician Assistant

## 2012-10-22 ENCOUNTER — Telehealth: Payer: Self-pay | Admitting: Cardiology

## 2012-10-22 VITALS — BP 141/91 | HR 54 | Ht 71.0 in | Wt 205.0 lb

## 2012-10-22 DIAGNOSIS — E039 Hypothyroidism, unspecified: Secondary | ICD-10-CM

## 2012-10-22 DIAGNOSIS — I951 Orthostatic hypotension: Secondary | ICD-10-CM | POA: Insufficient documentation

## 2012-10-22 DIAGNOSIS — I10 Essential (primary) hypertension: Secondary | ICD-10-CM

## 2012-10-22 DIAGNOSIS — G459 Transient cerebral ischemic attack, unspecified: Secondary | ICD-10-CM

## 2012-10-22 LAB — BASIC METABOLIC PANEL
CO2: 28 mEq/L (ref 19–32)
Calcium: 9.3 mg/dL (ref 8.4–10.5)
Chloride: 102 mEq/L (ref 96–112)
Sodium: 138 mEq/L (ref 135–145)

## 2012-10-22 MED ORDER — METOPROLOL SUCCINATE ER 25 MG PO TB24: 25.0000 mg | ORAL_TABLET | Freq: Every day | ORAL | Status: DC

## 2012-10-22 MED ORDER — CLOPIDOGREL BISULFATE 75 MG PO TABS: 75.0000 mg | ORAL_TABLET | Freq: Every day | ORAL | Status: DC

## 2012-10-22 NOTE — Assessment & Plan Note (Signed)
He is status post mitral valve repair in 2010 complicated by right hemidiaphragm paralysis. Last echo in 2012 with minimal mitral stenosis and mild mitral regurgitation EF 55-60% with grade 1 diastolic dysfunction.

## 2012-10-22 NOTE — Telephone Encounter (Signed)
New Problem:    Patient blacked out over the weekend and was checked out by the [facility] staff and was told he was ok.  Patient would like to be seen today by either Dr. Shirlee Latch or Dr. Patty Sermons for a check up.  Please call back.

## 2012-10-22 NOTE — Patient Instructions (Addendum)
**Note De-Identified  Obfuscation** Your physician has requested that you have a carotid duplex. This test is an ultrasound of the carotid arteries in your neck. It looks at blood flow through these arteries that supply the brain with blood. Allow one hour for this exam. There are no restrictions or special instructions.  Your provider recommends that you have a MRI of the brain, we will arrange  Your physician has recommended you make the following change in your medication: decrease Toprol to 25 mg once daily and start taking Plavix 75 mg daily  Your physician recommends that you return for lab work in: today   Your are being referred to Dr. Anne Hahn, a neurologist  Your physician recommends that you schedule a follow-up appointment in: 2 months  DO NOT DRIVE

## 2012-10-22 NOTE — Telephone Encounter (Signed)
Spoke with pt. Pt states he almost passed out Saturday. Pt was checked by [facility] staff . He is requesting appt to be checked today. I have scheduled him to see Jacolyn Reedy today at 10:30AM. I advised pt not to drive.

## 2012-10-22 NOTE — Assessment & Plan Note (Signed)
Patient suffered a probable TIA this past Saturday but did not go to the emergency room. His symptoms were his vision turned completely black but he did not lose consciousness. I discussed this patient detail with Dr.McLean recommend checking carotid Dopplers, MRI, treating him with Plavix 75 mg daily, and scheduling him for an appointment with Dr. Anne Hahn neurologist. He also feels the patient is at high risk for atrial fibrillation since his mitral valve repair. He feels he may have to have him monitor placed in the future to rule out atrial fibrillation. He is in sinus rhythm today. The patient also stop taking his medications for about 2 weeks including his aspirin.I had a long discussion with the patient concerning the risk of driving. I asked him not to drive until he is cleared by neurology. I advised him to call his wife to pick him up from the office today.

## 2012-10-22 NOTE — Progress Notes (Signed)
HPI:  This is a pleasant 79 year old white male patient of Dr. 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 He has a history of chronic PVCs and chronic exertional dyspnea. He has had thorough pulmonary workup here in [place] as well as [facility]. His last echo in 2012 showed an ejection fraction of 55-60% with grade 1 diastolic dysfunction. He is status post mitral valve repair with minimal mitral stenosis and mild mitral regurgitation. He does not have a history of ischemic heart disease. The left heart catheterization in May 2010 that showed no obstructive disease. He had a treadmill but Myoview in November 2012 but was able to only exercise 4 minutes and stopped due to fatigue. LEXA Scan showed no evidence of ischemia ejection fraction 54%.  On Saturday he was driving out of [facility] when everything turned black. He was able to speak to his wife and she drove him back to the house. His symptoms resolved in 30-60 minutes and then he fell asleep for about 5 hours. He said he had a slight headache before all this started and his only symptom were his vision went black. He denied any dizziness, palpitations, chest pain, dyspnea, syncope, or loss of bowel or bladder continence. He did not lose consciousness. He does admit to stopping his medications for about 2 weeks just because he forgot about them. He was off his aspirin. The nurse at [facility] checked him and told him his vitals were okay but her shin to go to the emergency room. The patient refused to go to the ER. He has had 2 similar episodes in the past 4 years both occurred while driving. He was diagnosed with a TIA and has been previously seen by Dr. Sandria Manly. He has not seen a neurologist is Dr. Sandria Manly he tired. The patient drove himself here today.  The patient remains quite active at [facility]. He golfs twice a week exercise 45 minutes per day 3 days a week  and also swims.  No Known Allergies  Current Outpatient Prescriptions on File Prior to Visit: amoxicillin (AMOXIL) 500 MG tablet, Take 4 tablets at once 1 hour prior to dental work due to valve repair, Disp: , Rfl:  aspirin 81 MG tablet, Take 81 mg by mouth daily.  , Disp: , Rfl:  atorvastatin (LIPITOR) 10 MG tablet, TAKE ONE TABLET BY MOUTH ONCE DAILY, Disp: 30 tablet, Rfl: 3 levothyroxine (SYNTHROID, LEVOTHROID) 125 MCG tablet, TAKE ONE TABLET BY MOUTH ONCE DAILY, Disp: 30 tablet, Rfl: 1 metoprolol succinate (TOPROL-XL) 25 MG 24 hr tablet, Take 1 tablet (25 mg total) by mouth 2 (two) times daily., Disp: 60 tablet, Rfl: 3 sildenafil (VIAGRA) 100 MG tablet, Take 1 tablet (100 mg total) by mouth daily as needed. Not to be taking with Nitroglycerin, Disp: 30 tablet, Rfl: 0  No current facility-administered medications on file prior to visit.   Past Medical History:   Thyroid disease                                              Hypertension  GERD (gastroesophageal reflux disease)                         Comment:PMH of stricture , recurrent;Dr Marina Goodell   Benign neoplasm of colon                                     Tremor, essential                                            Diverticulosis                                               TIA (transient ischemic attack)                 May 2012     Skin cancer                                                  Neuropathy, peripheral                                       Lung abnormality                                11/25/2008      Comment:"nerve damage from heart surgery"   Hyperlipidemia                                              Past Surgical History:   APPENDECTOMY                                                  CATARACT EXTRACTION                                             Comment:Dr Stoneburner   CHOLECYSTECTOMY                                  1998           Comment:Dr [name]    INGUINAL HERNIA REPAIR                                        LUMBAR LAMINECTOMY  NASAL SINUS SURGERY                                           TONSILLECTOMY                                                 ESOPHAGEAL DILATION                                             Comment: X3;Dr Perry   MITRAL VALVE REPLACEMENT                         11/25/2008     Comment:Dr Cornelius Moras; 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   LAPAROSCOPIC ABLATION RENAL MASS                 08/14/2009    MOHS SURGERY                                     2012           Comment: ear  Review of patient's family history indicates:   Heart attack                   Father                   Colon cancer                   Mother                   Heart attack                   Maternal Grandmother     Uterine cancer                 Paternal Grandmother     Stroke                         Neg Hx                   Diabetes                       Neg Hx                   Social History   Marital Status: Married             Spouse Name:                      Years of Education:                 Number of children:             Occupational History   None on file  Social History Main Topics   Smoking Status: Never Smoker  Smokeless Status: Never Used                       Alcohol Use: No                Comment:  None as of 01/09/12   Drug Use: No             Sexual Activity: Not on file        Other Topics            Concern   None on file  Social History Narrative   None on file    ZOX:WRUEAVW has chronic dyspnea on exertion.See history of present illness otherwise negative  PHYSICAL EXAM: Well-nournished, in no acute distress. Neck: No JVD, HJR, Bruit, or thyroid enlargement  Lungs: decreased breath sounds at the right base otherwiseNo tachypnea, clear without wheezing, rales, or rhonchi  Cardiovascular: RRR, PMI not displaced, heart  sounds normal, no murmurs, gallops, bruit, thrill, or heave.  Abdomen: BS normal. Soft without organomegaly, masses, lesions or tenderness.  Extremities: without cyanosis, clubbing or edema. Good distal pulses bilateral  SKin: Warm, no lesions or rashes   Musculoskeletal: No deformities  Neuro: no focal signs  BP 120/72  Pulse 53  Ht 5\' 11"  (1.803 m)  Wt 205 lb (92.987 kg)  BMI 28.6 kg/m2   UJW:JXBJY bradycardia at the cardia with first-degree AV block a right bundle branch block

## 2012-10-22 NOTE — Assessment & Plan Note (Signed)
The patient is orthostatic in the office today with blood pressure lying 135/66 dropping to 96/66 sitting. He did get a low dizzy. He says the symptoms are very different from what he had on Saturday when everything went black. I will decrease his Toprol to 25 mg once daily.

## 2012-10-23 ENCOUNTER — Encounter (INDEPENDENT_AMBULATORY_CARE_PROVIDER_SITE_OTHER): Payer: Medicare Other

## 2012-10-23 ENCOUNTER — Telehealth: Payer: Self-pay | Admitting: *Deleted

## 2012-10-23 DIAGNOSIS — G459 Transient cerebral ischemic attack, unspecified: Secondary | ICD-10-CM

## 2012-10-23 DIAGNOSIS — I6529 Occlusion and stenosis of unspecified carotid artery: Secondary | ICD-10-CM

## 2012-10-23 NOTE — Telephone Encounter (Signed)
Patient came in to office today wanting to change his Lipitor to Crestor. States his wife has heard a lot of bad things about Lipitor and wants him to switch, patient denies problems. Will forward to  Dr. Patty Sermons for review

## 2012-10-23 NOTE — Telephone Encounter (Signed)
Future order placed for TSH (Thyroid level)

## 2012-10-24 ENCOUNTER — Telehealth: Payer: Self-pay | Admitting: *Deleted

## 2012-10-24 ENCOUNTER — Other Ambulatory Visit: Payer: Self-pay | Admitting: Physician Assistant

## 2012-10-24 ENCOUNTER — Ambulatory Visit
Admission: RE | Admit: 2012-10-24 | Discharge: 2012-10-24 | Disposition: A | Discharge status: Home / Self Care | Payer: Medicare Other | Source: Ambulatory Visit | Attending: Physician Assistant | Admitting: Physician Assistant

## 2012-10-24 DIAGNOSIS — G459 Transient cerebral ischemic attack, unspecified: Secondary | ICD-10-CM

## 2012-10-24 NOTE — Telephone Encounter (Signed)
Left message - labs ok

## 2012-10-24 NOTE — Telephone Encounter (Signed)
Message copied by Tarri Fuller on Wed Oct 24, 2012  6:04 PM ------      Message from: Prescott Gum      Created: Wed Oct 24, 2012  3:01 PM       Carotids ok. F/u 1 yr ------

## 2012-10-24 NOTE — Telephone Encounter (Signed)
pt notified about carotids and will repeat in 1 yr, pt verbalized understanding

## 2012-10-24 NOTE — Telephone Encounter (Signed)
Message copied by Burnell Blanks on Wed Oct 24, 2012 10:36 AM ------      Message from: Prescott Gum      Created: Wed Oct 24, 2012 10:02 AM       Labs ok ------

## 2012-10-25 ENCOUNTER — Telehealth: Payer: Self-pay | Admitting: Neurology

## 2012-10-25 NOTE — Telephone Encounter (Signed)
I called and left a message for the patient that Dr. Anne Hahn has a opening today at 4:15, if he would like to have this appt. to callback to office to speak with a scheduler.

## 2012-10-27 ENCOUNTER — Other Ambulatory Visit: Payer: Medicare Other

## 2012-10-28 NOTE — Telephone Encounter (Signed)
There are no LFTs or lipids in Epic since 2012. I would like him to get some fasting LP, HFP and BMET then we can consider switch to low dose Crestor.

## 2012-10-30 NOTE — Telephone Encounter (Signed)
Left message to call back  

## 2012-10-31 ENCOUNTER — Institutional Professional Consult (permissible substitution): Payer: Self-pay | Admitting: Neurology

## 2012-11-14 ENCOUNTER — Encounter: Payer: Self-pay | Admitting: Internal Medicine

## 2012-11-14 ENCOUNTER — Ambulatory Visit (INDEPENDENT_AMBULATORY_CARE_PROVIDER_SITE_OTHER): Payer: Medicare Other | Admitting: Internal Medicine

## 2012-11-14 VITALS — BP 118/80 | HR 42 | Resp 25 | Wt 204.0 lb

## 2012-11-14 DIAGNOSIS — R0609 Other forms of dyspnea: Secondary | ICD-10-CM

## 2012-11-14 DIAGNOSIS — R0989 Other specified symptoms and signs involving the circulatory and respiratory systems: Secondary | ICD-10-CM

## 2012-11-14 MED ORDER — MECLIZINE HCL 12.5 MG PO TABS: 12.5000 mg | ORAL_TABLET | Freq: Three times a day (TID) | ORAL | Status: DC | PRN

## 2012-11-14 NOTE — Patient Instructions (Addendum)
Good to see you . You have presented me with a challenge in regard to multiple symtoms and problems as well as a wealth of data to review: imaging studies, labs, consult notes.  For now: in regard to "vertigo" which sounds more like dysequilibrium (the rocking boat at sea) will rechallenge you with meclizine 12.5 mg to be taken on schedule 3 times a day. Be watchful for sedation.  For dyspnea - a trial of oxygen will be helpful to see if there is relief from dyspnea with walking. I will work on finding a way to try this out.  You should continue to see a neurologist to help with these issues. I am particularly interested to have their opinion as to whether a cervical myelopathy could contribute to your symptoms. If they agree it may require a repeat MRI of the neck.  Change in consciousness - not sure what that was but it may not have been a small stroke (TIA). For safety please continue the plavix.  No changes in any of your medications. I will be reviewing all your records (may take a couple of days to find the time) and will get back with you as to our next steps.

## 2012-11-14 NOTE — Progress Notes (Signed)
Subjective:    Patient ID: Adrian Quint., male    DOB: [birth year], 79 y.o.   MRN: [id number]  HPI Mr Tuite presents to establish for on-going care in transfer from Dr. Alwyn Ren.  Primary concern is increased DOE. He has a paralyzed diaphragm post -op which causes significant air hunger. He has been seen back at [facility] and by pulmonary. He has adequate oxygenation when seen. His life is disrupted by his symptoms.  He is otherwise doing well.  Past Medical History  Diagnosis Date  . Thyroid disease   . Hypertension   . GERD (gastroesophageal reflux disease)     PMH of stricture , recurrent;Dr Marina Goodell  . Benign neoplasm of colon   . Tremor, essential   . Diverticulosis   . TIA (transient ischemic attack) May 2012  . Skin cancer   . Neuropathy, peripheral   . Lung abnormality 11/25/2008    "nerve damage from heart surgery"  . Hyperlipidemia    Past Surgical History  Procedure Laterality Date  . Appendectomy    . Cataract extraction      Dr Dagoberto Ligas  . Cholecystectomy  1998    Dr [name]  . Inguinal hernia repair    . Lumbar laminectomy    . Nasal sinus surgery    . Tonsillectomy    . Esophageal dilation       X3;Dr Marina Goodell  . 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 Laparoscopic ablation renal mass  08/14/2009  . Mohs surgery  2012     ear   Family History  Problem Relation Age of Onset  . Heart attack Father 59  . Colon cancer Mother 44  . Heart attack Maternal Grandmother 60  . Uterine cancer Paternal Grandmother   . Stroke Neg Hx   . Diabetes Neg Hx    History   Social History  . Marital Status: Married    Spouse Name: N/A    Number of Children: N/A  . Years of Education: N/A   Occupational History  . Not on file.   Social History Main Topics  . Smoking status: Never Smoker   . Smokeless tobacco: Never Used  . Alcohol Use: No     Comment:  None as of 01/09/12  . Drug Use: No  . Sexually  Active: Not on file   Other Topics Concern  . Not on file   Social History Narrative  . No narrative on file    Current Outpatient Prescriptions on File Prior to Visit  Medication Sig Dispense Refill  . amoxicillin (AMOXIL) 500 MG tablet Take 4 tablets at once 1 hour prior to dental work due to valve repair      . aspirin 81 MG tablet Take 81 mg by mouth daily.        Marland Kitchen atorvastatin (LIPITOR) 10 MG tablet TAKE ONE TABLET BY MOUTH ONCE DAILY  30 tablet  3  . clopidogrel (PLAVIX) 75 MG tablet Take 1 tablet (75 mg total) by mouth daily.  30 tablet  3  . metoprolol succinate (TOPROL-XL) 25 MG 24 hr tablet Take 1 tablet (25 mg total) by mouth daily.  30 tablet  3  . sildenafil (VIAGRA) 100 MG tablet Take 1 tablet (100 mg total) by mouth daily as needed. Not to be taking with Nitroglycerin  30 tablet  0   No current facility-administered medications on file prior to visit.  Review of Systems System review is negative for any constitutional, cardiac, pulmonary, GI or neuro symptoms or complaints other than as described in the HPI.     Objective:   Physical Exam Filed Vitals:   11/14/12 1548  BP: 118/80  Pulse: 42  Resp: 25   gen'l WNWD white man in no distress COr - RRR PUlm - no increased WOB Neuro - A&O       Assessment & Plan:

## 2012-11-15 ENCOUNTER — Other Ambulatory Visit: Payer: Self-pay

## 2012-11-15 ENCOUNTER — Other Ambulatory Visit: Payer: Self-pay | Admitting: Internal Medicine

## 2012-11-15 DIAGNOSIS — E039 Hypothyroidism, unspecified: Secondary | ICD-10-CM

## 2012-11-15 MED ORDER — LEVOTHYROXINE SODIUM 125 MCG PO TABS: ORAL_TABLET | ORAL | Status: DC

## 2012-11-15 NOTE — Telephone Encounter (Signed)
Left message on VM for patient to return call when available, reason for call (patient needs to confirm who is PCP is [facility] or [facility])

## 2012-11-15 NOTE — Telephone Encounter (Signed)
Patient calling back and states that his PCP is Dr. Debby Bud. He says that this change was made 6 months ago.

## 2012-11-19 ENCOUNTER — Telehealth: Payer: Self-pay

## 2012-11-19 NOTE — Telephone Encounter (Signed)
Phone call from patient stating he saw you [place] last Wednesday afternoon. He had many health issues you went over and wrote a written report and gave it to him. You were needing to research some things and get back to him. He needs to know if he should wait for your report, see you?? Please advise.

## 2012-11-19 NOTE — Telephone Encounter (Signed)
Phon call from [place] with [facility] [phone]. She states she received a phone call from you regarding patient needing portability upon exertion. She needs qualifying sat. Please advise.

## 2012-11-19 NOTE — Telephone Encounter (Signed)
Still working on issues we discussed. He will please wait until I get back to him. Thanks

## 2012-11-19 NOTE — Telephone Encounter (Signed)
The patient did not have a qualifying oxygen saturation - sats were good. It is the dyspnea and increased work of breathing due to paralyzed right hemi-diaphragm. I am interested in him having a trial of portable oxygen to relieve his dyspnea even with adequate oxygen saturation.   If they cannot help I will understand but would like advice as to how I can do such a trial.

## 2012-11-20 NOTE — Telephone Encounter (Signed)
I spoke with Synetta Fail and she says without qualifying oxygen saturation patient's insurance will not cover this. Patient will need to pay out of pocket and price depends on what size tank is needed.

## 2012-11-21 NOTE — Telephone Encounter (Signed)
Patients calls stating he is not in [place], [place] Phone # [phone]. Address is [address] [place], [place] [address]

## 2012-11-22 NOTE — Telephone Encounter (Signed)
Please call Adrian West: unable to get insurance coverage for oxygen. We can order a trial of portable oxygen for symptomatic relief but it will be private pay.... Can cost upwards of $300/month.  Thanks

## 2012-11-22 NOTE — Telephone Encounter (Signed)
Left message for patient to call back  

## 2012-11-26 ENCOUNTER — Telehealth: Payer: Self-pay

## 2012-11-26 NOTE — Telephone Encounter (Signed)
Order written - on your desk to fax. Thanks

## 2012-11-26 NOTE — Telephone Encounter (Signed)
Patient states he has 80% insurance coverage. He spoke with Ezzard Standing at [facility] [phone] or [phone]) f [phone]. She needs order and test information and then will be able to order patient's oxygen.

## 2012-11-26 NOTE — Telephone Encounter (Signed)
Written order faxed to [facility]

## 2012-11-26 NOTE — Telephone Encounter (Signed)
Crystal with [employer] called needing a qualifying O2 sat. Do we have one to forward?? I talked to patient today and he states he has 80% ins. coverage

## 2012-11-26 NOTE — Telephone Encounter (Signed)
This is a circular discussion - he does NOT have a qualifying oxygen saturation but if he is will to private pay for a non-qualifying use of oxygen under medicare we can try him on portable oxygen when walking or being active to see if this relieves his dyspnea. If this is still not clear I will talk with the [employer] representative.   Thanks. This is a bit tough since it is unusual - "out of the box."

## 2012-11-27 ENCOUNTER — Telehealth: Payer: Self-pay | Admitting: Internal Medicine

## 2012-11-27 ENCOUNTER — Ambulatory Visit (INDEPENDENT_AMBULATORY_CARE_PROVIDER_SITE_OTHER): Payer: Medicare Other | Admitting: Internal Medicine

## 2012-11-27 ENCOUNTER — Encounter: Payer: Self-pay | Admitting: Internal Medicine

## 2012-11-27 VITALS — BP 130/80 | HR 56 | Temp 96.8°F | Resp 16 | Wt 202.0 lb

## 2012-11-27 DIAGNOSIS — R0609 Other forms of dyspnea: Secondary | ICD-10-CM

## 2012-11-27 DIAGNOSIS — R0989 Other specified symptoms and signs involving the circulatory and respiratory systems: Secondary | ICD-10-CM

## 2012-11-27 NOTE — Telephone Encounter (Signed)
k

## 2012-11-27 NOTE — Telephone Encounter (Signed)
Crystal notified. No questions/concerns at this time.

## 2012-11-27 NOTE — Telephone Encounter (Signed)
Appt today at [phone] 

## 2012-11-27 NOTE — Telephone Encounter (Signed)
[place] to [place] this morning.  His breathing has gotten much worse.  He wants to be worked in late today.

## 2012-12-03 ENCOUNTER — Telehealth: Payer: Self-pay

## 2012-12-03 NOTE — Telephone Encounter (Signed)
Phone call from [place] at [facility] [phone]. She said she talked to patient about portable oxygen and he states he's going to be using it at night. Well Crystal states that only lasts 5 hours. She would like to know will he be using it at night and if so would you rather him have an overnight oxymetry?? Or is patient getting confused?

## 2012-12-05 NOTE — Telephone Encounter (Signed)
Spoke with patient - he is feeling better.  OK to order overnight oximetry.

## 2012-12-06 NOTE — Telephone Encounter (Signed)
Order has been faxed to Crystal at [phone].

## 2012-12-06 NOTE — Telephone Encounter (Signed)
Crystal notified that Dr Debby Bud spoke to the patient and okay for overnight oximetry. She needs the order faxed to [phone]. Waiting for Dr Debby Bud to sign order.

## 2012-12-11 ENCOUNTER — Telehealth: Payer: Self-pay

## 2012-12-11 NOTE — Telephone Encounter (Signed)
Phone call from [place] with [employer] in [place] [phone]. She states patient had his overnight oximetry done (results to be faxed) and he qualifies per insurance guidelines. Crystal just wants to clarify to give 2 liters oxygen per order. Please advise. thanks

## 2012-12-11 NOTE — Telephone Encounter (Signed)
Interesting that he qualifies. OK for O2 at 2 l [place] with humidification at night, during the day prn,

## 2012-12-11 NOTE — Telephone Encounter (Signed)
Crystal notified.

## 2012-12-24 ENCOUNTER — Encounter: Payer: Self-pay | Admitting: Internal Medicine

## 2012-12-26 ENCOUNTER — Ambulatory Visit: Payer: Medicare Other | Admitting: Cardiology

## 2012-12-28 ENCOUNTER — Telehealth: Payer: Self-pay | Admitting: Neurology

## 2013-01-13 NOTE — Progress Notes (Signed)
  Subjective:    Patient ID: Adrian Quint., male    DOB: [birth year], 79 y.o.   MRN: [id number]  HPI Adrian West presents to discuss logistics of home oxygen. This has been a bit difficult to arrange. His complaint is unchanged     Review of Systems     Objective:   Physical Exam Filed Vitals:   11/27/12 1658  BP: 130/80  Pulse: 56  Temp: 96.8 F (36 C)  Resp: 16   gen'l WNWD Pulm - no increased WOB       Assessment & Plan:

## 2013-01-13 NOTE — Assessment & Plan Note (Signed)
Will work with home health provider in [place], [place].  Addendum: patient did have overnight oximetry which reveals hypoxemia and he does qualify for home O2 nocturnal use and prn daytime use

## 2013-01-13 NOTE — Assessment & Plan Note (Signed)
On-going problem that is related to post-op diaphragm issues and underlying pulmonary issues.  PLan  Trial of home oxygen  If no relief with oxygen will consider MS at low dose to relieve his dyspnea.

## 2013-01-14 ENCOUNTER — Ambulatory Visit (INDEPENDENT_AMBULATORY_CARE_PROVIDER_SITE_OTHER): Payer: Medicare Other | Admitting: Internal Medicine

## 2013-01-14 ENCOUNTER — Encounter: Payer: Self-pay | Admitting: Internal Medicine

## 2013-01-14 VITALS — BP 148/90 | HR 56 | Temp 97.4°F | Wt 204.4 lb

## 2013-01-14 DIAGNOSIS — R0989 Other specified symptoms and signs involving the circulatory and respiratory systems: Secondary | ICD-10-CM

## 2013-01-14 DIAGNOSIS — R0609 Other forms of dyspnea: Secondary | ICD-10-CM

## 2013-01-14 DIAGNOSIS — M25551 Pain in right hip: Secondary | ICD-10-CM

## 2013-01-14 DIAGNOSIS — M25559 Pain in unspecified hip: Secondary | ICD-10-CM

## 2013-01-14 MED ORDER — MORPHINE SULFATE ER 10 MG PO CP24: 10.0000 mg | ORAL_CAPSULE | Freq: Every day | ORAL | Status: DC

## 2013-01-14 NOTE — Patient Instructions (Addendum)
Thanks for working with me Adrian West) today!  Hip pain: I'm glad to hear your hip pain has gotten better.   Plan: If it doesn't come back, don't worry about it.  If it does come back, try taking Aleve tablets (two tablets at a time, twice per day).  Dizziness: Meclizine is not helping.  Plan: stop taking meclizine.   Dyspnea:   Plan: We will give you Kadian.  Take 1 10 mg capsule of Kadian at bed time every night.  It may be sedating, so we want any sedation that occurs to  happen at bedtime.  We will prescribe 7 capsules.  If they help, we will prescribe more.

## 2013-01-14 NOTE — Progress Notes (Signed)
Subjective:     Patient ID: Adrian Quint., male   DOB: [birth year], 79 y.o.   MRN: [id number]  HPI Comments: Adrian West is an 79 year-old gentleman who presents today with an acute complaint of hip pain in his right hip.  Past medical history of spinal stenosis of lumbar spine.  Successfully operated on 20 years ago.  Hip Pain  The incident occurred 3 to 5 days ago. Incident location: Pain started after sitting in uncomfortable chair. There was no injury mechanism. The pain is present in the right hip. The quality of the pain is described as stabbing. The pain is at a severity of 8/10 (It was 8/10 at its worst.  It has since resolved.). The pain is severe (No pain today.). The pain has been fluctuating since onset. Pertinent negatives include no inability to bear weight, loss of motion, loss of sensation, muscle weakness, numbness or tingling. Associated symptoms comments: Could barely walk.  Had to stop every four to five steps.Marland Kitchen He reports no foreign bodies present. He has tried NSAIDs, heat and non-weight bearing for the symptoms. The treatment provided no relief.     Review of Systems  Neurological: Negative for tingling, weakness and numbness.       Objective:   Physical Exam  Constitutional: He appears well-developed and well-nourished.   Filed Vitals:   01/14/13 1718  BP: 148/90  Pulse: 56  Temp: 97.4 F (36.3 C)   Gen'l - WNWD elderly white man in no distress Pulm - rapid shallow breaths with purse lip breathing Neuro His gait was slightly slow and shuffling.  Tandem gait was choppy.   MSK  Normal internal/external rotation, normal adduction, no pain with pressure against the greater trochanter or with AP pressure.Pain was unable to be recreated with pressure to the glutes.    Assessment and Plan:     1 Hip Pain, right: The hip pain has resolved since it started three days ago.  If it returns, Adrian West was counseled to take two 220 mg naproxen (Aleve) twice a day.  He  should follow up if the pain returns and does not improve with naproxen or if it returns and persists for more than a week.  2 Dizziness: Meclizine does not appear to be successfully treating Adrian West vertigo.  He should discontinue its use.  3 Dyspnea: Adrian West was prescribed extended-release, low dose morphine (Kadain) (10 mg daily) to try to assist in his dyspnea.  He was prescribed seven tablets for the purpose.  He should follow up via MyChart and we will assess the efficacy.  If this is helpful, we will renew the prescription.

## 2013-01-16 NOTE — Assessment & Plan Note (Signed)
Continued activity limiting dyspnea despite use of oxygen.  Plan Trial of low dose morphine for it's salutory effect on pulmonary receptors and hopefully reduction of dyspnes - Kadian 10 mg once a day.

## 2013-01-22 ENCOUNTER — Other Ambulatory Visit: Payer: Self-pay | Admitting: Internal Medicine

## 2013-01-28 ENCOUNTER — Telehealth: Payer: Self-pay

## 2013-01-28 NOTE — Telephone Encounter (Signed)
Phone call from patient [phone] wanting to know if you think it would be a good idea for him and his wife to go to the [facility] in [place] since they are considered the best respiratory center in the country? Please advise. Thanks.

## 2013-01-28 NOTE — Telephone Encounter (Signed)
I have heard of their reputation and if I can assist in any way with a referral let me know. Check their web site to see what they prefer in regard to new patients.

## 2013-01-28 NOTE — Telephone Encounter (Signed)
Patient notified of message via voicemail and to call back with any questions.

## 2013-01-29 ENCOUNTER — Telehealth: Payer: Self-pay | Admitting: Internal Medicine

## 2013-01-29 NOTE — Telephone Encounter (Signed)
Left message for pt to call back  °

## 2013-01-29 NOTE — Telephone Encounter (Signed)
Pt is calling requesting to have his throat stretched. Pt had EGD with dil done 06/21/11. Would you want this pt seen for an OV first? Please advise.

## 2013-01-29 NOTE — Telephone Encounter (Signed)
Dr. Marina Goodell can he be scheduled into a follow-up slot?

## 2013-01-29 NOTE — Telephone Encounter (Signed)
Needs OV first!!

## 2013-01-29 NOTE — Telephone Encounter (Signed)
Yes. Thanks 

## 2013-02-05 NOTE — Telephone Encounter (Signed)
Left message for pt to call back regarding appt. 

## 2013-02-05 NOTE — Telephone Encounter (Signed)
Unable to get in touch with the patient. Mailed letter to pt regarding appt.

## 2013-02-09 ENCOUNTER — Emergency Department (HOSPITAL_COMMUNITY)
Admission: EM | Admit: 2013-02-09 | Discharge: 2013-02-09 | Disposition: A | Discharge status: Home / Self Care | Payer: Medicare Other | Attending: Emergency Medicine | Admitting: Emergency Medicine

## 2013-02-09 ENCOUNTER — Emergency Department (HOSPITAL_COMMUNITY): Payer: Medicare Other

## 2013-02-09 ENCOUNTER — Encounter (HOSPITAL_COMMUNITY): Payer: Self-pay | Admitting: Emergency Medicine

## 2013-02-09 DIAGNOSIS — Z8673 Personal history of transient ischemic attack (TIA), and cerebral infarction without residual deficits: Secondary | ICD-10-CM | POA: Insufficient documentation

## 2013-02-09 DIAGNOSIS — Z7902 Long term (current) use of antithrombotics/antiplatelets: Secondary | ICD-10-CM | POA: Insufficient documentation

## 2013-02-09 DIAGNOSIS — Z79899 Other long term (current) drug therapy: Secondary | ICD-10-CM | POA: Insufficient documentation

## 2013-02-09 DIAGNOSIS — E785 Hyperlipidemia, unspecified: Secondary | ICD-10-CM | POA: Insufficient documentation

## 2013-02-09 DIAGNOSIS — M19012 Primary osteoarthritis, left shoulder: Secondary | ICD-10-CM

## 2013-02-09 DIAGNOSIS — Z86011 Personal history of benign neoplasm of the brain: Secondary | ICD-10-CM | POA: Insufficient documentation

## 2013-02-09 DIAGNOSIS — Z85828 Personal history of other malignant neoplasm of skin: Secondary | ICD-10-CM | POA: Insufficient documentation

## 2013-02-09 DIAGNOSIS — Z8719 Personal history of other diseases of the digestive system: Secondary | ICD-10-CM | POA: Insufficient documentation

## 2013-02-09 DIAGNOSIS — Z7982 Long term (current) use of aspirin: Secondary | ICD-10-CM | POA: Insufficient documentation

## 2013-02-09 DIAGNOSIS — R0602 Shortness of breath: Secondary | ICD-10-CM | POA: Insufficient documentation

## 2013-02-09 DIAGNOSIS — M25519 Pain in unspecified shoulder: Secondary | ICD-10-CM | POA: Insufficient documentation

## 2013-02-09 DIAGNOSIS — I10 Essential (primary) hypertension: Secondary | ICD-10-CM | POA: Insufficient documentation

## 2013-02-09 DIAGNOSIS — M25512 Pain in left shoulder: Secondary | ICD-10-CM

## 2013-02-09 DIAGNOSIS — R42 Dizziness and giddiness: Secondary | ICD-10-CM | POA: Insufficient documentation

## 2013-02-09 DIAGNOSIS — E079 Disorder of thyroid, unspecified: Secondary | ICD-10-CM | POA: Insufficient documentation

## 2013-02-09 DIAGNOSIS — M19019 Primary osteoarthritis, unspecified shoulder: Secondary | ICD-10-CM | POA: Insufficient documentation

## 2013-02-09 MED ORDER — AZITHROMYCIN 250 MG PO TABS: 250.0000 mg | ORAL_TABLET | Freq: Every day | ORAL | Status: DC

## 2013-02-09 NOTE — ED Provider Notes (Signed)
CSN: [id number]     Arrival date & time 02/09/13  0946 History   First MD Initiated Contact with Patient 02/09/13 1000     Chief Complaint  Patient presents with  . left shoulder pain    (Consider location/radiation/quality/duration/timing/severity/associated sxs/prior Treatment) HPI Comments: Patient presents with intermittent sharp pain in left shoulder with radiation into left elbow.  States the pain lasts for 30-60 seconds at a time, starts, increases in intensity and then completely goes aqway.  Occurred yesterday at 2pm, then every 20 minutes from 10pm-2am, then again once this morning.  Pt is pain free at present.  Pt exercises in the pool regularly but denies any increased activity that might have hurt his shoulder.  NO heavy lifting, no falls.  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  Denies weakness or numbness of the arm.  [facility] and was sent to ED for further evaluation.   The history is provided by the patient and the spouse.    Past Medical History  Diagnosis Date  . Thyroid disease   . Hypertension   . GERD (gastroesophageal reflux disease)     PMH of stricture , recurrent;Dr Marina Goodell  . Benign neoplasm of colon   . Tremor, essential   . Diverticulosis   . TIA (transient ischemic attack) May 2012  . Skin cancer   . Neuropathy, peripheral   . Lung abnormality 11/25/2008    "nerve damage from heart surgery"  . Hyperlipidemia    Past Surgical History  Procedure Laterality Date  . Appendectomy    . Cataract extraction      Dr Dagoberto Ligas  . Cholecystectomy  1998    Dr [name]  . Inguinal hernia repair    . Lumbar laminectomy    . Nasal sinus surgery    . Tonsillectomy    . Esophageal dilation       X3;Dr Marina Goodell  . 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 Laparoscopic ablation renal mass  08/14/2009   . Mohs surgery  2012     ear   Family History  Problem Relation Age of Onset  . Heart attack Father 4  . Colon cancer Mother 57  . Heart attack Maternal Grandmother 60  . Uterine cancer Paternal Grandmother   . Stroke Neg Hx   . Diabetes Neg Hx    History  Substance Use Topics  . Smoking status: Never Smoker   . Smokeless tobacco: Never Used  . Alcohol Use: No     Comment:  None as of 01/09/12    Review of Systems  Constitutional: Negative for fever and chills.  HENT: Negative for sore throat and neck pain.   Respiratory: Positive for shortness of breath (chronic, unchanged). Negative for cough.   Cardiovascular: Negative for chest pain.  Gastrointestinal: Negative for nausea, vomiting and abdominal pain.  Musculoskeletal: Negative for back pain.  Neurological: Positive for dizziness (chronic vertigo, unchanged). Negative for weakness, light-headedness and numbness.  All other systems reviewed and are negative.    Allergies  Review of patient's allergies indicates no known allergies.  Home Medications   Current Outpatient Rx  Name  Route  Sig  Dispense  Refill  . amoxicillin (AMOXIL) 500 MG tablet      Take 4 tablets at once 1 hour prior to dental work due to valve repair         .  aspirin 81 MG tablet   Oral   Take 81 mg by mouth daily.           Marland Kitchen atorvastatin (LIPITOR) 10 MG tablet      TAKE ONE TABLET BY MOUTH ONCE DAILY   30 tablet   3     Patient needs to set up follow up appointment   . clopidogrel (PLAVIX) 75 MG tablet   Oral   Take 1 tablet (75 mg total) by mouth daily.   30 tablet   3   . levothyroxine (SYNTHROID, LEVOTHROID) 125 MCG tablet      TAKE ONE TABLET EVERY DAY   30 tablet   5   . meclizine (ANTIVERT) 12.5 MG tablet   Oral   Take 1 tablet (12.5 mg total) by mouth 3 (three) times daily as needed.   90 tablet   3   . metoprolol succinate (TOPROL-XL) 25 MG 24 hr tablet   Oral   Take 1 tablet (25 mg total) by mouth  daily.   30 tablet   3     Decrease in dose   . morphine (KADIAN) 10 MG 24 hr capsule   Oral   Take 1 capsule (10 mg total) by mouth daily. For chronic dyspnea due to paralyzed hemidiaphragm   7 capsule   0   . sildenafil (VIAGRA) 100 MG tablet   Oral   Take 1 tablet (100 mg total) by mouth daily as needed. Not to be taking with Nitroglycerin   30 tablet   0    BP 115/68  Pulse 72  Temp(Src) 97.6 F (36.4 C) (Oral)  Resp 18  SpO2 97% Physical Exam  Nursing note and vitals reviewed. Constitutional: He appears well-developed and well-nourished. No distress.  HENT:  Head: Normocephalic and atraumatic.  Neck: Neck supple.  Pulmonary/Chest: Effort normal. No respiratory distress. He has decreased breath sounds in the right upper field, the right middle field and the right lower field. He has no wheezes. He has no rhonchi. He has no rales. He exhibits no tenderness.  Musculoskeletal:       Left shoulder: Normal.  Upper extremities:  Strength 5/5, sensation intact, distal pulses intact.    Spine nontender, no crepitus, or steopoffs.   Neurological: He is alert.  Skin: He is not diaphoretic.    ED Course  Procedures (including critical care time) Labs Review Labs Reviewed - No data to display Imaging Review Dg Chest 2 View  02/09/2013   *RADIOLOGY REPORT*  Clinical Data: Left-sided shoulder pain.  CHEST - 2 VIEW  Comparison: Chest x-ray 09/17/2012.  Findings: Lung volumes are low.  Linear bibasilar opacities favored to reflect subsegmental atelectasis. Potential patchy air space consolidation in the left lower lobe is also noted, which could represent developing infection or sequelae of aspiration.  No evidence of pulmonary edema.  Heart size is normal.  No pneumothorax.  Mediastinal contours are unremarkable. Atherosclerosis of the thoracic aorta.  Status post mitral annuloplasty.  IMPRESSION: 1.  Low lung volumes with bibasilar subsegmental atelectasis. Potential early airspace  consolidation in the left lower lobe may represent early bronchopneumonia or sequelae of aspiration. 2.  Atherosclerosis.   Original Report Authenticated By: Trudie Reed, M.D.   Dg Shoulder Left  02/09/2013   *RADIOLOGY REPORT*  Clinical Data: Left-sided shoulder pain.  LEFT SHOULDER - 2+ VIEW  Comparison: No priors.  Findings: Three views of the left shoulder demonstrate no acute displaced fracture, subluxation, dislocation, joint or soft tissue  abnormality.  However, there is joint space narrowing, subchondral sclerosis, subchondral cyst formation and osteophyte formation in the glenohumeral joint, compatible with advanced osteoarthritis.  IMPRESSION: 1.  No acute radiographic abnormality of the left shoulder. 2.  Advanced osteoarthritis of the left glenohumeral joint.   Original Report Authenticated By: Trudie Reed, M.D.    10:21 AM Discussed pt with Dr Blinda Leatherwood who will also see the patient.  11:44 AM Discussed chest xray results with Dr Blinda Leatherwood.  Plan for d/c home with PCP follow up with z-pak for coverage given patient's chronic SOB, despite pt not having any symptoms concerning for pneumonia.  Pt to have repeat xray in 2 weeks with PCP.  Pt informed of results and plan.  Pt verbalizes understanding and agrees with plan.    Date: 02/09/2013  Rate: 62  Rhythm: normal sinus rhythm  QRS Axis: normal  Intervals: normal  ST/T Wave abnormalities: nonspecific ST/T changes  Conduction Disutrbances:first-degree A-V block  and right bundle branch block  Narrative Interpretation:   Old EKG Reviewed: unchanged    MDM   1. Left shoulder pain   2. Osteoarthritis of left shoulder    Pt with intermittent left shoulder pain without injury, though pt with advanced OA in this joint.  Neurovascularly intact.  No chest pain, neck pain, or change in chronic SOB. Pt had  Cath in 2010 showing no obstructing disease and echo is 2012 with 55-60% EF. Doubt ACS, PE, pneumonia. See discussion above - pt has  chronic SOB and CXR shows possible early infiltrate.  I do not think this is related to his complaint today but will cover him with zpak and have him follow closely with PCP.  Discussed all results with patient.  Pt given return precautions.  Pt verbalizes understanding and agrees with plan.     I doubt any other EMC precluding discharge at this time including, but not necessarily limited to the following: ACS, PE, septic joint, aortic dissection    Trixie Dredge, PA-C 02/09/13 1348

## 2013-02-09 NOTE — ED Provider Notes (Signed)
Medical screening examination/treatment/procedure(s) were performed by non-physician practitioner and as supervising physician I was immediately available for consultation/collaboration.    Christopher J. Pollina, MD 02/09/13 1411 

## 2013-02-09 NOTE — ED Notes (Signed)
Per pt, states left shoudler pain on and off since yesterday-increased discomfort around 10pm last night-radiates to elbow-no dizziness, sweating, cp

## 2013-02-11 ENCOUNTER — Encounter: Payer: Self-pay | Admitting: Internal Medicine

## 2013-02-11 ENCOUNTER — Telehealth: Payer: Self-pay | Admitting: Internal Medicine

## 2013-02-11 ENCOUNTER — Ambulatory Visit (INDEPENDENT_AMBULATORY_CARE_PROVIDER_SITE_OTHER): Payer: Medicare Other | Admitting: Internal Medicine

## 2013-02-11 VITALS — BP 126/86 | HR 59 | Temp 97.4°F | Ht 71.0 in | Wt 204.0 lb

## 2013-02-11 DIAGNOSIS — Z23 Encounter for immunization: Secondary | ICD-10-CM

## 2013-02-11 DIAGNOSIS — R0609 Other forms of dyspnea: Secondary | ICD-10-CM

## 2013-02-11 DIAGNOSIS — G459 Transient cerebral ischemic attack, unspecified: Secondary | ICD-10-CM

## 2013-02-11 NOTE — Progress Notes (Signed)
Subjective:    Patient ID: Adrian West., male    DOB: May 30, 1928, 79 y.o.   MRN: [id number]  HPI Dyspnea  - class 3-4 following Mitral valve surgery June 2010 and resultant right diaphragm paralysis. No improvement with rehab.  2. Restricted PFTs out of pproportion to above. Seen by Dr. Katrine Coho at [facility] 2011 - suspects associated loss of height and ? etoh neuropathy (drinks 1-3 glasses wine nightly) playing a role.   OV 02/14/11: Followup for above. Last seen 08/03/10. Since then he feels dyspnea is worse. STates brough on by exertion and relieved by rest. Feels dyspneic at rest too. He feels he is dyspneic sitting in office and talking to me. Feels only 50% of lung function. This morning he told his PMD, dyspnea is stable and even at last visit he stated to me that he feels only at 50% but nevertheless he tells me he has declined in past year. Hitting golf ball 30 yards less than last summer. He is wondering if he is hypoxemic.  Due to see Dr. Katrine Coho in few weeks. Of note, when we exerted him in office - 185 x 3 laps; he did not desaturate but HR rose to 160/min. Spirometry Fev1 1.3L/41%, FVC 2.4L/57%,  Ratio 54% - this is similar to before though I cannot remember if there was obstructive component    OV 04/01/11: Followup dyspnea as above. Supppsed to have seen Dr Shirlee Latch for exertional tachycardia; got cleared by Dr Patty Sermons to switch to Dr Shirlee Latch his wife's cardiologist but he is yet to see Dr Shirlee Latch for unclear reasons. He did followup with  Dr Katrine Coho of pulmonary at [facility] and has been told that diaphragm paralysis is probably irreversible and conservative measures advised (no note yet from [facility]). He continues to play golf twice a week and reports good spirits but is bothered by the dyspnea. Still c/o persistent class 3-4 dyspnea. Very frustrating. Also has ongoing vertigo.  He again demonstrates heart rate variability. Got very tachycardic with walking in office and bradycardic with  orthostatics.  Prior to th September visit I do not recolllect this tachycardia. Never informed of it by the rehab nurses in past. He has had CPST 09/07/09 and his peak HR was only 115/min at that time.  He denies any neuromuscular symptoms  Specificially, at rest pulse ox 93%/HR 72 and we walked him 185 feet. Pulse ox stayed at 92 but HR rose to 150/min. Checked orthostats SUPINE: BP 122/82, HR 66, pulse ox 94%, -> STANDING BP 128/88, HR DRPOPPED TO 36 and he got DIZZY, and pulse ox 98%  REC Shortness of breath  - on exam and breathing test last visit - it appears that the right diaphragm is still paralyzed and there is an element of obstruction but inhalers have not helped in past for you. So, I will hold off on inhaler for now till you see cardiology for heart rate  - when we walked you again today Your heart rate rose to 150/min on our monitor  - Also, when we stood you up your heart rate dropped to 36 and you got dizzy  - So please see Dr Shirlee Latch (your wife's cardiologist) or Dr Graciela Husbands cardiology for evaluation - ASAP; Dr Patty Sermons ok with switch to Dr Shirlee Latch.  - I have spoken to Dr Graciela Husbands a heart electricity / rhytym doctor and the [facility] cardiology office should call you hopefully later today  - If you have not heard from cardiology by Monday,  please call our office  - return to see me in less than 1 month; after seeing Dr Shirlee Latch   OV 05/13/11: [place] dyspnea. In interim has seen cardiology Dr Shirlee Latch whose findings are: holter monitor showing short runs of NSVT and 19% PVCs. Echo showed preserved LV systolic function and a repaired mitral valve with probably mild MR and minimal MS. There was borderline pulmonary HTN and a normal RV. I had him do an ETT-myoview. Exercise tolerance was below average (4 minutes) but there was no evidence for ischemia or infarction. He has him on Toprol XL 25 mg bid to try to suppress PVCs and lasix. Despite this Mr Fosberg tells me that dyspnea is no better. Today  walking him 185 feet x 3 laps; HR stayed < 90 but did drop pulse ox to 87% (? Aritifact) but if true this is first occurrence for desaturation. No other new symptoms.   OV 06/22/2011 Had espophageal dilatation yestrerday -  Feels some relief wit hdyspnea. Has seen Dr Allyson Sabal CVTS at [facility] 06/10/2011  And advised against thoracotomy diaphragm plication (note reviewed). FEels tachycardia control has not helped dyspnea. Overall dyspnea is same but stable. Swimming 3 times a week. Playing golf. No decrease in golf strength in 5 years per hx; still hitting 200 yards. Thyroid function stable as of April 2012. Feels tired in pm as always x 5 years. CPST reviewed: poor effort and so uninterpretable  Past, Family, Social reviewed: no change since last visit except as in HPI   Please continue your regular exercise routine and keep yourself as fit as you can  REturn to see me in 9 months or sooner if needed  If shortness of breath gets worse call or come sooner  OV 03/20/2012 Followup chronic dyspnea  Dyspnea -  Worse somewhat. Rates it as rather severe. Says he is out of breath sitting and talking to me. Worsened by exertion even walking 50 feet. However, still playing golf 2 times a week. STill hitting 200 yards but scoring is worse; putts poorly. Says his score is worsened from mid 80s to mid 90s and is related to overall decline in game.STill swimming 3 times a week; that helps. Says he does not feel dyspneic swimming. Dyspnea is not related to posture.  Associated fatigue is stable to worse. Walk test in office 185 feet x 3 laps: pulse ox 95 -> 93%, HR 60-> 80.In office, I see him puffing his mouth during expiration - just like before but perhaps worse this time  Of note, has not seen Dr Shirlee Latch of [facility] in a year but plans to see me him. Saw Dr Katrine Coho [facility] pulmonary) in sept 2013; notes pending but he says Dr Katrine Coho has not added anything new to diagnosis or Rx.   Also of note, is his weight. THere  is no weight gain but he is overweight with truncal obesity. While he does aerobic exercises. He does not do resistance training. He does not use a Systems analyst but knows one he can hire. His  Estimated Body mass index is 28.48 kg/(m^2) as calculated from the following:   Height as of this encounter: 5\' 11" (1.803 m).   Weight as of this encounter: 204 lb 3.2 oz(92.625 kg).   Past, Family, Social reviewed: no change since last visit    In order to help avoid pressure on your diaphragm and to gain overall muscle strength  - start incentive spirometry 10times each hour during your awake hours  -  you need to lose atleast 12 pounds which wil help ease off some pressure on your abdomen fat which will help your diaphragm  - For this start low glycemic diet - see attached diet sheet. Avoid middle column and right column foods. Eat only left column foods. Follow my diet instructions below  - hire a Systems analyst and work on Emergency planning/management officer - explicitly ask him to gently work you on your core muscle and overall strength  - followup with me in 2 months     OV 02/11/2013 Dyspnea followup  -I have not seen him in 11 months. Since then dyspnea persists unchanged. In the last 30 days he has tried nocturnal oxygen presumably on an empiric basis without any overnight desaturation test. However, this is not helping dyspnea. He is even tried one week of morphine oral and this is not helping dyspnea either. Associated fatigue present. Dyspnea is impacting quality of life. His wife is urging him to seek a third opinion possibly at [facility]. He is not so sure about it. Also at last visit I recommended incentive spirometry and weight loss but he has not done either of those and he forgot about it  - Of note, 2 days ago went to the emergency department with left shoulder pain and was diagnosed to have left glenohumeral arthritis. I do notice that no cardiac enzyme markers were done    Estimated  body mass index is 28.46 kg/(m^2) as calculated from the following:   Height as of this encounter: 5\' 11"  (1.803 m).   Weight as of this encounter: 204 lb (92.534 kg).   Review of Systems  Constitutional: Negative for fever and unexpected weight change.  HENT: Negative for ear pain, nosebleeds, congestion, sore throat, rhinorrhea, sneezing, trouble swallowing, dental problem, postnasal drip and sinus pressure.   Eyes: Negative for redness and itching.  Respiratory: Positive for shortness of breath. Negative for cough, chest tightness and wheezing.   Cardiovascular: Negative for palpitations and leg swelling.  Gastrointestinal: Negative for nausea and vomiting.  Genitourinary: Negative for dysuria.  Musculoskeletal: Negative for joint swelling.  Skin: Negative for rash.  Neurological: Negative for headaches.  Hematological: Does not bruise/bleed easily.  Psychiatric/Behavioral: Negative for dysphoric mood. The patient is not nervous/anxious.        Objective:   Physical Exam Nursing note and vitals reviewed. All below are unchanged Constitutional: He is oriented to person, place, and time. He appears well-developed and well-nourished. No distress.  HENT:  Head: Normocephalic and atraumatic.  Right Ear: External ear normal.  Left Ear: External ear normal.  Mouth/Throat: Oropharynx is clear and moist. No oropharyngeal exudate.  Eyes: Conjunctivae and EOM are normal. Pupils are equal, round, and reactive to light. Right eye exhibits no discharge. Left eye exhibits no discharge. No scleral icterus.  Neck: Normal range of motion. Neck supple. No JVD present. No tracheal deviation present. No thyromegaly present.  Cardiovascular: Normal rate, regular rhythm and intact distal pulses.  Exam reveals no gallop and no friction rub.   No murmur heard. Pulmonary/Chest: Effort normal. No respiratory distress. He has no wheezes. He has no rales. He exhibits no tenderness.  HE IS PUFFING and  APPEARS TO BE MILDLY PANTING EVEN WHEN SITTING AND TALKING TO ME      Diminished air entry rt base  Abdominal: Soft. Bowel sounds are normal. He exhibits no distension and no mass. There is no tenderness. There is no rebound and no guarding.  Musculoskeletal: Normal range of  motion. He exhibits no edema and no tenderness.       Walks with hunch  Lymphadenopathy:    He has no cervical adenopathy.  Neurological: He is alert and oriented to person, place, and time. He has normal reflexes. No cranial nerve deficit. Coordination normal.       Mild LUE tremor  Skin: Skin is warm and dry. No rash noted. He is not diaphoretic. No erythema. No pallor.  Psychiatric: He has a normal mood and affect. His behavior is normal. Judgment and thought content normal.           Assessment & Plan:

## 2013-02-11 NOTE — Patient Instructions (Addendum)
In order to help avoid pressure on your diaphragm and to gain overall muscle strength  - start incentive spirometry 10times each hour during your awake hours; cma will do script  - I hve emailed my contact in [place] [employer] to facilitate a referral process - Will keep you posted

## 2013-02-11 NOTE — Telephone Encounter (Signed)
Let patient know that person to see [facility] [employer] [facility] [place] [facility] Hammed and best way for referral is call lung line [phone]. I have placed an order   Thanks  Dr. Kalman Shan, M.D., [facility].C.P Pulmonary and Critical Care Medicine Staff Physician [facility] [place] [facility] Pager: [phone], If no answer or between  15:00h - 7:00h: call [phone]  02/11/2013 12:49 PM

## 2013-02-12 ENCOUNTER — Telehealth: Payer: Self-pay | Admitting: Internal Medicine

## 2013-02-12 ENCOUNTER — Telehealth: Payer: Self-pay | Admitting: *Deleted

## 2013-02-12 ENCOUNTER — Ambulatory Visit (INDEPENDENT_AMBULATORY_CARE_PROVIDER_SITE_OTHER): Payer: Medicare Other | Admitting: Cardiology

## 2013-02-12 VITALS — BP 132/90 | HR 68 | Ht 71.0 in | Wt 204.0 lb

## 2013-02-12 DIAGNOSIS — I493 Ventricular premature depolarization: Secondary | ICD-10-CM

## 2013-02-12 DIAGNOSIS — Z9889 Other specified postprocedural states: Secondary | ICD-10-CM

## 2013-02-12 DIAGNOSIS — I4949 Other premature depolarization: Secondary | ICD-10-CM

## 2013-02-12 DIAGNOSIS — G459 Transient cerebral ischemic attack, unspecified: Secondary | ICD-10-CM

## 2013-02-12 DIAGNOSIS — I080 Rheumatic disorders of both mitral and aortic valves: Secondary | ICD-10-CM

## 2013-02-12 NOTE — Telephone Encounter (Signed)
plse have [facility] set up the [employer] [facility] referral - order in. Give him fu in 3 months  Dr. Kalman Shan, M.D., [facility].C.P Pulmonary and Critical Care Medicine Staff Physician [facility] [place] [facility]monary and Critical Care Pager: [phone], If no answer or between  15:00h - 7:00h: call [phone]  02/12/2013 8:37 AM

## 2013-02-12 NOTE — Telephone Encounter (Signed)
I called and LMVM for pt to return call, re: appt

## 2013-02-12 NOTE — Telephone Encounter (Signed)
I have placed reminder recall in EPIC. Will forward to PCC's to ensure they did receive order.

## 2013-02-12 NOTE — Assessment & Plan Note (Signed)
The patient has a past history of frequent premature beats.  On exam today he is having occasional PVCs.  These are asymptomatic.

## 2013-02-12 NOTE — Telephone Encounter (Signed)
lmomtcb x1 for pt 

## 2013-02-12 NOTE — Assessment & Plan Note (Signed)
The patient is having no evidence of recurrent mitral regurgitation following his successful mitral valve repair.  No symptoms of CHF.  He does have persistent exertional dyspnea and is considering going out to [facility] in [place].  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

## 2013-02-12 NOTE — Assessment & Plan Note (Signed)
Dsypnea unchanged since mtitral valve repair and paralysis of diaphragm despite various interventions including O2 and morphine recently. He has nto tried incentive spirometry; have advised him again. Strongly advised referral to [facility] in [place], [place]. Got the name of DR Fredda Hammed to see (his wife is keen he goes there as well). ROV 3 months  (> 50% of this 15 min visit spent in face to face counseling)

## 2013-02-12 NOTE — Telephone Encounter (Signed)
Records faxed pt will be contacted T[facility] Sos

## 2013-02-12 NOTE — Patient Instructions (Addendum)
Your physician recommends that you continue on your current medications as directed. Please refer to the Current Medication list given to you today.  Your physician wants you to follow-up in: 6 month ov/ekg You will receive a reminder letter in the mail two months in advance. If you don't receive a letter, please call our office to schedule the follow-up appointment.  

## 2013-02-12 NOTE — Assessment & Plan Note (Signed)
The patient has had a prior TIA.  He is on aspirin and Plavix.  He has had no recurrent TIA symptoms.

## 2013-02-12 NOTE — Progress Notes (Signed)
Adrian Skeens Pelto Jr. Date of Birth:  [birth year] [facility] [address] [place], [place]  [address] [phone]         Fax   [phone]  History of Present Illness: This 79 year old gentleman is seen for a followup office visit. Marland Kitchen He has a complicated past cardiac history. He has a history of mitral valve repair in 2010. 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 He has a history of chronic PVCs and chronic exertional dyspnea. He has had a thorough pulmonary workup both here in [place] with Dr. Marchelle Gearing as well as at [facility]. He is also followed closely by his PCP Dr. Debby Bud. His last echocardiogram was 04/04/11 which showed an ejection fraction of 55-60% with grade 1 diastolic dysfunction. He was status post mitral valve repair with minimal mitral stenosis and mild mitral regurgitation as per my artery pressure was 37. The patient does not have any history of ischemic heart disease. He had a left heart cardiac catheterization in May 2010 which showed no obstructive disease. He had a treadmill Myoview in November 2012 but was able to exercise only 4 minutes stopped because of fatigue and was converted to a Lexa scan and showed no evidence for ischemia or infarction and his ejection fraction was 54% .   Current Outpatient Prescriptions  Medication Sig Dispense Refill  . aspirin 81 MG tablet Take 81 mg by mouth daily.        Marland Kitchen atorvastatin (LIPITOR) 10 MG tablet Take 10 mg by mouth daily.      Marland Kitchen azithromycin (ZITHROMAX) 250 MG tablet Take 1 tablet (250 mg total) by mouth daily. Take first 2 tablets together, then 1 every day until finished.  6 tablet  0  . clopidogrel (PLAVIX) 75 MG tablet Take 75 mg by mouth daily.      Marland Kitchen levothyroxine (SYNTHROID, LEVOTHROID) 125 MCG tablet Take 125 mcg by mouth daily before breakfast.      . metoprolol succinate (TOPROL-XL) 25 MG 24 hr tablet Take 25 mg  by mouth daily.      . Multiple Vitamin (MULTIVITAMIN WITH MINERALS) TABS tablet Take 1 tablet by mouth daily.      . NON FORMULARY Oxygen 2 LPM QHS      . sildenafil (VIAGRA) 100 MG tablet Take 1 tablet (100 mg total) by mouth daily as needed. Not to be taking with Nitroglycerin  30 tablet  0   No current facility-administered medications for this visit.    No Known Allergies  Patient Active Problem List   Diagnosis Date Noted  . Orthostatic hypotension 10/22/2012  . Frequent unifocal PVCs 06/28/2012  . Overweight (BMI 25.0-29.9) 03/20/2012  . Lymphocytosis 02/24/2012  . Other and unspecified hyperlipidemia 02/24/2012  . PVC's (premature ventricular contractions) 04/05/2011  . Tachycardia - pulse 04/01/2011  . TIA (transient ischemic attack) 10/12/2010  . SPINAL STENOSIS, LUMBAR 07/28/2010  . LOSS OF HEIGHT 03/16/2010  . DYSPNEA/SHORTNESS OF BREATH 02/11/2010  . MEMORY LOSS 12/31/2009  . GROIN MASS, RIGHT 12/31/2009  . ESOPHAGEAL STRICTURE 10/27/2008  . DYSPHAGIA 10/27/2008  . HYPOTHYROIDISM 02/14/2008  . MITRAL REGURGITATION 02/14/2008  . HYPERTENSION 02/14/2008  . GERD 02/14/2008  . DIVERTICULOSIS, COLON 02/14/2008  . SKIN CANCER, HX OF 02/14/2008  . COLONIC POLYPS, HX OF 02/14/2008    History  Smoking status  . Never Smoker   Smokeless tobacco  . Never Used    History  Alcohol Use  No    Comment:  None as of 01/09/12    Family History  Problem Relation Age of Onset  . Heart attack Father 56  . Colon cancer Mother 60  . Heart attack Maternal Grandmother 60  . Uterine cancer Paternal Grandmother   . Stroke Neg Hx   . Diabetes Neg Hx     Review of Systems: Constitutional: no fever chills diaphoresis or fatigue or change in weight.  Head and neck: no hearing loss, no epistaxis, no photophobia or visual disturbance. Respiratory: No cough, shortness of breath or wheezing. Cardiovascular: No chest pain peripheral edema, palpitations. Gastrointestinal: No  abdominal distention, no abdominal pain, no change in bowel habits hematochezia or melena. Genitourinary: No dysuria, no frequency, no urgency, no nocturia. Musculoskeletal:No arthralgias, no back pain, no gait disturbance or myalgias. Neurological: No dizziness, no headaches, no numbness, no seizures, no syncope, no weakness, no tremors. Hematologic: No lymphadenopathy, no easy bruising. Psychiatric: No confusion, no hallucinations, no sleep disturbance.    Physical Exam: Filed Vitals:   02/12/13 1605  BP: 132/90  Pulse: 68   the general appearance reveals a well-developed elderly gentleman in no distress.  His weight is down 2 pounds since I last saw him.  He states he would like to lose another 10 or 15 pounds to get back to his previous tennis playing weight.The head and neck exam reveals pupils equal and reactive.  Extraocular movements are full.  There is no scleral icterus.  The mouth and pharynx are normal.  The neck is supple.  The carotids reveal no bruits.  The jugular venous pressure is normal.  The  thyroid is not enlarged.  There is no lymphadenopathy.  The chest reveals decreased breath sounds on the right.  The precordium is quiet.  The first heart sound is normal.  The second heart sound is physiologically split.  There is no murmur gallop rub or click.  There is no abnormal lift or heave.  The abdomen is soft and nontender.  The bowel sounds are normal.  The liver and spleen are not enlarged.  There are no abdominal masses.  There are no abdominal bruits.  Extremities reveal good pedal pulses.  There is no phlebitis or edema.  There is no cyanosis or clubbing.  Strength is normal and symmetrical in all extremities.  There is no lateralizing weakness.  There are no sensory deficits.  The skin is warm and dry.  There is no rash.     Assessment / Plan: Continue same medication.  Recheck here in 6 months for office visit and EKG

## 2013-02-13 NOTE — Telephone Encounter (Signed)
Pt returned call & can be reached at [phone].  Pt asks if a message with more description can be left.  Adrian West

## 2013-02-13 NOTE — Telephone Encounter (Signed)
ATC PT NA and unable to leave VM. wcb

## 2013-02-14 NOTE — Telephone Encounter (Signed)
Pt returned call & can be reached at [phone] or [email or web address]  Antionette Fairy

## 2013-02-14 NOTE — Telephone Encounter (Signed)
Pt made aware of contact information per MR for Dr Constance Goltz.  Nothing further needed.

## 2013-02-14 NOTE — Telephone Encounter (Signed)
Pt called back and wanted an appt with Dr. Anne Hahn.  We tried to call him and offer an appt with Dr. Marjory Lies today and he was not home.   Will attempt to call again.  I called this am 02-15-13 and pt already has appt in 04-05-13 with Dr. Anne Hahn.

## 2013-02-14 NOTE — Telephone Encounter (Signed)
ATC, NA, no voicemail. WCb. [name], CMA

## 2013-02-15 ENCOUNTER — Encounter: Payer: Self-pay | Admitting: Internal Medicine

## 2013-02-15 ENCOUNTER — Telehealth: Payer: Self-pay | Admitting: Internal Medicine

## 2013-02-15 ENCOUNTER — Ambulatory Visit (INDEPENDENT_AMBULATORY_CARE_PROVIDER_SITE_OTHER): Payer: Medicare Other | Admitting: Internal Medicine

## 2013-02-15 VITALS — BP 150/90 | HR 67 | Ht 71.0 in | Wt 200.0 lb

## 2013-02-15 DIAGNOSIS — K222 Esophageal obstruction: Secondary | ICD-10-CM

## 2013-02-15 DIAGNOSIS — M48061 Spinal stenosis, lumbar region without neurogenic claudication: Secondary | ICD-10-CM

## 2013-02-15 DIAGNOSIS — G459 Transient cerebral ischemic attack, unspecified: Secondary | ICD-10-CM

## 2013-02-15 DIAGNOSIS — R413 Other amnesia: Secondary | ICD-10-CM

## 2013-02-15 DIAGNOSIS — R1310 Dysphagia, unspecified: Secondary | ICD-10-CM

## 2013-02-15 DIAGNOSIS — K219 Gastro-esophageal reflux disease without esophagitis: Secondary | ICD-10-CM

## 2013-02-15 NOTE — Telephone Encounter (Signed)
Pt has referrals from Pulmonary to Neurology.  He wants the referral to come from Dr. Debby Bud to [facility].  Libby in Pulmonary is aware.

## 2013-02-15 NOTE — Progress Notes (Signed)
HISTORY OF PRESENT ILLNESS:  Adrian West. is a 79 y.o. male with multiple significant medical problems as listed below. He presents today with GI and non-GI issues/questions. First, he inquires about the need for esophageal dilation. He is known to have GERD complicated by peptic stricture requiring esophageal dilation. His last dilation was performed in January 2013 with balloons to 19 mm. He had on PPI, but is not currently. Overall, his swallowing has been quite good. He describes an episode of transient swallowing difficulty with tuna about 6 months ago. Otherwise, no problems. He is on Plavix. He mentions to me intermittent choking or coughing episodes described as hacking. This is not related to meals. He denies reflux symptoms. Next, he inquires about the need for colonoscopy. Patient has a family history of colon cancer in his parent at age 68. His last complete colonoscopy was warranted 2006 and was normal except for extensive diverticulosis. He denies any lower GI symptoms such as change in bowel habits, lower abdominal pain, or rectal bleeding. Next, she reports to me that his most significant problems are chronic vertigo and chronic shortness of breath. 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 He is wondering whether he should investigate time and travel, or not. He apparently has had evaluations in [place] as well as [facility].  REVIEW OF SYSTEMS:  All non-GI ROS negative except for arthritis, back pain, cough, fatigue, headaches, hearing problems, heart murmur, muscle cramps, nosebleeds, shortness of breath, sleeping problems, ankle swelling  Past Medical History  Diagnosis Date  . Thyroid disease   . Hypertension   . GERD (gastroesophageal reflux disease)     PMH of stricture , recurrent;Dr Marina Goodell  . Benign neoplasm of colon   . Tremor, essential   . Diverticulosis   . TIA (transient  ischemic attack) May 2012  . Skin cancer   . Neuropathy, peripheral   . Lung abnormality 11/25/2008    "nerve damage from heart surgery"  . Hyperlipidemia     Past Surgical History  Procedure Laterality Date  . Appendectomy    . Cataract extraction      Dr Dagoberto Ligas  . Cholecystectomy  1998    Dr [name]  . Inguinal hernia repair    . Lumbar laminectomy    . Nasal sinus surgery    . Tonsillectomy    . Esophageal dilation       X3;Dr Marina Goodell  . 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 Laparoscopic ablation renal mass  08/14/2009  . Mohs surgery  2012     ear    Social History Adrian West.  reports that he has never smoked. He has never used smokeless tobacco. He reports that he does not drink alcohol or use illicit drugs.  family history includes Colon cancer (age of onset: 12) in his mother; Heart attack (age of onset: 56) in his father; Heart attack (age of onset: 86) in his maternal grandmother; Uterine cancer in his paternal grandmother. There is no history of Stroke or Diabetes.  No Known Allergies     PHYSICAL EXAMINATION: Vital signs: BP 150/90  Pulse 67  Ht 5\' 11"  (1.803 m)  Wt 200 lb (90.719 kg)  BMI 27.91 kg/m2 General: Well-developed, well-nourished, no acute distress. Pursed lip breathing HEENT: Sclerae are anicteric, conjunctiva pink. Oral mucosa intact Lungs: Clear Heart: Regular Abdomen:  soft, nontender, nondistended, no obvious ascites, no peritoneal signs, normal bowel sounds. No organomegaly. Extremities: No edema Psychiatric: alert and oriented x3. Cooperative   ASSESSMENT:  #1. GERD complicated by peptic stricture. Last esophageal dilation January 2013. No significant esophageal dysphagia symptoms at present. I do not think he needs esophageal dilation at this time. If significant dysphagia were 2 return, he may need dilation. He is, however, at high risk given his  comorbidities. #2. Colon cancer screening. Not appropriate in this 79 year old gentleman with multiple comorbidities and a negative examination 2006. Reviewed with him #3. Multiple non-GI questions/issues  PLAN:  #1. Resume omeprazole 20 mg daily. OTC. #2. Return to the care of your primary provider and specialists regarding ongoing general medical care and active issues/questions #3. GI office followup in 1 year (patient request).

## 2013-02-15 NOTE — Patient Instructions (Addendum)
Please follow up with Dr. Perry in one year 

## 2013-02-15 NOTE — Telephone Encounter (Signed)
Reviewed pulmonary note: saw a referral to [facility] in [place] but did not note referral to neurology.  I need to know the reason for referral to neurology.

## 2013-02-15 NOTE — Telephone Encounter (Signed)
I called [name] back. Pulmonary is working with him on the [facility] in [place]. He states that Dr. Sandria Manly retired.  The note in the Patient Instructions on the June 11 office visit advised him to continue with Neurology.  Since Dr. Sandria Manly has retired he wants to be scheduled with [facility] Neurology.

## 2013-02-15 NOTE — Telephone Encounter (Signed)
Thank you - got it. Referral is placed.

## 2013-02-18 ENCOUNTER — Encounter: Payer: Self-pay | Admitting: Internal Medicine

## 2013-02-18 ENCOUNTER — Telehealth: Payer: Self-pay | Admitting: Internal Medicine

## 2013-02-18 NOTE — Telephone Encounter (Signed)
What is the status of his refrral with [facility]. He left some statement for my review and it seems like he wants to know from them by end of the month or see them by end of month  Dr. Kalman Shan, M.D., [facility].C.P Pulmonary and Critical Care Medicine Staff Physician [place] [facility] [place] Pulmonary and Critical Care Pager: [phone], If no answer or between  15:00h - 7:00h: call [phone]  02/18/2013 5:10 PM

## 2013-02-18 NOTE — Telephone Encounter (Signed)
Forms given to MR to review. Adrian West, CMA

## 2013-02-20 NOTE — Telephone Encounter (Signed)
Spoke with patient--  Patient states he is set up to talk with the [facility] via Dr. Thera Flake on Friday at 12 noon for a 1 hr interview. Per patient once this is done and based on what Dr. Corky Downs says he would like for Dr. Marchelle Gearing, Dr. Arthur Holms and Dr. Corky Downs to all come to a conclusion on whether patients lungs can be rehabilitated.  Patient states he is very hopeful will going ahead with procedure if mean helping his lungs but he is also realistic. At this time patient reports nothing is needed from Dr. Marchelle Gearing, patient will be back in touch with him after his interview Friday. Patient sends his thanks and appreciation to Dr. Marchelle Gearing!! Will forward back to MR

## 2013-02-25 ENCOUNTER — Encounter: Payer: Self-pay | Admitting: Internal Medicine

## 2013-02-25 ENCOUNTER — Ambulatory Visit (INDEPENDENT_AMBULATORY_CARE_PROVIDER_SITE_OTHER): Payer: Medicare Other | Admitting: Internal Medicine

## 2013-02-25 VITALS — BP 130/76 | HR 69 | Ht 71.0 in | Wt 200.8 lb

## 2013-02-25 DIAGNOSIS — R0609 Other forms of dyspnea: Secondary | ICD-10-CM

## 2013-02-26 NOTE — Assessment & Plan Note (Signed)
Reviewed with the patient previous notes, consults, imaging studies. Discussed recent attempts at treatment including supplemental oxygen, which he still uses at night, and a trial of morphine which did not alleviate his symptoms. He is in conversations with [facility], Dr. Constance Goltz, in [facility] about a consultation. When asked he was advised that there is a small but real possibility that there may be novel treatments available at [facility] referral center.  Plan Help to insure that all records have been forwarded to [facility]  Discussed alternative medicine - provided brochure from Ms. Mu, acupuncturist and [place] herbalist    (greater than 50% of  25 min visit spent on education and counseling)

## 2013-02-26 NOTE — Progress Notes (Signed)
  Subjective:    Patient ID: Adrian West., male    DOB: [birth year], 79 y.o.   MRN: [id number]  HPI Adrian West presents to discuss his on-going problem with dyspnea. He has had no new medical problems.  PMH, FamHx and SocHx reviewed for any changes and relevance.  Current Outpatient Prescriptions on File Prior to Visit  Medication Sig Dispense Refill  . aspirin 81 MG tablet Take 81 mg by mouth daily.        Marland Kitchen atorvastatin (LIPITOR) 10 MG tablet Take 10 mg by mouth daily.      Marland Kitchen azithromycin (ZITHROMAX) 250 MG tablet Take 1 tablet (250 mg total) by mouth daily. Take first 2 tablets together, then 1 every day until finished.  6 tablet  0  . clopidogrel (PLAVIX) 75 MG tablet Take 75 mg by mouth daily.      Marland Kitchen levothyroxine (SYNTHROID, LEVOTHROID) 125 MCG tablet Take 125 mcg by mouth daily before breakfast.      . metoprolol succinate (TOPROL-XL) 25 MG 24 hr tablet Take 25 mg by mouth daily.      . Multiple Vitamin (MULTIVITAMIN WITH MINERALS) TABS tablet Take 1 tablet by mouth daily.      . NON FORMULARY Oxygen 2 LPM QHS      . sildenafil (VIAGRA) 100 MG tablet Take 1 tablet (100 mg total) by mouth daily as needed. Not to be taking with Nitroglycerin  30 tablet  0   No current facility-administered medications on file prior to visit.      Review of Systems System review is negative for any constitutional, cardiac, pulmonary, GI or neuro symptoms or complaints other than as described in the HPI.     Objective:   Physical Exam Filed Vitals:   02/25/13 1021  BP: 130/76  Pulse: 69   Oxygen Satuartion 97% on room air  gen'l - older white man in no acute distgress but appears dyspneic Neuro - A&O, speech and cognition normal, normal get up and go, normal gait.       Assessment & Plan:

## 2013-02-27 NOTE — Telephone Encounter (Signed)
Find out if ghe is goint to [facility] or not?   Dr. Kalman Shan, M.D., [facility].C.P Pulmonary and Critical Care Medicine Staff Physician [place] [facility] [place] Pulmonary and Critical Care Pager: [phone], If no answer or between  15:00h - 7:00h: call [phone]  02/27/2013 4:35 PM

## 2013-02-27 NOTE — Telephone Encounter (Signed)
Find out status on if he is going to go to [facility] or not. If so, when ?  Dr. Kalman Shan, M.D., [facility].C.P Pulmonary and Critical Care Medicine Staff Physician [place] [facility] [place] Pulmonary and Critical Care Pager: [phone], If no answer or between  15:00h - 7:00h: call [phone]  02/27/2013 8:34 AM

## 2013-03-05 NOTE — Telephone Encounter (Signed)
I spoke with the pt and he states that he had a phone interview with an Geophysicist/field seismologist at [facility]. He states that they stated they need records from [place], Dr. Katrine Coho, and Dr Debby Bud for review. Once they review these they will set the pt up with a doctor there if they feel they can help him. Our records have already been sent so they are waiting on the other doctors.  Pt states he is very impressed with them so far.  Pt states he will notify us as soon as he has an appt there.

## 2013-03-06 ENCOUNTER — Encounter: Payer: Self-pay | Admitting: Neurology

## 2013-03-06 ENCOUNTER — Ambulatory Visit (INDEPENDENT_AMBULATORY_CARE_PROVIDER_SITE_OTHER): Payer: Medicare Other | Admitting: Neurology

## 2013-03-06 VITALS — BP 128/84 | HR 80 | Temp 97.6°F | Resp 22 | Ht 69.0 in | Wt 199.0 lb

## 2013-03-06 DIAGNOSIS — R42 Dizziness and giddiness: Secondary | ICD-10-CM

## 2013-03-06 DIAGNOSIS — R413 Other amnesia: Secondary | ICD-10-CM

## 2013-03-06 DIAGNOSIS — E039 Hypothyroidism, unspecified: Secondary | ICD-10-CM

## 2013-03-06 LAB — HEMOGLOBIN A1C: Hgb A1c MFr Bld: 5.5 % (ref 4.6–6.5)

## 2013-03-06 NOTE — Progress Notes (Signed)
[facility] Clinic Note - Initial Visit   Date: 03/06/2013   Adrian Skeens Paquette Jr. MRN: [id number] DOB: 1928/03/10   Dear Dr Debby Bud:  Thank you for your kind referral of Adrian West. for consultation of vertigo. Although his history is well known to you, please allow [place] to reiterate it for the purpose of our medical record. The patient was accompanied to the clinic by self.   History of Present Illness: Adrian West. is a 79 y.o. 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    He was seeing Dr. Sandria Manly for 30+ years for peripheral neuropathy, left hand tremor, and vertigo.  He was last seen in March 2014 at which time he was told that there was no additional therapies that could be offered.  He was referred to our office today to establish care.  Adrian West has a 10-year history of vertigo, described as room-spinning and intermittent.  It is triggered by changes in position, such standing up or leaning over.  Symptoms last about 2-minutes.  It occurs about 6-10 times a day, depending on activity level.  He has never fallen, but does hold on to objects.  Rolling in bed or abrupt changes in head position does not worse these symptoms.  No associated headaches, blurry vision, nausea, or vomiting.  He endorses dry eyes, dry mouth, early satiety, and anhydrosis.  No diarrhea or constipation.  Dr. love had attempted full Adrian West to be seen by a Dr. Lovey West for inner ear problems, but due to insurance reasosn he was not seen.  Additionally, he reports "blacking out" over the past 2-3 years. There is no associated loss of consciousness with these spells, but he feels tired and weak. Spells  generally last about an hour and there is no prodromal symptoms. In June, he passed out and again did not lose consciousness but continued to feel weak.  He was admitted and symptoms were attributed to dehydration.  He still plays golf.  He exercises about 3x per week and uses a chair for stability.  Of note, he has had an episode of slurred speech in May 2012 which lasted several hours. This occurred in the setting of missing 2 days of his aspirin. He had a stroke workup which was negative and symptoms were attributed to possible TIA. MRI of the brain 10/20/2010 showed mild periventricular subcortical chronic small vessel disease. Moderate A. was normal. He was briefly on Plavix but developed a rash so was maintained on aspirin.  Regarding his shortness of breath, his workup has included EMG (5 06-2010), CPK, VGCC, anti-Hu, anti-Ri, and acetylcholine receptor antibodies which is within normal.  Out-side paper records, electronic medical record, and images have been reviewed where available.   Past Medical History  Diagnosis Date  . Thyroid disease   . Hypertension   . GERD (gastroesophageal reflux disease)     PMH of stricture , recurrent;Dr Marina Goodell  . Benign neoplasm of colon   . Tremor, essential   . Diverticulosis   . TIA (transient ischemic attack) May 2012  . Skin cancer   . Neuropathy, peripheral   . Lung abnormality 11/25/2008    "nerve damage from heart surgery"  . Hyperlipidemia     Past Surgical History  Procedure Laterality Date  .  Appendectomy    . Cataract extraction      Dr Dagoberto Ligas  . Cholecystectomy  1998    Dr [name]  . Inguinal hernia repair    . Lumbar laminectomy    . Nasal sinus surgery    . Tonsillectomy    . Esophageal dilation       X3;Dr Marina Goodell  . 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 Laparoscopic ablation renal mass  08/14/2009  . Mohs surgery  2012     ear     Medications:   Current Outpatient Prescriptions on File Prior to Visit  Medication Sig Dispense Refill  . aspirin 81 MG tablet Take 81 mg by mouth daily.        Marland Kitchen atorvastatin (LIPITOR) 10 MG tablet Take 10 mg by mouth daily.      . clopidogrel (PLAVIX) 75 MG tablet Take 75 mg by mouth daily.      Marland Kitchen levothyroxine (SYNTHROID, LEVOTHROID) 125 MCG tablet Take 125 mcg by mouth daily before breakfast.      . metoprolol succinate (TOPROL-XL) 25 MG 24 hr tablet Take 25 mg by mouth daily.      . Multiple Vitamin (MULTIVITAMIN WITH MINERALS) TABS tablet Take 1 tablet by mouth daily.      . NON FORMULARY Oxygen 2 LPM QHS      . sildenafil (VIAGRA) 100 MG tablet Take 1 tablet (100 mg total) by mouth daily as needed. Not to be taking with Nitroglycerin  30 tablet  0   No current facility-administered medications on file prior to visit.    Allergies: No Known Allergies  Family History: Family History  Problem Relation Age of Onset  . Heart attack Father 56  . Colon cancer Mother 71  . Heart attack Maternal Grandmother 60  . Uterine cancer Paternal Grandmother   . Stroke Neg Hx   . Diabetes Neg Hx   . Healthy Sister     Social History: History   Social History  . Marital Status: Married    Spouse Name: N/A    Number of Children: N/A  . Years of Education: N/A   Occupational History  . Not on file.   Social History Main Topics  . Smoking status: Never Smoker   . Smokeless tobacco: Never Used  . Alcohol Use: 0.0 oz/week     Comment: Drinks wine 3 per night  . Drug Use: No  . Sexual Activity: Not on file   Other Topics Concern  . 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   Married x 53 years.  They have three daughters.    Review of Systems:  CONSTITUTIONAL: No fevers, chills, night sweats, or weight loss.  + Dizziness EYES: No visual changes or eye pain ENT: No hearing changes.  No history of nose bleeds.    RESPIRATORY: No cough, wheezing + shortness of breath.   CARDIOVASCULAR: Negative for chest pain, and palpitations.   GI: Negative for abdominal discomfort, blood in stools or black stools.  No recent change in bowel habits.   GU:  No history of incontinence.   MUSCLOSKELETAL: No history of joint pain or swelling.  No myalgias.   SKIN: Negative for lesions, rash, and itching.   HEMATOLOGY/ONCOLOGY: Negative for prolonged bleeding, bruising easily, and swollen nodes.  No history of cancer.   ENDOCRINE: Negative for cold  or heat intolerance, polydipsia or goiter.   PSYCH:  No depression or anxiety symptoms.   NEURO: As Above.   Vital Signs:  BP 130/80  Pulse 76  Temp(Src) 97.6 F (36.4 C)  Resp 22  Ht 5\' 9"  (1.753 m)  Wt 199 lb (90.266 kg)  BMI 29.37 kg/m2 Pain Scale: 0 on a scale of 0-10   General Medical Exam:   General:  Well appearing, comfortable.   Eyes/ENT: see cranial nerve examination.   Neck: No masses appreciated.  Full range of motion without tenderness.  There is no overshoot of eye movements with head thrust testing Respiratory:  Clear to auscultation, good air entry bilaterally.   Cardiac:  Regular rate and rhythm, no murmur.   GI:  Soft, non-tender, non-distended abdomen.  Bowel sounds normal. Extremities:  No deformities, edema, or skin discoloration.  Skin:  Excoriations along the anterior surface of the legs bilaterally and reduced pattern of hair growth in the lower extremities.   Neurological Exam: MENTAL STATUS including orientation to time, place, person, recent and remote memory, attention span and concentration, language, and fund of knowledge is normal.  Speech is not dysarthric.  CRANIAL NERVES: II:  No visual field defects.  Unremarkable fundi.   III-IV-VI: Pupils equal round and reactive to light.  Normal conjugate, extra-ocular eye movements in all directions of gaze.  Physiological end-point nystagmus bilaterally.  No ptosis prior to or post  sustained upgaze.   V:  Normal facial sensation.  Jaw jerk is present.   VII:  Normal facial symmetry and movements.  Snout, palmomental reflexes, and Myerson's signs are present.    VIII:  Normal hearing and vestibular function.   IX-X:  Normal palatal movement.   XI:  Normal shoulder shrug and head rotation.   XII:  Normal tongue strength and range of motion, no deviation or fasciculation.  MOTOR:  Mild atrophy of intrinsic hand muscles bilaterally.  Left > right postural hand tremor with low amplitude.  Tremor is not present at rest. No fasciculations or pronator drift.      Right Upper Extremity:    Left Upper Extremity:    Deltoid  5/5   Deltoid  5/5   Biceps  5/5   Biceps  5/5   Triceps  5-/5   Triceps  5-/5   Wrist extensors  5/5   Wrist extensors  5/5   Wrist flexors  5/5   Wrist flexors  5/5   Finger extensors  5/5   Finger extensors  5/5   Finger flexors  5/5   Finger flexors  5/5   Dorsal interossei  4/5   Dorsal interossei  4/5   Abductor pollicis  5/5   Abductor pollicis  5/5   Tone (Ashworth scale)  0  Tone (Ashworth scale)  0   Right Lower Extremity:    Left Lower Extremity:    Hip flexors  5/5   Hip flexors  5/5   Hip extensors  5/5   Hip extensors  5/5   Knee flexors  5/5   Knee flexors  5/5   Knee extensors  5/5   Knee extensors  5/5   Dorsiflexors  5/5   Dorsiflexors  5/5   Plantarflexors  5-/5   Plantarflexors  5-/5   Toe extensors  4+/5   Toe extensors  4+/5   Toe flexors  5/5   Toe flexors  5/5   Tone (Ashworth scale)  0  Tone (Ashworth scale)  0  MSRs:  Right                                                                 Left brachioradialis 2+  brachioradialis 2+  biceps 2+  biceps 2+  triceps 1+  triceps 1+  patellar 2+  patellar 2+  ankle jerk 0  ankle jerk 0  Hoffman no  Hoffman no  plantar response down  plantar response down    SENSORY:  Vibration reduced to 50% at knees and absent distal to ankles bilaterally.  Light touch, proprioception,  and temperature is intact  Romberg's sign is present.  COORDINATION/GAIT: Normal finger-to- nose-finger and heel-to-shin.  Bradykinesia with reduced rate of finger tapping and toe tapping bilaterally (L >R)  Able to rise from a chair without using arms.  Slightly stooped posture with small steps, gait appears narrow and stable.  He is unsteady with heel walking and tandem gait.   Data: MRI brain 10/22/2012: No acute infarct.  Mild small vessel disease type changes.  Global atrophy without hydrocephalus.  No intracranial mass lesion detected on this unenhanced exam.  Polypoid complex opacification left maxillary sinus. Remodeling of the left alveolar ridge without change. CT imaging of the sinuses can be obtained for further delineation if clinically desired.  US carotids 10/11/2010: Mild intimal thickening bilaterally without significant plaque formation or stenosis.   IMPRESSION: Adrian West is a delightful 79 year-old man presenting for evaluation of vertigo.  He also has a history of peripheral neuropathy and essential tremors, however, given the restraints of time, we decided to focus today's visit on vertigo since it seems to be his most bothersome symptom.  Based on his history and exam, his vertigo does not seem to be related to in the near pathology and may be due to orthostatic hypotension. On review of some of his prior notes it appears that at least at one clinic visit there was a drop in his blood pressure from 135/66 to 96/66. Orthostatic vital signs checked today were normal. I would like to evaluate for treatable causes of neuropathy, as his prior notes indicate paraproteinemia but he has not seen hematology. It is possible that symptoms are related to the not only a peripheral neuropathy but also affecting the autonomic nervous system. The possibilities to consider include neurodegenerative conditions such as multiple system atrophy.   PLAN/RECOMMENDATIONS:  1. Check the following  labs: . Ceruloplasmin  . Copper, serum  . Methylmalonic acid, serum  . Protein electrophoresis, serum  . Immunofixation electrophoresis  . IFE, Urine (with Tot Prot)  . Hemoglobin A1c  . TSH  . Vitamin B12  2.  Consider tilt table evaluation going forward 3.  Fall precautions discussed 4.  Return to clinic in 2-weeks  The duration of this appointment visit was 60 minutes of face-to-face time with the patient.  Greater than 50% of this time was spent in counseling, explanation of diagnosis, planning of further management, and coordination of care.   Thank you for allowing me to participate in patient's care.  If I can answer any additional questions, I would be pleased to do so.    Sincerely,    Angeleena Dueitt K. Allena Katz, DO

## 2013-03-08 LAB — PROTEIN ELECTROPHORESIS, SERUM
Alpha-1-Globulin: 5.1 % — ABNORMAL HIGH (ref 2.9–4.9)
Alpha-2-Globulin: 10.4 % (ref 7.1–11.8)
Gamma Globulin: 9.5 % — ABNORMAL LOW (ref 11.1–18.8)
M-Spike, %: 0.19 g/dL
Total Protein, Serum Electrophoresis: 6.4 g/dL (ref 6.0–8.3)

## 2013-03-08 LAB — IMMUNOFIXATION ELECTROPHORESIS
IgA: 78 mg/dL (ref 68–379)
IgG (Immunoglobin G), Serum: 591 mg/dL — ABNORMAL LOW (ref 650–1600)
IgM, Serum: 11 mg/dL — ABNORMAL LOW (ref 41–251)
IgM, Serum: 11 mg/dL — ABNORMAL LOW (ref 41–251)
Total Protein, Serum Electrophoresis: 6.4 g/dL (ref 6.0–8.3)
Total Protein, Serum Electrophoresis: 6.5 g/dL (ref 6.0–8.3)

## 2013-03-08 LAB — COPPER, SERUM: Copper: 108 ug/dL (ref 70–175)

## 2013-03-11 LAB — UIFE/LIGHT CHAINS/TP QN, 24-HR UR
Albumin, U: DETECTED
Alpha 1, Urine: DETECTED — AB
Beta, Urine: DETECTED — AB
Free Kappa Lt Chains,Ur: 1.71 mg/dL (ref 0.14–2.42)
Gamma Globulin, Urine: DETECTED — AB

## 2013-03-12 ENCOUNTER — Telehealth: Payer: Self-pay | Admitting: Neurology

## 2013-03-12 LAB — METHYLMALONIC ACID, SERUM: Methylmalonic Acid, Quant: 0.23 umol/L (ref <0.40)

## 2013-03-12 NOTE — Telephone Encounter (Signed)
Called patient with results of labs testing.  There is monoclonal IgG kappa protein present on serum IFE.  Paraproteinemias can cause neuropathy, however I would like for him to be evaluated by hematology/oncology for additional work-up.  He has requested that I contact Dr. Alvera Novel office to see if there is a provider he recommends.  Kaye Mitro K. Allena Katz, DO

## 2013-03-14 ENCOUNTER — Other Ambulatory Visit: Payer: Self-pay | Admitting: Cardiology

## 2013-03-21 ENCOUNTER — Telehealth: Payer: Self-pay | Admitting: Cardiology

## 2013-03-21 ENCOUNTER — Ambulatory Visit (INDEPENDENT_AMBULATORY_CARE_PROVIDER_SITE_OTHER): Payer: Medicare Other | Admitting: Neurology

## 2013-03-21 ENCOUNTER — Encounter: Payer: Self-pay | Admitting: Neurology

## 2013-03-21 VITALS — BP 118/72 | HR 62 | Temp 97.6°F | Ht 71.0 in | Wt 197.0 lb

## 2013-03-21 DIAGNOSIS — G629 Polyneuropathy, unspecified: Secondary | ICD-10-CM

## 2013-03-21 DIAGNOSIS — G609 Hereditary and idiopathic neuropathy, unspecified: Secondary | ICD-10-CM

## 2013-03-21 DIAGNOSIS — D472 Monoclonal gammopathy: Secondary | ICD-10-CM

## 2013-03-21 DIAGNOSIS — R42 Dizziness and giddiness: Secondary | ICD-10-CM

## 2013-03-21 NOTE — Patient Instructions (Signed)
1.  Hematology referral 2.  Tilt table evaluation 3.  Return to clinic in 21-month

## 2013-03-21 NOTE — Telephone Encounter (Signed)
New Prob     Calling to set up a tilt table study. Please call.

## 2013-03-21 NOTE — Progress Notes (Signed)
[facility] Neurology Division  Follow-up Visit   Date: 03/21/2013    Adrian Skeens Kipp Jr. MRN: [id number] DOB: [birth year]   Interim West: Adrian Skeens Tisdel Montez Hageman. West West 79 y.o. year-old left-handed Caucasian male with West of hypothyroidism, hypertension, GERD, essential tremor, hyperlipidemia, right C8-T1 radiculopathy, mitral valve prolapse s/p repair (2010) complicated by phrenic nerve injury causing chronic dyspnea (on 2L oxygen n.c. 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  The patient was accompanied to the clinic by self.  He was last seen in the office on 03/06/2013.  Since his last visit he West remained clinically unchanged. He continues to have lightheadedness especially with changing positions. Fortunately, he West not had any falls. Endorses dry eyes, early satiety, decreased appetite, and anhydrosis.   West of present illness: He was seeing Dr. Sandria Manly for 30+ years for peripheral neuropathy, left hand tremor, and vertigo. He was last seen in March 2014 at which time he was told that there was no additional therapies that could be offered. Adrian West of vertigo, described as room-spinning and intermittent. It West triggered by changes in position, such standing up or leaning over. Symptoms last about 2-minutes. It occurs about 6-10 times West day, depending on activity level. He West never fallen, but does hold on to objects. Rolling in bed or abrupt changes in head position does not worse these symptoms. No associated headaches, blurry vision, nausea, or vomiting.   Additionally, he reports "blacking out" over the past 2-3 years. There West no associated loss of consciousness with these spells, but he feels tired and weak. Spells generally last about an hour and there West no prodromal symptoms. In June, he passed out and again did not lose consciousness but continued to feel weak. He was admitted and  symptoms were attributed to dehydration.   He still plays golf. He exercises about 3x per week and uses West chair for stability.   Of note, he West had an episode of slurred speech in May 2012 which lasted several hours. This occurred in the setting of missing 2 days of his aspirin. He had West stroke workup which was negative and symptoms were attributed to possible TIA. MRI of the brain 10/20/2010 showed mild periventricular subcortical chronic small vessel disease. Vessel imaging was normal. He was briefly on Plavix but developed West rash so was maintained on aspirin.   Regarding his shortness of breath, his workup West included EMG (5 06-2010), CPK, VGCC, anti-Hu, anti-Ri, and acetylcholine receptor antibodies which West within normal. Etiology West most likely phrenic nerve injury.    Medications:  Current Outpatient Prescriptions on File Prior to Visit  Medication Sig Dispense Refill  . aspirin 81 MG tablet Take 81 mg by mouth daily.        Marland Kitchen atorvastatin (LIPITOR) 10 MG tablet TAKE ONE TABLET EACH DAY  30 tablet  6  . clopidogrel (PLAVIX) 75 MG tablet Take 75 mg by mouth daily.      Marland Kitchen levothyroxine (SYNTHROID, LEVOTHROID) 125 MCG tablet Take 125 mcg by mouth daily before breakfast.      . metoprolol succinate (TOPROL-XL) 25 MG 24 hr tablet Take 25 mg by mouth daily.      . Multiple Vitamin (MULTIVITAMIN WITH MINERALS) TABS tablet Take 1 tablet by mouth daily.      . NON FORMULARY Oxygen 2 LPM QHS      . sildenafil (VIAGRA) 100 MG tablet Take  1 tablet (100 mg total) by mouth daily as needed. Not to be taking with Nitroglycerin  30 tablet  0   No current facility-administered medications on file prior to visit.    Allergies: No Known Allergies   Review of Systems:  CONSTITUTIONAL: No fevers, chills, night sweats, or weight loss.   EYES: No visual changes or eye pain ENT: No hearing changes.  No West of nose bleeds.   RESPIRATORY: No cough, wheezing +shortness of breath.   CARDIOVASCULAR:  Negative for chest pain, and palpitations.   GI: Negative for abdominal discomfort, blood in stools or black stools.  No recent change in bowel habits.   GU:  No West of incontinence.   MUSCLOSKELETAL: No West of joint pain or swelling.  No myalgias.   SKIN: Negative for lesions, rash, and itching.   HEMATOLOGY/ONCOLOGY: Negative for prolonged bleeding, bruising easily, and swollen nodes.  No West of cancer.   ENDOCRINE: Negative for cold or heat intolerance, polydipsia or goiter.   PSYCH:  No depression or anxiety symptoms.   NEURO: As Above.   Vital Signs:  BP 118/72  Pulse 62  Temp(Src) 97.6 F (36.4 C)  Ht 5\' 11"  (1.803 m)  Wt 197 lb (89.359 kg)  BMI 27.49 kg/m2  Neurological Exam: MENTAL STATUS including orientation to time, place, person, recent and remote memory, attention span and concentration, language, and fund of knowledge West normal. Speech West not dysarthric.  CRANIAL NERVES:  II: No visual field defects. Unremarkable fundi.  III-IV-VI: Pupils equal round and reactive to light. Normal conjugate, extra-ocular eye movements in all directions of gaze. Physiological end-point nystagmus bilaterally. No ptosis prior to or post sustained upgaze.  V: Normal facial sensation. Jaw jerk West present.  VII: Normal facial symmetry and movements. Snout, palmomental reflexes, and Myerson's signs are present.  XII: Normal tongue strength and range of motion, no deviation or fasciculation.   MOTOR: Mild atrophy of intrinsic hand muscles bilaterally. Left > right postural hand tremor with low amplitude. Tremor West not present at rest. No fasciculations or pronator drift.  Right Upper Extremity:    Left Upper Extremity:    Deltoid  5/5   Deltoid  5/5   Biceps  5/5   Biceps  5/5   Triceps  5-/5   Triceps  5-/5   Wrist extensors  5/5   Wrist extensors  5/5   Wrist flexors  5/5   Wrist flexors  5/5   Finger extensors  5/5   Finger extensors  5/5   Finger flexors  5/5   Finger flexors   5/5   Dorsal interossei  4/5   Dorsal interossei  4/5   Abductor pollicis  5/5   Abductor pollicis  5/5   Tone (Ashworth scale)  0   Tone (Ashworth scale)  0    Right Lower Extremity:    Left Lower Extremity:    Hip flexors  5/5   Hip flexors  5/5   Hip extensors  5/5   Hip extensors  5/5   Knee flexors  5/5   Knee flexors  5/5   Knee extensors  5/5   Knee extensors  5/5   Dorsiflexors  5/5   Dorsiflexors  5/5   Plantarflexors  5-/5   Plantarflexors  5-/5   Toe extensors  4+/5   Toe extensors  4+/5   Toe flexors  5/5   Toe flexors  5/5   Tone (Ashworth scale)  0   Tone (Ashworth  scale)  0    MSRs:  Right Left  brachioradialis  2+   brachioradialis  2+   biceps  2+   biceps  2+   triceps  1+   triceps  1+   patellar  2+   patellar  2+   ankle jerk  0   ankle jerk  0   Hoffman  no   Hoffman  no   plantar response  down   plantar response  down    SENSORY: Vibration reduced to 50% at knees and absent distal to ankles bilaterally, proprioception at great toe West slightly impaired.  Romberg's sign West present.   COORDINATION/GAIT: Normal finger-to- nose-finger and heel-to-shin. Bradykinesia with reduced rate of finger tapping and toe tapping bilaterally (L >R) Able to rise from West chair without using arms. Slightly stooped posture with small steps, gait appears narrow and stable. He West unsteady with heel walking and tandem gait.   Data: MRI brain 10/22/2012:  No acute infarct.  Mild small vessel disease type changes.  Global atrophy without hydrocephalus.  No intracranial mass lesion detected on this unenhanced exam.  Polypoid complex opacification left maxillary sinus. Remodeling of the left alveolar ridge without change. CT imaging of the sinuses can be obtained for further delineation if clinically desired.   US carotids 10/11/2010: Mild intimal thickening bilaterally without significant plaque formation or stenosis.   Component     Latest Ref Rng 03/06/2013  Ceruloplasmin     20  - 60 mg/dL 32  Copper     70 - 161 mcg/dL 096  Methylmalonic Acid, Quant     <0.40 umol/L 0.23  Hemoglobin A1C     4.6 - 6.5 % 5.5  TSH     0.35 - 5.50 uIU/mL 2.00  Vitamin B-12     211 - 911 pg/mL 415   SPEP with IFE: Monoclonal IgG kappa protein West present Urine IFE:  No monoclonal free light chains (Bence Jones Protein) are detected.Urine IFE shows polyclonal increase in free Kappa and/or free Lambda light chains   IMPRESSION: Adrian West West West 79 year-old man presenting for evaluation of lightheadedness. He also West West West of peripheral neuropathy and essential tremors. His neuropathy workup West disclosed monoclonal IgG kappa gammopathy.  He  endorses symptoms of small fiber neuropathy including dysautonomia (orthostatic intolerance, early satiety, dry mouth).  Given that his primary complaint West lightheadedness, I would like to obtain West tilt table evaluation to determine if there, indeed, West orthostatic intolerance.  I do not feel his "vertigo" West originating from the inner ear and West more likely to be due to lack of proprioception and dysautonomia.  It West unclear to me at this time whether the primary etiology of symptoms his peripheral neuropathy or overlapping neurodegenerative condition such as parkinson-plus syndrome, specifically, multiple system atrophy. For this reason, I would like for him to be evaluated by hematology to address his monoclonal gammopathy, as certain paraproteinemias can be West treatable cause of neuropathy.   PLAN/RECOMMENDATIONS:  1.  Hematology referral 2.  Tilt table evaluation 3.  Pending results may consider trial of Florinef  4.  Return to clinic in 55-month  The duration of this appointment visit was 40 minutes of face-to-face time with the patient.  Greater than 50% of this time was spent in counseling, explanation of diagnosis, planning of further management, and coordination of care.   Thank you for allowing me to participate in patient's care.  If I  can answer  any additional questions, I would be pleased to do so.    Sincerely,    Isaah Furry K. Posey Pronto, DO

## 2013-03-21 NOTE — Telephone Encounter (Signed)
Spike with Sarah and advised patient needs ov with Dr Graciela Husbands per Tresa Endo before scheduling. Sent to [facility] to schedule

## 2013-03-25 ENCOUNTER — Other Ambulatory Visit: Payer: Self-pay

## 2013-03-25 DIAGNOSIS — D472 Monoclonal gammopathy: Secondary | ICD-10-CM

## 2013-03-25 NOTE — Telephone Encounter (Signed)
Patient aware of appointment and left message with Maralyn Sago at neurology

## 2013-03-25 NOTE — Telephone Encounter (Signed)
Sorry for the delay in responding. I recommend Dr. Cephas Darby at [facility]

## 2013-03-26 ENCOUNTER — Ambulatory Visit (INDEPENDENT_AMBULATORY_CARE_PROVIDER_SITE_OTHER): Payer: Medicare Other | Admitting: Internal Medicine

## 2013-03-26 ENCOUNTER — Encounter: Payer: Self-pay | Admitting: Internal Medicine

## 2013-03-26 VITALS — BP 133/78 | HR 75 | Ht 71.0 in | Wt 198.4 lb

## 2013-03-26 DIAGNOSIS — R42 Dizziness and giddiness: Secondary | ICD-10-CM

## 2013-03-26 DIAGNOSIS — I4949 Other premature depolarization: Secondary | ICD-10-CM

## 2013-03-26 DIAGNOSIS — I493 Ventricular premature depolarization: Secondary | ICD-10-CM

## 2013-03-26 DIAGNOSIS — I951 Orthostatic hypotension: Secondary | ICD-10-CM

## 2013-03-26 NOTE — Progress Notes (Signed)
Patient Care Team: Jacques Navy, MD as PCP - General (Internal Medicine) Provider Not In System as Consulting Physician Jacques Navy, MD as Attending Physician (Internal Medicine)   HPI  Adrian West. is a 79 y.o. male Referred from neurology for consideration of tilt table testing. He has a history of monoclonal gammopathy with symptoms of orthostasis early satiety and dry mouth.  He has a long-standing history of dizziness with standing and dizziness with bending. It does not occur while he is seated or lying down. He was followed for this for many years by Dr. love. Since Dr. love retirement he was referred to Dr. Allena Katz who has raised the question as to whether there is a unifying diagnosis related to the gammopathy.  He is described also set her peripheral neuropathy  hematology consultation is pending regarding the possibility of paraproteinemia  He has a history of "TIA", and is described spells, and he is lying where he was confused and weak. He has not had syncope. He has also had an episode of slurred speech evaluation was inconclusive. He has been treated with aspirin and  adjunctive Plavix.  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 Thyroid disease   . Hypertension   . GERD (gastroesophageal reflux disease)     PMH of stricture , recurrent;Dr Marina Goodell  . Benign neoplasm of colon   . Tremor, essential   . Diverticulosis   . TIA (transient ischemic attack) May 2012  . Skin cancer   . Neuropathy, peripheral   . Lung abnormality 11/25/2008    "nerve damage from heart surgery"  . Hyperlipidemia     Past Surgical History  Procedure Laterality Date  . Appendectomy    . Cataract extraction      Dr Dagoberto Ligas  . Cholecystectomy  1998    Dr [name]  . Inguinal hernia repair    . Lumbar laminectomy    . Nasal sinus surgery    .  Tonsillectomy    . Esophageal dilation       X3;Dr Marina Goodell  . 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 Laparoscopic ablation renal mass  08/14/2009  . Mohs surgery  2012     ear    Current Outpatient Prescriptions  Medication Sig Dispense Refill  . aspirin 81 MG tablet Take 81 mg by mouth daily.        Marland Kitchen atorvastatin (LIPITOR) 10 MG tablet TAKE ONE TABLET EACH DAY  30 tablet  6  . clopidogrel (PLAVIX) 75 MG tablet Take 75 mg by mouth daily.      Marland Kitchen levothyroxine (SYNTHROID, LEVOTHROID) 125 MCG tablet Take 125 mcg by mouth daily before breakfast.      . metoprolol succinate (TOPROL-XL) 25 MG 24 hr tablet Take 25 mg by mouth daily.      . Multiple Vitamin (MULTIVITAMIN WITH MINERALS) TABS tablet Take 1 tablet by mouth daily.      . NON FORMULARY Oxygen 2 LPM QHS      . sildenafil (VIAGRA) 100 MG tablet Take 1 tablet (100 mg total) by mouth daily as needed. Not to be taking with Nitroglycerin  30 tablet  0   No current facility-administered medications for this visit.    No Known Allergies  Review of  Systems negative except from HPI and PMH  Physical Exam BP 133/78  Pulse 75  Ht 5\' 11"  (1.803 m)  Wt 198 lb 6.4 oz (89.994 kg)  BMI 27.68 kg/m2 Well developed and well nourished in no acute distress HENT normal E scleral and icterus clear Neck Supple Without kyphosis or scoliosis JVP flat; carotids brisk and full Clear to ausculation  Regular rate and rhythm, no murmurs gallops or rub Soft with active bowel sounds No clubbing cyanosis none Edema Alert and oriented, grossly normal motor and sensory function Skin Warm and Dry  ECG dated 96 demonstrated sinus rhythm at 62 Intervals 21/14/45  Assessment and  Plan

## 2013-03-26 NOTE — Assessment & Plan Note (Signed)
While there was no objective measurements of orthostatic intolerance today when he was seen in our office in May there was a 50 mm drop in systolic blood pressure. There is a disconnect in part between his symptoms today and the lack of measurements but I suspect that Dr. Allena Katz is correct in that orthostatic hypotension may in fact be part of the picture.  Given the prior findings from a, I think, not withstanding the lack of evidence today, tilt testing would be reasonable.

## 2013-03-26 NOTE — Patient Instructions (Signed)
Dr. Graciela Husbands will discuss with Dr. Allena Katz about the next step in your plan of care - he will be in touch once he talks to [facility].

## 2013-03-28 ENCOUNTER — Telehealth: Payer: Self-pay | Admitting: Neurology

## 2013-03-28 ENCOUNTER — Ambulatory Visit: Payer: Medicare Other | Admitting: Internal Medicine

## 2013-03-28 NOTE — Telephone Encounter (Signed)
Returned call and left a voicemail for pt to call back if he still has questions.

## 2013-03-29 ENCOUNTER — Telehealth: Payer: Self-pay | Admitting: Internal Medicine

## 2013-03-29 NOTE — Telephone Encounter (Signed)
Let patient know I have emailed Dr Constance Goltz at [facility] if he does not hear from [place] or them in 2 weeks from 03/29/2013  to give [place] a call back  Thanks  Dr. Kalman Shan, M.D., [facility].C.P Pulmonary and Critical Care Medicine Staff Physician [facility] [place] [facility] Pager: [phone], If no answer or between  15:00h - 7:00h: call [phone]  03/29/2013 6:13 PM

## 2013-03-29 NOTE — Telephone Encounter (Signed)
I called [facility] to ask about this referral and they said they have it and plan to call him today.

## 2013-04-01 ENCOUNTER — Ambulatory Visit (INDEPENDENT_AMBULATORY_CARE_PROVIDER_SITE_OTHER): Payer: Medicare Other | Admitting: Internal Medicine

## 2013-04-01 ENCOUNTER — Encounter: Payer: Self-pay | Admitting: Internal Medicine

## 2013-04-01 VITALS — BP 138/88 | HR 61 | Wt 197.0 lb

## 2013-04-01 DIAGNOSIS — H6120 Impacted cerumen, unspecified ear: Secondary | ICD-10-CM

## 2013-04-01 DIAGNOSIS — R0609 Other forms of dyspnea: Secondary | ICD-10-CM

## 2013-04-01 DIAGNOSIS — D72820 Lymphocytosis (symptomatic): Secondary | ICD-10-CM

## 2013-04-01 DIAGNOSIS — I10 Essential (primary) hypertension: Secondary | ICD-10-CM

## 2013-04-01 DIAGNOSIS — H6121 Impacted cerumen, right ear: Secondary | ICD-10-CM

## 2013-04-01 NOTE — Assessment & Plan Note (Signed)
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 He is encouraged to continue nocturnal oxygen based on desaturation overnight by pulse oximetry. Explained how preventing hypoxemia reduces cardiac work load.   He is continuing with regimen per Buck Mam - including dietary restrictions and herbal and hot water foot soaks.   He has seen Dr. Allena Katz - opines that there is phrenic nerve damage.  Waiting to hear from Dr. Constance Goltz at [facility] - will call

## 2013-04-01 NOTE — Assessment & Plan Note (Signed)
BP Readings from Last 3 Encounters:  04/01/13 138/88  03/26/13 133/78  03/21/13 118/72   Good control. No change in medication

## 2013-04-01 NOTE — Progress Notes (Signed)
Subjective:    Patient ID: Adrian Quint., male    DOB: 14-Oct-1927, 79 y.o.   MRN: [id number]  HPI Presents for follow up. He has seen Dr. Allena Katz - neuro results reviewed. He has seen Dr. Graciela Husbands for possible orthostatic hyhpotension. He continues to see Buck Mam - has had 4 accupuncture treatments and a variety of herbal treatments that seem to help  As a side event he has cerumen impaction.   Past Medical History  Diagnosis Date  . Thyroid disease   . Hypertension   . GERD (gastroesophageal reflux disease)     PMH of stricture , recurrent;Dr Marina Goodell  . Benign neoplasm of colon   . Tremor, essential   . Diverticulosis   . TIA (transient ischemic attack) May 2012  . Skin cancer   . Neuropathy, peripheral   . Lung abnormality 11/25/2008    "nerve damage from heart surgery"  . Hyperlipidemia    Past Surgical History  Procedure Laterality Date  . Appendectomy    . Cataract extraction      Dr Dagoberto Ligas  . Cholecystectomy  1998    Dr [name]  . Inguinal hernia repair    . Lumbar laminectomy    . Nasal sinus surgery    . Tonsillectomy    . Esophageal dilation       X3;Dr Marina Goodell  . 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 Laparoscopic ablation renal mass  08/14/2009  . Mohs surgery  2012     ear   Family History  Problem Relation Age of Onset  . Heart attack Father 44  . Colon cancer Mother 51  . Heart attack Maternal Grandmother 60  . Uterine cancer Paternal Grandmother   . Stroke Neg Hx   . Diabetes Neg Hx   . Healthy Sister    History   Social History  . Marital Status: Married    Spouse Name: N/A    Number of Children: N/A  . Years of Education: N/A   Occupational History  . Not on file.   Social History Main Topics  . Smoking status: Never Smoker   . Smokeless tobacco: Never Used  . Alcohol Use: 0.0 oz/week     Comment: Drinks wine 3 per night  . Drug Use: No  . Sexual Activity: Not on  file   Other Topics Concern  . 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   Married x 53 years.  They have three daughters.       Review of Systems System review is negative for any constitutional, cardiac, pulmonary, GI or neuro symptoms or complaints other than as described in the HPI.     Objective:   Physical Exam Filed Vitals:   04/01/13 0940  BP: 138/88  Pulse: 61   Wt Readings from Last 3 Encounters:  04/01/13 197 lb (89.359 kg)  03/26/13 198 lb 6.4 oz (89.994 kg)  03/21/13 197 lb (89.359 kg)   Gen'l- older man in no distress but does have chronically rapid shallow breathing HEENT- subtotal cerumen impaction bilaterally which cleared with irrigation PUlm - normal oxygen saturation.   Procedure Note :     Procedure :  Ear irrigation   Indication:  Cerumen impaction   Risks, including pain, dizziness, eardrum perforation, bleeding, infection and others as well as benefits were explained to the patient  in detail. Verbal consent was obtained and the patient agreed to proceed.    We used ear syringe filled with lukewarm water for irrigation. A large amount wax was recovered.   Tolerated well. Complications: None.   Postprocedure instructions :  Call if problems.       Assessment & Plan:

## 2013-04-01 NOTE — Assessment & Plan Note (Signed)
Scheduled to see Dr. Cyndie Chime in early November.

## 2013-04-03 NOTE — Telephone Encounter (Signed)
ATC home number x 3, line was busy. ATC cell number, message stated wireless customer not available. WCB. [name], CMA

## 2013-04-05 ENCOUNTER — Ambulatory Visit: Payer: Self-pay | Admitting: Neurology

## 2013-04-09 NOTE — Telephone Encounter (Signed)
I spoke with the pt and advised. He has not heard from them yet so I advised 2 weeks would be Friday. I advised if he has not heard by then, then he needs to call us back. [name], CMA

## 2013-04-10 ENCOUNTER — Encounter: Payer: Self-pay | Admitting: *Deleted

## 2013-04-10 ENCOUNTER — Telehealth: Payer: Self-pay | Admitting: *Deleted

## 2013-04-10 NOTE — Telephone Encounter (Signed)
Called patient to set up tilt table testing. Procedure scheduled for 05/01/13 at 10:30 am. Patient was leaving for a doctor appointment and could not discuss instructions, agreement made for me to call back tomorrow to discuss details/instructions. Patient agreeable to plan.

## 2013-04-11 ENCOUNTER — Telehealth: Payer: Self-pay | Admitting: Internal Medicine

## 2013-04-11 ENCOUNTER — Encounter: Payer: Self-pay | Admitting: Oncology

## 2013-04-11 ENCOUNTER — Other Ambulatory Visit: Payer: Self-pay | Admitting: Oncology

## 2013-04-11 DIAGNOSIS — D472 Monoclonal gammopathy: Secondary | ICD-10-CM

## 2013-04-11 HISTORY — DX: Monoclonal gammopathy: D47.2

## 2013-04-11 NOTE — Telephone Encounter (Signed)
New Problem  Pt calling to have the Cath appointment resch// please call

## 2013-04-11 NOTE — Telephone Encounter (Signed)
Discussed procedure dates/times/instructions with patient. Patient verbalized understanding of instructions.

## 2013-04-12 NOTE — Telephone Encounter (Signed)
Patient returned call, he requested not to be scheduled as first case. I explained that I would be in touch with a new date and time. He requested detailed message to be left on his machine with new date/time. I agreed to let him know.

## 2013-04-12 NOTE — Telephone Encounter (Signed)
Left message for patient to reschedule tilt table testing.

## 2013-04-15 ENCOUNTER — Encounter: Payer: Self-pay | Admitting: *Deleted

## 2013-04-15 ENCOUNTER — Telehealth: Payer: Self-pay | Admitting: Oncology

## 2013-04-15 NOTE — Telephone Encounter (Signed)
Forwarded 11/6 pof to HIM for new pt lb

## 2013-04-15 NOTE — Telephone Encounter (Signed)
Scheduled tilt table testing for 05/06/13 at 9:00 am, patient to arrive at hospital at 7:00 am. Patient verbalized understanding and agreeable to plan. Letter of instructions mailed to patient.

## 2013-04-16 ENCOUNTER — Telehealth: Payer: Self-pay | Admitting: Oncology

## 2013-04-16 NOTE — Telephone Encounter (Signed)
PER MD LAB ON 11/07 ORDER JUST RECEIVED ON 11/10  LAB ORDER WAS PLACED ON ONCOLOGY TREATMENT DETAIL NOT EVEN TO NP SCHEDULERS. LAB SCHEDULED FOR 11/12 @ 9:45

## 2013-04-17 ENCOUNTER — Other Ambulatory Visit (HOSPITAL_BASED_OUTPATIENT_CLINIC_OR_DEPARTMENT_OTHER): Payer: Medicare Other | Admitting: Lab

## 2013-04-17 ENCOUNTER — Ambulatory Visit (HOSPITAL_BASED_OUTPATIENT_CLINIC_OR_DEPARTMENT_OTHER): Payer: Medicare Other

## 2013-04-17 ENCOUNTER — Ambulatory Visit (HOSPITAL_BASED_OUTPATIENT_CLINIC_OR_DEPARTMENT_OTHER): Payer: Medicare Other | Admitting: Oncology

## 2013-04-17 ENCOUNTER — Encounter: Payer: Self-pay | Admitting: Oncology

## 2013-04-17 VITALS — BP 128/79 | HR 68 | Temp 96.8°F | Resp 17 | Ht 71.0 in | Wt 196.0 lb

## 2013-04-17 DIAGNOSIS — D472 Monoclonal gammopathy: Secondary | ICD-10-CM

## 2013-04-17 DIAGNOSIS — R0902 Hypoxemia: Secondary | ICD-10-CM

## 2013-04-17 DIAGNOSIS — D751 Secondary polycythemia: Secondary | ICD-10-CM

## 2013-04-17 LAB — CBC WITH DIFFERENTIAL/PLATELET
BASO%: 0.6 % (ref 0.0–2.0)
Basophils Absolute: 0 10*3/uL (ref 0.0–0.1)
EOS%: 2.7 % (ref 0.0–7.0)
HCT: 51.8 % — ABNORMAL HIGH (ref 38.4–49.9)
HGB: 17.5 g/dL — ABNORMAL HIGH (ref 13.0–17.1)
LYMPH%: 18.6 % (ref 14.0–49.0)
MCH: 31.7 pg (ref 27.2–33.4)
MCHC: 33.8 g/dL (ref 32.0–36.0)
MCV: 94 fL (ref 79.3–98.0)
MONO%: 7.5 % (ref 0.0–14.0)
NEUT%: 70.6 % (ref 39.0–75.0)
Platelets: 197 10*3/uL (ref 140–400)

## 2013-04-17 LAB — MORPHOLOGY

## 2013-04-17 NOTE — Progress Notes (Signed)
New Patient Hematology-Oncology Evaluation   Adrian West [id number] 10-03-1927 79 y.o. 04/17/2013  CC: Dr. Illene Regulus; Dr. Marchelle Gearing; Dr. Allena Katz neurology; Dr. 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 He was followed for many years by Dr. Rosanne Ashing love for a resting tremor, idiopathic peripheral neuropathy, and history of recurrent TIAs. He also has chronic vertigo which is usually positional. He saw my partner Dr. Clelia Croft 3 years ago in March 2011 for the same reason that he is referred back again today. As a workup for his neuropathy,  serum protein electrophoresis was done on 08/04/2009. There was no M spike on serum protein electrophoresis but there was monoclonal IgG kappa paraprotein on immunofixation electrophoresis. He had normal total IgG and IgA with a decreased IgM. A 24-hour urine collection was done which showed only 21 mg of protein and no monoclonal free light chains on IFE. Repeat values done 03/06/2013 showed nearly identical results with total IgG 600, IgA 78, and IgM 11 mg percent. Serum total protein 6.4. No M spike on SPEP. Presence of trace amount of IgG kappa monoclonal protein on IFE. No monoclonal proteins on UFE, . 2.3 mg percent protein on a spot urine.  Looking at his CBCs over the last few years, he is actually polycythemic and not anemic! Hemoglobins run as high as 17 g with normal white count, differential, and platelet count. His history includes surgery on a prolapsed mitral valve in 2010. Apparently there was injury to the phrenic nerve resulting in chronic paralysis of the right hemidiaphragm. He tells me he has been followed by Dr. Marchelle Gearing and that his lung function has been severely compromised. My guess is that he is chronically hypoxic and has secondary polycythemia. He is a never  smoker.  His neuropathy is mild affecting primarily the feet but also of the hands. He is not aware of any kidney dysfunction. BUN and creatinine recorded on 10/22/2012 were normal at 20 and 1.0 respectively. Serum calcium normal at 9.3 at that time. He has had vitamin B12, folic acid levels, methylmalonic acid levels, and ceruloplasmin levels checked and they have been normal. He has some mild degenerative arthritis primarily in his shoulders. He was very athletic in the past and play a lot of tennis. He has no focal bone pain.  He has no history of exposure to organic chemicals, lead, or therapeutic radiation. There is no family history of any blood disorder.   PMH: Past Medical History  Diagnosis Date  . Thyroid disease   . Hypertension   . GERD (gastroesophageal reflux disease)     PMH of stricture , recurrent;Dr Marina Goodell  . Benign neoplasm of colon   . Tremor, essential   . Diverticulosis   . TIA (transient ischemic attack) May 2012  . Skin cancer   . Neuropathy, peripheral   . Lung abnormality 11/25/2008    "nerve damage from heart surgery"  . Hyperlipidemia   . MGUS (monoclonal gammopathy of unknown significance) 04/11/2013  No history of tuberculosis, hepatitis, yellow jaundice, malaria, kidney stones or kidney problems. Prostate problems. Inflammatory arthritis.  Past Surgical History  Procedure Laterality Date  . Appendectomy    . Cataract extraction      Dr Dagoberto Ligas  . Cholecystectomy  1998    Dr [name]  . Inguinal hernia repair    . Lumbar laminectomy    .  Nasal sinus surgery    . Tonsillectomy    . Esophageal dilation       X3;Dr Marina Goodell  . 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 Laparoscopic ablation renal mass  08/14/2009  . Mohs surgery  2012     ear    Allergies: No Known Allergies  Medications: Aspirin 81 mg by mouth daily, Lipitor 10 mg daily, Plavix 75 mg daily, Synthroid 125 mcg daily,  Toprol-XL 25 mg daily, multivitamins with iron one daily, oxygen 2 L which she uses at night, Viagra 100 mg when necessary.   Social History:   reports that he has never smoked. He has never used smokeless tobacco. He reports that he drinks alcohol. He reports that he does not use illicit drugs.  Family History: Family History  Problem Relation Age of Onset  . Heart attack Father 67  . Colon cancer Mother 27  . Heart attack Maternal Grandmother 60  . Uterine cancer Paternal Grandmother   . Stroke Neg Hx   . Diabetes Neg Hx   . Healthy Sister     Review of Systems: Hematology: negative for swollen glands, easy bruising, ENT ROS: negative for - oral lesions or sore throat Breast ROS:  Respiratory ROS: negative for - cough, pleuritic pain, chronic shortness of breath on home oxygen when necessary. Paralyzed hemidiaphragm. Cardiovascular ROS: negative for - chest pain, dyspnea on exertion, edema, irregular heartbeat, murmur, orthopnea, palpitations, paroxysmal nocturnal dyspnea or rapid heart rate Gastrointestinal ROS: negative for - abdominal pain, appetite loss, blood in stools, change in bowel habits, constipation, diarrhea, heartburn, hematemesis, melena, nausea/vomiting or swallowing difficulty/pain Genito-Urinary ROS: negative for -dysuria, hematuria, incontinence,  nocturia or urinary frequency/urgency Musculoskeletal ROS: Other than degenerative arthritis of the shoulders, negative for - joint pain, joint stiffness, joint swelling, muscle pain, muscular weakness or pain  Neurological ROS: Positive for dizziness, and distal paresthesias. Benign resting tremor left hand. Dermatological ROS: negative for rash, ecchymosis Remaining ROS negative.  Physical Exam: Blood pressure 128/79, pulse 68, temperature 96.8 F (36 C), temperature source Oral, resp. rate 17, height 5\' 11"  (1.803 m), weight 196 lb (88.905 kg), SpO2 95.00%. Wt Readings from Last 3 Encounters:  04/17/13 196 lb  (88.905 kg)  04/01/13 197 lb (89.359 kg)  03/26/13 198 lb 6.4 oz (89.994 kg)     General appearance: Well-nourished Caucasian man HENNT: Pharynx no erythema, exudate, mass, or ulcer. No thyromegaly or thyroid nodules Lymph nodes: No cervical, supraclavicular, or axillary lymphadenopathy Breasts: Lungs: Clear to auscultation, resonant to percussion throughout Heart: Regular rhythm, no murmur, no gallop, no rub, no click, no edema Abdomen: Soft, nontender, normal bowel sounds, no mass, no organomegaly Extremities: No edema, no calf tenderness Musculoskeletal: no joint deformities GU: Vascular: Carotid pulses 2+, no bruits, he has cyanosis of his fingers bilaterally Neurologic: Alert, oriented, PERRLA, optic discs sharp and vessels normal, no hemorrhage or exudate, cranial nerves grossly normal, motor strength 5 over 5, reflexes 1+ symmetric, upper body coordination normal, gait normal, vibration minimally decreased by tuning fork exam over the fingertips. Sensation better right hand compared with left. Resting tremor of the left hand. Skin: No rash or ecchymosis    Lab Results: Lab Results  Component Value Date   WBC 5.2 04/17/2013   HGB 17.5* 04/17/2013   HCT 51.8* 04/17/2013   MCV 94.0 04/17/2013   PLT 197 04/17/2013     Chemistry      Component  Value Date/Time   NA 138 10/22/2012 1212   K 4.8 10/22/2012 1212   CL 102 10/22/2012 1212   CO2 28 10/22/2012 1212   BUN 20 10/22/2012 1212   CREATININE 1.0 10/22/2012 1212   CREATININE 1.20 10/12/2010 1257      Component Value Date/Time   CALCIUM 9.3 10/22/2012 1212   ALKPHOS 48 09/20/2010 1040   AST 20 09/20/2010 1040   ALT 19 09/20/2010 1040   BILITOT 1.0 09/20/2010 1040       Impression and Plan: #1. Chronic, stable, monoclonal gammopathy of undetermined significance with only manifestation being a trace amount of IgG kappa paraprotein on immunofixation electrophoresis. This is a normal finding in a man his age and requires no  further evaluation. In my opinion it is totally unrelated to his distal neuropathy.  #2. 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 I'm going to document that this is a secondary problem by obtaining a serum erythropoietin level which I expect to be elevated.  Patient is reassured that he does not have a malignant hematologic condition. Formal followup visit not scheduled.      Levert Feinstein, MD 04/17/2013, 12:55 PM

## 2013-04-17 NOTE — Progress Notes (Signed)
Checked in new patient with no financial issues. He wants report to go to dr patel also.

## 2013-04-18 LAB — KAPPA/LAMBDA LIGHT CHAINS
Kappa free light chain: 1.29 mg/dL (ref 0.33–1.94)
Kappa:Lambda Ratio: 1.7 — ABNORMAL HIGH (ref 0.26–1.65)
Lambda Free Lght Chn: 0.76 mg/dL (ref 0.57–2.63)

## 2013-04-18 LAB — ERYTHROPOIETIN: Erythropoietin: 6 m[IU]/mL (ref 2.6–18.5)

## 2013-04-19 ENCOUNTER — Telehealth: Payer: Self-pay | Admitting: *Deleted

## 2013-04-19 NOTE — Telephone Encounter (Signed)
Pt called requesting follow up on treatment at [facility] in [place].  Please advise

## 2013-04-22 ENCOUNTER — Telehealth: Payer: Self-pay | Admitting: Neurology

## 2013-04-22 NOTE — Telephone Encounter (Signed)
Pt has appt 04/24/13. He wants to know if he really needs to come back, does he need to see Neuro again. He has several other appts and medical issues going on right now. Is neuro f/u truly necessary?  / Sherri

## 2013-04-22 NOTE — Telephone Encounter (Signed)
Patient was last seen 03-21-2013. Please advise

## 2013-04-23 ENCOUNTER — Encounter (HOSPITAL_COMMUNITY): Payer: Self-pay

## 2013-04-23 NOTE — Telephone Encounter (Signed)
If he has no new complaints, I would be happy to see him again in 40-months.    Marieme Mcmackin K. Allena Katz, DO

## 2013-04-23 NOTE — Telephone Encounter (Signed)
Pt will follow up in December. He wants to come back to Dr. Allena Katz after his tilt table test  Roanna Raider

## 2013-04-24 ENCOUNTER — Ambulatory Visit: Payer: Medicare Other | Admitting: Neurology

## 2013-04-25 ENCOUNTER — Telehealth: Payer: Self-pay | Admitting: Internal Medicine

## 2013-04-25 NOTE — Telephone Encounter (Signed)
04/25/2013   Pt called in wanting to know if Dr. Debby Bud had spoke with Dr. Constance Goltz, [facility].  Pt also wants to know what Dr. Debby Bud thinks he should do in regards to still seeing Dr. Constance Goltz.  Please contact pt to advise.

## 2013-04-29 NOTE — Telephone Encounter (Signed)
Spoke with Dr. Constance Goltz Friday, 11/21. She states that she, in cooperation with cardio-vascular surgeon, do treat symptomatic patients with paralyzed hemi-diaphragm. She does a full evaluation of pulmonary and cardiac function and if the primary problem, in a setting of good pulmonary function and oxygenation, in the absence of active heart disease, followed by surgical plication of the hemidiaphragm.  Spoke with Adrian West Monday, 11/24 - gave this information. He believes he will move forward and schedule an appoint with Dr. Constance Goltz in [place].

## 2013-05-01 ENCOUNTER — Encounter (HOSPITAL_COMMUNITY): Admission: RE | Payer: Self-pay | Source: Ambulatory Visit

## 2013-05-01 ENCOUNTER — Ambulatory Visit (HOSPITAL_COMMUNITY): Admission: RE | Admit: 2013-05-01 | Payer: Medicare Other | Source: Ambulatory Visit | Admitting: Internal Medicine

## 2013-05-01 SURGERY — TILT TABLE STUDY
Anesthesia: LOCAL

## 2013-05-06 ENCOUNTER — Telehealth: Payer: Self-pay

## 2013-05-06 ENCOUNTER — Encounter (HOSPITAL_COMMUNITY)
Admission: RE | Disposition: A | Discharge status: Home / Self Care | Payer: Self-pay | Source: Ambulatory Visit | Attending: Internal Medicine

## 2013-05-06 ENCOUNTER — Ambulatory Visit (HOSPITAL_COMMUNITY)
Admission: RE | Admit: 2013-05-06 | Discharge: 2013-05-06 | Disposition: A | Discharge status: Home / Self Care | Payer: Medicare Other | Source: Ambulatory Visit | Attending: Internal Medicine | Admitting: Internal Medicine

## 2013-05-06 DIAGNOSIS — I10 Essential (primary) hypertension: Secondary | ICD-10-CM | POA: Insufficient documentation

## 2013-05-06 DIAGNOSIS — R42 Dizziness and giddiness: Secondary | ICD-10-CM | POA: Insufficient documentation

## 2013-05-06 DIAGNOSIS — E785 Hyperlipidemia, unspecified: Secondary | ICD-10-CM | POA: Insufficient documentation

## 2013-05-06 HISTORY — PX: TILT TABLE STUDY: SHX5493

## 2013-05-06 SURGERY — TILT TABLE STUDY
Anesthesia: LOCAL

## 2013-05-06 NOTE — CV Procedure (Signed)
Adrian Lotts Cogar Jr. [id number]  [id number]  Preop Dx: postural lightheadedness Postop Dx same/   Procedure: tilt Cx: None   Pt was equilibrated in the supine position  BP 130 HR 70s Tilted at 70 degrees for 30 minutes without significant sustained change in HR/BP  There was flucutation of BP into 110s and HR..90 (100+ were with PACs)  Non diagnostic test Given history though have suggested that he consider orthostatic maneuvers and or compression hose(waist high-20-30 mm) to see if empiric therapy assoc with improvement   Sherryl Manges, MD 05/06/2013 11:07 AM

## 2013-05-06 NOTE — Telephone Encounter (Signed)
Phone call from patient giving an FYI that [facility] is faxing a request for info on this patient.

## 2013-05-06 NOTE — H&P (Signed)
       Patient Care Team: Glendale Chard, MD as PCP - General (Neurology) Provider Not In System as Consulting Physician Jacques Navy, MD as Attending Physician (Internal Medicine)   HPI  [facility]. is a 79 y.o. 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 Thyroid disease   . Hypertension   . GERD (gastroesophageal reflux disease)     PMH of stricture , recurrent;Dr Marina Goodell  . Benign neoplasm of colon   . Tremor, essential   . Diverticulosis   . TIA (transient ischemic attack) May 2012  . Skin cancer   . Neuropathy, peripheral   . Lung abnormality 11/25/2008    "nerve damage from heart surgery"  . Hyperlipidemia   . MGUS (monoclonal gammopathy of unknown significance) 04/11/2013    Past Surgical History  Procedure Laterality Date  . Appendectomy    . Cataract extraction      Dr Dagoberto Ligas  . Cholecystectomy  1998    Dr [name]  . Inguinal hernia repair    . Lumbar laminectomy    . Nasal sinus surgery    . Tonsillectomy    . Esophageal dilation       X3;Dr Marina Goodell  . 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 Laparoscopic ablation renal mass  08/14/2009  . Mohs surgery  2012     ear    No current facility-administered medications for this encounter.    No Known Allergies  Review of Systems negative except from HPI and PMH  Physical Exam There were no vitals taken for this visit. Well developed and well nourished in no acute distress HENT normal E scleral and icterus clear Neck Supple JVP flat; carotids brisk and full Clear to ausculation  Regular rate and rhythm, no  murmurs gallops or rub Soft with active bowel sounds No clubbing cyanosis none Edema Alert and oriented, grossly normal motor and sensory function Skin Warm and Dry  ECG RBBB borderline 1AAVB  Assessment and  Plan  Orthostatic symptoms  Nl LV function with LAD  S/P MVR with paralyzed Hemidiaphragm  For tilt table test today  Procedure reviewed

## 2013-05-13 ENCOUNTER — Encounter: Payer: Self-pay | Admitting: Internal Medicine

## 2013-05-13 ENCOUNTER — Ambulatory Visit (INDEPENDENT_AMBULATORY_CARE_PROVIDER_SITE_OTHER): Payer: Medicare Other | Admitting: Internal Medicine

## 2013-05-13 VITALS — BP 120/70 | HR 65 | Ht 71.0 in | Wt 198.0 lb

## 2013-05-13 DIAGNOSIS — R0609 Other forms of dyspnea: Secondary | ICD-10-CM

## 2013-05-13 NOTE — Patient Instructions (Signed)
Please ensure you followup at [facility] My CMA wil make appointment for your wife [place] [facility] at [facility] (dr Fredda Hammed) REturn to see me in 3 months for update

## 2013-05-13 NOTE — Progress Notes (Signed)
   Subjective:    Patient ID: Dutch Quint., male    DOB: December 18, 1927, 79 y.o.   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 No improvement with rehab.  2. Restricted PFTs out of pproportion to above. Seen by Dr. Katrine Coho at [facility] 2011 - suspects associated loss of height and ? etoh neuropathy (drinks 1-3 glasses wine nightly) playing a role.  Did not recommend surgical plication - was evaluated by CVTS at [facility]  OV 02/11/2013 Dyspnea followup  -I have not seen him in 11 months. Since then dyspnea persists unchanged. In the last 30 days he has tried nocturnal oxygen presumably on an empiric basis without any overnight desaturation test. However, this is not helping dyspnea. He is even tried one week of morphine oral and this is not helping dyspnea either. Associated fatigue present. Dyspnea is impacting quality of life. His wife is urging him to seek a third opinion possibly at [facility]. He is not so sure about it. Also at last visit I recommended incentive spirometry and weight loss but he has not done either of those and he forgot about it  - Of note, 2 days ago went to the emergency department with left shoulder pain and was diagnosed to have left glenohumeral arthritis. I do notice that no cardiac enzyme markers were done   OV 05/13/2013  - STill no change in dyspnea. Now in touch with [facility] aftter much delay (he did not answer their phone). HE tells mehis pcp Dr Debby Bud spoke directly to Dr Fredda Hammed at [facility] and has been told that diaphragmatic plication could help him. THis call is on chart 04/26/13/ He has tons of repeat questions on the surgery and outcomes and complication. I had to remind him for each question that is best ansered at [facility]. Oerall dyspnea unchanged in 4 years. Acupuncture is not helping. Seen neuro PATEL, DONIKA, MD - some additional tests ordered for ? MGUS but results  unknown to me. I have encouraged him   Review of Systems  Constitutional: Negative for fever and unexpected weight change.  HENT: Negative for congestion, dental problem, ear pain, nosebleeds, postnasal drip, rhinorrhea, sinus pressure, sneezing, sore throat and trouble swallowing.   Eyes: Negative for redness and itching.  Respiratory: Positive for shortness of breath. Negative for cough, chest tightness and wheezing.   Cardiovascular: Negative for palpitations and leg swelling.  Gastrointestinal: Negative for nausea and vomiting.  Genitourinary: Negative for dysuria.  Musculoskeletal: Negative for joint swelling.  Skin: Negative for rash.  Neurological: Negative for headaches.  Hematological: Does not bruise/bleed easily.  Psychiatric/Behavioral: Negative for dysphoric mood. The patient is not nervous/anxious.        Objective:   Physical Exam  Filed Vitals:   05/13/13 0910  BP: 120/70  Pulse: 65  Height: 5\' 11"  (1.803 m)  Weight: 198 lb (89.812 kg)  SpO2: 94%   Brief exam - alert and oriented x 3  huffing and puffing as always  - decreased BS on right     Assessment & Plan:

## 2013-05-14 NOTE — Assessment & Plan Note (Signed)
Please ensure you followup at [facility] My CMA wil make appointment for your wife Ms [place] at [facility] (dr Fredda Hammed) REturn to see me in 3 months for update  All questions answered  > 50% of this > 25 min visit spent in face to face counseling (15 min visit converted to 25 min)

## 2013-05-15 ENCOUNTER — Telehealth: Payer: Self-pay | Admitting: Internal Medicine

## 2013-05-15 NOTE — Telephone Encounter (Signed)
.  Got a  Cal lfrom Engineering geologist at [facility]. [phone]. She said she called [place] Quint.  insurance carrier and they said that he either needed to get his care in [place] or transfer his insurance policy to [place].  If he wants to transfer his policy, he would need to call customer service at his insurance carriers office. But then if takes a Development worker, community he needs to check with them if he can continue to get care in [place].  Apparently Boneta Lucks  asked if the patient could get an authorization butAmanda at [place], said no.  His plan would not cover it.    So,  he needs to decice what he wants to do.   Dr. Kalman Shan, M.D., [facility].C.P Pulmonary and Critical Care Medicine Staff Physician [facility]  Pulmonary and Critical Care Pager: [phone], If no answer or between  15:00h - 7:00h: call [phone]  05/15/2013 4:59 PM

## 2013-05-16 ENCOUNTER — Telehealth: Payer: Self-pay | Admitting: Internal Medicine

## 2013-05-16 NOTE — Telephone Encounter (Signed)
Have not. Please google [facility] clinic, Dr. Constance Goltz, call and be sure they have our correct fax number.

## 2013-05-16 NOTE — Telephone Encounter (Signed)
Received another message from [facility]. Transferred the message to medical records. They are needing a lot of information and requesting it on a disc. This is something medical records should handle.

## 2013-05-16 NOTE — Telephone Encounter (Signed)
I called Dr Lonell Face office at [facility] in [place]. I had to leave a message and someone will call back within an hour I am told.

## 2013-05-16 NOTE — Telephone Encounter (Signed)
Patient wanted to check to see if Dr. Debby Bud had received information form the [facility] in [place] [facility] to complete.  Clinic states they have sent forms twice.  Please call in regards.

## 2013-05-17 ENCOUNTER — Ambulatory Visit: Payer: Medicare Other | Admitting: Neurology

## 2013-05-21 ENCOUNTER — Ambulatory Visit (INDEPENDENT_AMBULATORY_CARE_PROVIDER_SITE_OTHER): Payer: Medicare Other | Admitting: Neurology

## 2013-05-21 ENCOUNTER — Ambulatory Visit: Payer: Medicare Other | Admitting: Neurology

## 2013-05-21 ENCOUNTER — Encounter: Payer: Self-pay | Admitting: Neurology

## 2013-05-21 VITALS — BP 140/72 | HR 70 | Ht 71.0 in | Wt 199.3 lb

## 2013-05-21 DIAGNOSIS — G629 Polyneuropathy, unspecified: Secondary | ICD-10-CM

## 2013-05-21 DIAGNOSIS — G609 Hereditary and idiopathic neuropathy, unspecified: Secondary | ICD-10-CM

## 2013-05-21 DIAGNOSIS — R42 Dizziness and giddiness: Secondary | ICD-10-CM

## 2013-05-21 NOTE — Patient Instructions (Addendum)
1. Start alpha-lipoic acid 600mg  daily 2. Trial of knee high stockings recommended, if patient tolerating 3. Return to clinic 33-months

## 2013-05-21 NOTE — Progress Notes (Signed)
[facility] Neurology Division  Follow-up Visit   Date: 05/21/2013    Adrian Skeens Fluty Jr. MRN: [id number] DOB: 1927-10-09   Interim History: Adrian Skeens Shed Montez Hageman. is a 79 y.o. year-old left-handed Caucasian male with history of hypothyroidism, hypertension, GERD, essential tremor, hyperlipidemia, MGUS, right C8-T1 radiculopathy, and mitral valve prolapse s/p repair (2010) complicated by right phrenic nerve injury causing chronic dyspnea (on 2L oxygen n.c. at night) returning to the clinic for follow-up of vertigo and peripheral neuropathy.  The patient was accompanied to the clinic by self.  He was last seen in the office on 03/21/2013.   He remains clinically unchanged regarding his peripheral neuropathy, left hand tremors, and dizziness. He has not had any falls. Since his last visit, he has been evaluated by Dr. Cyndie Chime whose work-up for consistent with MGUS and Dr. Graciela Husbands performed tilt tablet which did not show evidence of othostasis.  He recommended using waist high stockings, but did not try this.  Of note, his more concerning symptoms is chronic dyspnea due to elevated hemidiaphragm from right phrenic nerve injury following MVR.  He is planning on having a consultation at [place] in [place] by Dr. [place]da [place] to see if he is a candidate for surgical intervention of his right phrenic injury.    History of present illness: He was seeing Dr. Sandria Manly for 30+ years for peripheral neuropathy, left hand tremor, and vertigo. He was last seen in March 2014 at which time he was told that there was no additional therapies that could be offered. [place] has a 10-year history of vertigo, described as lightheadedness and intermittent. It is triggered by changes in position, such standing up or leaning over. Symptoms last about 2-minutes. It occurs about 6-10 times a day, depending on activity level. He has never fallen, but does hold on to objects. Rolling in bed or  abrupt changes in head position does not worse these symptoms.   Additionally, he reports "blacking out" over the past 2-3 years. There is no associated loss of consciousness with these spells, but he feels tired and weak. Spells generally last about an hour and there is no prodromal symptoms. In June, he passed out and again did not lose consciousness but continued to feel weak. He was admitted and symptoms were attributed to dehydration.   He still plays golf. He exercises about 3x per week and uses a chair for stability.   Of note, he has had an episode of slurred speech in May 2012 which lasted several hours. This occurred in the setting of missing 2 days of his aspirin. He had a stroke workup which was negative and symptoms were attributed to possible TIA. MRI of the brain 10/20/2010 showed mild periventricular subcortical chronic small vessel disease. Vessel imaging was normal. He was briefly on Plavix but developed a rash so was maintained on aspirin.   Regarding his shortness of breath, his workup has included EMG (5 06-2010), CPK, VGCC, anti-Hu, anti-Ri, and acetylcholine receptor antibodies which is within normal. Etiology is most likely phrenic nerve injury.   Medications:  Current Outpatient Prescriptions on File Prior to Visit  Medication Sig Dispense Refill  . ALPRAZolam (XANAX) 1 MG tablet Take 1 tablet by mouth 3 (three) times daily as needed.      Marland Kitchen aspirin EC 81 MG tablet Take 81 mg by mouth daily.      Marland Kitchen atorvastatin (LIPITOR) 10 MG tablet Take 10 mg by mouth daily.      Marland Kitchen  clopidogrel (PLAVIX) 75 MG tablet Take 75 mg by mouth daily with breakfast.       . levothyroxine (SYNTHROID, LEVOTHROID) 125 MCG tablet Take 125 mcg by mouth daily before breakfast.      . metoprolol succinate (TOPROL-XL) 25 MG 24 hr tablet Take 25 mg by mouth daily.      . Multiple Vitamin (MULTIVITAMIN WITH MINERALS) TABS tablet Take 1 tablet by mouth daily.      . naproxen sodium (ANAPROX) 220 MG tablet  Take 220 mg by mouth daily as needed (pain).      . NON FORMULARY Oxygen 2 LPM QHS      . sildenafil (VIAGRA) 100 MG tablet Take 100 mg by mouth daily as needed for erectile dysfunction. Not to be taken with nitroglycerin       No current facility-administered medications on file prior to visit.    Allergies: No Known Allergies   Review of Systems:  CONSTITUTIONAL: No fevers, chills, night sweats, or weight loss.   EYES: No visual changes or eye pain ENT: No hearing changes.  No history of nose bleeds.   RESPIRATORY: No cough, wheezing +shortness of breath.   CARDIOVASCULAR: Negative for chest pain, and palpitations.   GI: Negative for abdominal discomfort, blood in stools or black stools.  No recent change in bowel habits.   GU:  No history of incontinence.   MUSCLOSKELETAL: No history of joint pain or swelling.  No myalgias.   SKIN: Negative for lesions, rash, and itching.   HEMATOLOGY/ONCOLOGY: Negative for prolonged bleeding, bruising easily, and swollen nodes ENDOCRINE: Negative for cold or heat intolerance, polydipsia or goiter.   PSYCH:  No depression or anxiety symptoms.   NEURO: As Above.   Vital Signs:  BP 140/72  Pulse 70  Ht 5\' 11"  (1.803 m)  Wt 199 lb 4.8 oz (90.402 kg)  BMI 27.81 kg/m2  Neurological Exam: MENTAL STATUS including orientation to time, place, person, recent and remote memory, attention span and concentration, language, and fund of knowledge is normal. Speech is not dysarthric.   CRANIAL NERVES: Pupils round and reactive to light. Normal conjugate, extra-ocular eye movements in all directions of gaze.  No ptosis. Face is symmetric. Tongue is midline.  Pathological facial reflexes are present (increased jaw jerk, Snout, palmomental reflexes, and Myerson's signs)  MOTOR: Mild atrophy of intrinsic hand muscles bilaterally. Left > right postural hand tremor with low amplitude. Tremor is not present at rest. Coarse tremulous hand writing  Right Upper  Extremity:    Left Upper Extremity:    Deltoid  5/5   Deltoid  5/5   Biceps  5/5   Biceps  5/5   Triceps  5-/5   Triceps  5-/5   Wrist extensors  5/5   Wrist extensors  5/5   Wrist flexors  5/5   Wrist flexors  5/5   Finger extensors  5/5   Finger extensors  5/5   Finger flexors  5/5   Finger flexors  5/5   Dorsal interossei  4/5   Dorsal interossei  4/5   Abductor pollicis  5/5   Abductor pollicis  5/5   Tone (Ashworth scale)  0   Tone (Ashworth scale)  0    Right Lower Extremity:    Left Lower Extremity:    Hip flexors  5/5   Hip flexors  5/5   Hip extensors  5/5   Hip extensors  5/5   Knee flexors  5/5   Knee flexors  5/5   Knee extensors  5/5   Knee extensors  5/5   Dorsiflexors  5/5   Dorsiflexors  5/5   Plantarflexors  5-/5   Plantarflexors  5-/5   Toe extensors  4+/5   Toe extensors  4+/5   Toe flexors  5/5   Toe flexors  5/5   Tone (Ashworth scale)  0   Tone (Ashworth scale)  0    MSRs:  Right Left  brachioradialis  2+   brachioradialis  2+   biceps  2+   biceps  2+   triceps  1+   triceps  1+   patellar  2+   patellar  2+   ankle jerk  0   ankle jerk  0   Hoffman  no   Hoffman  no   plantar response  down   plantar response  down    SENSORY: Vibration reduced to 50% at knees and absent distal to ankles bilaterally, proprioception at great toe is slightly impaired.    COORDINATION/GAIT: Bradykinesia with reduced rate of finger tapping and toe tapping bilaterally (L >R) Able to rise from a chair without using arms. Slightly stooped posture with small steps, gait appears narrow and stable. He is unsteady with heel walking and tandem gait.   Data: MRI brain 10/22/2012:  No acute infarct.  Mild small vessel disease type changes.  Global atrophy without hydrocephalus.  No intracranial mass lesion detected on this unenhanced exam.  Polypoid complex opacification left maxillary sinus. Remodeling of the left alveolar ridge without change. CT imaging of the sinuses can be  obtained for further delineation if clinically desired.   US carotids 10/11/2010: Mild intimal thickening bilaterally without significant plaque formation or stenosis.  Component     Latest Ref Rng 03/06/2013  Ceruloplasmin     20 - 60 mg/dL 32  Copper     70 - 784 mcg/dL 696  Methylmalonic Acid, Quant     <0.40 umol/L 0.23  Hemoglobin A1C     4.6 - 6.5 % 5.5  TSH     0.35 - 5.50 uIU/mL 2.00  Vitamin B-12     211 - 911 pg/mL 415   SPEP with IFE: Monoclonal IgG kappa protein is present Urine IFE:  No monoclonal free light chains (Bence Jones Protein) are detected.Urine IFE shows polyclonal increase in free Kappa and/or free Lambda light chains  Tilt tablet 05/06/2013:  Given history though have suggested that he consider orthostatic maneuvers and or compression hose(waist high-20-30 mm) to see if empiric therapy assoc with improvement     IMPRESSION: 1.  Idiopathic peripheral neuropathy with dysautonomia (orthostatic intolerance, early satiety, dry mouth)  - Clinically stable  - neuropathy labs have been unrevealing, except that he has MGUS (monoclonal IgG kappa gammopathy)  - he has tried a number of medications in the past, which have not helped  - will offer a trial of alpha lipoic acid 600mg  daily 2.   Lightheadedness/dizziness  - Extensive evaluation in the past has been negative, but I suspect it is due to lack of prioprioceptive input from dysautonomia  - Previously tried vestibular therapy without significant benefit 3.  Benign essential tremors  - Per patient, has been tried on a number of medications without any change in symptoms 4.  Chronic dyspnea secondary to R phrenic nerve injury   PLAN/RECOMMENDATIONS:  1.  Start alpha-lipoic acid 600mg  2.  Recommended knee high stocking to see if there is any improvement in lightheadedness, but  patient deferred 3.  He is going to [facility] for surgical opinion for his right hemidiaphragm, he had a number of questions which I tried  to answer to the best of my ability, but deferred more specific information to the pulmonology and CTS  4. Return to clinic in 66-month  The duration of this appointment visit was 35 minutes of face-to-face time with the patient.  Greater than 50% of this time was spent in counseling, explanation of diagnosis, planning of further management, and coordination of care.   Thank you for allowing me to participate in patient's care.  If I can answer any additional questions, I would be pleased to do so.    Sincerely,    Aalayah Riles K. Allena Katz, DO

## 2013-05-23 NOTE — Telephone Encounter (Signed)
[id number].[place], CMA  

## 2013-05-29 NOTE — Telephone Encounter (Signed)
I spoke with the pt spouse and advised to have pt call us back. She states they are leaving today at 11am so they will call when they call back. [name], CMA

## 2013-06-10 ENCOUNTER — Other Ambulatory Visit: Payer: Self-pay | Admitting: Physician Assistant

## 2013-06-14 NOTE — Telephone Encounter (Signed)
[id number].[place], CMA  

## 2013-06-17 NOTE — Telephone Encounter (Signed)
I spoke with the pt and he states he has stayed in touch with [place] and they have been in touch with his insurance company and they are waiting on a response from the [employer], which they have not received at this time. Pt states that he is to the point that he is about to just give up on the whole process. He will let us know.  [place], CMA

## 2013-08-02 ENCOUNTER — Telehealth: Payer: Self-pay

## 2013-08-02 NOTE — Telephone Encounter (Signed)
Nurse from [facility] to check up on this request. Please advise.

## 2013-08-02 NOTE — Telephone Encounter (Signed)
Pt has an appt on Monday 08/05/13 @ 11:30 am.

## 2013-08-02 NOTE — Telephone Encounter (Signed)
1. No - to request for x-ray 2. OV Monday, March 2nd

## 2013-08-02 NOTE — Telephone Encounter (Signed)
Phone call from Lewis, nurse at [facility] [phone]. She states patient reports right shoulder pain with limited range of motion. He's been taking Advil x 2 days and this is not helping. Requesting an xray order be faxed to [phone].

## 2013-08-05 ENCOUNTER — Encounter: Payer: Self-pay | Admitting: Internal Medicine

## 2013-08-05 ENCOUNTER — Ambulatory Visit (INDEPENDENT_AMBULATORY_CARE_PROVIDER_SITE_OTHER): Payer: Medicare Other | Admitting: Internal Medicine

## 2013-08-05 VITALS — BP 122/90 | HR 79 | Wt 196.8 lb

## 2013-08-05 DIAGNOSIS — M67919 Unspecified disorder of synovium and tendon, unspecified shoulder: Secondary | ICD-10-CM

## 2013-08-05 DIAGNOSIS — M25519 Pain in unspecified shoulder: Secondary | ICD-10-CM

## 2013-08-05 DIAGNOSIS — M719 Bursopathy, unspecified: Secondary | ICD-10-CM

## 2013-08-05 DIAGNOSIS — M7551 Bursitis of right shoulder: Secondary | ICD-10-CM

## 2013-08-05 MED ORDER — METHYLPREDNISOLONE ACETATE 40 MG/ML IJ SUSP
40.0000 mg | Freq: Once | INTRAMUSCULAR | Status: AC
  Administered 2013-08-05: 40 mg via INTRA_ARTICULAR

## 2013-08-05 NOTE — Patient Instructions (Signed)
Good to see you.  If you have progressive problems with  Positional vertigo there is treatment with Florinef.  Shoulder pain is most likely a Bursitis - a short term problem that the cortisone injection should help. Please do not over use your shoulder, i.e. Golf or swimming, for at least a week or so. You should start with simple exercise - arm circles and other range of motion issues.   Bursitis Bursitis is a swelling and soreness (inflammation) of a fluid-filled sac (bursa) that overlies and protects a joint. It can be caused by injury, overuse of the joint, arthritis or infection. The joints most likely to be affected are the elbows, shoulders, hips and knees. HOME CARE INSTRUCTIONS   Apply ice to the affected area for 15-20 minutes each hour while awake for 2 days. Put the ice in a plastic bag and place a towel between the bag of ice and your skin.  Rest the injured joint as much as possible, but continue to put the joint through a full range of motion, 4 times per day. (The shoulder joint especially becomes rapidly "frozen" if not used.) When the pain lessens, begin normal slow movements and usual activities.  Only take over-the-counter or prescription medicines for pain, discomfort or fever as directed by your caregiver.  Your caregiver may recommend draining the bursa and injecting medicine into the bursa. This may help the healing process.  Follow all instructions for follow-up with your caregiver. This includes any orthopedic referrals, physical therapy and rehabilitation. Any delay in obtaining necessary care could result in a delay or failure of the bursitis to heal and chronic pain. SEEK IMMEDIATE MEDICAL CARE IF:   Your pain increases even during treatment.  You develop an oral temperature above 102 F (38.9 C) and have heat and inflammation over the involved bursa. MAKE SURE YOU:   Understand these instructions.  Will watch your condition.  Will get help right away if  you are not doing well or get worse. Document Released: 05/20/2000 Document Revised: 08/15/2011 Document Reviewed: 04/24/2009 [facility] Patient Information 2014 [place].

## 2013-08-05 NOTE — Progress Notes (Signed)
Pre visit review using our clinic review tool, if applicable. No additional management support is needed unless otherwise documented below in the visit note. 

## 2013-08-06 NOTE — Progress Notes (Signed)
Subjective:    Patient ID: Adrian Juba., male    DOB: 23-Oct-1927, 80 y.o.   MRN: [id number]  HPI Adrian West presents for evaluation and treatment of acute right shoulder pain that began 4 days ago. He does not recall a precipitating event. He report pain with abduction at the extreme of ROM. He is able to manage his ADLs. He denies any paresthesia right UE or Hand, focal right UE weakness, denies any click or lock.  He is still awaiting finalization of appointment at the [facility] in [place] with Dr. Irish Elders for evaluation and treatment of paralyzed right hemi-diaphgram and chronic SOB. He does have [employer]-Medicare prior authorizations in hand.  Until this injury he was swimming, working with Physiological scientist and playing tennis. He enjoys [facility]. His most recent hobby is taking up creative writing.   PMH, FamHx and SocHx reviewed for any changes and relevance. Current Outpatient Prescriptions on File Prior to Visit  Medication Sig Dispense Refill  . aspirin EC 81 MG tablet Take 81 mg by mouth daily.      Marland Kitchen atorvastatin (LIPITOR) 10 MG tablet Take 10 mg by mouth daily.      . clopidogrel (PLAVIX) 75 MG tablet TAKE ONE TABLET BY MOUTH ONCE DAILY  30 tablet  5  . levothyroxine (SYNTHROID, LEVOTHROID) 125 MCG tablet Take 125 mcg by mouth daily before breakfast.      . metoprolol succinate (TOPROL-XL) 25 MG 24 hr tablet Take 25 mg by mouth daily.      . Multiple Vitamin (MULTIVITAMIN WITH MINERALS) TABS tablet Take 1 tablet by mouth daily.      . naproxen sodium (ANAPROX) 220 MG tablet Take 220 mg by mouth daily as needed (pain).      . NON FORMULARY Oxygen 2 LPM QHS      . sildenafil (VIAGRA) 100 MG tablet Take 100 mg by mouth daily as needed for erectile dysfunction. Not to be taken with nitroglycerin       No current facility-administered medications on file prior to visit.      Review of Systems System review is negative for any constitutional, cardiac,  pulmonary, GI or neuro symptoms or complaints other than as described in the HPI.     Objective:   Physical Exam Filed Vitals:   08/05/13 1145  BP: 122/90  Pulse: 68   Gen'l- Elderly man with audible shortness of breath with mild increased WOB HEENT_ C&S clear Cor 2+ radial pulse Pulm - O2 sat is normal. MSK - normal passive ROM right shoulder, no click or lock, pain with abduction, pain with movement against resistance when arm is at 180 degrees, tender to palpation over the lateral joint space.  Procedure Joint/bursal injection  Indication - localized pain right shoulder - presumed Bursitis Consent - informed verbal consent from patient after explanation of risks of bleeding and infection Prep - injection site identified, prepped with betadine followed by alcohol. Med -  40 Mg depomedrol with 0.5 Cc 2% xylocain Injection - bursa/joint space entered easily. Injected without difficulty. Patient tolerated this well. Post-procedure - patient with rapid reduction in discomfort. Bandaid applied. Routine precautions provided including instruction to return for fever, drainage or increased pain        Assessment & Plan:  Bursitis Right shoulder - no identified precipitating even.  Plan Cautioned to not over use shoulder after steroid injection and to do ROM exercise.  Call for unrelieved discomfort - may need sports medicine  referral.

## 2013-08-20 ENCOUNTER — Encounter: Payer: Self-pay | Admitting: Neurology

## 2013-08-20 ENCOUNTER — Ambulatory Visit (INDEPENDENT_AMBULATORY_CARE_PROVIDER_SITE_OTHER): Payer: Medicare Other | Admitting: Neurology

## 2013-08-20 VITALS — BP 110/80 | HR 73 | Wt 195.0 lb

## 2013-08-20 DIAGNOSIS — R42 Dizziness and giddiness: Secondary | ICD-10-CM

## 2013-08-20 DIAGNOSIS — G609 Hereditary and idiopathic neuropathy, unspecified: Secondary | ICD-10-CM

## 2013-08-20 DIAGNOSIS — G629 Polyneuropathy, unspecified: Secondary | ICD-10-CM

## 2013-08-20 NOTE — Progress Notes (Signed)
[facility]  Follow-up Visit   Date: 08/20/2013    Adrian West Jr. MRN: [id number] DOB: 05-07-1928   Interim History: Adrian Lamer Fudala Brooke Bonito. is a 80 y.o. 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  The patient was accompanied to the clinic by self.  He was last seen in the office on 05/21/2013.  History of present illness: He was seeing Dr. Erling West for 30+ years for peripheral neuropathy, left hand tremor, and vertigo. He was last seen in March 2014 at which time he was told that there was no additional therapies that could be offered. Adrian West has a 10-year history of vertigo, described as lightheadedness and intermittent. It is triggered by changes in position, such standing up or leaning over. Symptoms last about 2-minutes. It occurs about 6-10 times a day, depending on activity level. He has never fallen, but does hold on to objects. Rolling in bed or abrupt changes in head position does not worse these symptoms.   Additionally, he reports "blacking out" over the past 2-3 years. There is no associated loss of consciousness with these spells, but he feels tired and weak. Spells generally last about an hour and there is no prodromal symptoms. In June, he passed out and again did not lose consciousness but continued to feel weak. He was admitted and symptoms were attributed to dehydration.   Of note, he has had an episode of slurred speech in May 2012 which lasted several hours. This occurred in the setting of missing 2 days of his aspirin. He had a stroke workup which was negative and symptoms were attributed to possible TIA. MRI of the brain 10/20/2010 showed mild periventricular  subcortical chronic small vessel disease. Vessel imaging was normal. He was briefly on Plavix but developed a rash so was maintained on aspirin.   Regarding his shortness of breath, his workup has included EMG (5 06-2010), CPK, VGCC, anti-Hu, anti-Ri, and acetylcholine receptor antibodies which is within normal. Etiology: phrenic nerve injury.  Follow-up 05/21/2013:  Clinically unchanged. Evaluated by Dr. Beryle Beams whose work-up for consistent with MGUS and Dr. Caryl Comes performed tilt tablet which did not show evidence of othostasis.  He recommended using waist high stockings, but did not try this.  He is planning on having a consultation at [facility] in [place], [place] by Dr. Ellis Parents to see if he is a candidate for surgical intervention of his right phrenic injury.   - Follow-up 08/20/2013:  He reports no worsening of tremors and lightheadedness.  His wife bought vertigo natural oils that he places behind his ear, which he reports to have helped during his balance exercises.  There has been no interval falls and he has no new neurological complaints.  He is scheduled to go to [facility] on May 2nd and has many questions regarding his upcoming visit.   Medications:  Current Outpatient Prescriptions on File Prior to Visit  Medication Sig Dispense Refill  . aspirin EC 81 MG tablet Take 81 mg by mouth daily.      Marland Kitchen atorvastatin (LIPITOR) 10 MG tablet Take 10 mg by mouth daily.      . clopidogrel (PLAVIX) 75 MG tablet TAKE ONE TABLET BY MOUTH ONCE DAILY  30 tablet  5  .  levothyroxine (SYNTHROID, LEVOTHROID) 125 MCG tablet Take 125 mcg by mouth daily before breakfast.      . metoprolol succinate (TOPROL-XL) 25 MG 24 hr tablet Take 25 mg by mouth daily.      . Multiple Vitamin (MULTIVITAMIN WITH MINERALS) TABS tablet Take 1 tablet by mouth daily.      . naproxen sodium (ANAPROX) 220 MG tablet Take 220 mg by mouth daily as needed (pain).      . NON FORMULARY Oxygen 2 LPM QHS        . sildenafil (VIAGRA) 100 MG tablet Take 100 mg by mouth daily as needed for erectile dysfunction. Not to be taken with nitroglycerin       No current facility-administered medications on file prior to visit.    Allergies: No Known Allergies   Review of Systems:  CONSTITUTIONAL: No fevers, chills, night sweats, or weight loss.   EYES: No visual changes or eye pain ENT: No hearing changes.  No history of nose bleeds.   RESPIRATORY: No cough, wheezing +shortness of breath.   CARDIOVASCULAR: Negative for chest pain, and palpitations.   GI: Negative for abdominal discomfort, blood in stools or black stools.  No recent change in bowel habits.   GU:  No history of incontinence.   MUSCLOSKELETAL: No history of joint pain or swelling.  No myalgias.   SKIN: Negative for lesions, rash, and itching.   HEMATOLOGY/ONCOLOGY: Negative for prolonged bleeding, bruising easily, and swollen nodes ENDOCRINE: Negative for cold or heat intolerance, polydipsia or goiter.   PSYCH:  No depression or anxiety symptoms.   NEURO: As Above.   Vital Signs:  BP 110/80  Pulse 73  Wt 195 lb (88.451 kg)  SpO2 93%  Neurological Exam: MENTAL STATUS including orientation to time, place, person, recent and remote memory, attention span and concentration, language, and fund of knowledge is normal. Speech is not dysarthric, but his sentences are short due to dyspnea.  CRANIAL NERVES: Pupils round and reactive to light. Normal conjugate, extra-ocular eye movements in all directions of gaze.  No ptosis. Face is symmetric. Tongue is midline.  Pathological facial reflexes are present (increased jaw jerk, Snout, palmomental reflexes, and Myerson's signs)  MOTOR: Mild atrophy of intrinsic hand muscles bilaterally. Left > right postural hand tremor with low amplitude. Tremor is not present at rest. Coarse tremulous hand writing  Right Upper Extremity:    Left Upper Extremity:    Deltoid  5/5   Deltoid  5/5   Biceps  5/5    Biceps  5/5   Triceps  5-/5   Triceps  5-/5   Wrist extensors  5/5   Wrist extensors  5/5   Wrist flexors  5/5   Wrist flexors  5/5   Finger extensors  5/5   Finger extensors  5/5   Finger flexors  5/5   Finger flexors  5/5   Dorsal interossei  4/5   Dorsal interossei  4/5   Abductor pollicis  5/5   Abductor pollicis  5/5   Tone (Ashworth scale)  0   Tone (Ashworth scale)  0    Right Lower Extremity:    Left Lower Extremity:    Hip flexors  5/5   Hip flexors  5/5   Hip extensors  5/5   Hip extensors  5/5   Knee flexors  5/5   Knee flexors  5/5   Knee extensors  5/5   Knee extensors  5/5   Dorsiflexors  5/5  Dorsiflexors  5/5   Plantarflexors  5-/5   Plantarflexors  5-/5   Toe extensors  4+/5   Toe extensors  4+/5   Toe flexors  5/5   Toe flexors  5/5   Tone (Ashworth scale)  0   Tone (Ashworth scale)  0    MSRs:  Right Left  brachioradialis  2+   brachioradialis  2+   biceps  2+   biceps  2+   triceps  1+   triceps  1+   patellar  2+   patellar  2+   ankle jerk  0   ankle jerk  0   Hoffman  no   Hoffman  no   plantar response  down   plantar response  down    SENSORY: Vibration reduced to 50% at knees and absent distal to ankles bilaterally, proprioception at great toe is slightly impaired.    COORDINATION/GAIT: Bradykinesia with reduced rate of finger tapping and toe tapping bilaterally (L >R) . Slightly stooped posture with small steps, gait appears narrow and stable.    Data: MRI brain 10/22/2012:  No acute infarct. Mild small vessel disease type changes. Global atrophy without hydrocephalus. No intracranial mass lesion detected on this unenhanced exam. Polypoid complex opacification left maxillary sinus. Remodeling of the left alveolar ridge without change. CT imaging of the sinuses can be obtained for further delineation if clinically desired.   US carotids 10/11/2010: Mild intimal thickening bilaterally without significant plaque formation or stenosis.  Labs 03/06/2013:   ceruloplasmin 32, copper 108, MMA 0.23, HbA1c 5.5, TSH 2.00, B12 415 SPEP with IFE: Monoclonal IgG kappa protein is present Urine IFE:  No monoclonal free light chains (Bence Jones Protein) are detected.Urine IFE shows polyclonal increase in free Kappa and/or free Lambda light chains  Tilt tablet 05/06/2013:  Non-diagnostic test.  Given history though have suggested that he consider orthostatic maneuvers and or compression hose(waist high-20-30 mm) to see if empiric therapy assoc with improvement    IMPRESSION/PLAN: 1.  Idiopathic peripheral neuropathy with dysautonomia   Clinically stable  Neuropathy labs have been unrevealing, except that he has MGUS (monoclonal IgG kappa gammopathy)  He has tried a number of medications in the past, none of which have helped. I explained that given his symptoms are predominately large fiber sensory loss, medications are not helpful.  Should he develop positive symptoms such as paresthesia/pain, neuralgesic medications can be offered. 2.   Lightheadedness/dizziness  Extensive evaluation in the past has been negative, but I suspect it is due to lack of prioprioceptive input from dysautonomia  Recommended knee high stockings, but patient deferred  Continue balance exercises  Fall precautions discussed and literature provided 3.  Benign essential tremors  Per patient, has been tried on a number of medications without any change in symptoms  He is able to function without severe limitation, so will hold off on repeat trial of medication 4.  Chronic dyspnea secondary to R phrenic nerve injury  He is going to [facility] for surgical opinion of his right hemidiaphragm and he had a number of questions which I tried to answer to the best of my ability, but deferred more specific information to pulmonology.  I encouraged him to go to medical records to obtain previous testing that has been requested prior to his upcoming appointment 5.  Return to  clinic in 67-month    The duration of this appointment visit was 35 minutes of face-to-face time with the patient.  Greater than  50% of this time was spent in counseling, explanation of diagnosis, planning of further management, and coordination of care.   Thank you for allowing me to participate in patient's care.  If I can answer any additional questions, I would be pleased to do so.    Sincerely,    Donika K. Posey Pronto, DO

## 2013-08-20 NOTE — Patient Instructions (Signed)
Continue balance exercises are you are Encouraged to go to medical records to request records to take to [facility] I will see you back in 3-months  [facility]  Preventing Falls in the [place] are common, often dreaded events in the lives of older people. Aside from the obvious injuries and even death that may result, falls can cause wide-ranging consequences including loss of independence, mental decline, decreased activity, and mobility. Younger people are also at risk of falling, especially those with chronic illnesses and fatigue.  Ways to reduce the risk for falling:  * Examine diet and medications. Warm foods and alcohol dilate blood vessels, which can lead to dizziness when standing. Sleep aids, antidepressants, and pain medications can also increase the likelihood of a fall.  * Get a vison exam. Poor vision, cataracts, and glaucoma increase the chances of falling.  * Check foot gear. Shoes should fit snugly and have a sturdy, nonskid sole and broad, low heel.  * Participate in a physician-approved exercise program to build and maintain muscle strength and improve balance and coordination.  * Increase vitamin D intake. Vitamin D improves muscle strength and increases the amount of calcium the body is able to absorb and deposit in bones.  How to prevent falls from common hazards:  * Floors - Remove all loose wires, cords, and throw rugs. Minimize clutter. Make sure rugs are anchored and smooth. Keep furniture in its usual place.  * Chairs - Use chairs with straight backs, armrests, and firm seats. Add firm cushions to existing pieces to add height.  * Bathroom - Install grab bars and non-skid tape in the tub or shower. Use a bathtub transfer bench or a shower chair with a back support. Use an elevated toilet seat and/or safety rails to assist standing from a low surface. Do not use towel racks or bathroom tissue holders to help you stand.  * Lighting - Make sure  halls, stairways, and entrances are well-lit. Install a night light in your bathroom or hallway. Make sure there is a light switch at the top and bottom of the staircase. Turn lights on if you get up in the middle of the night. Make sure lamps or light switches are within reach of the bed if you have to get up during the night.  * Kitchen - Install non-skid rubber mats near the sink and stove. Clean spills immediately. Store frequently used utensils, pots, and pans between waist and eye level. This helps prevent reaching and bending. Sit when getting things out of the lower cupboards.  * Living room / [place] furniture with wide spaces in between, giving enough room to move around. Establish a route through the living room that gives you something to hold onto as you walk.  * Stairs - Make sure treads, rails, and rugs are secure. Install a rail on both sides of the stairs. If stairs are a threat, it might be helpful to arrange most of your activities on the lower level to reduce the number of times you must climb the stairs.  * Entrances and doorways - Install metal handles on the walls adjacent to the doorknobs of all doors to make it more secure as you travel through the doorway.  Tips for maintaining balance:  * Keep at least one hand free at all times Try using a backpack or fanny pack to hold things rather than carrying them in your hands. Never carry objects in both hands when  walking as this interferes with keeping your balance.  * Attempt to swing both arms from front to back while walking. This might require a conscious effort if Parkinson's disease has diminished your movement. It will, however, help you to maintain balance and posture, and reduce fatigue.  * Consciously lift your feet off the ground when walking. Shuffling and dragging of the feet is a common culprit in losing your balance.  * When trying to navigate turns, use a "U" technique of facing forward and making a wide turn,  rather than pivoting sharply.  * Try to stand with your feet shoulder-length apart. When your feet are close together for any length of time, you increase your risk of losing your balance and falling.  * Do one thing at a time. Do not try to walk and accomplish another task, such as reading or looking around. The decrease in your automatic reflexes complicates motor function, so the less distraction, the better.  * Do not wear rubber or gripping soled shoes, they might "catch" on the floor and cause tripping.  * Move slowly when changing positions. Use deliberate, concentrated movements and, if needed, use a grab bar or walking aid. Count fifteen (15) seconds after standing to begin walking.  * If balance is a continuous problem, you might want to consider a walking aid such as a cane, walking stick, or walker. Once you have mastered walking with help, you may be ready to try it again on your own.  This information is provided by [facility] and is not intended to replace the medical advice of your physician or other health care providers. Please consult your physician or other health care providers for advice regarding your specific medical condition.

## 2013-08-22 ENCOUNTER — Other Ambulatory Visit (HOSPITAL_COMMUNITY): Payer: Self-pay | Admitting: Interventional Radiology

## 2013-08-22 DIAGNOSIS — N2889 Other specified disorders of kidney and ureter: Secondary | ICD-10-CM

## 2013-08-27 DIAGNOSIS — Z0289 Encounter for other administrative examinations: Secondary | ICD-10-CM

## 2013-09-02 ENCOUNTER — Telehealth: Payer: Self-pay | Admitting: Internal Medicine

## 2013-09-02 NOTE — Telephone Encounter (Signed)
Please let Adrian West know that I received his letter dated 08/21/13 and I noted his [facility] appt may 2015. Does he need [place] to send records to t hem?. His assigned patient ambassador there is [place]  Thanks  Dr. Brand Males, M.D., [facility]C.P Pulmonary and Critical Care Medicine Staff Physician [facility] Pager: [phone], If no answer or between  15:00h - 7:00h: call [phone]  09/02/2013 4:07 AM

## 2013-09-05 NOTE — Telephone Encounter (Signed)
[id number] with spouse to have pt call back.Rowley Bing, CMA

## 2013-09-17 NOTE — Telephone Encounter (Signed)
LMTCBx2 with pt spouse.

## 2013-09-17 NOTE — Telephone Encounter (Signed)
Pt returned call & states it is ok to leave a detailed message on his voice mail if he doesn't answer.  Adrian West

## 2013-09-19 NOTE — Telephone Encounter (Signed)
lmtcb x1 

## 2013-09-20 ENCOUNTER — Ambulatory Visit (INDEPENDENT_AMBULATORY_CARE_PROVIDER_SITE_OTHER): Payer: Medicare Other | Admitting: Internal Medicine

## 2013-09-20 ENCOUNTER — Encounter: Payer: Self-pay | Admitting: Internal Medicine

## 2013-09-20 VITALS — BP 114/84 | HR 70 | Ht 71.0 in | Wt 195.0 lb

## 2013-09-20 DIAGNOSIS — R0989 Other specified symptoms and signs involving the circulatory and respiratory systems: Principal | ICD-10-CM

## 2013-09-20 DIAGNOSIS — R0609 Other forms of dyspnea: Secondary | ICD-10-CM

## 2013-09-20 NOTE — Progress Notes (Signed)
Subjective:    Patient ID: Adrian West., male    DOB: [birth year], 80 y.o.   MRN: [id number]  HPI   Dyspnea  - class 3-4 following Mitral valve surgery June 2010 and resultant right diaphragm paralysis. No improvement with rehab.  2. Restricted PFTs out of pproportion to above. Seen by [name] at [place] - suspects associated loss of height and ? etoh neuropathy (drinks 1-3 glasses wine nightly) playing a role.  Did not recommend surgical plication - was evaluated by CVTS at [place] 02/11/2013 Dyspnea followup  -I have not seen him in 11 months. Since then dyspnea persists unchanged. In the last 30 days he has tried nocturnal oxygen presumably on an empiric basis without any overnight desaturation test. However, this is not helping dyspnea. He is even tried one week of morphine oral and this is not helping dyspnea either. Associated fatigue present. Dyspnea is impacting quality of life. His wife is urging him to seek a third opinion possibly at [facility]. He is not so sure about it. Also at last visit I recommended incentive spirometry and weight loss but he has not done either of those and he forgot about it  - Of note, 2 days ago went to the emergency department with left shoulder pain and was diagnosed to have left glenohumeral arthritis. I do notice that no cardiac enzyme markers were done   OV 05/13/2013  - STill no change in dyspnea. Now in touch with [facility] aftter much delay (he did not answer their phone). HE tells mehis pcp Dr Linda Hedges spoke directly to Dr Ellis Parents at [facility] and has been told that diaphragmatic plication could help him. THis call is on chart 04/26/13/ He has tons of repeat questions on the surgery and outcomes and complication. I had to remind him for each question that is best ansered at [facility]. [place] dyspnea unchanged in 4 years. Acupuncture is not helping. Seen neuro PATEL, DONIKA, MD - some additional tests ordered for ? MGUS but results  unknown to me. I have encouraged him  OV 09/20/2013  Chief Complaint  Patient presents with  . Follow-up    Breathing is unchanged. Has upcoming appointment with a Pulmonologist in [place].    He has upcoming appointment first week of May with Dr. Jeannine Kitten at [facility] in [place]. His dyspnea is unchanged. He does want to go over his trip and what to expect at [place] in [place]. No new issues. Review of Systems  Constitutional: Negative for fever and unexpected weight change.  HENT: Negative for congestion, dental problem, ear pain, nosebleeds, postnasal drip, rhinorrhea, sinus pressure, sneezing, sore throat and trouble swallowing.   Eyes: Negative for redness and itching.  Respiratory: Positive for shortness of breath. Negative for cough, chest tightness and wheezing.   Cardiovascular: Negative for palpitations and leg swelling.  Gastrointestinal: Negative for nausea and vomiting.  Genitourinary: Negative for dysuria.  Musculoskeletal: Negative for joint swelling.  Skin: Negative for rash.  Neurological: Negative for headaches.  Hematological: Does not bruise/bleed easily.  Psychiatric/Behavioral: Negative for dysphoric mood. The patient is not nervous/anxious.        Objective:   Physical Exam  Filed Vitals:   09/20/13 1451 09/20/13 1452  BP:  114/84  Pulse:  70  Height: 5\' 11"  (1.803 m)   Weight: 195 lb (88.451 kg)   SpO2:  92%     Brief exam - alert and oriented x 3  huffing  and puffing as always  - decreased BS on right       Assessment & Plan:

## 2013-09-20 NOTE — Patient Instructions (Addendum)
Best wishes at [facility] Ensure you get good rest because your days will be busy with plenty of appointments Dr Olson can always email me at [email or web address] (she has my email)  REturn in 1 month 

## 2013-09-22 NOTE — Assessment & Plan Note (Signed)
Best wishes at [facility] you get good rest because your days will be busy with plenty of appointments Dr Jeannine Kitten can always email me at [email or web address] (she has my email)  REturn in 1 month

## 2013-09-24 NOTE — Telephone Encounter (Signed)
Pt aware and will let us know if anything further is needed. [place] Bing, CMA

## 2013-10-07 LAB — PULMONARY FUNCTION TEST

## 2013-10-07 LAB — PROTIME-INR: INR: 0.8 — AB (ref 0.9–1.1)

## 2013-10-09 LAB — PULMONARY FUNCTION TEST

## 2013-10-17 ENCOUNTER — Telehealth: Payer: Self-pay | Admitting: Internal Medicine

## 2013-10-17 ENCOUNTER — Ambulatory Visit: Payer: Medicare Other | Admitting: Internal Medicine

## 2013-10-17 DIAGNOSIS — R4689 Other symptoms and signs involving appearance and behavior: Secondary | ICD-10-CM | POA: Insufficient documentation

## 2013-10-17 DIAGNOSIS — Z0289 Encounter for other administrative examinations: Secondary | ICD-10-CM

## 2013-10-17 NOTE — Telephone Encounter (Signed)
atc x1, no vm set up.  Will forward to MR as an FYI.

## 2013-10-18 ENCOUNTER — Encounter: Payer: Self-pay | Admitting: Internal Medicine

## 2013-10-18 ENCOUNTER — Telehealth: Payer: Self-pay | Admitting: Internal Medicine

## 2013-10-18 ENCOUNTER — Ambulatory Visit (INDEPENDENT_AMBULATORY_CARE_PROVIDER_SITE_OTHER): Payer: Medicare Other | Admitting: Internal Medicine

## 2013-10-18 VITALS — BP 120/74 | HR 83 | Temp 97.4°F | Resp 15 | Wt 190.2 lb

## 2013-10-18 DIAGNOSIS — J8410 Pulmonary fibrosis, unspecified: Secondary | ICD-10-CM

## 2013-10-18 DIAGNOSIS — Y95 Nosocomial condition: Secondary | ICD-10-CM

## 2013-10-18 DIAGNOSIS — J189 Pneumonia, unspecified organism: Secondary | ICD-10-CM

## 2013-10-18 DIAGNOSIS — J849 Interstitial pulmonary disease, unspecified: Secondary | ICD-10-CM

## 2013-10-18 DIAGNOSIS — R0989 Other specified symptoms and signs involving the circulatory and respiratory systems: Principal | ICD-10-CM

## 2013-10-18 DIAGNOSIS — R0609 Other forms of dyspnea: Secondary | ICD-10-CM

## 2013-10-18 NOTE — Telephone Encounter (Signed)
Ok with me 

## 2013-10-18 NOTE — Progress Notes (Signed)
Pre visit review using our clinic review tool, if applicable. No additional management support is needed unless otherwise documented below in the visit note. 

## 2013-10-18 NOTE — Progress Notes (Signed)
   Subjective:    Patient ID: Adrian Juba., male    DOB: 05-Dec-1927, 80 y.o.   MRN: [id number]  HPI  He was evaluated at [facility] in [place]  5/4 - 10/14/13 for his chronic dyspnea  pulmonary course. As of [age]-year-old he developed hacking cough which subsequently became productive of copious yellow green sputum as of 5/5. He was not ill prior to leaving [place] or on the flight out to [place].  The patient information records from [facility] and the x-ray completed 10/15/13 were reviewed this revealed a new focal lung consolidation anterior mid aspect of the left lung. There is no change in interstitial lung disease  Upon returning to [place] he was taken to [facility] @ insistence of his  Family 5/13. He was prescribed Levaquin by the ER MD; he has 3 additional days.  Dr. Jeannine Kitten in [facility] had FAXed Rx  for Augmentin and doxycycline to his local pharmacy. He has not filled those prescriptions as of this appointment (Note : summary of above entered into Problem List)     Review of Systems He specifically denies frontal headache, facial pain, nasal purulence, otic pain, otic discharge.  He is not having fever, chills, sweats.  Purulent sputum persists , ? Change as to volume & color  Apparently there was some question of conjunctivitis ; eye drops Rxed ( bottle not brought in)     Objective:   Physical Exam General appearance:well nourished; no acute distress or increased work of breathing is present.  No  lymphadenopathy about the head, neck, or axilla noted.   Eyes:  He does have erythema of the conjunctivae without purulent exudate No lid edema is present. There is no scleral icterus.  Ears:  External ear exam shows no significant lesions or deformities.  Otoscopic examination reveals clear canals, tympanic membranes are dull but  intact bilaterally without bulging, retraction, inflammation or discharge.  Nose:  External  nasal examination shows no deformity or inflammation. Nasal mucosa are dry without lesions or exudates. No septal dislocation or deviation.No obstruction to airflow.   Oral exam: Dental hygiene is good; lips and gums are healthy appearing.There is no oropharyngeal erythema or exudate noted.  He is very hoarse. His tongue is beefy-red.    Neck:  No deformities, thyromegaly, masses, or tenderness noted.   Supple with full range of motion without pain.   Heart:  Normal rate and regular rhythm. S1 and S2 normal without gallop,  click, rub or other extra sounds. Grade 1 systolic murmur present.     Lungs:He has scattered very low grade inspiratory rales/pops/wheeze anteriorly. .No increased work of breathing.    Extremities:  No cyanosis or clubbing  noted .1+ pitting edema  Pulses are decreased due to the edema   Skin: Warm & dry w/o jaundice or tenting. He has profound kyphoscoliosis of the thoracic spine.  His history is rambling & disjointed. He relates remote history with which I was familiar as I had served as his PCP for years          Assessment & Plan:  #1 possible pneumonia in the anterior mid aspect of the left lung, acquired a hospital setting  #2 interstitial lung disease #3 paralyzed diaphragm  Plan:  complete Levaquin. He should not start the dual therapy until  his status reassessed over 48-72 hours after completing Levaquin.

## 2013-10-18 NOTE — Patient Instructions (Addendum)
   Please complete the entire course of Levaquin. Reassess your status over the next 48-72 hours after finishing the medication. Do not start the Augmentin and doxycycline at this time. Starting these can be reevaluated after you finish the Levaquin  Please sign a release of records for the information from [facility]

## 2013-10-18 NOTE — Telephone Encounter (Signed)
Called and spoke to pt's wife, Adrian West. Informed Adrian West that a message was sent to MR stating Adrian West has an apt on Monday 5/18 and nothing further was needed. Adrian West stated she also needed an apt with MR, pt was last seen 03/2013 by MR and was suppose to f/u with TP 3 weeks after OV. Adrian West stated she was going to see PCP soon and was going to call back for a f/u apt with MR. Nothing further needed at this time.

## 2013-10-18 NOTE — Telephone Encounter (Signed)
Left detail massage to inform pt that Dr. Ronnald Ramp it his PCP now.

## 2013-10-18 NOTE — Telephone Encounter (Signed)
Pt request to transfer from Dr. Linda Hedges to Dr. Ronnald Ramp. Pt stated he spoke with someone and they told him they will set him up Dr. Ronnald Ramp when Dr. Linda Hedges retired. Please advise, his wife is also Dr. Adah Salvage pt.

## 2013-10-19 NOTE — Assessment & Plan Note (Signed)
Complete full course Levaquin & reassess status Do not start dual therapy Rxed by Dr Jeannine Kitten until status reassessed

## 2013-10-21 ENCOUNTER — Ambulatory Visit: Payer: Medicare Other | Admitting: Internal Medicine

## 2013-10-22 ENCOUNTER — Other Ambulatory Visit (INDEPENDENT_AMBULATORY_CARE_PROVIDER_SITE_OTHER): Payer: Medicare Other

## 2013-10-22 ENCOUNTER — Telehealth: Payer: Self-pay | Admitting: Internal Medicine

## 2013-10-22 ENCOUNTER — Encounter: Payer: Self-pay | Admitting: Internal Medicine

## 2013-10-22 ENCOUNTER — Ambulatory Visit (INDEPENDENT_AMBULATORY_CARE_PROVIDER_SITE_OTHER): Payer: Medicare Other | Admitting: Internal Medicine

## 2013-10-22 ENCOUNTER — Ambulatory Visit (INDEPENDENT_AMBULATORY_CARE_PROVIDER_SITE_OTHER)
Admission: RE | Admit: 2013-10-22 | Discharge: 2013-10-22 | Disposition: A | Discharge status: Home / Self Care | Payer: Medicare Other | Source: Ambulatory Visit | Attending: Internal Medicine | Admitting: Internal Medicine

## 2013-10-22 ENCOUNTER — Ambulatory Visit: Payer: Medicare Other | Admitting: Internal Medicine

## 2013-10-22 VITALS — BP 108/62 | HR 78 | Temp 97.6°F | Resp 16 | Wt 187.5 lb

## 2013-10-22 VITALS — BP 112/70 | HR 70 | Ht 69.0 in | Wt 182.4 lb

## 2013-10-22 DIAGNOSIS — D472 Monoclonal gammopathy: Secondary | ICD-10-CM

## 2013-10-22 DIAGNOSIS — J189 Pneumonia, unspecified organism: Secondary | ICD-10-CM

## 2013-10-22 DIAGNOSIS — E785 Hyperlipidemia, unspecified: Secondary | ICD-10-CM

## 2013-10-22 DIAGNOSIS — R05 Cough: Secondary | ICD-10-CM

## 2013-10-22 DIAGNOSIS — I10 Essential (primary) hypertension: Secondary | ICD-10-CM

## 2013-10-22 DIAGNOSIS — E039 Hypothyroidism, unspecified: Secondary | ICD-10-CM

## 2013-10-22 DIAGNOSIS — R918 Other nonspecific abnormal finding of lung field: Secondary | ICD-10-CM

## 2013-10-22 DIAGNOSIS — R0609 Other forms of dyspnea: Secondary | ICD-10-CM

## 2013-10-22 DIAGNOSIS — D72820 Lymphocytosis (symptomatic): Secondary | ICD-10-CM | POA: Insufficient documentation

## 2013-10-22 DIAGNOSIS — R059 Cough, unspecified: Secondary | ICD-10-CM

## 2013-10-22 DIAGNOSIS — R0989 Other specified symptoms and signs involving the circulatory and respiratory systems: Principal | ICD-10-CM

## 2013-10-22 DIAGNOSIS — C9110 Chronic lymphocytic leukemia of B-cell type not having achieved remission: Secondary | ICD-10-CM

## 2013-10-22 DIAGNOSIS — J984 Other disorders of lung: Secondary | ICD-10-CM

## 2013-10-22 LAB — COMPREHENSIVE METABOLIC PANEL
ALK PHOS: 60 U/L (ref 39–117)
ALT: 23 U/L (ref 0–53)
AST: 21 U/L (ref 0–37)
Albumin: 3 g/dL — ABNORMAL LOW (ref 3.5–5.2)
BILIRUBIN TOTAL: 0.5 mg/dL (ref 0.2–1.2)
BUN: 17 mg/dL (ref 6–23)
CO2: 30 mEq/L (ref 19–32)
Calcium: 8.7 mg/dL (ref 8.4–10.5)
Chloride: 99 mEq/L (ref 96–112)
Creatinine, Ser: 1.1 mg/dL (ref 0.4–1.5)
GFR: 65.76 mL/min (ref >60.00)
GLUCOSE: 80 mg/dL (ref 70–99)
Potassium: 4.1 mEq/L (ref 3.5–5.1)
SODIUM: 136 meq/L (ref 135–145)
Total Protein: 5.6 g/dL — ABNORMAL LOW (ref 6.0–8.3)

## 2013-10-22 LAB — LIPID PANEL
Cholesterol: 166 mg/dL (ref 0–200)
HDL: 37.8 mg/dL — AB (ref >39.00)
LDL Cholesterol: 98 mg/dL (ref 0–99)
Total CHOL/HDL Ratio: 4
Triglycerides: 152 mg/dL — ABNORMAL HIGH (ref 0.0–149.0)
VLDL: 30.4 mg/dL (ref 0.0–40.0)

## 2013-10-22 LAB — CBC WITH DIFFERENTIAL/PLATELET
BASOS ABS: 0 10*3/uL (ref 0.0–0.1)
Basophils Relative: 0.3 % (ref 0.0–3.0)
EOS ABS: 0.1 10*3/uL (ref 0.0–0.7)
Eosinophils Relative: 1.1 % (ref 0.0–5.0)
HEMATOCRIT: 50.8 % (ref 39.0–52.0)
Hemoglobin: 17.1 g/dL — ABNORMAL HIGH (ref 13.0–17.0)
Lymphocytes Relative: 9.3 % — ABNORMAL LOW (ref 12.0–46.0)
Lymphs Abs: 1.1 10*3/uL (ref 0.7–4.0)
MCHC: 33.7 g/dL (ref 30.0–36.0)
MCV: 94.2 fl (ref 78.0–100.0)
Monocytes Absolute: 0.5 10*3/uL (ref 0.1–1.0)
Monocytes Relative: 3.9 % (ref 3.0–12.0)
Neutro Abs: 10.4 10*3/uL — ABNORMAL HIGH (ref 1.4–7.7)
Neutrophils Relative %: 85.4 % — ABNORMAL HIGH (ref 43.0–77.0)
Platelets: 344 10*3/uL (ref 150.0–400.0)
RBC: 5.39 Mil/uL (ref 4.22–5.81)
RDW: 13.5 % (ref 11.5–15.5)
WBC: 12.2 10*3/uL — AB (ref 4.0–10.5)

## 2013-10-22 LAB — TSH: TSH: 6.28 u[IU]/mL — ABNORMAL HIGH (ref 0.35–4.50)

## 2013-10-22 MED ORDER — LEVOFLOXACIN 750 MG PO TABS: 750.0000 mg | ORAL_TABLET | Freq: Every day | ORAL | Status: DC

## 2013-10-22 MED ORDER — LEVOTHYROXINE SODIUM 150 MCG PO TABS: 150.0000 ug | ORAL_TABLET | Freq: Every day | ORAL | Status: DC

## 2013-10-22 NOTE — Assessment & Plan Note (Signed)
Will cont levaquin for now

## 2013-10-22 NOTE — Telephone Encounter (Signed)
Relevant patient education assigned to patient using Emmi. ° °

## 2013-10-22 NOTE — Assessment & Plan Note (Signed)
I have asked him to have a CXR done to see if the PNA has improved and to see of there is any complication like an effusion

## 2013-10-22 NOTE — Progress Notes (Signed)
Pre visit review using our clinic review tool, if applicable. No additional management support is needed unless otherwise documented below in the visit note. 

## 2013-10-22 NOTE — Assessment & Plan Note (Signed)
I will recheck his TSH level and will adjust his dose if needed

## 2013-10-22 NOTE — Patient Instructions (Signed)
Take levaquin 750mg  once daily for 5 more days  Do not take the amox or doxycycline that Dr Jeannine Kitten gave you Waiting for letter from Dr Jeannine Kitten and then will call you with plan of action that might involve hematology referral for evaluation of possible CLL   Followup  - will call you next few weeks with plan

## 2013-10-22 NOTE — Progress Notes (Signed)
Subjective:    Patient ID: Adrian West., male    DOB: [birth year], 80 y.o.   MRN: [id number]  HPI Comments: He just returned from [place] after a massive work-up, no data from the visit there is available to me today. He tells me that he was treated for PNA out there and that he was diagnosed with CLL.  Cough This is a new problem. The current episode started in the past 7 days. The problem has been gradually improving. The problem occurs every few hours. The cough is productive of purulent sputum. Associated symptoms include shortness of breath. Pertinent negatives include no chest pain, chills, ear congestion, ear pain, fever, headaches, heartburn, hemoptysis, myalgias, nasal congestion, postnasal drip, rash, rhinorrhea, sore throat, sweats, weight loss or wheezing. Nothing aggravates the symptoms. Treatments tried: amoxil, doxy, levaquin. The treatment provided moderate relief. His past medical history is significant for pneumonia. There is no history of asthma, bronchiectasis, bronchitis, COPD, emphysema or environmental allergies.      Review of Systems  Constitutional: Negative.  Negative for fever, chills, weight loss, diaphoresis, appetite change and fatigue.  HENT: Negative.  Negative for ear pain, postnasal drip, rhinorrhea and sore throat.   Eyes: Negative.   Respiratory: Positive for cough and shortness of breath. Negative for apnea, hemoptysis, choking, chest tightness, wheezing and stridor.   Cardiovascular: Negative.  Negative for chest pain, palpitations and leg swelling.  Gastrointestinal: Negative.  Negative for heartburn, nausea, vomiting, abdominal pain, diarrhea, constipation and blood in stool.  Endocrine: Negative.   Genitourinary: Negative.   Musculoskeletal: Negative.  Negative for arthralgias, back pain, myalgias and neck stiffness.  Skin: Negative.  Negative for color change and rash.  Allergic/Immunologic: Negative.  Negative for environmental  allergies.  Neurological: Negative.  Negative for dizziness, tremors, weakness, light-headedness and headaches.  Hematological: Negative.  Negative for adenopathy. Does not bruise/bleed easily.  Psychiatric/Behavioral: Negative.        Objective:   Physical Exam  Vitals reviewed. Constitutional: He is oriented to person, place, and time. He appears well-developed and well-nourished.  Non-toxic appearance. He does not have a sickly appearance. He does not appear ill. No distress.  HENT:  Head: Normocephalic and atraumatic.  Mouth/Throat: Oropharynx is clear and moist. No oropharyngeal exudate.  Eyes: Conjunctivae are normal. Right eye exhibits no discharge. Left eye exhibits no discharge. No scleral icterus.  Neck: Normal range of motion. Neck supple. No JVD present. No tracheal deviation present. No thyromegaly present.  Cardiovascular: Normal rate, regular rhythm, normal heart sounds and intact distal pulses.  Exam reveals no gallop and no friction rub.   No murmur heard. Pulmonary/Chest: Effort normal and breath sounds normal. No stridor. No respiratory distress. He has no wheezes. He has no rales. He exhibits no tenderness.  Abdominal: Soft. Bowel sounds are normal. He exhibits no distension and no mass. There is no tenderness. There is no rebound and no guarding.  Musculoskeletal: Normal range of motion. He exhibits edema (trace edema over both ankles). He exhibits no tenderness.  Lymphadenopathy:    He has no cervical adenopathy.  Neurological: He is oriented to person, place, and time.  Skin: Skin is warm and dry. No rash noted. He is not diaphoretic. No erythema. No pallor.  Psychiatric: He has a normal mood and affect. His behavior is normal. Judgment and thought content normal.     Lab Results  Component Value Date   WBC 5.2 04/17/2013   HGB 17.5* 04/17/2013   HCT 51.8*  04/17/2013   PLT 197 04/17/2013   GLUCOSE 90 10/22/2012   CHOL 210* 09/20/2010   TRIG 89.0 09/20/2010    HDL 49.10 09/20/2010   LDLDIRECT 145.2 09/20/2010   LDLCALC 90 08/31/2006   ALT 19 09/20/2010   AST 20 09/20/2010   NA 138 10/22/2012   K 4.8 10/22/2012   CL 102 10/22/2012   CREATININE 1.0 10/22/2012   BUN 20 10/22/2012   CO2 28 10/22/2012   TSH 2.00 03/06/2013   PSA 0.69 02/14/2008   INR 0.99 08/13/2009   HGBA1C 5.5 03/06/2013       Assessment & Plan:

## 2013-10-22 NOTE — Assessment & Plan Note (Signed)
His BP is well controlled 

## 2013-10-22 NOTE — Telephone Encounter (Signed)
Rec'd from [facility] forward 8 pages to Dr.Jones °

## 2013-10-22 NOTE — Patient Instructions (Signed)

## 2013-10-22 NOTE — Assessment & Plan Note (Signed)
FLP today 

## 2013-10-22 NOTE — Progress Notes (Signed)
Subjective:    Patient ID: Adrian West., male    DOB: 1927-10-18, 80 y.o.   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 No improvement with rehab.  2. Restricted PFTs out of pproportion to above. Seen by [name] at [place] - suspects associated loss of height and ? etoh neuropathy (drinks 1-3 glasses wine nightly) playing a role.  Did not recommend surgical plication - was evaluated by CVTS at [place] 10/22/13  This is followup after visit to [facility] - he was referred there to Dr Ellis Parents for evaluation of surgical plication of paralyzed diaphragm and a means to imprve dyspnea and quality of life. Records not available on this date 10/22/13 but Dr Jeannine Kitten over phone indicated to me that diaphragm function has improved and does not need plication. In additinon, she suspects he has CLL; primary care will be referring him to local oncology. His visit there was complicated by hium falling sick with pneumonia and not being able to complete all workup. Currently he feels down from his illness there and on way back checked into an ER outside [facility] [place]. Started antibiotic Rx of levaquin by ER. Of note, DR Jeannine Kitten had called in Amox/docy for him but he went to ER and started levaquin . However, since then he has collected the script for Amox/doxy   Review of Systems  Constitutional: Negative for fever and unexpected weight change.  HENT: Positive for congestion, rhinorrhea and trouble swallowing. Negative for dental problem, ear pain, nosebleeds, postnasal drip, sinus pressure, sneezing and sore throat.   Eyes: Negative for redness and itching.  Respiratory: Positive for cough and shortness of breath. Negative for chest tightness and wheezing.   Cardiovascular: Negative for palpitations and leg swelling.  Gastrointestinal: Negative for nausea and vomiting.  Genitourinary: Negative for dysuria.    Musculoskeletal: Negative for joint swelling.  Skin: Negative for rash.  Neurological: Negative for headaches.  Hematological: Does not bruise/bleed easily.  Psychiatric/Behavioral: Negative for dysphoric mood. The patient is not nervous/anxious.        Objective:   Physical Exam    vitals reviewed. All below are unchanged Constitutional: looks more fatigued than usual.  HENT:  Head: Normocephalic and atraumatic.  Right Ear: External ear normal.  Left Ear: External ear normal.  Mouth/Throat: Oropharynx is clear and moist. No oropharyngeal exudate.  Eyes: Conjunctivae and EOM are normal. Pupils are equal, round, and reactive to light. Right eye exhibits no discharge. Left eye exhibits no discharge. No scleral icterus.  Neck: Normal range of motion. Neck supple. No JVD present. No tracheal deviation present. No thyromegaly present.  Cardiovascular: Normal rate, regular rhythm and intact distal pulses.  Exam reveals no gallop and no friction rub.   No murmur heard. Pulmonary/Chest: Effort normal. No respiratory distress. He has no wheezes. He has no rales. He exhibits no tenderness.  HE IS PUFFING and APPEARS TO BE MILDLY PANTING EVEN WHEN SITTING AND TALKING TO ME      Diminished air entry rt base  Abdominal: Soft. Bowel sounds are normal. He exhibits no distension and no mass. There is no tenderness. There is no rebound and no guarding.  Musculoskeletal: Normal range of motion. He exhibits no edema and no tenderness.       Walks with hunch  Lymphadenopathy:    He has no cervical adenopathy.  Neurological: He is alert and  oriented to person, place, and time. He has normal reflexes. No cranial nerve deficit. Coordination normal.       Mild LUE tremor  Skin: Skin is warm and dry. No rash noted. He is not diaphoretic. No erythema. No pallor.  Psychiatric: He has a normal mood and affect. His behavior is normal. Judgment and thought content normal.      Assessment & Plan:

## 2013-10-22 NOTE — Assessment & Plan Note (Signed)
I have ordered a CT scan to see if this is cancer, inflammation, or infection

## 2013-10-22 NOTE — Assessment & Plan Note (Signed)
Oncology referral for further evaluation

## 2013-10-24 ENCOUNTER — Other Ambulatory Visit: Payer: Medicare Other

## 2013-10-25 ENCOUNTER — Telehealth: Payer: Self-pay | Admitting: Internal Medicine

## 2013-10-25 NOTE — Telephone Encounter (Signed)
Patient Information:  Caller Name: Adrian West  Phone: [phone]  Patient: Adrian West  Gender: Male  DOB: [birth year]  Age: 80 Years  PCP: [name] (Adults only)  Office Follow Up:  Does the office need to follow up with this patient?: Yes  Instructions For The Office: [place].  OFFICE VS ED.  RN Note:  She states does not want an ambulance . she keeps saying "I want a decision from the doctor"  Wife is requesting assistance due to weakness. Appt scheduled booked. PLEASE CONTACT WIFE.  Symptoms  Reason For Call & Symptoms: Wife Adrian West is calling concerning her husband.  He was hospitalized May13, 2015 with pneumonia.  He was discharged but wife states he is not better . She is concerned he is weak and losing weight.  She wants to discuss Rehab or admission to the hospital. She wants someone to help her.  They  live in [facility] and she had Nurse over this morning.  She told her he needed Rehab.  Currently on antiboitics.  Afebrile.  Declines to eat and drinking Gatoraide. Able to walk to bathroom,  Weakness + .   Wife is very frustated .  Reviewed Health History In EMR: Yes  Reviewed Medications In EMR: Yes  Reviewed Allergies In EMR: Yes  Reviewed Surgeries / Procedures: Yes  Date of Onset of Symptoms: 10/16/2013  Guideline(s) Used:  Weakness (Generalized) and Fatigue  Disposition Per Guideline:   Go to ED Now (or to Office with PCP Approval)  Reason For Disposition Reached:   Drinking very little and dehydration suspected (e.g., no urine > 12 hours, very dry mouth, very lightheaded)  Advice Given:  Call Back If:  Unable to stand or walk  Passes out  Breathing difficulty occurs  You become worse.  RN Overrode Recommendation:  Go To ED  She states does not want an ambulance . she keeps saying "I want a decision from the doctor"  Wife is requesting assistance due to weakness. Rehab or hospital admission.   Appt scheduled booked. PLEASE CONTACT  WIFE.

## 2013-10-30 ENCOUNTER — Telehealth: Payer: Self-pay | Admitting: Internal Medicine

## 2013-10-30 ENCOUNTER — Telehealth: Payer: Self-pay | Admitting: Hematology and Oncology

## 2013-10-30 NOTE — Telephone Encounter (Signed)
LEFT MESSAGE FOR PATIENT TO RETURN CALL TO SCHEDULE APPT.  °

## 2013-10-30 NOTE — Telephone Encounter (Signed)
Spoke with patient regarding order for his CT.   Informed him we are still waiting on the CT to be approved by his insurance company.  He verbalized understanding and thanked for the call.  He also stated he is currently using My Chart but would like to be called as well.   We will schedule him as soon as it is approved.   I asked our [facility]) to follow up with the insurance company so we could get the CT ordered ASAP.

## 2013-10-30 NOTE — Telephone Encounter (Signed)
The xray showed a mass - possibly pneumonia but also may be cancer, CT needs to be done If they are not using mychart then please cancel it as this can be very confusing

## 2013-10-30 NOTE — Telephone Encounter (Signed)
Received call from scheduler stating message was sent that pt daughter very upset over not receiving results (see scheduler comments). After reviewing Epic, results sent via my chart 10/22/13 with additional testing ordered for a CT Scan (system shows CT was cx on 10/24/13). Due to nature of the call, message escalated and routed to upper management.

## 2013-10-30 NOTE — Telephone Encounter (Signed)
pts daughter called.  I advised her she was not a designated party to speak with.  She had general questions about MYCHART and access.  I did explain to her that [facility] is set up independently by the pt.  And that is is an option for all pts to have access to their records.

## 2013-10-30 NOTE — Telephone Encounter (Signed)
Pt's daughter called because she does not know if anyone has called to let them know if he has pneumonia.  She is not on the hippa, but the patient's wife is.  She states he is not getting better.  Please call with the x-ray results.

## 2013-10-31 ENCOUNTER — Telehealth: Payer: Self-pay | Admitting: Internal Medicine

## 2013-10-31 ENCOUNTER — Encounter: Payer: Self-pay | Admitting: Internal Medicine

## 2013-10-31 ENCOUNTER — Other Ambulatory Visit: Payer: Self-pay | Admitting: Internal Medicine

## 2013-10-31 ENCOUNTER — Ambulatory Visit (INDEPENDENT_AMBULATORY_CARE_PROVIDER_SITE_OTHER)
Admission: RE | Admit: 2013-10-31 | Discharge: 2013-10-31 | Disposition: A | Discharge status: Home / Self Care | Payer: Medicare Other | Source: Ambulatory Visit | Attending: Internal Medicine | Admitting: Internal Medicine

## 2013-10-31 DIAGNOSIS — R918 Other nonspecific abnormal finding of lung field: Secondary | ICD-10-CM

## 2013-10-31 DIAGNOSIS — J984 Other disorders of lung: Secondary | ICD-10-CM

## 2013-10-31 MED ORDER — IOHEXOL 300 MG/ML  SOLN
80.0000 mL | Freq: Once | INTRAMUSCULAR | Status: AC | PRN
  Administered 2013-10-31: 80 mL via INTRAVENOUS

## 2013-10-31 NOTE — Telephone Encounter (Signed)
I spoke with the pt and he states he had a CT chest done today, ordered by Dr. Ronnald Ramp, and he mentioned that the pt may need a biopsy. The pt is asking that this be scheduled by tomorrow. I advised the pt that we would not be able to schedule one that fast. I advised that according to Dr. Ronnald Ramp note he was sending a referral to MR to consider a biopsy. I advised the pt that I will send a message to MR to let him know about CT and to get his recommendations. I advised the pt that we will contact him as soon as we can. Per Dr. Ronnald Ramp: Notes Recorded by Janith Lima, MD on 10/31/2013 at 10:05 AM Please tell him that there is a mass in his chest, I have sent a referral to pulm to consider getting a biopsy done   Please advise.Anniston Bing, CMA

## 2013-10-31 NOTE — Telephone Encounter (Signed)
Please let him know I have contacted Dr Ellis Parents at [facility] 11:40 PM on 10/31/2013 and waiting for her reply hopefully 11/01/13. So,  I Can create a plan. Likely will need PET scan first. Please return message to me after talking to him. I will attend back  to it only after 3pm 5//2/15. I am really surprised at this mass dx because Dr Jeannine Kitten did not say anything about a mass. She only told me he got pneumonia while there.   Copy sent to pcp [name], MD for fyi purpose

## 2013-11-01 MED ORDER — LEVOFLOXACIN 500 MG PO TABS
500.0000 mg | ORAL_TABLET | Freq: Every day | ORAL | Status: DC
Start: 2013-11-01 — End: 2013-11-26

## 2013-11-01 NOTE — Telephone Encounter (Signed)
Spoke with patient-he is aware that MR has contacted Dr Jeannine Kitten and is waiting a call back from her. Pt would like MR to contact help this afternoon to let him know if there are any updates or if MR is still waiting to hear back from Dr Jeannine Kitten. Pt does not want to go through the weekend not knowing anything. Thanks.

## 2013-11-01 NOTE — Telephone Encounter (Signed)
Called and spoke with pt per MR recs.  Pt is aware of the following from MR:  MR spoke with Dr. Jeannine Kitten and the LUL mass was not there  Before.  MR is not sure what this is but he does not feel that this is cancer.  MR wants to see pt on 6-2 at 4.  appt has been scheduled and pt is aware of date and time.    Pt and his wife requested that this message be sent back to MR to see if the pt should be on an abx.  Pt finished doxycycline about 3 days ago but they are concerned that the pt may have PNA and they dont want the pt to go through the weekend with out being treated.    Per MR---  If the pt has augmentin or levaquin on hand, he can take the one that he has.  i called and spoke with pt and he stated that he does not have any abx on hand.   So the levaquin 500 mg  #7  1 daily has been sent to the pts pharmacy and pt is aware. Nothing further is needed. Pt will keep the appt on 6-2 with MR

## 2013-11-04 ENCOUNTER — Encounter: Payer: Self-pay | Admitting: Cardiology

## 2013-11-04 ENCOUNTER — Encounter: Payer: Self-pay | Admitting: *Deleted

## 2013-11-04 ENCOUNTER — Ambulatory Visit (INDEPENDENT_AMBULATORY_CARE_PROVIDER_SITE_OTHER): Payer: Medicare Other | Admitting: Cardiology

## 2013-11-04 VITALS — BP 104/75 | HR 63 | Ht 69.0 in | Wt 180.0 lb

## 2013-11-04 DIAGNOSIS — R0989 Other specified symptoms and signs involving the circulatory and respiratory systems: Secondary | ICD-10-CM

## 2013-11-04 DIAGNOSIS — R0609 Other forms of dyspnea: Secondary | ICD-10-CM

## 2013-11-04 DIAGNOSIS — M48061 Spinal stenosis, lumbar region without neurogenic claudication: Secondary | ICD-10-CM

## 2013-11-04 DIAGNOSIS — I080 Rheumatic disorders of both mitral and aortic valves: Secondary | ICD-10-CM

## 2013-11-04 DIAGNOSIS — R0600 Dyspnea, unspecified: Secondary | ICD-10-CM

## 2013-11-04 DIAGNOSIS — I4949 Other premature depolarization: Secondary | ICD-10-CM

## 2013-11-04 DIAGNOSIS — I10 Essential (primary) hypertension: Secondary | ICD-10-CM

## 2013-11-04 DIAGNOSIS — I493 Ventricular premature depolarization: Secondary | ICD-10-CM

## 2013-11-04 DIAGNOSIS — C9110 Chronic lymphocytic leukemia of B-cell type not having achieved remission: Secondary | ICD-10-CM

## 2013-11-05 ENCOUNTER — Encounter: Payer: Self-pay | Admitting: Internal Medicine

## 2013-11-05 ENCOUNTER — Ambulatory Visit (INDEPENDENT_AMBULATORY_CARE_PROVIDER_SITE_OTHER): Payer: Medicare Other | Admitting: Internal Medicine

## 2013-11-05 VITALS — BP 110/58 | HR 61 | Ht 71.0 in | Wt 183.6 lb

## 2013-11-05 DIAGNOSIS — J189 Pneumonia, unspecified organism: Secondary | ICD-10-CM

## 2013-11-05 DIAGNOSIS — J181 Lobar pneumonia, unspecified organism: Principal | ICD-10-CM

## 2013-11-05 NOTE — Patient Instructions (Addendum)
Left upper lobe pneumonia  - odds are that this is new left upper lobe pneumonia developed while in [place], [place] mid-may 2015 (update after review of records suspect developed 10/09/13)  -doubt this is cancer  - please finish levaquin therapy - no role for biopsy right now - meanwhile I will review [facility]  Followup  -r eturn 3 weeks from now with 2 view CXR; see me or my NP Tammy -
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
## 2013-11-05 NOTE — Progress Notes (Signed)
Subjective:    Patient ID: Adrian West, male    DOB: [birth year], 80 y.o.   MRN: [id number]  HPI   Dyspnea  - class 3-4 following Mitral valve surgery June 2010 and resultant right diaphragm paralysis. No improvement with rehab.  2. Restricted PFTs out of pproportion to above. Seen by [name] at [place] - suspects associated loss of height and ? etoh neuropathy (drinks 1-3 glasses wine nightly) playing a role.  Did not recommend surgical plication - was evaluated by CVTS at [place] 11/05/2013   - Acute office visit  Chief Complaint  Patient presents with  . Follow-up    Pt states breathing is unchanged. C/o constant DOE. Denies cough, CP/tightness.    I hjad seen him 10/22/13 as followup from his visit to [place], [place]. 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 Instead she was concerned about possible a) aspiration and b) ILD c) CLL. Precise notes are unavailable at this time as well although as he was exiting the office I noticed he had the GI notes from [place]; I will review these  IT appears that 10/05/13 he arrived in [place], [place]  And by 10/07/13 he developed cough, congestion and felt sick. It appears that CT scan of chest wihtin a few days of arrival   (10/07/13 per appt records he ha with him) did not show any penumonia (date and image NA but on its way from Dr Jeannine Kitten via [facility]). Review of some records he has with him shows urgent care visit 10/09/13 with sudden onset cough x 1 day and hypoxemia. He refused admission, ER visit and CXR. He then says, he continued to be sick and on 10/15/13 he showed me a CXR report at [facility], [place] showing new Left Upper Mid Zone pneumonia (he did not show me this at last visit). He arrived back in [place] on 51/3/15 and was taken straight to local ER where pneumonia was diagnosed and giving levaquin (meanwhile  Dr Jeannine Kitten had called in  on agumenting + doxy but he never took this). When I saw him 10/22/13 advised him to continue levaquin 5 more days. Same day PCP [name], MD did CXR that showed LUL mass like infitlrate. Then on 10/31/13 had CT chest whtat showed 4.7cm lingular.left upper lobe mass like density. Our radiolgoist concerned it was cancer. I then contacted Dr Jeannine Kitten who reviewed her CT and images of 10/31/13 I ahd emailed; the current mass like density is NEW. They called me in panic. Wife was upset and felt he needed more abx. I initially wanted him on augmentin but he told my triage he did not have this anymore. So we called in levaquin again for 7 days.   Now 2-3 more days of levaquin remaining. Wife says cough, congestion are improved but still with decreased po, decreased appetite and also weight loss of 20# since arriving in [place] 10/05/13 one month ago . HE is complaining of persistent vertigo   Filed Weights   11/05/13 1617  Weight: 183 lb 9.6 oz (83.28 kg)       Review of Systems  Constitutional: Negative for fever and unexpected weight change.  HENT: Negative for congestion, dental problem, ear pain, nosebleeds, postnasal drip, rhinorrhea, sinus pressure, sneezing, sore throat and trouble swallowing.   Eyes: Negative for redness and itching.  Respiratory: Positive for shortness of breath. Negative for cough, chest tightness and wheezing.   Cardiovascular: Negative for palpitations and leg swelling.  Gastrointestinal: Negative for nausea and vomiting.  Genitourinary: Negative for dysuria.  Musculoskeletal: Negative for joint swelling.  Skin: Negative for rash.  Neurological: Negative for headaches.  Hematological: Does not bruise/bleed easily.  Psychiatric/Behavioral: Negative for dysphoric mood. The patient is not nervous/anxious.        Objective:   Physical Exam  Nursing note and vitals reviewed. Constitutional: He is oriented to person, place, and time. He appears  well-developed and well-nourished. No distress.  HENT:  Head: Normocephalic and atraumatic.  Right Ear: External ear normal.  Left Ear: External ear normal.  Mouth/Throat: Oropharynx is clear and moist. No oropharyngeal exudate.  Puffs and breathjes  Eyes: Conjunctivae and EOM are normal. Pupils are equal, round, and reactive to light. Right eye exhibits no discharge. Left eye exhibits no discharge. No scleral icterus.  Neck: Normal range of motion. Neck supple. No JVD present. No tracheal deviation present. No thyromegaly present.  Cardiovascular: Normal rate, regular rhythm and intact distal pulses.  Exam reveals no gallop and no friction rub.   No murmur heard. Pulmonary/Chest: Effort normal and breath sounds normal. No respiratory distress. He has no wheezes. He has no rales. He exhibits no tenderness.  Kyphotic ? Crackles Left upper zone  Abdominal: Soft. Bowel sounds are normal. He exhibits no distension and no mass. There is no tenderness. There is no rebound and no guarding.  Musculoskeletal: Normal range of motion. He exhibits no edema and no tenderness.  Lymphadenopathy:    He has no cervical adenopathy.  Neurological: He is alert and oriented to person, place, and time. He has normal reflexes. No cranial nerve deficit. Coordination normal.  Skin: Skin is warm and dry. No rash noted. He is not diaphoretic. No erythema. No pallor.  Psychiatric: Judgment normal.     Filed Vitals:   11/05/13 1617  BP: 110/58  Pulse: 61  Height: 5\' 11"  (1.803 m)  Weight: 183 lb 9.6 oz (83.28 kg)  SpO2: 93%        Assessment & Plan:  Left upper lobe pneumonia  - odds are that this is new left upper lobe pneumonia developed while in [place], [place] mid-may 2015 (update after review of records suspect developed 10/09/13)  -doubt this is cancer  - please finish levaquin therapy - no role for biopsy right now - meanwhile I will review [employer]  Followup  -r eturn 3 weeks from now  with 2 view CXR; see me or my NP Tammy - check exertioonal pulse ox during followup

## 2013-11-06 ENCOUNTER — Telehealth: Payer: Self-pay | Admitting: Hematology and Oncology

## 2013-11-06 NOTE — Telephone Encounter (Signed)
due to meeting moved 6/8 appt time to 1030am per NG. lmonvm for pt. schedule mailed.

## 2013-11-08 DIAGNOSIS — J181 Lobar pneumonia, unspecified organism: Principal | ICD-10-CM

## 2013-11-08 DIAGNOSIS — J189 Pneumonia, unspecified organism: Secondary | ICD-10-CM | POA: Insufficient documentation

## 2013-11-08 NOTE — Assessment & Plan Note (Signed)
Left upper lobe pneumonia  - odds are that this is new left upper lobe pneumonia developed while in [place], [place] mid-may 2015 (update after review of records suspect developed 10/09/13)  -doubt this is cancer  - please finish levaquin therapy; I do not think he needs extended abx Rx beyond this - no role for biopsy right now - meanwhile I will review [employer] - monitor fatigue which could be a part of this  Followup  -r eturn 3 weeks from now with 2 view CXR; see me or my NP Tammy  > 50% of this > 25 min visit spent in face to face counseling (15 min visit converted to 25 min)

## 2013-11-11 ENCOUNTER — Other Ambulatory Visit: Payer: Self-pay | Admitting: Hematology and Oncology

## 2013-11-11 ENCOUNTER — Telehealth: Payer: Self-pay | Admitting: Hematology and Oncology

## 2013-11-11 ENCOUNTER — Encounter: Payer: Self-pay | Admitting: Hematology and Oncology

## 2013-11-11 ENCOUNTER — Ambulatory Visit (HOSPITAL_BASED_OUTPATIENT_CLINIC_OR_DEPARTMENT_OTHER): Payer: Medicare Other | Admitting: Hematology and Oncology

## 2013-11-11 VITALS — BP 130/43 | HR 57 | Temp 97.8°F | Resp 20 | Ht 71.0 in | Wt 184.0 lb

## 2013-11-11 DIAGNOSIS — D472 Monoclonal gammopathy: Secondary | ICD-10-CM

## 2013-11-11 DIAGNOSIS — R222 Localized swelling, mass and lump, trunk: Secondary | ICD-10-CM

## 2013-11-11 DIAGNOSIS — R918 Other nonspecific abnormal finding of lung field: Secondary | ICD-10-CM

## 2013-11-11 DIAGNOSIS — C9590 Leukemia, unspecified not having achieved remission: Secondary | ICD-10-CM

## 2013-11-11 NOTE — Progress Notes (Signed)
[facility] OFFICE PROGRESS NOTE  Patient Care Team: Janith Lima, MD as PCP - General (Internal Medicine) [name], MD as Attending Physician (Internal Medicine)  SUMMARY OF ONCOLOGIC HISTORY: This patient was seen by another hematologist approximately 7 months ago due to abnormal serum protein electrophoresis. 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 Recently, he went to another health Institute and was diagnosed with pneumonia and leukemia. He is being referred here because of the diagnosis of leukemia. He does not have any records with him and is not aware that time that he had.  INTERVAL HISTORY: Please see below for problem oriented charting. He has completed a course of antibiotics for pneumonia. He denies any recent fever, chills, night sweats or abnormal weight loss He denies new lymphadenopathy. The patient had history of skin cancer removed twice from his ear.  REVIEW OF SYSTEMS:   Constitutional: Denies fevers, chills or abnormal weight loss Eyes: Denies blurriness of vision Ears, nose, mouth, throat, and face: Denies mucositis or sore throat Respiratory: Denies cough, dyspnea or wheezes Gastrointestinal:  Denies nausea, heartburn or change in bowel habits Lymphatics: Denies new lymphadenopathy or easy bruising Neurological:Denies numbness, tingling or new weaknesses Behavioral/Psych: Mood is stable, no new changes  All other systems were reviewed with the patient and are negative.  I have reviewed the past medical history, past surgical history, social history and family history with the patient and they are unchanged from previous note.  ALLERGIES:  has No Known Allergies.  MEDICATIONS:  Current Outpatient Prescriptions  Medication Sig Dispense Refill  . aspirin EC 81 MG tablet Take 81 mg by mouth daily.      Marland Kitchen atorvastatin (LIPITOR) 10 MG tablet Take 10 mg by mouth daily.      . clopidogrel (PLAVIX) 75 MG  tablet TAKE ONE TABLET BY MOUTH ONCE DAILY  30 tablet  5  . levofloxacin (LEVAQUIN) 500 MG tablet Take 1 tablet (500 mg total) by mouth daily.  7 tablet  0  . levothyroxine (SYNTHROID, LEVOTHROID) 150 MCG tablet Take 1 tablet (150 mcg total) by mouth daily.  90 tablet  1  . metoprolol succinate (TOPROL-XL) 25 MG 24 hr tablet Take 25 mg by mouth daily.      . Multiple Vitamin (MULTIVITAMIN WITH MINERALS) TABS tablet Take 1 tablet by mouth daily.      . sildenafil (VIAGRA) 100 MG tablet Take 100 mg by mouth daily as needed for erectile dysfunction. Not to be taken with nitroglycerin       No current facility-administered medications for this visit.    PHYSICAL EXAMINATION: ECOG PERFORMANCE STATUS: 1 - Symptomatic but completely ambulatory  Filed Vitals:   11/11/13 1041  BP: 130/43  Pulse: 57  Temp: 97.8 F (36.6 C)  Resp: 20   Filed Weights   11/11/13 1041  Weight: 184 lb (83.462 kg)    GENERAL:alert, no distress and comfortable SKIN: Noticed significant solar keratosis. No abnormal moles. EYES: normal, Conjunctiva are pink and non-injected, sclera clear OROPHARYNX:no exudate, no erythema and lips, buccal mucosa, and tongue normal  NECK: supple, thyroid normal size, non-tender, without nodularity LYMPH:  no palpable lymphadenopathy in the cervical, axillary or inguinal LUNGS: clear to auscultation and percussion with normal breathing effort HEART: regular rate & rhythm and no murmurs and no lower extremity edema ABDOMEN:abdomen soft, non-tender and normal bowel sounds Musculoskeletal:no cyanosis of digits and no clubbing  NEURO: alert & oriented x 3 with fluent speech,  no focal motor/sensory deficits  LABORATORY DATA:  I have reviewed the data as listed    Component Value Date/Time   NA 136 10/22/2013 1218   K 4.1 10/22/2013 1218   CL 99 10/22/2013 1218   CO2 30 10/22/2013 1218   GLUCOSE 80 10/22/2013 1218   BUN 17 10/22/2013 1218   CREATININE 1.1 10/22/2013 1218   CREATININE  1.20 10/12/2010 1257   CALCIUM 8.7 10/22/2013 1218   PROT 5.6* 10/22/2013 1218   ALBUMIN 3.0* 10/22/2013 1218   AST 21 10/22/2013 1218   ALT 23 10/22/2013 1218   ALKPHOS 60 10/22/2013 1218   BILITOT 0.5 10/22/2013 1218   GFRNONAA 68.53 09/14/2009 1608   GFRAA  Value: >60        The eGFR has been calculated using the MDRD equation. This calculation has not been validated in all clinical situations. eGFR's persistently <60 mL/min signify possible Chronic Kidney Disease. 08/15/2009 0427    No results found for this basename: SPEP, UPEP,  kappa and lambda light chains    Lab Results  Component Value Date   WBC 12.2* 10/22/2013   NEUTROABS 10.4* 10/22/2013   HGB 17.1* 10/22/2013   HCT 50.8 10/22/2013   MCV 94.2 10/22/2013   PLT 344.0 10/22/2013      Chemistry      Component Value Date/Time   NA 136 10/22/2013 1218   K 4.1 10/22/2013 1218   CL 99 10/22/2013 1218   CO2 30 10/22/2013 1218   BUN 17 10/22/2013 1218   CREATININE 1.1 10/22/2013 1218   CREATININE 1.20 10/12/2010 1257      Component Value Date/Time   CALCIUM 8.7 10/22/2013 1218   ALKPHOS 60 10/22/2013 1218   AST 21 10/22/2013 1218   ALT 23 10/22/2013 1218   BILITOT 0.5 10/22/2013 1218     RADIOGRAPHIC STUDIES: I have reviewed his most recent CT scan myself. I have personally reviewed the radiological images as listed and agreed with the findings in the report.  ASSESSMENT & PLAN:  Leukemia I suspect he may have diagnosis of chronic lymphocytic leukemia. I have asked him to sign consent of release information so I can request them. I will call him back next week. If we do not have the information we need it, the patient is aware that my office will call him back to have blood work done to confirm the diagnosis.  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 Again, I am waiting for outside records.  Mass of lower lobe of left lung Imaging is reviewed by myself. I  felt that overall it is most consistent with pneumonia. He has completed a course of oral antibiotics therapy.  Rather than ordering a biopsy, I agree with other providers to observe and repeat imaging study in a few weeks.   Orders Placed This Encounter  Procedures  . CBC with Differential    Standing Status: Future     Number of Occurrences:      Standing Expiration Date: 11/11/2014  . Comprehensive metabolic panel    Standing Status: Future     Number of Occurrences:      Standing Expiration Date: 11/11/2014  . FISH, Peripheral Blood    CLL    Standing Status: Future     Number of Occurrences:      Standing Expiration Date: 11/11/2014  . Flow Cytometry    CLL    Standing Status: Future     Number  of Occurrences:      Standing Expiration Date: 11/11/2014  . IgG, IgA, IgM    Standing Status: Future     Number of Occurrences:      Standing Expiration Date: 11/11/2014  . Beta 2 microglobulin, serum    Standing Status: Future     Number of Occurrences:      Standing Expiration Date: 11/12/2014   All questions were answered. The patient knows to call the clinic with any problems, questions or concerns. No barriers to learning was detected.    Heath Lark, MD 11/11/2013 11:34 AM

## 2013-11-11 NOTE — Assessment & Plan Note (Signed)
Imaging is reviewed by myself. I felt that overall it is most consistent with pneumonia. He has completed a course of oral antibiotics therapy.  Rather than ordering a biopsy, I agree with other providers to observe and repeat imaging study in a few weeks.

## 2013-11-11 NOTE — Telephone Encounter (Signed)
Gave pt apptr for June MD only

## 2013-11-11 NOTE — Assessment & Plan Note (Signed)
I suspect he may have diagnosis of chronic lymphocytic leukemia. I have asked him to sign consent of release information so I can request them. I will call him back next week. If we do not have the information we need it, the patient is aware that my office will call him back to have blood work done to confirm the diagnosis.

## 2013-11-11 NOTE — Assessment & Plan Note (Signed)
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 Again, I am waiting for outside records.

## 2013-11-12 ENCOUNTER — Telehealth: Payer: Self-pay | Admitting: *Deleted

## 2013-11-12 NOTE — Telephone Encounter (Signed)
Message copied by Cathlean Cower on Tue Nov 12, 2013 10:10 AM ------      Message from: [facility], [place]      Created: Mon Nov 11, 2013  5:42 PM      Regarding: test from outside hospital       Pls let him know we have received everything we needed, no need to redraw labs. I will discuss everything with him on his return visit ------

## 2013-11-18 ENCOUNTER — Telehealth: Payer: Self-pay | Admitting: Family Medicine

## 2013-11-18 NOTE — Telephone Encounter (Signed)
Rec'd from [facility] forward 8 pages to [place]

## 2013-11-26 ENCOUNTER — Ambulatory Visit (INDEPENDENT_AMBULATORY_CARE_PROVIDER_SITE_OTHER)
Admission: RE | Admit: 2013-11-26 | Discharge: 2013-11-26 | Disposition: A | Discharge status: Home / Self Care | Payer: Medicare Other | Source: Ambulatory Visit | Attending: Internal Medicine | Admitting: Internal Medicine

## 2013-11-26 ENCOUNTER — Other Ambulatory Visit: Payer: Self-pay | Admitting: *Deleted

## 2013-11-26 ENCOUNTER — Encounter: Payer: Self-pay | Admitting: Internal Medicine

## 2013-11-26 ENCOUNTER — Ambulatory Visit (INDEPENDENT_AMBULATORY_CARE_PROVIDER_SITE_OTHER): Payer: Medicare Other | Admitting: Internal Medicine

## 2013-11-26 VITALS — BP 128/80 | HR 60 | Ht 71.0 in | Wt 189.9 lb

## 2013-11-26 DIAGNOSIS — J189 Pneumonia, unspecified organism: Secondary | ICD-10-CM

## 2013-11-26 DIAGNOSIS — J181 Lobar pneumonia, unspecified organism: Secondary | ICD-10-CM

## 2013-11-26 NOTE — Progress Notes (Signed)
Subjective:    Patient ID: Adrian West, male    DOB: July 31, 1927, 80 y.o.   MRN: [id number]  HPI   hjad seen him 10/22/13 as followup from his visit to [place], [place]. 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 Instead she was concerned about possible a) aspiration and b) ILD c) CLL. Precise notes are unavailable at this time as well although as he was exiting the office I noticed he had the GI notes from [place]; I will review these  IT appears that 10/05/13 he arrived in [place], [place]  And by 10/07/13 he developed cough, congestion and felt sick. It appears that CT scan of chest wihtin a few days of arrival   (10/07/13 per appt records he ha with him) did not show any penumonia (date and image NA but on its way from Dr Jeannine Kitten via [facility]). Review of some records he has with him shows urgent care visit 10/09/13 with sudden onset cough x 1 day and hypoxemia. He refused admission, ER visit and CXR. He then says, he continued to be sick and on 10/15/13 he showed me a CXR report at [facility], [place] showing new Left Upper Mid Zone pneumonia (he did not show me this at last visit). He arrived back in [place] on 51/3/15 and was taken straight to local ER where pneumonia was diagnosed and giving levaquin (meanwhile Dr Jeannine Kitten had called in  on agumenting + doxy but he never took this). When I saw him 10/22/13 advised him to continue levaquin 5 more days. Same day PCP [name], MD did CXR that showed LUL mass like infitlrate. Then on 10/31/13 had CT chest whtat showed 4.7cm lingular.left upper lobe mass like density. Our radiolgoist concerned it was cancer. I then contacted Dr Jeannine Kitten who reviewed her CT and images of 10/31/13 I ahd emailed; the current mass like density is NEW. They called me in panic. Wife was upset and felt he needed more abx. I initially wanted him on augmentin but he  told my triage he did not have this anymore. So we called in levaquin again for 7 days.   Now 2-3 more days of levaquin remaining. Wife says cough, congestion are improved but still with decreased po, decreased appetite and also weight loss of 20# since arriving in [place] 10/05/13 one month ago . HE is complaining of persistent vertigo   OV 11/26/2013  Chief Complaint  Patient presents with  . Follow-up    CXR done today--Still having sob with exertion.  Denies wheezing, tightness or cough. Discuss results of testing done at [facility] in [place]   He is feeling better after his pneumonia Rx. In fact he says best he has felt in long time. I only got thevarious records of his [place] workup few to several days ago. There is a stack of them; not reviewed them yet. In any event, need to have this pneumonia resolve completely before implementing their recommendation. CXR 11/26/13 shos significant  Improvement but radiology is concerned about underlying mass; DR Jeannine Kitten over phone had not indicated presence of mass   No results found. Dg Chest 2 View  11/26/2013   CLINICAL DATA:  Followup pneumonia.  EXAM: CHEST  2 VIEW  COMPARISON:  10/22/2013, 09/17/2012 and chest CT 10/31/2013  FINDINGS: Lungs are adequately inflated with chronic bibasilar interstitial disease. There  is continued opacification of the left perihilar/lingular region slightly improved. No definite effusion. Cardiomediastinal silhouette and remainder of the exam is unchanged.  IMPRESSION: Improving left perihilar/lingular opacification suggesting improving infection, however cannot exclude underlying mass as suggested on recent chest CT. Recommend continued followup to resolution and followup CT if this abnormality persists.  Chronic bibasilar interstitial disease.   Electronically Signed   By: Marin Olp M.D.   On: 11/26/2013 10:02     Review of Systems  Constitutional: Negative for fever and unexpected weight change.  HENT:  Negative for congestion, dental problem, ear pain, nosebleeds, postnasal drip, rhinorrhea, sinus pressure, sneezing, sore throat and trouble swallowing.   Eyes: Negative for redness and itching.  Respiratory: Positive for shortness of breath. Negative for cough, chest tightness and wheezing.   Cardiovascular: Positive for leg swelling. Negative for palpitations.  Gastrointestinal: Negative for nausea and vomiting.  Genitourinary: Negative for dysuria.  Musculoskeletal: Negative for joint swelling.  Skin: Negative for rash.  Neurological: Negative for headaches.  Hematological: Bruises/bleeds easily.  Psychiatric/Behavioral: Negative for dysphoric mood. The patient is not nervous/anxious.        Objective:   Physical Exam     vitals reviewed. All below are unchanged Constitutional: looks improved  HENT:  Head: Normocephalic and atraumatic.  Right Ear: External ear normal.  Left Ear: External ear normal.  Mouth/Throat: Oropharynx is clear and moist. No oropharyngeal exudate.  Eyes: Conjunctivae and EOM are normal. Pupils are equal, round, and reactive to light. Right eye exhibits no discharge. Left eye exhibits no discharge. No scleral icterus.  Neck: Normal range of motion. Neck supple. No JVD present. No tracheal deviation present. No thyromegaly present.  Cardiovascular: Normal rate, regular rhythm and intact distal pulses.  Exam reveals no gallop and no friction rub.   No murmur heard. Pulmonary/Chest: Effort normal. No respiratory distress. He has no wheezes. He has no rales. He exhibits no tenderness.  HE IS NOT PUFFING and Abdominal: Soft. Bowel sounds are normal. He exhibits no distension and no mass. There is no tenderness. There is no rebound and no guarding.  Musculoskeletal: Normal range of motion. He exhibits no edema and no tenderness.       Walks with hunch  Lymphadenopathy:    He has no cervical adenopathy.  Neurological: He is alert and oriented to person, place,  and time. He has normal reflexes. No cranial nerve deficit. Coordination normal.       Mild LUE tremor  Skin: Skin is warm and dry. No rash noted. He is not diaphoretic. No erythema. No pallor.  Psychiatric: He has a normal mood and affect. His behavior is normal. Judgment and thought content normal.

## 2013-11-26 NOTE — Patient Instructions (Addendum)
Left upper lobe pneumonia   -this is better on CXR 11/26/2013 - Do followup CT chest with contrast first week August 2015  #Shortness of breath  - let me review Dr Jeannine Kitten notes from [facility] and create a plan for you by time of your next visit  #Possible CLL  - my office will call oncology for you to see if they can see you today/tomorrow instead of later in month    Followup  early August 2015 after CT chest

## 2013-11-28 ENCOUNTER — Telehealth: Payer: Self-pay | Admitting: Hematology and Oncology

## 2013-11-28 NOTE — Telephone Encounter (Signed)
lmonvm for pt re appt for 8/3. schedule mailed.

## 2013-11-29 ENCOUNTER — Ambulatory Visit: Payer: Medicare Other | Admitting: Hematology and Oncology

## 2013-12-19 ENCOUNTER — Telehealth: Payer: Self-pay | Admitting: Internal Medicine

## 2013-12-19 NOTE — Telephone Encounter (Signed)
Spoke with Adrian West at [facility] is scheduled for ct chest on 01/10/14  She states that the pt is due for f/u on renal cryoablation and they are asking if they can add on ct abd at the same time as CT chest She is aware MR out of the office for the next 2 wks  Please advise thanks

## 2013-12-19 NOTE — Assessment & Plan Note (Signed)
Left upper lobe pneumonia   -this is better on CXR 11/26/2013 - Do followup CT chest with contrast first week August 2015  #Shortness of breath  - let me review Dr Jeannine Kitten notes from [facility] and create a plan for you by time of your next visit  #Possible CLL  - my office will call oncology for you to see if they can see you today/tomorrow instead of later in month    Followup  early August 2015 after CT chest

## 2013-12-19 NOTE — Assessment & Plan Note (Signed)
Will let him finish abx for recent pna at [facility].Will await records from [facility] so I can implement the plan

## 2013-12-23 ENCOUNTER — Other Ambulatory Visit: Payer: Self-pay | Admitting: Physician Assistant

## 2014-01-03 ENCOUNTER — Telehealth: Payer: Self-pay

## 2014-01-03 ENCOUNTER — Telehealth: Payer: Self-pay | Admitting: *Deleted

## 2014-01-03 NOTE — Telephone Encounter (Signed)
Left message for pt to call back  °

## 2014-01-03 NOTE — Telephone Encounter (Signed)
Dr [name] reviewed reports from [facility].  Recommend:  --repeat sleep study --lexiscan cardiolite to rule out ischemia  We can talk about RHC at follow up after cardiolite.   LMTCB for pt

## 2014-01-03 NOTE — Telephone Encounter (Signed)
Message copied by Algernon Huxley on Fri Jan 03, 2014 12:58 PM ------      Message from: Irene Shipper      Created: Thu Jan 02, 2014 12:12 PM      Regarding: recommendation and follow up       Please let the patient know that I have carefully reviewed the considerable volume of outside records that he dropped off for me to "review and give him advice". I recommend omeprazole 40 mg daily for acid reflux (please prescribe). As well routine office follow up in 6 weeks or so. I will discuss further with him at that time. Thanks .Marland Kitchen Please convert to phone note for the records ------

## 2014-01-06 ENCOUNTER — Other Ambulatory Visit: Payer: Medicare Other

## 2014-01-06 ENCOUNTER — Other Ambulatory Visit (HOSPITAL_COMMUNITY): Payer: Self-pay | Admitting: Interventional Radiology

## 2014-01-06 ENCOUNTER — Ambulatory Visit: Payer: Medicare Other | Admitting: Hematology and Oncology

## 2014-01-06 ENCOUNTER — Other Ambulatory Visit: Payer: Self-pay | Admitting: Internal Medicine

## 2014-01-06 DIAGNOSIS — N2889 Other specified disorders of kidney and ureter: Secondary | ICD-10-CM

## 2014-01-06 NOTE — Telephone Encounter (Signed)
Called spoke with Tammy. Aware of recs. Nothing further needed

## 2014-01-06 NOTE — Telephone Encounter (Signed)
Adrian West w/ [facility] returned triage's call & can be reached at [phone].  Satira Anis

## 2014-01-06 NOTE — Telephone Encounter (Signed)
LMTCB

## 2014-01-06 NOTE — Telephone Encounter (Signed)
Left message for pt to call back  °

## 2014-01-06 NOTE — Telephone Encounter (Signed)
Ok to combine imaging on same date. I think order for CT abdomen comes from Dr Devoria Albe of [facility]

## 2014-01-06 NOTE — Telephone Encounter (Signed)
lmtcb x 1 with [facility]

## 2014-01-07 MED ORDER — OMEPRAZOLE 40 MG PO CPDR: 40.0000 mg | DELAYED_RELEASE_CAPSULE | Freq: Every day | ORAL | Status: DC

## 2014-01-07 NOTE — Telephone Encounter (Signed)
LMTCB

## 2014-01-07 NOTE — Telephone Encounter (Signed)
Unable to reach pt. Email sent to pt, script sent to the pharmacy and pt scheduled to see Dr. Henrene Pastor 02/17/14@2 :15pm.

## 2014-01-09 ENCOUNTER — Telehealth: Payer: Self-pay | Admitting: Internal Medicine

## 2014-01-09 NOTE — Telephone Encounter (Signed)
Message copied by Greggory Keen on Thu Jan 09, 2014 11:20 AM ------      Message from: Irene Shipper      Created: Thu Jan 09, 2014  9:58 AM       There is no good data supporting that claim. Old information. Thank you for the notification, however. No changes      ----- Message -----         From: Virgina Evener, LPN         Sent: 07/14/1186   7:51 AM           To: Irene Shipper, MD            Pharmacy has called about a drug interaction between Omeprazole and Plavix.      Pharmacist suggests Nexium or Protonix. Please advise.       ------

## 2014-01-09 NOTE — Telephone Encounter (Signed)
Left vm for pt to call back, need more information about this.

## 2014-01-09 NOTE — Telephone Encounter (Signed)
Mrs. Adrian West requesting phone call from Dr. Ronnald Ramp or his assistant concern about [name] right cranial possible cancer. Please call her back.

## 2014-01-09 NOTE — Telephone Encounter (Signed)
Pharmacy notified that it is okay to continue the patient's PPI per [name]

## 2014-01-10 ENCOUNTER — Ambulatory Visit (INDEPENDENT_AMBULATORY_CARE_PROVIDER_SITE_OTHER)
Admission: RE | Admit: 2014-01-10 | Discharge: 2014-01-10 | Disposition: A | Discharge status: Home / Self Care | Payer: Medicare Other | Source: Ambulatory Visit | Attending: Internal Medicine | Admitting: Internal Medicine

## 2014-01-10 ENCOUNTER — Ambulatory Visit (INDEPENDENT_AMBULATORY_CARE_PROVIDER_SITE_OTHER): Payer: Medicare Other | Admitting: Internal Medicine

## 2014-01-10 ENCOUNTER — Other Ambulatory Visit (INDEPENDENT_AMBULATORY_CARE_PROVIDER_SITE_OTHER): Payer: Medicare Other

## 2014-01-10 ENCOUNTER — Other Ambulatory Visit: Payer: Self-pay | Admitting: Otolaryngology

## 2014-01-10 ENCOUNTER — Other Ambulatory Visit: Payer: Self-pay | Admitting: Hematology and Oncology

## 2014-01-10 ENCOUNTER — Encounter: Payer: Self-pay | Admitting: Hematology and Oncology

## 2014-01-10 ENCOUNTER — Ambulatory Visit (HOSPITAL_BASED_OUTPATIENT_CLINIC_OR_DEPARTMENT_OTHER): Payer: Medicare Other | Admitting: Hematology and Oncology

## 2014-01-10 ENCOUNTER — Ambulatory Visit
Admission: RE | Admit: 2014-01-10 | Discharge: 2014-01-10 | Disposition: A | Discharge status: Home / Self Care | Payer: Medicare Other | Source: Ambulatory Visit | Attending: Internal Medicine | Admitting: Internal Medicine

## 2014-01-10 ENCOUNTER — Encounter: Payer: Self-pay | Admitting: Internal Medicine

## 2014-01-10 VITALS — BP 131/67 | HR 66 | Temp 97.9°F | Resp 18 | Ht 71.0 in | Wt 186.8 lb

## 2014-01-10 VITALS — BP 142/90 | HR 72 | Ht 71.0 in | Wt 186.0 lb

## 2014-01-10 DIAGNOSIS — J181 Lobar pneumonia, unspecified organism: Principal | ICD-10-CM

## 2014-01-10 DIAGNOSIS — J189 Pneumonia, unspecified organism: Secondary | ICD-10-CM

## 2014-01-10 DIAGNOSIS — K119 Disease of salivary gland, unspecified: Secondary | ICD-10-CM

## 2014-01-10 DIAGNOSIS — H4911 Fourth [trochlear] nerve palsy, right eye: Secondary | ICD-10-CM

## 2014-01-10 DIAGNOSIS — R0600 Dyspnea, unspecified: Secondary | ICD-10-CM

## 2014-01-10 DIAGNOSIS — D72820 Lymphocytosis (symptomatic): Secondary | ICD-10-CM

## 2014-01-10 DIAGNOSIS — R0989 Other specified symptoms and signs involving the circulatory and respiratory systems: Secondary | ICD-10-CM

## 2014-01-10 DIAGNOSIS — N289 Disorder of kidney and ureter, unspecified: Secondary | ICD-10-CM

## 2014-01-10 DIAGNOSIS — R0609 Other forms of dyspnea: Secondary | ICD-10-CM

## 2014-01-10 DIAGNOSIS — R29810 Facial weakness: Secondary | ICD-10-CM

## 2014-01-10 DIAGNOSIS — K118 Other diseases of salivary glands: Secondary | ICD-10-CM

## 2014-01-10 DIAGNOSIS — G510 Bell's palsy: Secondary | ICD-10-CM | POA: Insufficient documentation

## 2014-01-10 DIAGNOSIS — D751 Secondary polycythemia: Secondary | ICD-10-CM

## 2014-01-10 DIAGNOSIS — N2889 Other specified disorders of kidney and ureter: Secondary | ICD-10-CM

## 2014-01-10 DIAGNOSIS — G519 Disorder of facial nerve, unspecified: Secondary | ICD-10-CM

## 2014-01-10 HISTORY — DX: Secondary polycythemia: D75.1

## 2014-01-10 LAB — BASIC METABOLIC PANEL
BUN: 17 mg/dL (ref 6–23)
CO2: 25 meq/L (ref 19–32)
CREATININE: 1 mg/dL (ref 0.4–1.5)
Calcium: 8.5 mg/dL (ref 8.4–10.5)
Chloride: 106 mEq/L (ref 96–112)
GFR: 77.47 mL/min (ref >60.00)
Glucose, Bld: 94 mg/dL (ref 70–99)
POTASSIUM: 4.4 meq/L (ref 3.5–5.1)
SODIUM: 138 meq/L (ref 135–145)

## 2014-01-10 MED ORDER — IOHEXOL 300 MG/ML  SOLN
100.0000 mL | Freq: Once | INTRAMUSCULAR | Status: AC | PRN
  Administered 2014-01-10: 100 mL via INTRAVENOUS

## 2014-01-10 NOTE — Assessment & Plan Note (Signed)
He will need urgent imaging and ENT evaluation. I called his prior ENT surgeon for an appointment today.

## 2014-01-10 NOTE — Assessment & Plan Note (Signed)
This is of unknown significance.

## 2014-01-10 NOTE — Telephone Encounter (Signed)
Spoke with pt, he has an appt today at 2 with Dr. [name] today, per Dr. Ronnald Ramp, pt can speak to Dr. [name] about this problem as well. Pt is aware.

## 2014-01-10 NOTE — Assessment & Plan Note (Signed)
This could be due to infiltration of compression of the right cranial nerve. As mentioned above, I recommend urgent ENT evaluation.

## 2014-01-10 NOTE — Assessment & Plan Note (Signed)
This is due to his diaphragmatic paralysis. I told him not to be too concerned about this.

## 2014-01-10 NOTE — Progress Notes (Signed)
Subjective:    Patient ID: Adrian West, male    DOB: Apr 27, 1928, 80 y.o.   MRN: [id number]  HPI    hjad seen him 10/22/13 as followup from his visit to [place], [place]. 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 Instead she was concerned about possible a) aspiration and b) ILD c) CLL. Precise notes are unavailable at this time as well although as he was exiting the office I noticed he had the GI notes from [place]; I will review these  IT appears that 10/05/13 he arrived in [place], [place]  And by 10/07/13 he developed cough, congestion and felt sick. It appears that CT scan of chest wihtin a few days of arrival   (10/07/13 per appt records he ha with him) did not show any penumonia (date and image NA but on its way from Dr Jeannine Kitten via [facility]). Review of some records he has with him shows urgent care visit 10/09/13 with sudden onset cough x 1 day and hypoxemia. He refused admission, ER visit and CXR. He then says, he continued to be sick and on 10/15/13 he showed me a CXR report at [facility], [place] showing new Left Upper Mid Zone pneumonia (he did not show me this at last visit). He arrived back in [place] on 51/3/15 and was taken straight to local ER where pneumonia was diagnosed and giving levaquin (meanwhile Dr Jeannine Kitten had called in  on agumenting + doxy but he never took this). When I saw him 10/22/13 advised him to continue levaquin 5 more days. Same day PCP [name], MD did CXR that showed LUL mass like infitlrate. Then on 10/31/13 had CT chest whtat showed 4.7cm lingular.left upper lobe mass like density. Our radiolgoist concerned it was cancer. I then contacted Dr Jeannine Kitten who reviewed her CT and images of 10/31/13 I ahd emailed; the current mass like density is NEW. They called me in panic. Wife was upset and felt he needed more abx. I initially wanted him on augmentin but he  told my triage he did not have this anymore. So we called in levaquin again for 7 days.   Now 2-3 more days of levaquin remaining. Wife says cough, congestion are improved but still with decreased po, decreased appetite and also weight loss of 20# since arriving in [place] 10/05/13 one month ago . HE is complaining of persistent vertigo   OV 11/26/2013  Chief Complaint  Patient presents with  . Follow-up    CXR done today--Still having sob with exertion.  Denies wheezing, tightness or cough. Discuss results of testing done at [facility] in [place]   He is feeling better after his pneumonia Rx. In fact he says best he has felt in long time. I only got thevarious records of his [place] workup few to several days ago. There is a stack of them; not reviewed them yet. In any event, need to have this pneumonia resolve completely before implementing their recommendation. CXR 11/26/13 shos significant  Improvement but radiology is concerned about underlying mass; DR Jeannine Kitten over phone had not indicated presence of mass   No results found. Dg Chest 2 View  11/26/2013   CLINICAL DATA:  Followup pneumonia.  EXAM: CHEST  2 VIEW  COMPARISON:  10/22/2013, 09/17/2012 and chest CT 10/31/2013  FINDINGS: Lungs are adequately inflated with chronic bibasilar interstitial disease.  There is continued opacification of the left perihilar/lingular region slightly improved. No definite effusion. Cardiomediastinal silhouette and remainder of the exam is unchanged.  IMPRESSION: Improving left perihilar/lingular opacification suggesting improving infection, however cannot exclude underlying mass as suggested on recent chest CT. Recommend continued followup to resolution and followup CT if this abnormality persists.  Chronic bibasilar interstitial disease.   Electronically Signed   By: Marin Olp M.D.   On: 11/26/2013 10:02   OV 01/10/2014  Chief Complaint  Patient presents with  . Follow-up    Pt here after CT scan.  Pt states he has no change in breathing. Denies CP and cough. C/o doe.     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   Since June 2015 visits are followup of a [facility] 2015  [facility] [place] [facility]. Then had several series of recommendations for [place]  to continue in terms of dyspnea workup and mgmt. Mostly it seems that diaphragm function is improved and they are recommending re-testing his heart completely (right heart and left). However, this has been on hold due to him developing left upper lobe pneumonia during trip to [place], [place] in May 2015. Today he is here with followup of the CT scan for followup of pneumonia. The official report is pending but in my opinion the pneumonia is 90% resolved. He still has basal interstitial lung disease changes possibly. Also of note he did have a CT abdomen to evaluate his renal mass which is status post cryoablation the official results of this are also pending.  NEW ISSUE: Off note, in the last 10 days he has developed acute onset left eyelid twitching along with right parotid swelling without any pain. This was supported by his hematologist Steele Sizer is undergoing CLL evaluation] today and he has an appointment with ENT pending today   CT Ct Chest W Contrast  01/10/2014   CLINICAL DATA:  Left upper lobe pneumonia, left renal mass status post renal ablation.  EXAM: CT CHEST WITH CONTRAST  CT ABDOMEN AND PELVIS WITH CONTRAST  TECHNIQUE: Multidetector CT imaging of the chest was performed during intravenous contrast administration. Multidetector CT imaging of the abdomen and pelvis was performed following bolus administration of intravenous contrast in the corticomedullary and nephrographic phases.  CONTRAST:  181mL OMNIPAQUE IOHEXOL 300 MG/ML  SOLN  COMPARISON:  09/18/2012 abdominal pelvic CT, 10/31/2013 chest CT  FINDINGS: CT CHEST FINDINGS  Scattered atherosclerotic  disease of the aorta and branch vessels without aneurysmal dilatation.  Dilatation of the right pulmonary artery up to 3.3 cm and left pulmonary artery up to 3.8 cm. The main pulmonary artery measures 2.3 cm.  Normal heart size. Mitral valve replacement. Coronary artery calcifications. Trace pericardial fluid.  No pleural effusion. Sub carinal lymph node has decreased in size, measuring 1.5 cm short axis on series 5, image 33 as compared to 1.7 cm short axis on the prior.  Central airways are patent. No pneumothorax.  Interval improved aeration of the left upper lobe paramediastinal consolidation, with mild residual linear opacity.  Mild centrilobular emphysema. Bibasilar airspace and peripheral reticular opacities are similar to prior, favored to reflect scarring. Calcific density adjacent to the right heart margin on image 44 is unchanged.  Bilateral shoulder degenerative changes multilevel degenerative changes of the spine.  CT ABDOMEN AND PELVIS FINDINGS  Segment 4 hypodensity on image 55, nonspecific, favored to reflect scarring or focal fat. Lobular liver contour. Cholecystectomy.  No biliary ductal dilatation.  Fatty atrophy of the pancreas. No appreciable abnormality of the spleen or right adrenal gland. Mild left adrenal nodularity on image 64 series 5 is similar to prior.  Similar appearance to the kidneys. There is an upper/ interpolar hypodensity on the left measuring sub cm and an exophytic hypodensity with calcified rim arising from the lower pole left kidney. Soft tissue attenuation component along the superior rim (coronal image 76-79/146. No hydroureteronephrosis.  Colonic diverticulosis. No CT evidence for colitis or diverticulitis. Appendix not confidently identified. No right lower quadrant inflammation. Small bowel loops are of normal course and caliber. Tiny fat containing umbilical hernia.  Thin walled bladder. Prostate gland measures 4.9 cm transverse diameter. Small fat containing left  inguinal hernia. New right SI joint ankylosis.  Advanced multilevel degenerative changes. Sequelae of prior right L3 transverse process fracture. L2-L4 partial fusion.  IMPRESSION: Improved aeration of the left upper lobe with mild linear opacity, most in keeping with resolving pneumonia with mild residual infiltrate versus scarring.  Decreased size of mediastinal lymph nodes, with a subcarinal node measuring 1.5 cm short axis warranting continued attention on follow-up.  Dilated right and left pulmonary arteries may reflect pulmonary arterial hypertension. Bilateral lower lobe opacities/ scarring persists.  Post ablation changes lower pole left kidney. Higher attenuation component along the superior rim is indeterminate (likely post treatment however cannot exclude a residual enhancing component as no unenhanced images were obtained). Recommend renal MRI.  Indeterminate sub cm hypodensity upper/ interpolar left kidney, unchanged.   Electronically Signed   By: Carlos Levering M.D.   On: 01/10/2014 16:27   Ct Abd Wo & W Cm  01/10/2014   CLINICAL DATA:  Left upper lobe pneumonia, left renal mass status post renal ablation.  EXAM: CT CHEST WITH CONTRAST  CT ABDOMEN AND PELVIS WITH CONTRAST  TECHNIQUE: Multidetector CT imaging of the chest was performed during intravenous contrast administration. Multidetector CT imaging of the abdomen and pelvis was performed following bolus administration of intravenous contrast in the corticomedullary and nephrographic phases.  CONTRAST:  123mL OMNIPAQUE IOHEXOL 300 MG/ML  SOLN  COMPARISON:  09/18/2012 abdominal pelvic CT, 10/31/2013 chest CT  FINDINGS: CT CHEST FINDINGS  Scattered atherosclerotic disease of the aorta and branch vessels without aneurysmal dilatation.  Dilatation of the right pulmonary artery up to 3.3 cm and left pulmonary artery up to 3.8 cm. The main pulmonary artery measures 2.3 cm.  Normal heart size. Mitral valve replacement. Coronary artery calcifications.  Trace pericardial fluid.  No pleural effusion. Sub carinal lymph node has decreased in size, measuring 1.5 cm short axis on series 5, image 33 as compared to 1.7 cm short axis on the prior.  Central airways are patent. No pneumothorax.  Interval improved aeration of the left upper lobe paramediastinal consolidation, with mild residual linear opacity.  Mild centrilobular emphysema. Bibasilar airspace and peripheral reticular opacities are similar to prior, favored to reflect scarring. Calcific density adjacent to the right heart margin on image 44 is unchanged.  Bilateral shoulder degenerative changes multilevel degenerative changes of the spine.  CT ABDOMEN AND PELVIS FINDINGS  Segment 4 hypodensity on image 55, nonspecific, favored to reflect scarring or focal fat. Lobular liver contour. Cholecystectomy. No biliary ductal dilatation.  Fatty atrophy of the pancreas. No appreciable abnormality of the spleen or right adrenal gland. Mild left adrenal nodularity on image 64 series 5 is similar to prior.  Similar appearance to the kidneys. There is an upper/ interpolar hypodensity on the left measuring  sub cm and an exophytic hypodensity with calcified rim arising from the lower pole left kidney. Soft tissue attenuation component along the superior rim (coronal image 76-79/146. No hydroureteronephrosis.  Colonic diverticulosis. No CT evidence for colitis or diverticulitis. Appendix not confidently identified. No right lower quadrant inflammation. Small bowel loops are of normal course and caliber. Tiny fat containing umbilical hernia.  Thin walled bladder. Prostate gland measures 4.9 cm transverse diameter. Small fat containing left inguinal hernia. New right SI joint ankylosis.  Advanced multilevel degenerative changes. Sequelae of prior right L3 transverse process fracture. L2-L4 partial fusion.  IMPRESSION: Improved aeration of the left upper lobe with mild linear opacity, most in keeping with resolving pneumonia with  mild residual infiltrate versus scarring.  Decreased size of mediastinal lymph nodes, with a subcarinal node measuring 1.5 cm short axis warranting continued attention on follow-up.  Dilated right and left pulmonary arteries may reflect pulmonary arterial hypertension. Bilateral lower lobe opacities/ scarring persists.  Post ablation changes lower pole left kidney. Higher attenuation component along the superior rim is indeterminate (likely post treatment however cannot exclude a residual enhancing component as no unenhanced images were obtained). Recommend renal MRI.  Indeterminate sub cm hypodensity upper/ interpolar left kidney, unchanged.   Electronically Signed   By: Carlos Levering M.D.   On: 01/10/2014 16:27     Review of Systems  Constitutional: Negative for fever and unexpected weight change.  HENT: Negative for congestion, dental problem, ear pain, nosebleeds, postnasal drip, rhinorrhea, sinus pressure, sneezing, sore throat and trouble swallowing.   Eyes: Negative for redness and itching.  Respiratory: Positive for shortness of breath. Negative for cough, chest tightness and wheezing.   Cardiovascular: Negative for palpitations and leg swelling.  Gastrointestinal: Negative for nausea and vomiting.  Genitourinary: Negative for dysuria.  Musculoskeletal: Negative for joint swelling.  Skin: Negative for rash.  Neurological: Negative for headaches.  Hematological: Does not bruise/bleed easily.  Psychiatric/Behavioral: Negative for dysphoric mood. The patient is not nervous/anxious.        Objective:   Physical Exam Filed Vitals:   01/10/14 1413  BP: 142/90  Pulse: 72  Height: 5\' 11"  (1.803 m)  Weight: 186 lb (84.369 kg)  SpO2: 97%     vitals reviewed. All below are unchanged Constitutional:  HENT:  RT PAROTID SWELLING, SOFT, NON TENDER, LEFT EYE LID TWITCHES - NEW Head: Normocephalic and atraumatic.  Right Ear: External ear normal.  Left Ear: External ear normal.    Mouth/Throat: Oropharynx is clear and moist. No oropharyngeal exudate.  Eyes: Conjunctivae and EOM are normal. Pupils are equal, round, and reactive to light. Right eye exhibits no discharge. Left eye exhibits no discharge. No scleral icterus.  Neck: Normal range of motion. Neck supple. No JVD present. No tracheal deviation present. No thyromegaly present.  Cardiovascular: Normal rate, regular rhythm and intact distal pulses.  Exam reveals no gallop and no friction rub.   No murmur heard. Pulmonary/Chest: Effort normal. No respiratory distress. He has no wheezes. He has no rales. He exhibits no tenderness.  HE IS NOT PUFFING  Abdominal: Soft. Bowel sounds are normal. He exhibits no distension and no mass. There is no tenderness. There is no rebound and no guarding.  Musculoskeletal: Normal range of motion. He exhibits no edema and no tenderness.       Walks with hunch  Lymphadenopathy:    He has no cervical adenopathy.  Neurological: He is alert and oriented to person, place, and time. He has normal reflexes. No  cranial nerve deficit. Coordination normal.       Mild LUE tremor  Skin: Skin is warm and dry. No rash noted. He is not diaphoretic. No erythema. No pallor.  Psychiatric: He has a normal mood and affect. His behavior is normal. Judgment and thought content normal.             Assessment & Plan:

## 2014-01-10 NOTE — Progress Notes (Signed)
[facility] OFFICE PROGRESS NOTE  Patient Care Team: Janith Lima, MD as PCP - General (Internal Medicine) [name], MD as Attending Physician (Internal Medicine) [name], MD as Consulting Physician (Otolaryngology) Rob Hickman, MD as Consulting Physician (Ophthalmology)  SUMMARY OF ONCOLOGIC HISTORY: This patient was seen by another hematologist approximately 7 months ago due to abnormal serum protein electrophoresis. 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 Recently, he went to another health Institute and was diagnosed with pneumonia and leukemia. He is being referred here because of the diagnosis of leukemia. Review of his outside records show monoclonal B-cell population of unknown significance.  INTERVAL HISTORY: Please see below for problem oriented charting. He returns for followup on the blood issues above. Over the last 10 days, he developed swelling over the parotid region. 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 He is not able to close his right eye completely or open his mouth. He also noted increased difficulties with hearing on the right side. This swelling on the right face is not causing any pain. He denies recent new lymphadenopathy. Appetite is stable, denies recent weight loss. He denies new neurological deficits. No recent headaches.  REVIEW OF SYSTEMS:   Constitutional: Denies fevers, chills or abnormal weight loss Eyes: Denies blurriness of vision Ears, nose, mouth, throat, and face: Denies mucositis or sore throat Respiratory: Denies cough, dyspnea or wheezes Cardiovascular: Denies palpitation, chest discomfort or lower extremity swelling Gastrointestinal:  Denies nausea, heartburn or change in bowel habits Skin: Denies abnormal skin rashes Lymphatics: Denies new lymphadenopathy or easy bruising Neurological:Denies numbness, tingling or new  weaknesses Behavioral/Psych: Mood is stable, no new changes  All other systems were reviewed with the patient and are negative.  I have reviewed the past medical history, past surgical history, social history and family history with the patient and they are unchanged from previous note.  ALLERGIES:  has No Known Allergies.  MEDICATIONS:  Current Outpatient Prescriptions  Medication Sig Dispense Refill  . aspirin EC 81 MG tablet Take 81 mg by mouth daily.      Marland Kitchen atorvastatin (LIPITOR) 10 MG tablet Take 10 mg by mouth daily.      . clopidogrel (PLAVIX) 75 MG tablet TAKE ONE TABLET BY MOUTH ONCE DAILY  30 tablet  5  . levothyroxine (SYNTHROID, LEVOTHROID) 150 MCG tablet Take 1 tablet (150 mcg total) by mouth daily.  90 tablet  1  . metoprolol succinate (TOPROL-XL) 25 MG 24 hr tablet TAKE ONE TABLET BY MOUTH ONCE DAILY  30 tablet  6  . Multiple Vitamin (MULTIVITAMIN WITH MINERALS) TABS tablet Take 1 tablet by mouth daily.      Marland Kitchen omeprazole (PRILOSEC) 40 MG capsule Take 1 capsule (40 mg total) by mouth daily.  30 capsule  3  . sildenafil (VIAGRA) 100 MG tablet Take 100 mg by mouth daily as needed for erectile dysfunction. Not to be taken with nitroglycerin       No current facility-administered medications for this visit.    PHYSICAL EXAMINATION: ECOG PERFORMANCE STATUS: 1 - Symptomatic but completely ambulatory  Filed Vitals:   01/10/14 0828  BP: 131/67  Pulse: 66  Temp: 97.9 F (36.6 C)  Resp: 18   Filed Weights   01/10/14 0828  Weight: 186 lb 12.8 oz (84.732 kg)    GENERAL:alert, no distress and comfortable SKIN: He has numerous skin lesions on his head. Nothing suspicious for melanoma. EYES:  No significant difference in the right eye with no proptosis but the patient has difficulties with closing his right eye. OROPHARYNX:no exudate, no erythema and lips, buccal mucosa, and tongue normal . He has very mild difficulties opening his mouth NECK: supple, thyroid normal size,  non-tender, without nodularity. Noted swelling over the right parotid region. LYMPH:  no palpable lymphadenopathy in the cervical, axillary or inguinal LUNGS: clear to auscultation and percussion with normal breathing effort HEART: regular rate & rhythm and no murmurs and no lower extremity edema ABDOMEN:abdomen soft, non-tender and normal bowel sounds Musculoskeletal:no cyanosis of digits and no clubbing  NEURO: alert & oriented x 3 with fluent speech, with right-sided cranial nerve 7 palsy. LABORATORY DATA:  I have reviewed the data as listed    Component Value Date/Time   NA 138 01/10/2014 0912   K 4.4 01/10/2014 0912   CL 106 01/10/2014 0912   CO2 25 01/10/2014 0912   GLUCOSE 94 01/10/2014 0912   BUN 17 01/10/2014 0912   CREATININE 1.0 01/10/2014 0912   CREATININE 1.20 10/12/2010 1257   CALCIUM 8.5 01/10/2014 0912   PROT 5.6* 10/22/2013 1218   ALBUMIN 3.0* 10/22/2013 1218   AST 21 10/22/2013 1218   ALT 23 10/22/2013 1218   ALKPHOS 60 10/22/2013 1218   BILITOT 0.5 10/22/2013 1218   GFRNONAA 68.53 09/14/2009 1608   GFRAA  Value: >60        The eGFR has been calculated using the MDRD equation. This calculation has not been validated in all clinical situations. eGFR's persistently <60 mL/min signify possible Chronic Kidney Disease. 08/15/2009 0427    No results found for this basename: SPEP,  UPEP,   kappa and lambda light chains    Lab Results  Component Value Date   WBC 12.2* 10/22/2013   NEUTROABS 10.4* 10/22/2013   HGB 17.1* 10/22/2013   HCT 50.8 10/22/2013   MCV 94.2 10/22/2013   PLT 344.0 10/22/2013      Chemistry      Component Value Date/Time   NA 138 01/10/2014 0912   K 4.4 01/10/2014 0912   CL 106 01/10/2014 0912   CO2 25 01/10/2014 0912   BUN 17 01/10/2014 0912   CREATININE 1.0 01/10/2014 0912   CREATININE 1.20 10/12/2010 1257      Component Value Date/Time   CALCIUM 8.5 01/10/2014 0912   ALKPHOS 60 10/22/2013 1218   AST 21 10/22/2013 1218   ALT 23 10/22/2013 1218   BILITOT 0.5 10/22/2013 1218      I reviewed a stack of his outside records from his trip to [place]. ASSESSMENT & PLAN:  Parotid mass He will need urgent imaging and ENT evaluation. I called his prior ENT surgeon for an appointment today.  Cranial nerve VII palsy This could be due to infiltration of compression of the right cranial nerve. As mentioned above, I recommend urgent ENT evaluation.  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  Polycythemia, secondary This is due to his diaphragmatic paralysis. I told him not to be too concerned about this.    No orders of the defined types were placed in this encounter.   All questions were answered. The patient knows to call the clinic with any problems, questions or concerns. No barriers to learning was detected. I spent 30 minutes counseling the patient face to face. The total time spent in the appointment was 40 minutes and more than 50% was on counseling and review of test results     [facility],  Krystena Reitter, MD 01/10/2014 3:58 PM

## 2014-01-10 NOTE — Patient Instructions (Signed)
Left upper lobe pneumonia   -this is better on CXR 11/26/2013 and to me is further improved on CT 01/10/2014; formal report pending - Will call you with formal report to decide on followup timing  #Shortness of breath  - this is due to many factors and may never be fixed  - at this point, next workup is the heart and I will let Dr [name] know [facility] recommendations on this  #Possible CLL  - per DR Ivor Messier  #Facial swelling and left eye lid twitching  - see Dr Redmond Baseman Today   Followup Will call and let you know

## 2014-01-11 NOTE — Assessment & Plan Note (Addendum)
#  Shortness of breath  - this is due to many factors and may never be fixed  - at this point, next workup is the heart and I will let Dr [name] know [facility] recommendations on this   > 50% of this > 25 min visit spent in face to face counseling (15 min visit converted to 25 min)

## 2014-01-11 NOTE — Assessment & Plan Note (Signed)
Left upper lobe pneumonia   -this is better on CXR 11/26/2013 and to me is further improved on CT 01/10/2014; formal report pending - Will call you with formal report to decide on followup timing    Followup Will call and let you know

## 2014-01-14 ENCOUNTER — Telehealth: Payer: Self-pay | Admitting: Hematology and Oncology

## 2014-01-14 NOTE — Telephone Encounter (Signed)
Scheduled labs/ov per 08/07 POF, mailed pt new schedule.Marland Kitchen..KJ

## 2014-01-15 ENCOUNTER — Ambulatory Visit
Admission: RE | Admit: 2014-01-15 | Discharge: 2014-01-15 | Disposition: A | Discharge status: Home / Self Care | Payer: Medicare Other | Source: Ambulatory Visit | Attending: Otolaryngology | Admitting: Otolaryngology

## 2014-01-15 ENCOUNTER — Inpatient Hospital Stay: Admission: RE | Admit: 2014-01-15 | Payer: Medicare Other | Source: Ambulatory Visit

## 2014-01-15 DIAGNOSIS — R29810 Facial weakness: Secondary | ICD-10-CM

## 2014-01-15 MED ORDER — GADOBENATE DIMEGLUMINE 529 MG/ML IV SOLN
17.0000 mL | Freq: Once | INTRAVENOUS | Status: AC | PRN
  Administered 2014-01-15: 17 mL via INTRAVENOUS

## 2014-01-16 ENCOUNTER — Telehealth: Payer: Self-pay

## 2014-01-16 NOTE — Telephone Encounter (Signed)
Called to schedule AWV. Will call after Labor Day per pt request.

## 2014-01-19 ENCOUNTER — Other Ambulatory Visit: Payer: Medicare Other

## 2014-01-22 ENCOUNTER — Encounter: Payer: Self-pay | Admitting: *Deleted

## 2014-01-22 ENCOUNTER — Telehealth: Payer: Self-pay | Admitting: *Deleted

## 2014-01-22 DIAGNOSIS — R0602 Shortness of breath: Secondary | ICD-10-CM

## 2014-01-22 NOTE — Telephone Encounter (Signed)
After reviewing records from  [facility] Dr [name] recommended a lexiscan myoview and repeat sleep study.  Pt agrees to have [place] but prefers to discuss having repeat sleep study with Dr [name] when he sees him 02/24/14.  I have reviewed instructions for lexiscan myoview with patient and have forwarded information to [facility] to contact pt to schedule. Pt prefers to have this done between 02/11/14 and 02/24/14.

## 2014-02-05 ENCOUNTER — Encounter: Payer: Self-pay | Admitting: Internal Medicine

## 2014-02-05 ENCOUNTER — Telehealth: Payer: Self-pay | Admitting: Internal Medicine

## 2014-02-05 ENCOUNTER — Ambulatory Visit (INDEPENDENT_AMBULATORY_CARE_PROVIDER_SITE_OTHER): Payer: Medicare Other | Admitting: Internal Medicine

## 2014-02-05 VITALS — BP 102/70 | HR 80 | Ht 71.0 in | Wt 185.0 lb

## 2014-02-05 DIAGNOSIS — R0689 Other abnormalities of breathing: Secondary | ICD-10-CM

## 2014-02-05 DIAGNOSIS — J189 Pneumonia, unspecified organism: Secondary | ICD-10-CM

## 2014-02-05 DIAGNOSIS — J986 Disorders of diaphragm: Secondary | ICD-10-CM

## 2014-02-05 DIAGNOSIS — J181 Lobar pneumonia, unspecified organism: Principal | ICD-10-CM

## 2014-02-05 DIAGNOSIS — R0989 Other specified symptoms and signs involving the circulatory and respiratory systems: Secondary | ICD-10-CM

## 2014-02-05 DIAGNOSIS — R0609 Other forms of dyspnea: Secondary | ICD-10-CM

## 2014-02-05 DIAGNOSIS — R0600 Dyspnea, unspecified: Secondary | ICD-10-CM

## 2014-02-05 NOTE — Telephone Encounter (Signed)
Adrian West  Noticed you are doing treadmill or nuclear stress tes on [employer] but [facility] wants right heart eval too; anytime convenient for you is fine now that his pna has resolved  Of note, today first time he had exertional desats  And [place]les [place]  - ? ILD . Repeating High Rest CT in 1 month. IF present might need surgical lung bx. FYI. KNowing right heart status will become relevant if we go that route  Dr. Brand Males, M.D., [facility].C.P Pulmonary and Critical Care Medicine Staff Physician [facility] Pager: [phone], If no answer or between  15:00h - 7:00h: call [phone]  02/05/2014 2:46 PM

## 2014-02-05 NOTE — Telephone Encounter (Signed)
I do want to do a RHC on him but want to get him back in the office 1st to talk about it.

## 2014-02-05 NOTE — Patient Instructions (Addendum)
Left upper lobe pneumonia   -this is better on CXR 11/26/2013 and to me is further improved on CT 01/10/2014; - repeat CT chest first week Oct 2015  #Shortness of breath  - this is due to many factors and may never be fixed - repeat sniff test to reassess diaphragm and confirm [place] opinion that diaphragm is back working  - at this point, next workup is the heart is per Dr [name] -  Your oxygen drop with walking is NEW 02/05/2014 - do ONO test with [employer]  Do  High Resolution CT chest without contrast on ILD protocol first week Oct 2015    #Possible CLL  - per DR Ivor Messier  #Facial swelling and left eye lid twitching: BElls Palsy  - see Dr Redmond Baseman Today   #New loss of appetite - per DR [name], MD   Followup 1 months after CT chest

## 2014-02-05 NOTE — Progress Notes (Signed)
Subjective:    Patient ID: Adrian West, male    DOB: 1927-08-29, 80 y.o.   MRN: [id number]  HPI   hjad seen him 10/22/13 as followup from his visit to [place], [place]. 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 Instead she was concerned about possible a) aspiration and b) ILD c) CLL. Precise notes are unavailable at this time as well although as he was exiting the office I noticed he had the GI notes from [place]; I will review these  IT appears that 10/05/13 he arrived in [place], [place]  And by 10/07/13 he developed cough, congestion and felt sick. It appears that CT scan of chest wihtin a few days of arrival   (10/07/13 per appt records he ha with him) did not show any penumonia (date and image NA but on its way from Dr Jeannine Kitten via [employer]). Review of some records he has with him shows urgent care visit 10/09/13 with sudden onset cough x 1 day and hypoxemia. He refused admission, ER visit and CXR. He then says, he continued to be sick and on 10/15/13 he showed me a CXR report at [facility], [place] showing new Left Upper Mid Zone pneumonia (he did not show me this at last visit). He arrived back in [place] on 51/3/15 and was taken straight to local ER where pneumonia was diagnosed and giving levaquin (meanwhile Dr Jeannine Kitten had called in  on agumenting + doxy but he never took this). When I saw him 10/22/13 advised him to continue levaquin 5 more days. Same day PCP [name], MD did CXR that showed LUL mass like infitlrate. Then on 10/31/13 had CT chest whtat showed 4.7cm lingular.left upper lobe mass like density. Our radiolgoist concerned it was cancer. I then contacted Dr Jeannine Kitten who reviewed her CT and images of 10/31/13 I ahd emailed; the current mass like density is NEW. They called me in panic. Wife was upset and felt he needed more abx. I initially wanted him on augmentin but he  told my triage he did not have this anymore. So we called in levaquin again for 7 days.   Now 2-3 more days of levaquin remaining. Wife says cough, congestion are improved but still with decreased po, decreased appetite and also weight loss of 20# since arriving in [place] 10/05/13 one month ago . HE is complaining of persistent vertigo   OV 11/26/2013  Chief Complaint  Patient presents with  . Follow-up    CXR done today--Still having sob with exertion.  Denies wheezing, tightness or cough. Discuss results of testing done at [facility] in [place]   He is feeling better after his pneumonia Rx. In fact he says best he has felt in long time. I only got thevarious records of his [place] workup few to several days ago. There is a stack of them; not reviewed them yet. In any event, need to have this pneumonia resolve completely before implementing their recommendation. CXR 11/26/13 shos significant  Improvement but radiology is concerned about underlying mass; DR Jeannine Kitten over phone had not indicated presence of mass   No results found. Dg Chest 2 View  11/26/2013   CLINICAL DATA:  Followup pneumonia.  EXAM: CHEST  2 VIEW  COMPARISON:  10/22/2013, 09/17/2012 and chest CT 10/31/2013  FINDINGS: Lungs are adequately inflated with chronic bibasilar interstitial disease. There  is continued opacification of the left perihilar/lingular region slightly improved. No definite effusion. Cardiomediastinal silhouette and remainder of the exam is unchanged.  IMPRESSION: Improving left perihilar/lingular opacification suggesting improving infection, however cannot exclude underlying mass as suggested on recent chest CT. Recommend continued followup to resolution and followup CT if this abnormality persists.  Chronic bibasilar interstitial disease.   Electronically Signed   By: Marin Olp M.D.   On: 11/26/2013 10:02   OV 01/10/2014  Chief Complaint  Patient presents with  . Follow-up    Pt here after CT scan.  Pt states he has no change in breathing. Denies CP and cough. C/o doe.     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   Since June 2015 visits are followup of a May 2015  [facility] [place] for his dyspnea. Then had several series of recommendations for [place]  to continue in terms of dyspnea workup and mgmt. Mostly it seems that diaphragm function is improved and they are recommending re-testing his heart completely (right heart and left). However, this has been on hold due to him developing left upper lobe pneumonia during trip to [place], [place] in May 2015. Today he is here with followup of the CT scan for followup of pneumonia. The official report is pending but in my opinion the pneumonia is 90% resolved. He still has basal interstitial lung disease changes possibly. Also of note he did have a CT abdomen to evaluate his renal mass which is status post cryoablation the official results of this are also pending.  NEW ISSUE: Off note, in the last 10 days he has developed acute onset left eyelid twitching along with right parotid swelling without any pain. This was supported by his hematologist Steele Sizer is undergoing CLL evaluation] today and he has an appointment with ENT pending today   CT Ct Chest W Contrast  01/10/2014   IMPRESSION: Improved aeration of the left upper lobe with mild linear opacity, most in keeping with resolving pneumonia with mild residual infiltrate versus scarring.  Decreased size of mediastinal lymph nodes, with a subcarinal node measuring 1.5 cm short axis warranting continued attention on follow-up.  Dilated right and left pulmonary arteries may reflect pulmonary arterial hypertension. Bilateral lower lobe opacities/ scarring persists.  Post ablation changes lower pole left kidney. Higher attenuation component along the superior rim is indeterminate (likely post treatment however cannot  exclude a residual enhancing component as no unenhanced images were obtained). Recommend renal MRI.  Indeterminate sub cm hypodensity upper/ interpolar left kidney, unchanged.   Electronically Signed   By: Carlos Levering M.D.   On: 01/10/2014 16:27     OV 02/05/2014  Chief Complaint  Patient presents with  . Follow-up    Pt states he has bells palsy on right side. Pt c/o DOE, dry hacking cough. Pt states he had "severe chest pain for 5 minutes". Pt stated he did not take anything to alleviate CP but relaxed and the CP resolved.     Followup multifactorial dyspnea  - His workup recommendations from [facility] has been interrupted because of left upper lobe pneumonia sustained while he was in [place] in May 2015. It is now resolved. We tried to initiate workup for dyspnea again but then at last visit he developed some problems with his face and so his dyspnea workup had to be deferred again. He now tells me that he has been diagnosed with Bell's  palsy by ENT and he is on no specific treatment.  Continues to have profound dyspnea at rest and with exertion. Class 3 levels. He is still frustrated. He has appointment with cardiology now for a cardiac stress test. I am not sure if a right heart evaluation is pending but I have written to cardiology about this. Of note he wants to repeat  SNIFTT  testing show that his diaphragm function has returned as they've said in [place]. Of note, also for the first time he desaturated with exertion and he has new onset crackles in his lower lobes.  Past medical history: He has new onset of loss of appetite for the last 2 weeks and loss of taste. I've asked him to talk to his primary care physician about this. He also had a transient chest pain but his cardiology appointment pending     Review of Systems  Constitutional: Negative for fever and unexpected weight change.  HENT: Negative for congestion, dental problem, ear pain, nosebleeds, postnasal drip,  rhinorrhea, sinus pressure, sneezing, sore throat and trouble swallowing.   Eyes: Negative for redness and itching.  Respiratory: Positive for cough and shortness of breath. Negative for chest tightness and wheezing.   Cardiovascular: Positive for chest pain. Negative for palpitations and leg swelling.  Gastrointestinal: Negative for nausea and vomiting.  Genitourinary: Negative for dysuria.  Musculoskeletal: Negative for joint swelling.  Skin: Negative for rash.  Neurological: Negative for headaches.  Hematological: Does not bruise/bleed easily.  Psychiatric/Behavioral: Negative for dysphoric mood. The patient is not nervous/anxious.        Objective:   Physical Exam Filed Vitals:   02/05/14 1416  BP: 102/70  Pulse: 80  Height: 5\' 11"  (1.803 m)  Weight: 185 lb (83.915 kg)  SpO2: 93%     vitals reviewed. All below are unchanged Constitutional:  HENT:  Bilateral  PAROTID SWELLING, SOFT, NON TENDER, LEFT EYE LID TWITCHES - new last OV wit Rt eye lid no movement - dx with rt sided bells palsy per him Head: Normocephalic and atraumatic.  Right Ear: External ear normal.  Left Ear: External ear normal.  Mouth/Throat: Oropharynx is clear and moist. No oropharyngeal exudate.  Eyes: Conjunctivae and EOM are normal. Pupils are equal, round, and reactive to light. Right eye exhibits no discharge. Left eye exhibits no discharge. No scleral icterus.  Neck: Normal range of motion. Neck supple. No JVD present. No tracheal deviation present. No thyromegaly present.  Cardiovascular: Normal rate, regular rhythm and intact distal pulses.  Exam reveals no gallop and no friction rub.   No murmur heard. Pulmonary/Chest: Effort normal. No respiratory distress. He has no wheezes. NEW BASAL RALES +es. He exhibits no tenderness.  HE IS PUFFING  Abdominal: Soft. Bowel sounds are normal. He exhibits no distension and no mass. There is no tenderness. There is no rebound and no guarding.  Musculoskeletal:  Normal range of motion. He exhibits no edema and no tenderness.       Walks with hunch  Lymphadenopathy:    He has no cervical adenopathy.  Neurological: He is alert and oriented to person, place, and time. He has normal reflexes. No cranial nerve deficit. Coordination normal.       Mild LUE tremor  Skin: Skin is warm and dry. No rash noted. He is not diaphoretic. No erythema. No pallor.  Psychiatric: He has a normal mood and affect. His behavior is normal. Judgment and thought content normal.  Assessment & Plan:  Left upper lobe pneumonia   -this is better on CXR 11/26/2013 and to me is further improved on CT 01/10/2014; - repeat CT chest first week Oct 2015  #Shortness of breath  - this is due to many factors and may never be fixed - repeat sniff test to reassess diaphragm and confirm [place] opinion that diaphragm is back working  - at this point, next workup is the heart is per Dr [name] -  Your oxygen drop with walking is NEW 02/05/2014 and with crackles concerned about ILD  - do ONO test with [employer]   -- Do  High Resolution CT chest without contrast on ILD protocol first week Oct 2015    #Possible CLL  - per DR Ivor Messier  #Facial swelling and left eye lid twitching: BElls Palsy  - per Dr Redmond Baseman    #New loss of appetite - per DR [name], MD   Followup 1 months after CT chest

## 2014-02-06 ENCOUNTER — Other Ambulatory Visit (HOSPITAL_COMMUNITY): Payer: Self-pay | Admitting: Cardiology

## 2014-02-06 DIAGNOSIS — I6529 Occlusion and stenosis of unspecified carotid artery: Secondary | ICD-10-CM

## 2014-02-06 NOTE — Telephone Encounter (Signed)
Ok thanks a lot!!

## 2014-02-07 ENCOUNTER — Ambulatory Visit (HOSPITAL_COMMUNITY)
Admission: RE | Admit: 2014-02-07 | Discharge: 2014-02-07 | Disposition: A | Discharge status: Home / Self Care | Payer: Medicare Other | Source: Ambulatory Visit | Attending: Diagnostic Radiology | Admitting: Diagnostic Radiology

## 2014-02-07 DIAGNOSIS — J189 Pneumonia, unspecified organism: Secondary | ICD-10-CM | POA: Diagnosis present

## 2014-02-07 DIAGNOSIS — R0989 Other specified symptoms and signs involving the circulatory and respiratory systems: Secondary | ICD-10-CM

## 2014-02-07 DIAGNOSIS — J181 Lobar pneumonia, unspecified organism: Secondary | ICD-10-CM

## 2014-02-07 DIAGNOSIS — J986 Disorders of diaphragm: Secondary | ICD-10-CM

## 2014-02-07 DIAGNOSIS — R0609 Other forms of dyspnea: Secondary | ICD-10-CM

## 2014-02-11 ENCOUNTER — Ambulatory Visit (INDEPENDENT_AMBULATORY_CARE_PROVIDER_SITE_OTHER): Payer: Medicare Other | Admitting: Neurology

## 2014-02-11 ENCOUNTER — Encounter: Payer: Self-pay | Admitting: Neurology

## 2014-02-11 VITALS — BP 120/82 | HR 70 | Ht 71.0 in | Wt 182.0 lb

## 2014-02-11 DIAGNOSIS — I951 Orthostatic hypotension: Secondary | ICD-10-CM

## 2014-02-11 DIAGNOSIS — G609 Hereditary and idiopathic neuropathy, unspecified: Secondary | ICD-10-CM

## 2014-02-11 DIAGNOSIS — G510 Bell's palsy: Secondary | ICD-10-CM

## 2014-02-11 DIAGNOSIS — G629 Polyneuropathy, unspecified: Secondary | ICD-10-CM

## 2014-02-11 DIAGNOSIS — D472 Monoclonal gammopathy: Secondary | ICD-10-CM

## 2014-02-11 NOTE — Patient Instructions (Addendum)
1.  Wear eye patch when sleeping 2.  Use eye drops during the day and ointment at night time 3.  Literature provided on orthostatic intolerance   [facility]  Preventing Falls in the Home   Falls are common, often dreaded events in the lives of older people. Aside from the obvious injuries and even death that may result, falls can cause wide-ranging consequences including loss of independence, mental decline, decreased activity, and mobility. Younger people are also at risk of falling, especially those with chronic illnesses and fatigue.  Ways to reduce the risk for falling:  * Examine diet and medications. Warm foods and alcohol dilate blood vessels, which can lead to dizziness when standing. Sleep aids, antidepressants, and pain medications can also increase the likelihood of a fall.  * Get a vison exam. Poor vision, cataracts, and glaucoma increase the chances of falling.  * Check foot gear. Shoes should fit snugly and have a sturdy, nonskid sole and broad, low heel.  * Participate in a physician-approved exercise program to build and maintain muscle strength and improve balance and coordination.  * Increase vitamin D intake. Vitamin D improves muscle strength and increases the amount of calcium the body is able to absorb and deposit in bones.  How to prevent falls from common hazards:  * Floors - Remove all loose wires, cords, and throw rugs. Minimize clutter. Make sure rugs are anchored and smooth. Keep furniture in its usual place.  * Chairs - Use chairs with straight backs, armrests, and firm seats. Add firm cushions to existing pieces to add height.  * Bathroom - Install grab bars and non-skid tape in the tub or shower. Use a bathtub transfer bench or a shower chair with a back support. Use an elevated toilet seat and/or safety rails to assist standing from a low surface. Do not use towel racks or bathroom tissue holders to help you stand.  * Lighting - Make sure halls, stairways,  and entrances are well-lit. Install a night light in your bathroom or hallway. Make sure there is a light switch at the top and bottom of the staircase. Turn lights on if you get up in the middle of the night. Make sure lamps or light switches are within reach of the bed if you have to get up during the night.  * Kitchen - Install non-skid rubber mats near the sink and stove. Clean spills immediately. Store frequently used utensils, pots, and pans between waist and eye level. This helps prevent reaching and bending. Sit when getting things out of the lower cupboards.  * Living room / [employer] furniture with wide spaces in between, giving enough room to move around. Establish a route through the living room that gives you something to hold onto as you walk.  * Stairs - Make sure treads, rails, and rugs are secure. Install a rail on both sides of the stairs. If stairs are a threat, it might be helpful to arrange most of your activities on the lower level to reduce the number of times you must climb the stairs.  * Entrances and doorways - Install metal handles on the walls adjacent to the doorknobs of all doors to make it more secure as you travel through the doorway.  Tips for maintaining balance:  * Keep at least one hand free at all times Try using a backpack or fanny pack to hold things rather than carrying them in your hands. Never carry objects in both hands when  walking as this interferes with keeping your balance.  * Attempt to swing both arms from front to back while walking. This might require a conscious effort if Parkinson's disease has diminished your movement. It will, however, help you to maintain balance and posture, and reduce fatigue.  * Consciously lift your feet off the ground when walking. Shuffling and dragging of the feet is a common culprit in losing your balance.  * When trying to navigate turns, use a "U" technique of facing forward and making a wide turn, rather than pivoting  sharply.  * Try to stand with your feet shoulder-length apart. When your feet are close together for any length of time, you increase your risk of losing your balance and falling.  * Do one thing at a time. Do not try to walk and accomplish another task, such as reading or looking around. The decrease in your automatic reflexes complicates motor function, so the less distraction, the better.  * Do not wear rubber or gripping soled shoes, they might "catch" on the floor and cause tripping.  * Move slowly when changing positions. Use deliberate, concentrated movements and, if needed, use a grab bar or walking aid. Count fifteen (15) seconds after standing to begin walking.  * If balance is a continuous problem, you might want to consider a walking aid such as a cane, walking stick, or walker. Once you have mastered walking with help, you may be ready to try it again on your own.  This information is provided by [facility] and is not intended to replace the medical advice of your physician or other health care providers. Please consult your physician or other health care providers for advice regarding your specific medical condition.

## 2014-02-11 NOTE — Progress Notes (Signed)
[facility]  Follow-up Visit   Date: 02/11/2014    Adrian West MRN: [id number] DOB: Oct 13, 1927   Interim History: Adrian Lamer Towe Brooke Bonito. is a 80 y.o. 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    History of present illness: He was seeing Dr. Erling West for 30+ years for peripheral neuropathy, left hand tremor, and vertigo. He was last seen in March 2014 at which time he was told that there was no additional therapies that could be offered. Adrian West has a 10-year history of vertigo, described as intermittent lightheadedness. It is triggered by changes in position, such standing up or leaning over. Symptoms last about 2-minutes. It occurs about 6-10 times a day, depending on activity level. He has never fallen, but does hold on to objects. Rolling in bed or abrupt changes in head position does not worse these symptoms.   Additionally, he reports "blacking out" over the past 2-3 years. There is no associated loss of consciousness with these spells, but he feels tired and weak. Spells generally last about an hour and there is no prodromal symptoms. In June, he passed out and again did not lose consciousness but continued to feel weak. He was admitted and symptoms were attributed to dehydration.   Of note, he has had an episode of slurred speech in May 2012 which lasted several hours. This occurred in the setting of missing 2 days of his aspirin. He had a stroke workup which was negative and symptoms were attributed to possible TIA. MRI of the brain 10/20/2010 showed mild periventricular subcortical chronic small vessel disease. Vessel imaging was normal. He was briefly on Plavix but  developed a rash so was maintained on aspirin.   Regarding his shortness of breath, his workup has included EMG (5 06-2010), CPK, VGCC, anti-Hu, anti-Ri, and acetylcholine receptor antibodies which is within normal. Etiology: phrenic nerve injury.  Follow-up 05/21/2013:  Clinically unchanged. Evaluated by Dr. Beryle West whose work-up for consistent with MGUS and Dr. Caryl West performed tilt tablet which did not show evidence of othostasis.  He recommended using waist high stockings, but did not try this.   - Follow-up 08/20/2013:  He reports no worsening of tremors and lightheadedness.  His wife bought vertigo natural oils that he places behind his ear, which he reports to have helped during his balance exercises.  There has been no interval falls and he has no new neurological complaints.  He is scheduled to go to [facility].  - UPDATE 02/11/2014:  He had right Bell's palsy about 30-days and had MRI brain which shows right facial nerve enhacement without associated parotid mass.  Over the past 2 weeks, he had also noticed problems with taste.  Denies any abnormal twitching, tearing, drooling, or high pitched noise.  He was evaluated at [facility] in May for dyspnea who did not recommend surgical intervention for R phrenic injury.     Medications:  Current Outpatient Prescriptions on File Prior to Visit  Medication Sig Dispense Refill  . aspirin EC 81 MG tablet Take 81 mg by mouth daily.      Marland Kitchen atorvastatin (LIPITOR) 10 MG tablet Take 10 mg by mouth daily.      . clopidogrel (PLAVIX) 75 MG tablet TAKE ONE TABLET BY MOUTH  ONCE DAILY  30 tablet  5  . ibuprofen (ADVIL,MOTRIN) 800 MG tablet Take 800 mg by mouth 3 (three) times daily.      Marland Kitchen levothyroxine (SYNTHROID, LEVOTHROID) 150 MCG tablet Take 1 tablet (150 mcg total) by mouth daily.  90 tablet  1  . metoprolol succinate (TOPROL-XL) 25 MG 24 hr tablet TAKE ONE TABLET BY MOUTH ONCE DAILY  30 tablet  6  . Multiple Vitamin  (MULTIVITAMIN WITH MINERALS) TABS tablet Take 1 tablet by mouth daily.      Marland Kitchen omeprazole (PRILOSEC) 40 MG capsule Take 1 capsule (40 mg total) by mouth daily.  30 capsule  3  . sildenafil (VIAGRA) 100 MG tablet Take 100 mg by mouth daily as needed for erectile dysfunction. Not to be taken with nitroglycerin       No current facility-administered medications on file prior to visit.    Allergies: No Known Allergies   Review of Systems:  CONSTITUTIONAL: No fevers, chills, night sweats, or weight loss.   EYES: No visual changes or eye pain ENT: No hearing changes.  No history of nose bleeds.   RESPIRATORY: No cough, wheezing +shortness of breath.   CARDIOVASCULAR: Negative for chest pain, and palpitations.   GI: Negative for abdominal discomfort, blood in stools or black stools.  No recent change in bowel habits.   GU:  No history of incontinence.   MUSCLOSKELETAL: No history of joint pain or swelling.  No myalgias.   SKIN: Negative for lesions, rash, and itching.   HEMATOLOGY/ONCOLOGY: Negative for prolonged bleeding, bruising easily, and swollen nodes ENDOCRINE: Negative for cold or heat intolerance, polydipsia or goiter.   PSYCH:  No depression or anxiety symptoms.   NEURO: As Above.   Vital Signs:  BP 120/82  Pulse 70  Ht 5\' 11"  (1.803 m)  Wt 182 lb (82.555 kg)  BMI 25.40 kg/m2  Neurological Exam: MENTAL STATUS including orientation to time, place, person, recent and remote memory, attention span and concentration, language, and fund of knowledge is normal. Speech is not dysarthric, but his sentences are short due to dyspnea.  CRANIAL NERVES: Pupils round and reactive to light. Normal conjugate, extra-ocular eye movements in all directions of gaze.  No ptosis. Asymmetric face with right facial palsy (moderate), right ptosis, inability to maintain air in puffed cheeks and smile is asymmetric on right.  Tongue is midline.  Pathological facial reflexes are present (increased jaw  jerk, Snout, palmomental reflexes, and Myerson's signs)  MOTOR: Mild atrophy of intrinsic hand muscles bilaterally. Left > right postural hand tremor with low amplitude. Tremor is not present at rest. Coarse tremulous hand writing.  Right Upper Extremity:    Left Upper Extremity:    Deltoid  5/5   Deltoid  5/5   Biceps  5/5   Biceps  5/5   Triceps  5-/5   Triceps  5-/5   Wrist extensors  5/5   Wrist extensors  5/5   Wrist flexors  5/5   Wrist flexors  5/5   Finger extensors  5/5   Finger extensors  5/5   Finger flexors  5/5   Finger flexors  5/5   Dorsal interossei  4/5   Dorsal interossei  4/5   Abductor pollicis  5/5   Abductor pollicis  5/5   Tone (Ashworth scale)  0   Tone (Ashworth scale)  0    Right Lower Extremity:    Left Lower Extremity:    Hip flexors  5/5  Hip flexors  5/5   Hip extensors  5/5   Hip extensors  5/5   Knee flexors  5/5   Knee flexors  5/5   Knee extensors  5/5   Knee extensors  5/5   Dorsiflexors  5/5   Dorsiflexors  5/5   Plantarflexors  5-/5   Plantarflexors  5-/5   Toe extensors  4+/5   Toe extensors  4+/5   Toe flexors  5/5   Toe flexors  5/5   Tone (Ashworth scale)  0   Tone (Ashworth scale)  0    MSRs:  Right Left  brachioradialis  2+   brachioradialis  2+   biceps  2+   biceps  2+   triceps  1+   triceps  1+   patellar  2+   patellar  2+   ankle jerk  0   ankle jerk  0   Hoffman  no   Hoffman  no   plantar response  down   plantar response  down    SENSORY: Vibration reduced to 50% at knees and absent distal to ankles bilaterally, proprioception at great toe is slightly impaired.    COORDINATION/GAIT: Bradykinesia with reduced rate of finger tapping and toe tapping bilaterally (L >R) . Slightly stooped posture with small steps, gait appears narrow and stable.    Data: MRI brain wwo contrast 01/15/2014:  Findings consistent with RIGHT facial nerve Bell's palsy. See discussion above.  Stable chronic intracranial changes without acute stroke or  mass lesion.  MRI brain 10/22/2012:  No acute infarct. Mild small vessel disease type changes. Global atrophy without hydrocephalus. No intracranial mass lesion detected on this unenhanced exam. Polypoid complex opacification left maxillary sinus. Remodeling of the left alveolar ridge without change. CT imaging of the sinuses can be obtained for further delineation if clinically desired.   US carotids 10/11/2010: Mild intimal thickening bilaterally without significant plaque formation or stenosis.  Labs 03/06/2013:  ceruloplasmin 32, copper 108, MMA 0.23, HbA1c 5.5, TSH 2.00, B12 415 SPEP with IFE: Monoclonal IgG kappa protein is present Urine IFE:  No monoclonal free light chains (Bence Jones Protein) are detected.Urine IFE shows polyclonal increase in free Kappa and/or free Lambda light chains  Tilt tablet 05/06/2013:  Non-diagnostic test.  Given history though have suggested that he consider orthostatic maneuvers and or compression hose(waist high-20-30 mm) to see if empiric therapy assoc with improvement    IMPRESSION/PLAN: 1.  Idiopathic peripheral neuropathy with dysautonomia   Clinically stable  Neuropathy labs have been unrevealing, except that he has MGUS (monoclonal IgG kappa gammopathy)  He has tried a number of medications in the past, none of which have helped. I explained that given his symptoms are predominately large fiber sensory loss, medications are not helpful.  Should he develop positive symptoms such as paresthesia/pain, neuralgesic medications can be offered. 2.   Lightheadedness/dizziness due to orthostatic intolerance from underlying idiopathic PN  Extensive evaluation in the past has been negative  I had a lengthy discussion regarding lightheadedness as a symptom of dysautonomia and not "vertigo"  Recommended knee high stockings, but patient deferred  Fall precautions discussed and literature provided 3.  Right Bell's  - new  Moderate facial paralysis  Wear eye  patch when sleeping  Use eye drops during the day and ointment at night time 4.  Benign essential tremors, subtle parkinsonian features which I will follow clinically  Per patient, has been tried on a number of medications without any change  in symptoms  He is able to function without severe limitation, so will hold off on repeat trial of medication 5.  Chronic dyspnea secondary to R phrenic nerve injury 6.  Return to clinic in 25-month   The duration of this appointment visit was 30 minutes of face-to-face time with the patient.  Greater than 50% of this time was spent in counseling, explanation of diagnosis, planning of further management, and coordination of care.   Thank you for allowing me to participate in patient's care.  If I can answer any additional questions, I would be pleased to do so.    Sincerely,    Markale Birdsell K. Posey Pronto, DO

## 2014-02-12 ENCOUNTER — Ambulatory Visit (HOSPITAL_BASED_OUTPATIENT_CLINIC_OR_DEPARTMENT_OTHER): Payer: Medicare Other | Admitting: Radiology

## 2014-02-12 ENCOUNTER — Ambulatory Visit (HOSPITAL_COMMUNITY): Payer: Medicare Other | Attending: Cardiology | Admitting: *Deleted

## 2014-02-12 ENCOUNTER — Ambulatory Visit: Payer: Medicare Other | Admitting: Internal Medicine

## 2014-02-12 VITALS — BP 132/81 | HR 56 | Ht 71.0 in | Wt 180.0 lb

## 2014-02-12 DIAGNOSIS — R0609 Other forms of dyspnea: Secondary | ICD-10-CM | POA: Insufficient documentation

## 2014-02-12 DIAGNOSIS — M4800 Spinal stenosis, site unspecified: Secondary | ICD-10-CM | POA: Insufficient documentation

## 2014-02-12 DIAGNOSIS — R0989 Other specified symptoms and signs involving the circulatory and respiratory systems: Secondary | ICD-10-CM | POA: Insufficient documentation

## 2014-02-12 DIAGNOSIS — I2510 Atherosclerotic heart disease of native coronary artery without angina pectoris: Secondary | ICD-10-CM | POA: Diagnosis not present

## 2014-02-12 DIAGNOSIS — I4949 Other premature depolarization: Secondary | ICD-10-CM | POA: Insufficient documentation

## 2014-02-12 DIAGNOSIS — E785 Hyperlipidemia, unspecified: Secondary | ICD-10-CM | POA: Diagnosis not present

## 2014-02-12 DIAGNOSIS — Z8673 Personal history of transient ischemic attack (TIA), and cerebral infarction without residual deficits: Secondary | ICD-10-CM | POA: Diagnosis not present

## 2014-02-12 DIAGNOSIS — H539 Unspecified visual disturbance: Secondary | ICD-10-CM | POA: Insufficient documentation

## 2014-02-12 DIAGNOSIS — J986 Disorders of diaphragm: Secondary | ICD-10-CM | POA: Insufficient documentation

## 2014-02-12 DIAGNOSIS — R079 Chest pain, unspecified: Secondary | ICD-10-CM | POA: Insufficient documentation

## 2014-02-12 DIAGNOSIS — R0602 Shortness of breath: Secondary | ICD-10-CM

## 2014-02-12 DIAGNOSIS — I6529 Occlusion and stenosis of unspecified carotid artery: Secondary | ICD-10-CM | POA: Diagnosis present

## 2014-02-12 DIAGNOSIS — I10 Essential (primary) hypertension: Secondary | ICD-10-CM | POA: Diagnosis not present

## 2014-02-12 MED ORDER — TECHNETIUM TC 99M SESTAMIBI GENERIC - CARDIOLITE
30.0000 | Freq: Once | INTRAVENOUS | Status: AC | PRN
  Administered 2014-02-12: 30 via INTRAVENOUS

## 2014-02-12 MED ORDER — AMINOPHYLLINE 25 MG/ML IV SOLN
150.0000 mg | Freq: Once | INTRAVENOUS | Status: AC
  Administered 2014-02-12: 150 mg via INTRAVENOUS

## 2014-02-12 MED ORDER — REGADENOSON 0.4 MG/5ML IV SOLN
0.4000 mg | Freq: Once | INTRAVENOUS | Status: AC
  Administered 2014-02-12: 0.4 mg via INTRAVENOUS

## 2014-02-12 MED ORDER — TECHNETIUM TC 99M SESTAMIBI GENERIC - CARDIOLITE
10.0000 | Freq: Once | INTRAVENOUS | Status: AC | PRN
  Administered 2014-02-12: 10 via INTRAVENOUS

## 2014-02-12 NOTE — Progress Notes (Addendum)
[name] [address] [place], [place] [address] [phone]    Cardiology Nuclear Med Study  Adrian West is a 80 y.o. male     MRN : [id number]     DOB: [birth year]  Procedure Date: 02/12/2014  Nuclear Med Background Indication for Stress Test:  Evaluation for Ischemia History:  MPI 2012 (normal) EF 54% Cardiac Risk Factors: Carotid Disease, Hypertension and TIA  Symptoms:  Chest Pain (last date of chest discomfort was two weeks ago) and DOE   Nuclear Pre-Procedure Caffeine/Decaff Intake:  None> 12 hrs NPO After: 7:30pm   Lungs:  clear O2 Sat: 96% on room air. IV 0.9% NS with Angio Cath:  22g  IV Site: R Wrist x 1, tolerated well IV Started by:  Irven Baltimore, RN  Chest Size (in):  42 Cup Size: n/a  Height: 5\' 11"  (1.803 m)  Weight:  180 lb (81.647 kg)  BMI:  Body mass index is 25.12 kg/(m^2). Tech Comments:  Last dose of Toprol was one week ago per patient. Irven Baltimore, RN.    Nuclear Med Study 1 or 2 day study: 1 day  Stress Test Type:  Carlton Adam  Reading MD: N/A  Order Authorizing Provider:  Loralie Champagne, MD  Resting Radionuclide: Technetium 49m Sestamibi  Resting Radionuclide Dose: 11.0 mCi   Stress Radionuclide:  Technetium 40m Sestamibi  Stress Radionuclide Dose: 33.0 mCi           Stress Protocol Rest HR: 56 Stress HR: 86  Rest BP: 132/81 Stress BP: 110/71  Exercise Time (min): n/a METS: n/a           Dose of Adenosine (mg):  n/a Dose of Lexiscan: 0.4 mg  Dose of Atropine (mg): n/a Dose of Dobutamine: n/a mcg/kg/min (at max HR)  Stress Test Technologist: Glade Lloyd, BS-ES  Nuclear Technologist:  Annye Rusk, CNMT     Rest Procedure:  Myocardial perfusion imaging was performed at rest 45 minutes following the intravenous administration of Technetium 30m Sestamibi. Rest ECG: NSR-RBBB  Stress Procedure:  The patient received IV Lexiscan 0.4 mg over 15-seconds.  Technetium 19m Sestamibi injected at 30-seconds.   Quantitative spect images were obtained after a 45 minute delay.  During the infusion of Lexiscan the patient complained of cough, stomach ache, neck tightness and nausea.  The nausea persisted so 150 mg aminophylline was given and symptoms subsided.    Stress ECG: No significant change from baseline ECG and There are scattered PVCs.  QPS Raw Data Images:  Normal; no motion artifact; normal heart/lung ratio. Stress Images:  Normal homogeneous uptake in all areas of the myocardium. Rest Images:  Normal homogeneous uptake in all areas of the myocardium. Subtraction (SDS):  No evidence of ischemia. Transient Ischemic Dilatation (Normal <1.22):  1.00 Lung/Heart Ratio (Normal <0.45):  0.44  Quantitative Gated Spect Images QGS EDV:  113 ml QGS ESV:  49 ml  Impression Exercise Capacity:  Lexiscan with no exercise. BP Response:  Normal blood pressure response. Clinical Symptoms:  No significant symptoms noted. ECG Impression:  No significant ECG changes with Lexiscan. Comparison with Prior Nuclear Study: No previous nuclear study performed  Overall Impression:  Normal stress nuclear study.  LV Ejection Fraction: 57%.  LV Wall Motion:  NL LV Function; NL Wall Motion   Sanda Klein, MD, [facility] [phone] office [phone] pager  Normal stress test, please report to patient.   Loralie Champagne 02/13/2014 11:42 AM

## 2014-02-12 NOTE — Progress Notes (Signed)
Carotid duplex complete 

## 2014-02-13 ENCOUNTER — Encounter: Payer: Self-pay | Admitting: Internal Medicine

## 2014-02-13 ENCOUNTER — Encounter (HOSPITAL_COMMUNITY): Payer: Medicare Other

## 2014-02-13 ENCOUNTER — Ambulatory Visit (INDEPENDENT_AMBULATORY_CARE_PROVIDER_SITE_OTHER): Payer: Medicare Other | Admitting: Internal Medicine

## 2014-02-13 ENCOUNTER — Other Ambulatory Visit (INDEPENDENT_AMBULATORY_CARE_PROVIDER_SITE_OTHER): Payer: Medicare Other

## 2014-02-13 VITALS — BP 124/86 | HR 61 | Temp 97.1°F | Resp 16 | Ht 71.0 in | Wt 183.0 lb

## 2014-02-13 DIAGNOSIS — E039 Hypothyroidism, unspecified: Secondary | ICD-10-CM

## 2014-02-13 DIAGNOSIS — I10 Essential (primary) hypertension: Secondary | ICD-10-CM

## 2014-02-13 DIAGNOSIS — Z23 Encounter for immunization: Secondary | ICD-10-CM

## 2014-02-13 LAB — CBC WITH DIFFERENTIAL/PLATELET
BASOS PCT: 0.4 % (ref 0.0–3.0)
Basophils Absolute: 0 10*3/uL (ref 0.0–0.1)
EOS ABS: 0.1 10*3/uL (ref 0.0–0.7)
EOS PCT: 1.1 % (ref 0.0–5.0)
HCT: 49.2 % (ref 39.0–52.0)
HEMOGLOBIN: 16.9 g/dL (ref 13.0–17.0)
LYMPHS PCT: 11.3 % — AB (ref 12.0–46.0)
Lymphs Abs: 0.8 10*3/uL (ref 0.7–4.0)
MCHC: 34.4 g/dL (ref 30.0–36.0)
MCV: 92.5 fl (ref 78.0–100.0)
Monocytes Absolute: 0.4 10*3/uL (ref 0.1–1.0)
Monocytes Relative: 4.9 % (ref 3.0–12.0)
Neutro Abs: 6.1 10*3/uL (ref 1.4–7.7)
Neutrophils Relative %: 82.3 % — ABNORMAL HIGH (ref 43.0–77.0)
Platelets: 341 10*3/uL (ref 150.0–400.0)
RBC: 5.32 Mil/uL (ref 4.22–5.81)
RDW: 13.6 % (ref 11.5–15.5)
WBC: 7.4 10*3/uL (ref 4.0–10.5)

## 2014-02-13 LAB — BASIC METABOLIC PANEL
BUN: 17 mg/dL (ref 6–23)
CHLORIDE: 101 meq/L (ref 96–112)
CO2: 29 mEq/L (ref 19–32)
CREATININE: 1 mg/dL (ref 0.4–1.5)
Calcium: 9.2 mg/dL (ref 8.4–10.5)
GFR: 79.32 mL/min (ref >60.00)
GLUCOSE: 91 mg/dL (ref 70–99)
Potassium: 4.7 mEq/L (ref 3.5–5.1)
Sodium: 137 mEq/L (ref 135–145)

## 2014-02-13 LAB — TSH: TSH: 4.59 u[IU]/mL — ABNORMAL HIGH (ref 0.35–4.50)

## 2014-02-13 NOTE — Assessment & Plan Note (Signed)
His BP is well controlled 

## 2014-02-13 NOTE — Progress Notes (Signed)
Subjective:    Patient ID: Adrian West, male    DOB: Aug 23, 1927, 81 y.o.   MRN: [id number]  Thyroid Problem Presents for follow-up visit. Symptoms include fatigue and leg swelling. Patient reports no anxiety, cold intolerance, constipation, depressed mood, diaphoresis, diarrhea, dry skin, hair loss, heat intolerance, hoarse voice, nail problem, palpitations, tremors, visual change, weight gain or weight loss. The symptoms have been stable. Past treatments include levothyroxine. The treatment provided moderate relief. His past medical history is significant for dementia and neuropathy.      Review of Systems  Constitutional: Positive for fatigue. Negative for fever, chills, weight loss, weight gain, diaphoresis and appetite change.  HENT: Negative.  Negative for hoarse voice.   Eyes: Negative.   Respiratory: Negative.  Negative for cough, choking, chest tightness, shortness of breath and stridor.   Cardiovascular: Negative.  Negative for chest pain, palpitations and leg swelling.  Gastrointestinal: Negative.  Negative for nausea, vomiting, abdominal pain, diarrhea, constipation and blood in stool.  Endocrine: Negative.  Negative for cold intolerance and heat intolerance.  Genitourinary: Negative.   Musculoskeletal: Negative.   Skin: Negative.  Negative for rash.  Allergic/Immunologic: Negative.   Neurological: Positive for dizziness, facial asymmetry and light-headedness. Negative for tremors, seizures, syncope, speech difficulty, weakness, numbness and headaches.  Hematological: Negative.  Negative for adenopathy. Does not bruise/bleed easily.  Psychiatric/Behavioral: Negative.        Objective:   Physical Exam  Vitals reviewed. Constitutional: He is oriented to person, place, and time. He appears well-developed and well-nourished. No distress.  HENT:  Head: Normocephalic and atraumatic.  Mouth/Throat: Oropharynx is clear and moist. No oropharyngeal exudate.  Eyes:  Conjunctivae are normal. Right eye exhibits no discharge. Left eye exhibits no discharge. No scleral icterus.  Neck: Normal range of motion. Neck supple. No JVD present. No tracheal deviation present. No thyromegaly present.  Cardiovascular: Normal rate, regular rhythm, normal heart sounds and intact distal pulses.  Exam reveals no gallop and no friction rub.   No murmur heard. Pulmonary/Chest: Effort normal and breath sounds normal. No stridor. No respiratory distress. He has no wheezes. He has no rales. He exhibits no tenderness.  Abdominal: Soft. Bowel sounds are normal. He exhibits no distension and no mass. There is no tenderness. There is no rebound and no guarding.  Musculoskeletal: Normal range of motion. He exhibits edema (1+ pitting edema in BLE). He exhibits no tenderness.  Lymphadenopathy:    He has no cervical adenopathy.  Neurological: He is oriented to person, place, and time.  Skin: Skin is warm and dry. No rash noted. He is not diaphoretic. No erythema. No pallor.  Psychiatric: He has a normal mood and affect. His behavior is normal. Judgment and thought content normal.     Lab Results  Component Value Date   WBC 12.2* 10/22/2013   HGB 17.1* 10/22/2013   HCT 50.8 10/22/2013   PLT 344.0 10/22/2013   GLUCOSE 94 01/10/2014   CHOL 166 10/22/2013   TRIG 152.0* 10/22/2013   HDL 37.80* 10/22/2013   LDLDIRECT 145.2 09/20/2010   LDLCALC 98 10/22/2013   ALT 23 10/22/2013   AST 21 10/22/2013   NA 138 01/10/2014   K 4.4 01/10/2014   CL 106 01/10/2014   CREATININE 1.0 01/10/2014   BUN 17 01/10/2014   CO2 25 01/10/2014   TSH 6.28* 10/22/2013   PSA 0.69 02/14/2008   INR 0.99 08/13/2009   HGBA1C 5.5 03/06/2013       Assessment & Plan:

## 2014-02-13 NOTE — Patient Instructions (Signed)
Hypothyroidism The thyroid is a large gland located in the lower front of your neck. The thyroid gland helps control metabolism. Metabolism is how your body handles food. It controls metabolism with the hormone thyroxine. When this gland is underactive (hypothyroid), it produces too little hormone.  CAUSES These include:   Absence or destruction of thyroid tissue.  Goiter due to iodine deficiency.  Goiter due to medications.  Congenital defects (since birth).  Problems with the pituitary. This causes a lack of TSH (thyroid stimulating hormone). This hormone tells the thyroid to turn out more hormone. SYMPTOMS  Lethargy (feeling as though you have no energy)  Cold intolerance  Weight gain (in spite of normal food intake)  Dry skin  Coarse hair  Menstrual irregularity (if severe, may lead to infertility)  Slowing of thought processes Cardiac problems are also caused by insufficient amounts of thyroid hormone. Hypothyroidism in the newborn is cretinism, and is an extreme form. It is important that this form be treated adequately and immediately or it will lead rapidly to retarded physical and mental development. DIAGNOSIS  To prove hypothyroidism, your caregiver may do blood tests and ultrasound tests. Sometimes the signs are hidden. It may be necessary for your caregiver to watch this illness with blood tests either before or after diagnosis and treatment. TREATMENT  Low levels of thyroid hormone are increased by using synthetic thyroid hormone. This is a safe, effective treatment. It usually takes about four weeks to gain the full effects of the medication. After you have the full effect of the medication, it will generally take another four weeks for problems to leave. Your caregiver may start you on low doses. If you have had heart problems the dose may be gradually increased. It is generally not an emergency to get rapidly to normal. HOME CARE INSTRUCTIONS   Take your  medications as your caregiver suggests. Let your caregiver know of any medications you are taking or start taking. Your caregiver will help you with dosage schedules.  As your condition improves, your dosage needs may increase. It will be necessary to have continuing blood tests as suggested by your caregiver.  Report all suspected medication side effects to your caregiver. SEEK MEDICAL CARE IF: Seek medical care if you develop:  Sweating.  Tremulousness (tremors).  Anxiety.  Rapid weight loss.  Heat intolerance.  Emotional swings.  Diarrhea.  Weakness. SEEK IMMEDIATE MEDICAL CARE IF:  You develop chest pain, an irregular heart beat (palpitations), or a rapid heart beat. MAKE SURE YOU:   Understand these instructions.  Will watch your condition.  Will get help right away if you are not doing well or get worse. Document Released: 05/23/2005 Document Revised: 08/15/2011 Document Reviewed: 01/11/2008 Exit[employer] Patient Information 2015 [employer]. This information is not intended to replace advice given to you by your health care provider. Make sure you discuss any questions you have with your health care provider.  

## 2014-02-13 NOTE — Progress Notes (Signed)
Pre visit review using our clinic review tool, if applicable. No additional management support is needed unless otherwise documented below in the visit note. 

## 2014-02-13 NOTE — Assessment & Plan Note (Signed)
I will recheck his TSH and will adjust his dose if needed 

## 2014-02-13 NOTE — Progress Notes (Signed)
Quick Note:  Called and spoke to pt. Pt verbalized understanding and denied any further questions or concerns at this time. ______

## 2014-02-13 NOTE — Progress Notes (Signed)
LMTCB

## 2014-02-14 ENCOUNTER — Telehealth: Payer: Self-pay | Admitting: Internal Medicine

## 2014-02-14 NOTE — Telephone Encounter (Signed)
Called and spoke with [facility] from [place] and she tood the VO for the oxygen 2 liters at bedtime.  She will send the CMN over for MR to sign.  Nothing further is needed.

## 2014-02-14 NOTE — Telephone Encounter (Signed)
[email or web address] for [employer]

## 2014-02-14 NOTE — Telephone Encounter (Signed)
mandy called back and wanted to see if MR was going to start the pt on oxygen.  MR please advise. thanks

## 2014-02-14 NOTE — Telephone Encounter (Signed)
Yes plesae start on 2L [place] at night  Dr. Brand Males, M.D., [facility].C.P Pulmonary and Critical Care Medicine Staff Physician [facility] Pager: [phone], If no answer or between  15:00h - 7:00h: call [phone]  02/14/2014 3:58 PM

## 2014-02-17 ENCOUNTER — Ambulatory Visit (INDEPENDENT_AMBULATORY_CARE_PROVIDER_SITE_OTHER): Payer: Medicare Other | Admitting: Internal Medicine

## 2014-02-17 ENCOUNTER — Encounter: Payer: Self-pay | Admitting: Internal Medicine

## 2014-02-17 VITALS — BP 98/70 | HR 60 | Ht 68.0 in | Wt 183.2 lb

## 2014-02-17 DIAGNOSIS — K222 Esophageal obstruction: Secondary | ICD-10-CM

## 2014-02-17 DIAGNOSIS — K2100 Gastro-esophageal reflux disease with esophagitis, without bleeding: Secondary | ICD-10-CM

## 2014-02-17 NOTE — Progress Notes (Signed)
Pt requests call back later today.

## 2014-02-17 NOTE — Progress Notes (Signed)
LMTCB

## 2014-02-17 NOTE — Progress Notes (Signed)
HISTORY OF PRESENT ILLNESS:  Adrian West is a 80 y.o. male with multiple significant medical problems as listed below. He presents today for followup after having undergone an extensive evaluation at [facility] in [facility] regarding progressive shortness of breath in the setting of iatrogenic diaphragmatic paralysis. About 6 weeks ago, his request, I reviewed about 50 pages of documents. These evaluations and test results were from multiple subspecialty areas. With regards to GI studies. He underwent a barium esophagram with minimal silent aspiration of thin liquids, tiny esophageal diverticulum, and duodenal diverticulum. They were considering an impedance study. He is known to have GERD with documented esophagitis and peptic stricture. He has had dysphagia related to his peptic stricture requiring esophageal dilation on a number of occasions. He is also had prior routine screening colonoscopy in 2006 which was negative for polyps. His last upper endoscopy with esophageal dilation was January 2013. After reviewing outside records I recommended that he resume PPI in the form of omeprazole 40 mg daily. He has. He denies reflux symptoms or recurrent dysphagia. They did suggest impedance study as mentioned  REVIEW OF SYSTEMS:  All non-GI ROS negative except for Bell's palsy, shortness of breath, skin abnormalities, visual change, fatigue, hearing problems, ankle edema  Past Medical History  Diagnosis Date  . Thyroid disease   . Hypertension   . GERD (gastroesophageal reflux disease)     PMH of stricture , recurrent;[name]  . Benign neoplasm of colon   . Tremor, essential   . Diverticulosis   . TIA (transient ischemic attack) May 2012  . Skin cancer   . Neuropathy, peripheral   . Lung abnormality 11/25/2008    "nerve damage from heart surgery"  . Hyperlipidemia   . MGUS (monoclonal gammopathy of unknown significance) 04/11/2013  . Diaphragm paralysis   . Esophageal  stricture   . Hiatal hernia   . Bell's palsy     Past Surgical History  Procedure Laterality Date  . Appendectomy    . Cataract extraction      [name]  . Cholecystectomy  1998    [name]  . Inguinal hernia repair    . Lumbar laminectomy    . Nasal sinus surgery    . Tonsillectomy    . Esophageal dilation       X3;[name]  . 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 Laparoscopic ablation renal mass  08/14/2009  . Mohs surgery  2012     ear    Social History Adrian West Sick  reports that he has never smoked. He has never used smokeless tobacco. He reports that he does not drink alcohol or use illicit drugs.  family history includes Colon cancer (age of onset: 73) in his mother; Healthy in his sister; Heart attack (age of onset: 52) in his father; Heart attack (age of onset: 51) in his maternal grandmother; Uterine cancer in his paternal grandmother. There is no history of Stroke or Diabetes.  No Known Allergies     PHYSICAL EXAMINATION: Vital signs: BP 98/70  Pulse 60  Ht 5\' 8"  (1.727 m)  Wt 183 lb 4 oz (83.122 kg)  BMI 27.87 kg/m2 General: Elderly male with pursed lip breathing, no acute distress HEENT: Right eye ptosis; Sclerae are anicteric,  Abdomen: Not reexamined. Psychiatric: alert and oriented x3. Cooperative   ASSESSMENT:  #1. GERD complicated by peptic stricture. Currently asymptomatic on PPI #  2. Last colonoscopy 2006 with diverticulosis #3. Multiple significant medical problems   PLAN:  #1. Reflux precautions #2. Continue omeprazole 40 mg daily #3. No indication for impedance study #4. Consider esophageal dilation for recurrent severe dysphagia only. He is high-risk #5. Resume general medical care with other specialist. GI followup as needed  I spent 30 minutes counseling the patient with regards to his multiple medical questions with focus on GI issues

## 2014-02-17 NOTE — Patient Instructions (Signed)
Please follow up with Dr. Perry as needed 

## 2014-02-18 ENCOUNTER — Ambulatory Visit
Admission: RE | Admit: 2014-02-18 | Discharge: 2014-02-18 | Disposition: A | Discharge status: Home / Self Care | Payer: Medicare Other | Source: Ambulatory Visit | Attending: Interventional Radiology | Admitting: Interventional Radiology

## 2014-02-18 DIAGNOSIS — N2889 Other specified disorders of kidney and ureter: Secondary | ICD-10-CM

## 2014-02-18 NOTE — Progress Notes (Signed)
Chief Complaint:   Status post percutaneous cryoablation of a left renal tumor on 08/14/2009.  History of Present Illness: Adrian West is a 80 y.o. male status post left renal cryoablation. He has been doing well with no symptoms referable to the left kidney. He has had further recent workup pulmonary problems including paralysis of the right hemidiaphragm and was evaluated in [place].  Past Medical History  Diagnosis Date  . Thyroid disease   . Hypertension   . GERD (gastroesophageal reflux disease)     PMH of stricture , recurrent;[name]  . Benign neoplasm of colon   . Tremor, essential   . Diverticulosis   . TIA (transient ischemic attack) May 2012  . Skin cancer   . Neuropathy, peripheral   . Lung abnormality 11/25/2008    "nerve damage from heart surgery"  . Hyperlipidemia   . MGUS (monoclonal gammopathy of unknown significance) 04/11/2013  . Diaphragm paralysis   . Esophageal stricture   . Hiatal hernia   . Bell's palsy     Past Surgical History  Procedure Laterality Date  . Appendectomy    . Cataract extraction      [name]  . Cholecystectomy  1998    [name]  . Inguinal hernia repair    . Lumbar laminectomy    . Nasal sinus surgery    . Tonsillectomy    . Esophageal dilation       X3;[name]  . 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 Laparoscopic ablation renal mass  08/14/2009  . Mohs surgery  2012     ear    Allergies: Review of patient's allergies indicates no known allergies.  Medications: Prior to Admission medications   Medication Sig Start Date End Date Taking? Authorizing Provider  aspirin EC 81 MG tablet Take 81 mg by mouth daily.    Historical Provider, MD  atorvastatin (LIPITOR) 10 MG tablet Take 10 mg by mouth daily.    Historical Provider, MD  clopidogrel (PLAVIX) 75 MG tablet TAKE ONE TABLET BY MOUTH ONCE DAILY 06/10/13   [name], MD  levothyroxine  (SYNTHROID, LEVOTHROID) 150 MCG tablet Take 1 tablet (150 mcg total) by mouth daily. 10/22/13   Janith Lima, MD  metoprolol succinate (TOPROL-XL) 25 MG 24 hr tablet TAKE ONE TABLET BY MOUTH ONCE DAILY    [name], MD  Multiple Vitamin (MULTIVITAMIN WITH MINERALS) TABS tablet Take 1 tablet by mouth daily.    Historical Provider, MD  omeprazole (PRILOSEC) 40 MG capsule Take 1 capsule (40 mg total) by mouth daily. 01/07/14   Irene Shipper, MD    Family History  Problem Relation Age of Onset  . Heart attack Father 38  . Colon cancer Mother 73  . Heart attack Maternal Grandmother 60  . Uterine cancer Paternal Grandmother   . Stroke Neg Hx   . Diabetes Neg Hx   . Healthy Sister     History   Social History  . Marital Status: Married    Spouse Name: N/A    Number of Children: N/A  . Years of Education: N/A   Social History Main Topics  . Smoking status: Never Smoker   . Smokeless tobacco: Never Used  . Alcohol Use: No     Comment: Drinks wine 3 per night  . Drug Use: No  . Sexual Activity: Not Currently   Other Topics Concern  .  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   Married x 53 years.  They have three daughters.     Review of Systems: A 12 point ROS discussed and pertinent positives are indicated in the HPI above.  All other systems are negative.  Review of Systems   Constitutional: Negative for fever and chills.  Respiratory: Positive for shortness of breath.   Gastrointestinal: Negative for nausea, vomiting and abdominal pain.  Genitourinary: Negative for dysuria, hematuria, flank pain and difficulty urinating.    Vital Signs: BP 143/60  Pulse 63  Temp(Src) 97.8 F (36.6 C)  SpO2 96%  Physical Exam  Constitutional: No distress.  Abdominal: Soft. He exhibits no distension. There is no tenderness.    Imaging: Dg Sniff Test  02/07/2014   FINDINGS: Fluoroscopy the diaphragm was  performed during quiet breathing, deep breathing, and sniff.  Right hemidiaphragm is elevated as noted on prior chest x-ray.  With quiet breathing, there is paresis of the right hemidiaphragm. The right hemidiaphragm does move appropriately although has very little excursion.  During deep breathing, the right hemidiaphragm shows appropriate movement although decreased excursion due to paresis.  During sniffing, the right hemidiaphragm shows appropriate movement. No paradoxical movement.  IMPRESSION: Elevated right hemidiaphragm with paresis of the right hemidiaphragm. The right hemidiaphragm does move appropriately although has less excursion then normal. Findings consistent with paresis of the right hemidiaphragm.   Electronically Signed   By: Franchot Gallo M.D.   On: 02/07/2014 11:11   CLINICAL DATA: Left upper lobe pneumonia, left renal mass status  post renal ablation.  EXAM:  CT CHEST WITH CONTRAST  CT ABDOMEN AND PELVIS WITH CONTRAST  TECHNIQUE:  Multidetector CT imaging of the chest was performed during  intravenous contrast administration. Multidetector CT imaging of the  abdomen and pelvis was performed following bolus administration of  intravenous contrast in the corticomedullary and nephrographic  phases.  CONTRAST: 130m OMNIPAQUE IOHEXOL 300 MG/ML SOLN  COMPARISON: 09/18/2012 abdominal pelvic CT, 10/31/2013 chest CT  FINDINGS:  CT CHEST FINDINGS  Scattered atherosclerotic disease of the aorta and branch vessels  without aneurysmal dilatation.  Dilatation of the right pulmonary artery up to 3.3 cm and left  pulmonary artery up to 3.8 cm. The main pulmonary artery measures  2.3 cm.  Normal heart size. Mitral valve replacement. Coronary artery  calcifications. Trace pericardial fluid.  No pleural effusion. Sub carinal lymph node has decreased in size,  measuring 1.5 cm short axis on series 5, image 33 as compared to 1.7  cm short axis on the prior.  Central airways are patent.  No pneumothorax.  Interval improved aeration of the left upper lobe paramediastinal  consolidation, with mild residual linear opacity.  Mild centrilobular emphysema. Bibasilar airspace and peripheral  reticular opacities are similar to prior, favored to reflect  scarring. Calcific density adjacent to the right heart margin on  image 44 is unchanged.  Bilateral shoulder degenerative changes multilevel degenerative  changes of the spine.  CT ABDOMEN AND PELVIS FINDINGS  Segment 4 hypodensity on image 55, nonspecific, favored to reflect  scarring or focal fat. Lobular liver contour. Cholecystectomy. No  biliary ductal dilatation.  Fatty atrophy of the pancreas. No appreciable abnormality of the  spleen or right adrenal gland. Mild left adrenal nodularity on image  64 series 5 is similar to prior.  Similar appearance to the kidneys. There is an upper/ interpolar  hypodensity on the left  measuring sub cm and an exophytic  hypodensity with calcified rim arising from the lower pole left  kidney. Soft tissue attenuation component along the superior rim  (coronal image 76-79/146. No hydroureteronephrosis.  Colonic diverticulosis. No CT evidence for colitis or  diverticulitis. Appendix not confidently identified. No right lower  quadrant inflammation. Small bowel loops are of normal course and  caliber. Tiny fat containing umbilical hernia.  Thin walled bladder. Prostate gland measures 4.9 cm transverse  diameter. Small fat containing left inguinal hernia. New right SI  joint ankylosis.  Advanced multilevel degenerative changes. Sequelae of prior right L3  transverse process fracture. L2-L4 partial fusion.  IMPRESSION:  Improved aeration of the left upper lobe with mild linear opacity,  most in keeping with resolving pneumonia with mild residual  infiltrate versus scarring.  Decreased size of mediastinal lymph nodes, with a subcarinal node  measuring 1.5 cm short axis warranting continued  attention on  follow-up.  Dilated right and left pulmonary arteries may reflect pulmonary  arterial hypertension. Bilateral lower lobe opacities/ scarring  persists.  Post ablation changes lower pole left kidney. Higher attenuation  component along the superior rim is indeterminate (likely post  treatment however cannot exclude a residual enhancing component as  no unenhanced images were obtained). Recommend renal MRI.  Indeterminate sub cm hypodensity upper/ interpolar left kidney,  unchanged.  Electronically Signed  By: Carlos Levering M.D.  On: 01/10/2014 16:27   Labs: Lab Results  Component Value Date   WBC 7.4 02/13/2014   HCT 49.2 02/13/2014   MCV 92.5 02/13/2014   PLT 341.0 02/13/2014   NA 137 02/13/2014   K 4.7 02/13/2014   CL 101 02/13/2014   CO2 29 02/13/2014   GLUCOSE 91 02/13/2014   BUN 17 02/13/2014   CREATININE 1.0 02/13/2014   CALCIUM 9.2 02/13/2014   PROT 5.6* 10/22/2013   ALBUMIN 3.0* 10/22/2013   AST 21 10/22/2013   ALT 23 10/22/2013   ALKPHOS 60 10/22/2013   BILITOT 0.5 10/22/2013   GFRNONAA 68.53 09/14/2009   GFRAA  Value: >60        The eGFR has been calculated using the MDRD equation. This calculation has not been validated in all clinical situations. eGFR's persistently <60 mL/min signify possible Chronic Kidney Disease. 08/15/2009   INR 0.99 08/13/2009    Assessment and Plan:  Mr. Brooks is doing well 4 years post ablation of a left renal mass. Biopsy at the time of ablation was inconclusive for malignancy. Current CT shows no evidence of abnormal enhancement or tumor recurrence. I told the patient that at this point we could stop routine imaging of the kidney four years post ablation. I personally reviewed the CT exam myself and with the patient. I do not believe that we need to pursue an MRI to further evaluate the lesion. Compared to prior imaging, the post ablation appearance is clearly stable with evidence of partial lesion calcification after treatment and no  evidence of enhancement after administration of contrast. The patient is agreeable. He will contact me should he have any new symptoms referable to the kidney or abdomen.  Venetia Night. Kathlene Cote, M.D. Pager:  [phone]     I spent a total of 20 minutes face to face in clinical consultation, greater than 50% of which was counseling/coordinating care for followup post renal ablation.

## 2014-02-18 NOTE — Progress Notes (Signed)
LMTCB

## 2014-02-20 ENCOUNTER — Telehealth: Payer: Self-pay | Admitting: Internal Medicine

## 2014-02-20 DIAGNOSIS — J181 Lobar pneumonia, unspecified organism: Principal | ICD-10-CM

## 2014-02-20 DIAGNOSIS — J189 Pneumonia, unspecified organism: Secondary | ICD-10-CM | POA: Insufficient documentation

## 2014-02-20 NOTE — Telephone Encounter (Signed)
Called made [facility] aware Called pt LMTCB x1

## 2014-02-20 NOTE — Telephone Encounter (Signed)
Is fine, probably psychological. LEt him know that after his feedback I support his waiting on it. Will address at fu  Dr. Brand Males, M.D., [facility].C.P Pulmonary and Critical Care Medicine Staff Physician [facility] Pager: [phone], If no answer or between  15:00h - 7:00h: call [phone]  02/20/2014 3:21 PM

## 2014-02-20 NOTE — Telephone Encounter (Signed)
Called and spoke with the pt  He states that he does not want to noct o2 "I don't really need it" I advised that we ordered it for him b/c ONO shows that his sats drop too low, and it is important for him to start on o2  He verbalized understanding, and states that he will have to think about this some more  I advised will forward the msg to MR to let him know  I spoke with [facility] and she is aware and will put the order on hold  Please advise, thanks!

## 2014-02-21 NOTE — Telephone Encounter (Signed)
LMOM TCB x2

## 2014-02-21 NOTE — Telephone Encounter (Signed)
Spoke with the pt and notified of recs per MR Pt verbalized understanding and nothing further needed

## 2014-02-24 ENCOUNTER — Encounter: Payer: Self-pay | Admitting: *Deleted

## 2014-02-24 ENCOUNTER — Ambulatory Visit (INDEPENDENT_AMBULATORY_CARE_PROVIDER_SITE_OTHER): Payer: Medicare Other | Admitting: Cardiology

## 2014-02-24 ENCOUNTER — Encounter: Payer: Self-pay | Admitting: Cardiology

## 2014-02-24 VITALS — BP 126/82 | HR 64 | Ht 68.0 in | Wt 184.0 lb

## 2014-02-24 DIAGNOSIS — I080 Rheumatic disorders of both mitral and aortic valves: Secondary | ICD-10-CM

## 2014-02-24 DIAGNOSIS — J986 Disorders of diaphragm: Secondary | ICD-10-CM

## 2014-02-24 DIAGNOSIS — G459 Transient cerebral ischemic attack, unspecified: Secondary | ICD-10-CM

## 2014-02-24 DIAGNOSIS — R0989 Other specified symptoms and signs involving the circulatory and respiratory systems: Secondary | ICD-10-CM

## 2014-02-24 DIAGNOSIS — R0602 Shortness of breath: Secondary | ICD-10-CM

## 2014-02-24 DIAGNOSIS — R0609 Other forms of dyspnea: Secondary | ICD-10-CM

## 2014-02-24 MED ORDER — ZOLPIDEM TARTRATE 5 MG PO TABS: ORAL_TABLET | ORAL | Status: DC

## 2014-02-24 NOTE — Progress Notes (Signed)
appt 02/24/14 Dr [name]

## 2014-02-24 NOTE — Patient Instructions (Addendum)
You are scheduled for a right heart catheterization on Monday September 28,2015. See instruction sheet given to you today.  Your physician has recommended that you have a sleep study. This test records several body functions during sleep, including: brain activity, eye movement, oxygen and carbon dioxide blood levels, heart rate and rhythm, breathing rate and rhythm, the flow of air through your mouth and nose, snoring, body muscle movements, and chest and belly movement. TAKE AMBIEN 5MG  WHEN YOU ARRIVE AT [facility]  Your physician recommends that you schedule a follow-up appointment in: 2 months with Dr [name].
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
## 2014-02-28 ENCOUNTER — Telehealth: Payer: Self-pay | Admitting: Cardiology

## 2014-02-28 NOTE — Telephone Encounter (Signed)
Spoke with patient.

## 2014-02-28 NOTE — Telephone Encounter (Signed)
Is he responsible for $496 or $4,960?

## 2014-02-28 NOTE — Telephone Encounter (Signed)
Follow up:    Per pt he needs a call back about his Cath on 9/28.   Please give him a call back.

## 2014-02-28 NOTE — Telephone Encounter (Signed)
No other way to get information we need without cath.

## 2014-02-28 NOTE — Telephone Encounter (Signed)
Pt and billing said $496.00.

## 2014-02-28 NOTE — Telephone Encounter (Signed)
Pt received call earlier today from [facility] pre procedure billing about charges for [place] on 03/03/14. Pt states he was told the estimated cost  for procedure was $10,000, pt was responsible for 20%, $496.00.  I spoke with Daiva Eves at [facility] billing, [phone], and he confirmed this was the information given to pt.  Pt asked that I forward this information to Dr [name].

## 2014-03-03 ENCOUNTER — Encounter (HOSPITAL_COMMUNITY)
Admission: RE | Disposition: A | Discharge status: Home / Self Care | Payer: Self-pay | Source: Ambulatory Visit | Attending: Cardiology

## 2014-03-03 ENCOUNTER — Ambulatory Visit (HOSPITAL_COMMUNITY)
Admission: RE | Admit: 2014-03-03 | Discharge: 2014-03-03 | Disposition: A | Discharge status: Home / Self Care | Payer: Medicare Other | Source: Ambulatory Visit | Attending: Cardiology | Admitting: Cardiology

## 2014-03-03 DIAGNOSIS — Z8673 Personal history of transient ischemic attack (TIA), and cerebral infarction without residual deficits: Secondary | ICD-10-CM | POA: Diagnosis not present

## 2014-03-03 DIAGNOSIS — R0609 Other forms of dyspnea: Secondary | ICD-10-CM | POA: Insufficient documentation

## 2014-03-03 DIAGNOSIS — I4949 Other premature depolarization: Secondary | ICD-10-CM | POA: Insufficient documentation

## 2014-03-03 DIAGNOSIS — Z7902 Long term (current) use of antithrombotics/antiplatelets: Secondary | ICD-10-CM | POA: Diagnosis not present

## 2014-03-03 DIAGNOSIS — I10 Essential (primary) hypertension: Secondary | ICD-10-CM | POA: Diagnosis not present

## 2014-03-03 DIAGNOSIS — R0989 Other specified symptoms and signs involving the circulatory and respiratory systems: Secondary | ICD-10-CM | POA: Diagnosis not present

## 2014-03-03 DIAGNOSIS — E039 Hypothyroidism, unspecified: Secondary | ICD-10-CM | POA: Diagnosis not present

## 2014-03-03 DIAGNOSIS — R0602 Shortness of breath: Secondary | ICD-10-CM

## 2014-03-03 DIAGNOSIS — Z7982 Long term (current) use of aspirin: Secondary | ICD-10-CM | POA: Diagnosis not present

## 2014-03-03 DIAGNOSIS — E785 Hyperlipidemia, unspecified: Secondary | ICD-10-CM | POA: Diagnosis not present

## 2014-03-03 HISTORY — PX: RIGHT HEART CATHETERIZATION: SHX5447

## 2014-03-03 LAB — POCT I-STAT 3, VENOUS BLOOD GAS (G3P V)
Acid-Base Excess: 1 mmol/L (ref 0.0–2.0)
Bicarbonate: 26.1 mEq/L — ABNORMAL HIGH (ref 20.0–24.0)
O2 Saturation: 66 %
PH VEN: 7.38 — AB (ref 7.250–7.300)
PO2 VEN: 35 mmHg (ref 30.0–45.0)
TCO2: 27 mmol/L (ref 0–100)
pCO2, Ven: 44.2 mmHg — ABNORMAL LOW (ref 45.0–50.0)

## 2014-03-03 LAB — BASIC METABOLIC PANEL
Anion gap: 9 (ref 5–15)
BUN: 14 mg/dL (ref 6–23)
CO2: 27 mEq/L (ref 19–32)
CREATININE: 0.9 mg/dL (ref 0.50–1.35)
Calcium: 8.7 mg/dL (ref 8.4–10.5)
Chloride: 103 mEq/L (ref 96–112)
GFR, EST AFRICAN AMERICAN: 88 mL/min — AB (ref >90)
GFR, EST NON AFRICAN AMERICAN: 76 mL/min — AB (ref >90)
Glucose, Bld: 98 mg/dL (ref 70–99)
Potassium: 4.5 mEq/L (ref 3.7–5.3)
Sodium: 139 mEq/L (ref 137–147)

## 2014-03-03 LAB — POCT I-STAT 3, ART BLOOD GAS (G3+)
BICARBONATE: 24.4 meq/L — AB (ref 20.0–24.0)
O2 SAT: 94 %
PCO2 ART: 39.3 mmHg (ref 35.0–45.0)
TCO2: 26 mmol/L (ref 0–100)
pH, Arterial: 7.4 (ref 7.350–7.450)
pO2, Arterial: 72 mmHg — ABNORMAL LOW (ref 80.0–100.0)

## 2014-03-03 LAB — CBC
HCT: 44.6 % (ref 39.0–52.0)
Hemoglobin: 15.4 g/dL (ref 13.0–17.0)
MCH: 31.8 pg (ref 26.0–34.0)
MCHC: 34.5 g/dL (ref 30.0–36.0)
MCV: 92.1 fL (ref 78.0–100.0)
PLATELETS: 195 10*3/uL (ref 150–400)
RBC: 4.84 MIL/uL (ref 4.22–5.81)
RDW: 13.6 % (ref 11.5–15.5)
WBC: 5.6 10*3/uL (ref 4.0–10.5)

## 2014-03-03 LAB — PROTIME-INR
INR: 0.96 (ref 0.00–1.49)
PROTHROMBIN TIME: 12.8 s (ref 11.6–15.2)

## 2014-03-03 SURGERY — RIGHT HEART CATH
Anesthesia: LOCAL

## 2014-03-03 MED ORDER — LIDOCAINE HCL (PF) 1 % IJ SOLN
INTRAMUSCULAR | Status: AC
Start: 2014-03-03 — End: 2014-03-03
  Filled 2014-03-03: qty 30

## 2014-03-03 MED ORDER — SODIUM CHLORIDE 0.9 % IV SOLN
250.0000 mL | INTRAVENOUS | Status: DC | PRN
Start: 2014-03-03 — End: 2014-03-03

## 2014-03-03 MED ORDER — SODIUM CHLORIDE 0.9 % IV SOLN
INTRAVENOUS | Status: DC
  Administered 2014-03-03: 11:00:00 via INTRAVENOUS

## 2014-03-03 MED ORDER — SODIUM CHLORIDE 0.9 % IJ SOLN: 3.0000 mL | Freq: Two times a day (BID) | INTRAMUSCULAR | Status: DC

## 2014-03-03 MED ORDER — ASPIRIN 81 MG PO CHEW
CHEWABLE_TABLET | ORAL | Status: AC
  Filled 2014-03-03: qty 1

## 2014-03-03 MED ORDER — ONDANSETRON HCL 4 MG/2ML IJ SOLN
4.0000 mg | Freq: Four times a day (QID) | INTRAMUSCULAR | Status: DC | PRN
Start: 2014-03-03 — End: 2014-03-03

## 2014-03-03 MED ORDER — ACETAMINOPHEN 325 MG PO TABS: 650.0000 mg | ORAL_TABLET | ORAL | Status: DC | PRN

## 2014-03-03 MED ORDER — HEPARIN (PORCINE) IN NACL 2-0.9 UNIT/ML-% IJ SOLN
INTRAMUSCULAR | Status: AC
  Filled 2014-03-03: qty 1000

## 2014-03-03 MED ORDER — SODIUM CHLORIDE 0.9 % IJ SOLN
3.0000 mL | INTRAMUSCULAR | Status: DC | PRN
Start: 2014-03-03 — End: 2014-03-03

## 2014-03-03 MED ORDER — ASPIRIN 81 MG PO CHEW
81.0000 mg | CHEWABLE_TABLET | ORAL | Status: AC
  Administered 2014-03-03: 81 mg via ORAL

## 2014-03-03 MED ORDER — MIDAZOLAM HCL 2 MG/2ML IJ SOLN
INTRAMUSCULAR | Status: AC
Start: 2014-03-03 — End: 2014-03-03
  Filled 2014-03-03: qty 2

## 2014-03-03 MED ORDER — SODIUM CHLORIDE 0.9 % IV SOLN: 250.0000 mL | INTRAVENOUS | Status: DC | PRN

## 2014-03-03 MED ORDER — SODIUM CHLORIDE 0.9 % IJ SOLN: 3.0000 mL | INTRAMUSCULAR | Status: DC | PRN

## 2014-03-03 MED ORDER — FENTANYL CITRATE 0.05 MG/ML IJ SOLN
INTRAMUSCULAR | Status: AC
  Filled 2014-03-03: qty 2

## 2014-03-03 NOTE — CV Procedure (Signed)
    Cardiac Catheterization Procedure Note  Name: [facility] MRN: [id number] DOB: 1927-12-01  Procedure: Right Heart Cath  Indication: Dyspnea, assess for elevated filling pressures.    Procedural Details: The right groin was prepped, draped, and anesthetized with 1% lidocaine. Using the modified Seldinger technique a 7 French sheath was placed in the right femoral vein. A Swan-Ganz catheter was used for the right heart catheterization. Standard protocol was followed for recording of right heart pressures and sampling of oxygen saturations. Fick cardiac output was calculated. There were no immediate procedural complications. The patient was transferred to the post catheterization recovery area for further monitoring.  Procedural Findings: Hemodynamics (mmHg) RA mean 3 RV 28/4 PA 30/13, mean 19 PCWP mean 16  Oxygen saturations: PA 66% AO 94%  Cardiac Output (Fick) 4.58  Cardiac Index (Fick) 2.27   Final Conclusions:  Normal right and near normal left heart filling pressure.  No pulmonary hypertension.  Cardiac output ok.  I do not think that he needs Lasix.  It appears that his dyspnea is probably primarily pulmonary-related.   Loralie Champagne 03/03/2014, 1:25 PM

## 2014-03-03 NOTE — Discharge Instructions (Signed)
Angiogram, Care After °Refer to this sheet in the next few weeks. These instructions provide you with information on caring for yourself after your procedure. Your health care provider may also give you more specific instructions. Your treatment has been planned according to current medical practices, but problems sometimes occur. Call your health care provider if you have any problems or questions after your procedure.  °WHAT TO EXPECT AFTER THE PROCEDURE °After your procedure, it is typical to have the following sensations: °· Minor discomfort or tenderness and a small bump at the catheter insertion site. The bump should usually decrease in size and tenderness within 1 to 2 weeks. °· Any bruising will usually fade within 2 to 4 weeks. °HOME CARE INSTRUCTIONS  °· You may need to keep taking blood thinners if they were prescribed for you. Take medicines only as directed by your health care provider. °· Do not apply powder or lotion to the site. °· Do not take baths, swim, or use a hot tub until your health care provider approves. °· You may shower 24 hours after the procedure. Remove the bandage (dressing) and gently wash the site with plain soap and water. Gently pat the site dry. °· Inspect the site at least twice daily. °· Limit your activity for the first 48 hours. Do not bend, squat, or lift anything over 20 lb (9 kg) or as directed by your health care provider. °· Plan to have someone take you home after the procedure. Follow instructions about when you can drive or return to work. °SEEK MEDICAL CARE IF: °· You get light-headed when standing up. °· You have drainage (other than a small amount of blood on the dressing). °· You have chills. °· You have a fever. °· You have redness, warmth, swelling, or pain at the insertion site. °SEEK IMMEDIATE MEDICAL CARE IF:  °· You develop chest pain or shortness of breath, feel faint, or pass out. °· You have bleeding, swelling larger than a walnut, or drainage from the  catheter insertion site. °· You develop pain, discoloration, coldness, or severe bruising in the leg or arm that held the catheter. °· You have heavy bleeding from the site. If this happens, hold pressure on the site and call 911. °MAKE SURE YOU: °· Understand these instructions. °· Will watch your condition. °· Will get help right away if you are not doing well or get worse. °Document Released: 12/09/2004 Document Revised: 10/07/2013 Document Reviewed: 10/15/2012 °ExitCare® Patient Information ©2015 [employer]. This information is not intended to replace advice given to you by your health care provider. Make sure you discuss any questions you have with your health care provider. ° °
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
## 2014-03-03 NOTE — Interval H&P Note (Signed)
History and Physical Interval Note:  03/03/2014 12:54 PM  [facility]  has presented today for surgery, with the diagnosis of shrotness of breath  The various methods of treatment have been discussed with the patient and family. After consideration of risks, benefits and other options for treatment, the patient has consented to  Procedure(s): RIGHT HEART CATH (N/A) as a surgical intervention .  The patient's history has been reviewed, patient examined, no change in status, stable for surgery.  I have reviewed the patient's chart and labs.  Questions were answered to the patient's satisfaction.     [name]

## 2014-03-05 ENCOUNTER — Other Ambulatory Visit: Payer: Self-pay | Admitting: Otolaryngology

## 2014-03-05 DIAGNOSIS — R29810 Facial weakness: Secondary | ICD-10-CM

## 2014-03-06 ENCOUNTER — Ambulatory Visit
Admission: RE | Admit: 2014-03-06 | Discharge: 2014-03-06 | Disposition: A | Discharge status: Home / Self Care | Payer: Medicare Other | Source: Ambulatory Visit | Attending: Otolaryngology | Admitting: Otolaryngology

## 2014-03-06 DIAGNOSIS — R29810 Facial weakness: Secondary | ICD-10-CM

## 2014-03-06 MED ORDER — GADOBENATE DIMEGLUMINE 529 MG/ML IV SOLN
15.0000 mL | Freq: Once | INTRAVENOUS | Status: AC | PRN
  Administered 2014-03-06: 15 mL via INTRAVENOUS

## 2014-03-07 ENCOUNTER — Telehealth: Payer: Self-pay | Admitting: Internal Medicine

## 2014-03-07 DIAGNOSIS — C089 Malignant neoplasm of major salivary gland, unspecified: Secondary | ICD-10-CM

## 2014-03-07 NOTE — Telephone Encounter (Signed)
Called the nurse back at number taken and the office is now closed.  Will need to call back on Monday.

## 2014-03-10 ENCOUNTER — Ambulatory Visit (INDEPENDENT_AMBULATORY_CARE_PROVIDER_SITE_OTHER)
Admission: RE | Admit: 2014-03-10 | Discharge: 2014-03-10 | Disposition: A | Discharge status: Home / Self Care | Payer: Medicare Other | Source: Ambulatory Visit | Attending: Otolaryngology | Admitting: Otolaryngology

## 2014-03-10 ENCOUNTER — Ambulatory Visit (INDEPENDENT_AMBULATORY_CARE_PROVIDER_SITE_OTHER)
Admission: RE | Admit: 2014-03-10 | Discharge: 2014-03-10 | Disposition: A | Discharge status: Home / Self Care | Payer: Medicare Other | Source: Ambulatory Visit | Attending: Internal Medicine | Admitting: Internal Medicine

## 2014-03-10 ENCOUNTER — Encounter: Payer: Self-pay | Admitting: Internal Medicine

## 2014-03-10 ENCOUNTER — Other Ambulatory Visit: Payer: Self-pay | Admitting: Internal Medicine

## 2014-03-10 ENCOUNTER — Ambulatory Visit (INDEPENDENT_AMBULATORY_CARE_PROVIDER_SITE_OTHER): Payer: Medicare Other | Admitting: Internal Medicine

## 2014-03-10 VITALS — BP 102/68 | HR 97 | Ht 70.0 in | Wt 187.0 lb

## 2014-03-10 DIAGNOSIS — R0689 Other abnormalities of breathing: Secondary | ICD-10-CM

## 2014-03-10 DIAGNOSIS — J181 Lobar pneumonia, unspecified organism: Principal | ICD-10-CM

## 2014-03-10 DIAGNOSIS — C089 Malignant neoplasm of major salivary gland, unspecified: Secondary | ICD-10-CM

## 2014-03-10 DIAGNOSIS — R0600 Dyspnea, unspecified: Secondary | ICD-10-CM

## 2014-03-10 DIAGNOSIS — J986 Disorders of diaphragm: Secondary | ICD-10-CM

## 2014-03-10 DIAGNOSIS — J189 Pneumonia, unspecified organism: Secondary | ICD-10-CM

## 2014-03-10 HISTORY — DX: Malignant neoplasm of major salivary gland, unspecified: C08.9

## 2014-03-10 MED ORDER — IOHEXOL 300 MG/ML  SOLN
80.0000 mL | Freq: Once | INTRAMUSCULAR | Status: AC | PRN
  Administered 2014-03-10: 80 mL via INTRAVENOUS

## 2014-03-10 NOTE — Telephone Encounter (Signed)
Received call from Dr. Redmond Baseman office requesting neck CT for pt. Pt currently at imaging center for chest CT. Order placed per Dr. Redmond Baseman' request. Nothing further needed.

## 2014-03-10 NOTE — Progress Notes (Signed)
Subjective:    Patient ID: Adrian West, male    DOB: [birth year], 80 y.o.   MRN: [id number]  HPI   hjad seen him 10/22/13 as followup from his visit to [place], [place]. 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 Instead she was concerned about possible a) aspiration and b) ILD c) CLL. Precise notes are unavailable at this time as well although as he was exiting the office I noticed he had the GI notes from [place]; I will review these  IT appears that 10/05/13 he arrived in [place], [place]  And by 10/07/13 he developed cough, congestion and felt sick. It appears that CT scan of chest wihtin a few days of arrival   (10/07/13 per appt records he ha with him) did not show any penumonia (date and image NA but on its way from Dr Jeannine Kitten via fedex). Review of some records he has with him shows urgent care visit 10/09/13 with sudden onset cough x 1 day and hypoxemia. He refused admission, ER visit and CXR. He then says, he continued to be sick and on 10/15/13 he showed me a CXR report at [facility], [place] showing new Left Upper Mid Zone pneumonia (he did not show me this at last visit). He arrived back in [place] on 51/3/15 and was taken straight to local ER where pneumonia was diagnosed and giving levaquin (meanwhile Dr Jeannine Kitten had called in  on agumenting + doxy but he never took this). When I saw him 10/22/13 advised him to continue levaquin 5 more days. Same day PCP [name], MD did CXR that showed LUL mass like infitlrate. Then on 10/31/13 had CT chest whtat showed 4.7cm lingular.left upper lobe mass like density. Our radiolgoist concerned it was cancer. I then contacted Dr Jeannine Kitten who reviewed her CT and images of 10/31/13 I ahd emailed; the current mass like density is NEW. They called me in panic. Wife was upset and felt he needed more abx. I initially wanted him on augmentin but he  told my triage he did not have this anymore. So we called in levaquin again for 7 days.   Now 2-3 more days of levaquin remaining. Wife says cough, congestion are improved but still with decreased po, decreased appetite and also weight loss of 20# since arriving in [place] 10/05/13 one month ago . HE is complaining of persistent vertigo   OV 11/26/2013  Chief Complaint  Patient presents with  . Follow-up    CXR done today--Still having sob with exertion.  Denies wheezing, tightness or cough. Discuss results of testing done at [facility] in [place]   He is feeling better after his pneumonia Rx. In fact he says best he has felt in long time. I only got thevarious records of his [place] workup few to several days ago. There is a stack of them; not reviewed them yet. In any event, need to have this pneumonia resolve completely before implementing their recommendation. CXR 11/26/13 shos significant  Improvement but radiology is concerned about underlying mass; DR Jeannine Kitten over phone had not indicated presence of mass   No results found. Dg Chest 2 View  11/26/2013   CLINICAL DATA:  Followup pneumonia.  EXAM: CHEST  2 VIEW  COMPARISON:  10/22/2013, 09/17/2012 and chest CT 10/31/2013  FINDINGS: Lungs are adequately inflated with chronic bibasilar interstitial disease. There  is continued opacification of the left perihilar/lingular region slightly improved. No definite effusion. Cardiomediastinal silhouette and remainder of the exam is unchanged.  IMPRESSION: Improving left perihilar/lingular opacification suggesting improving infection, however cannot exclude underlying mass as suggested on recent chest CT. Recommend continued followup to resolution and followup CT if this abnormality persists.  Chronic bibasilar interstitial disease.   Electronically Signed   By: Marin Olp M.D.   On: 11/26/2013 10:02   OV 01/10/2014  Chief Complaint  Patient presents with  . Follow-up    Pt here after CT scan.  Pt states he has no change in breathing. Denies CP and cough. C/o doe.     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   Since June 2015 visits are followup of a May 2015  [facility] [place] [facility] his [facility]nea. Then had several series of recommendations for [place]  to continue in terms of dyspnea workup and mgmt. Mostly it seems that diaphragm function is improved and they are recommending re-testing his heart completely (right heart and left). However, this has been on hold due to him developing left upper lobe pneumonia during trip to [place], [place] in May 2015. Today he is here with followup of the CT scan for followup of pneumonia. The official report is pending but in my opinion the pneumonia is 90% resolved. He still has basal interstitial lung disease changes possibly. Also of note he did have a CT abdomen to evaluate his renal mass which is status post cryoablation the official results of this are also pending.  NEW ISSUE: Off note, in the last 10 days he has developed acute onset left eyelid twitching along with right parotid swelling without any pain. This was supported by his hematologist Steele Sizer is undergoing CLL evaluation] today and he has an appointment with ENT pending today   CT Ct Chest W Contrast  01/10/2014   IMPRESSION: Improved aeration of the left upper lobe with mild linear opacity, most in keeping with resolving pneumonia with mild residual infiltrate versus scarring.  Decreased size of mediastinal lymph nodes, with a subcarinal node measuring 1.5 cm short axis warranting continued attention on follow-up.  Dilated right and left pulmonary arteries may reflect pulmonary arterial hypertension. Bilateral lower lobe opacities/ scarring persists.  Post ablation changes lower pole left kidney. Higher attenuation component along the superior rim is indeterminate (likely post treatment however cannot  exclude a residual enhancing component as no unenhanced images were obtained). Recommend renal MRI.  Indeterminate sub cm hypodensity upper/ interpolar left kidney, unchanged.   Electronically Signed   By: Carlos Levering M.D.   On: 01/10/2014 16:27     OV 02/05/2014  Chief Complaint  Patient presents with  . Follow-up    Pt states he has bells palsy on right side. Pt c/o DOE, dry hacking cough. Pt states he had "severe chest pain for 5 minutes". Pt stated he did not take anything to alleviate CP but relaxed and the CP resolved.     Followup multifactorial dyspnea  - His workup recommendations from [facility] has been interrupted because of left upper lobe pneumonia sustained while he was in [place] in May 2015. It is now resolved. We tried to initiate workup for dyspnea again but then at last visit he developed some problems with his face and so his dyspnea workup had to be deferred again. He now tells me that he has been diagnosed with Bell's  palsy by ENT and he is on no specific treatment.  Continues to have profound dyspnea at rest and with exertion. Class 3 levels. He is still frustrated. He has appointment with cardiology now for a cardiac stress test. I am not sure if a right heart evaluation is pending but I have written to cardiology about this. Of note he wants to repeat  SNIFTT  testing show that his diaphragm function has returned as they've said in [place]. Of note, also for the first time he desaturated with exertion and he has new onset crackles in his lower lobes.  Past medical history: He has new onset of loss of appetite for the last 2 weeks and loss of taste. I've asked him to talk to his primary care physician about this. He also had a transient chest pain but his cardiology appointment pending     OV 03/10/2014  Chief Complaint  Patient presents with  . Follow-up    Pt still has bell's palsy on right side. Pt states his breathing has imporved since last OV. Pt denies  cough and CP/tightness.     Followup Dyspnea   - Dyspnea continues. He had cardiac cath right heart and was normal. TOday CT shows persistentce of definite ILD findings. Possible UIP pattern. Dr Jeannine Kitten at [facility] recommend GI swallow eval and bronch bal with concern for aspiration. Last visit he did desaturate with exertion but today he did not. His ono was abnormal but he decline o2    - the over-arching issue is that his "bell's palsy" got worse. HE has permanent lid lag and red right conjunctiva. 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 Dr Redmond Baseman thinks ENT workup should get top priority over everything else at this moment. We agreed.  Patient himself wants to go to [place] to see the [facility] with [place]. Feels he is a86 and does not mind the risk. Feels he has lived his life     Ct Soft Tissue Neck W Contrast  03/10/2014   CLINICAL DATA:  Right facial weakness. Malignant neoplasm parotid gland. History of right ear lobe lesion, possible squamous cell carcinoma.  EXAM: CT NECK WITH CONTRAST  TECHNIQUE: Multidetector CT imaging of the neck was performed using the standard protocol following the bolus administration of intravenous contrast.  CONTRAST:  59mL OMNIPAQUE IOHEXOL 300 MG/ML  SOLN  COMPARISON:  MRI head 03/06/2014  FINDINGS: Advanced cervical spondylosis with multilevel disc and facet degeneration. 3 mm anterior slip C2-3 related to degenerative change. No fracture or bony mass. 3 mm anterior slip C7-T1. Lung apices are clear. Severe chronic sinusitis. Lobular soft tissue density with bony thinning in the left maxillary sinus likely a chronic mucocele.  Rectangular-shaped fatty density within the right parotid gland. This has the appearance of the postsurgical defect versus focal atrophy of the gland.  Soft tissue mass is present posterior to the mandibular condyle just below the  external auditory canal. This measures approximately 16 mm in diameter and may represent tumor. This may extend into the deep lobe of the parotid and is near the facial nerve. 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 Biopsy is suggested. No bony destruction.  IMPRESSION: 16 mm soft tissue mass posterior to the mandibular condyle, worrisome for recurrent carcinoma. Given the MRI findings, there is likely perineural spread of  tumor along the facial nerve of the descending temporal segment.  Fatty cleft within the right parotid gland appears benign and may be related to prior surgical biopsy.   Electronically Signed   By: Franchot Gallo M.D.   On: 03/10/2014 16:56   Ct Chest High Resolution  03/10/2014   CLINICAL DATA:  80 year old male with chronic shortness of breath and chronically paralyzed right hemidiaphragm. Evaluate for interstitial lung disease.  EXAM: CT CHEST WITHOUT CONTRAST  TECHNIQUE: Multidetector CT imaging of the chest was performed following the standard protocol without intravenous contrast. High resolution imaging of the lungs, as well as inspiratory and expiratory imaging, was performed.  COMPARISON:  Chest CT 10/31/2013.  FINDINGS: Mediastinum: Mild cardiomegaly with left atrial dilatation. Status post mitral annuloplasty. Calcifications of the mitral subvalvular apparatus. There is no significant pericardial fluid, thickening or pericardial calcification. There is atherosclerosis of the thoracic aorta, the great vessels of the mediastinum and the coronary arteries, including calcified atherosclerotic plaque in the left anterior descending and left circumflex coronary arteries. The pulmonic trunk is normal in caliber measuring 2.8 cm in diameter. However, the left and right main pulmonary arteries are both dilated measuring 3.7 cm in diameter bilaterally. No pathologically enlarged mediastinal or  hilar lymph nodes. Please note that accurate exclusion of hilar adenopathy is limited on noncontrast CT scans. Esophagus is unremarkable in appearance.  Lungs/Pleura: Previously noted mass like opacity in the left upper lobe has completely resolved, indicative of an area of infection on the prior study. No suspicious appearing pulmonary nodules or masses are noted on today's examination. There are patchy areas of mild ground-glass attenuation and septal thickening which are most apparent throughout the lung bases bilaterally, where there is extensive subpleural reticulation. Some parenchymal banding is also noted, particularly in the inferior segment of the lingula. No traction bronchiectasis, and no frank honeycombing. Inspiratory and expiratory imaging demonstrates mild air trapping, indicative of mild small airways disease. No confluent consolidation. No pleural effusions.  Upper Abdomen: Status post cholecystectomy.  Musculoskeletal: Old healed fracture of the sternum incidentally noted. There are no aggressive appearing lytic or blastic lesions noted in the visualized portions of the skeleton.  IMPRESSION: 1. The appearance of the lungs mid suggests underlying interstitial lung disease, and the findings are favored to reflect nonspecific interstitial pneumonia (NSIP) at this time. However, there is a rather strong craniocaudal gradient with greatest involvement in the lung bases. Strictly speaking, early usual interstitial pneumonia (UIP) is difficult to exclude. Accordingly, a followup high-resolution chest CT is recommended in 1 year to assess for temporal changes in the appearance of the lung parenchyma. 2. Although the pulmonic trunk is normal in caliber, the left and right main pulmonary arteries are both markedly dilated (3.7 cm in diameter each). These findings could indicate pulmonary hypertension. 3. Mild cardiomegaly with left atrial dilatation. 4. Status post mitral annuloplasty. 5. Additional  incidental findings, as above.   Electronically Signed   By: Vinnie Langton M.D.   On: 03/10/2014 10:04      Review of Systems  Constitutional: Negative for fever and unexpected weight change.  HENT: Positive for ear pain. Negative for congestion, dental problem, nosebleeds, postnasal drip, rhinorrhea, sinus pressure, sneezing, sore throat and trouble swallowing.   Eyes: Negative for redness and itching.  Respiratory: Positive for shortness of breath. Negative for cough, chest tightness and wheezing.   Cardiovascular: Negative for palpitations and leg swelling.  Gastrointestinal: Negative for nausea and vomiting.  Genitourinary: Negative for dysuria.  Musculoskeletal:  Negative for joint swelling.  Skin: Negative for rash.  Neurological: Negative for headaches.  Hematological: Does not bruise/bleed easily.  Psychiatric/Behavioral: Negative for dysphoric mood. The patient is not nervous/anxious.        Objective:   Physical Exam  Filed Vitals:   03/10/14 1343  BP: 102/68  Pulse: 97  Height: 5\' 10"  (1.778 m)  Weight: 187 lb (84.823 kg)  SpO2: 94%      vitals reviewed. All below are unchanged Constitutional:  HENT:  Bilateral  PAROTID SWELLING R> L, SOFT, NON TENDER,  Rt eye lid no movement - facial nerve weakness _ Head: Normocephalic and atraumatic.  Right Ear: External ear normal.  Left Ear: External ear normal.  Mouth/Throat: Oropharynx is clear and moist. No oropharyngeal exudate.  Eyes: Conjunctivae and EOM are normal. Pupils are equal, round, and reactive to light. Right eye exhibits no discharge. Left eye exhibits no discharge. No scleral icterus.  Neck: Normal range of motion. Neck supple. No JVD present. No tracheal deviation present. No thyromegaly present.  Cardiovascular: Normal rate, regular rhythm and intact distal pulses.  Exam reveals no gallop and no friction rub.   No murmur heard. Pulmonary/Chest: Effort normal. No respiratory distress. He has no  wheezes. BASAL RALES +es. He exhibits no tenderness.  HE IS PUFFING  Abdominal: Soft. Bowel sounds are normal. He exhibits no distension and no mass. There is no tenderness. There is no rebound and no guarding.  Musculoskeletal: Normal range of motion. He exhibits no edema and no tenderness.       Walks with hunch  Lymphadenopathy:    He has no cervical adenopathy.  Neurological: He is alert and oriented to person, place, and time. He has normal reflexes. No cranial nerve deficit. Coordination normal.       Mild LUE tremor  Skin: Skin is warm and dry. No rash noted. He is not diaphoretic. No erythema. No pallor.  Psychiatric: He has a normal mood and affect. His behavior is normal. Judgment and thought content normal.             Assessment & Plan:  #shortness of breath  - you have developed Interstitial Lung Disease (ILD) this year  - this needs workup but we are going to hold off till your right salivary tumor issue is taken care off  #FOllowup  2 months or sooner if needed   (> 50% of this 15 min visit spent in face to face counseling)   Dr. Brand Males, M.D., [facility].C.P Pulmonary and Critical Care Medicine Staff Physician [place] [facility] Pager: [phone], If no answer or between  15:00h - 7:00h: call [phone]  03/10/2014 5:49 PM

## 2014-03-10 NOTE — Patient Instructions (Signed)
#  shortness of breath  - you have developed Interstitial Lung Disease (ILD) this year  - this needs workup but we are going to hold off till your right salivary tumor issue is taken care off  #FOllowup  2 months or sooner if needed

## 2014-03-11 ENCOUNTER — Telehealth: Payer: Self-pay | Admitting: Internal Medicine

## 2014-03-11 ENCOUNTER — Other Ambulatory Visit: Payer: Self-pay | Admitting: Otolaryngology

## 2014-03-11 ENCOUNTER — Other Ambulatory Visit (HOSPITAL_COMMUNITY)
Admission: RE | Admit: 2014-03-11 | Discharge: 2014-03-11 | Disposition: A | Discharge status: Home / Self Care | Payer: Medicare Other | Source: Ambulatory Visit | Attending: Otolaryngology | Admitting: Otolaryngology

## 2014-03-11 DIAGNOSIS — R220 Localized swelling, mass and lump, head: Secondary | ICD-10-CM | POA: Insufficient documentation

## 2014-03-11 NOTE — Telephone Encounter (Signed)
Daneil Dan  Could you have ROse at [facility] make hard copies of CD rom of both ct neck and chest for Adrian West from yesterday please. Once done let me know they have to be mailed out by usps, fedex or ups to Dr Ellis [facility] at [facility]  Thanks  Dr. Brand Males, M.D., [facility].C.P Pulmonary and Critical Care Medicine Staff Physician [place] [facility] Pager: [phone], If no answer or between  15:00h - 7:00h: call [phone]  03/11/2014 7:12 AM

## 2014-03-12 ENCOUNTER — Telehealth: Payer: Self-pay | Admitting: Internal Medicine

## 2014-03-12 NOTE — Telephone Encounter (Signed)
Called and spoke to Adrian West at [facility]. Both scans will be placed on disc and she is going to send them over to the office so we can mail it out. Will forward to MR to make aware.

## 2014-03-12 NOTE — Telephone Encounter (Signed)
Noted.  Will send to [facility] and MR as FYI.

## 2014-03-13 NOTE — Telephone Encounter (Signed)
Please send CD by registered post to ILet me know when posted  Amy L. Jeannine Kitten, MD c/o [facility] [address] [place], [place]

## 2014-03-14 NOTE — Telephone Encounter (Signed)
Adrian West CD and address to be fedex to the provided address. Nothing further needed at this time.   Will forward to MR to make aware.

## 2014-03-20 ENCOUNTER — Other Ambulatory Visit (HOSPITAL_COMMUNITY): Payer: Self-pay | Admitting: Otolaryngology

## 2014-03-20 DIAGNOSIS — R29810 Facial weakness: Secondary | ICD-10-CM

## 2014-03-20 DIAGNOSIS — C07 Malignant neoplasm of parotid gland: Secondary | ICD-10-CM

## 2014-03-21 NOTE — Telephone Encounter (Signed)
Will sign and forward as [employer]

## 2014-03-27 ENCOUNTER — Inpatient Hospital Stay (HOSPITAL_COMMUNITY): Admission: RE | Admit: 2014-03-27 | Payer: Medicare Other | Source: Ambulatory Visit

## 2014-03-27 ENCOUNTER — Ambulatory Visit (HOSPITAL_COMMUNITY): Admission: RE | Admit: 2014-03-27 | Payer: Medicare Other | Source: Ambulatory Visit

## 2014-03-28 ENCOUNTER — Other Ambulatory Visit: Payer: Self-pay | Admitting: Radiology

## 2014-03-31 ENCOUNTER — Other Ambulatory Visit (HOSPITAL_COMMUNITY): Payer: Self-pay | Admitting: Otolaryngology

## 2014-03-31 ENCOUNTER — Encounter (HOSPITAL_COMMUNITY): Payer: Self-pay

## 2014-03-31 ENCOUNTER — Ambulatory Visit (HOSPITAL_COMMUNITY)
Admission: RE | Admit: 2014-03-31 | Discharge: 2014-03-31 | Disposition: A | Discharge status: Home / Self Care | Payer: Medicare Other | Source: Ambulatory Visit | Attending: Otolaryngology | Admitting: Otolaryngology

## 2014-03-31 DIAGNOSIS — C07 Malignant neoplasm of parotid gland: Secondary | ICD-10-CM

## 2014-03-31 DIAGNOSIS — Z7982 Long term (current) use of aspirin: Secondary | ICD-10-CM | POA: Diagnosis not present

## 2014-03-31 DIAGNOSIS — R29810 Facial weakness: Secondary | ICD-10-CM

## 2014-03-31 DIAGNOSIS — I10 Essential (primary) hypertension: Secondary | ICD-10-CM | POA: Insufficient documentation

## 2014-03-31 DIAGNOSIS — R220 Localized swelling, mass and lump, head: Secondary | ICD-10-CM | POA: Diagnosis not present

## 2014-03-31 DIAGNOSIS — G510 Bell's palsy: Secondary | ICD-10-CM | POA: Diagnosis not present

## 2014-03-31 DIAGNOSIS — Z8673 Personal history of transient ischemic attack (TIA), and cerebral infarction without residual deficits: Secondary | ICD-10-CM | POA: Insufficient documentation

## 2014-03-31 DIAGNOSIS — K219 Gastro-esophageal reflux disease without esophagitis: Secondary | ICD-10-CM | POA: Insufficient documentation

## 2014-03-31 DIAGNOSIS — E785 Hyperlipidemia, unspecified: Secondary | ICD-10-CM | POA: Diagnosis not present

## 2014-03-31 LAB — CBC WITH DIFFERENTIAL/PLATELET
BASOS ABS: 0 10*3/uL (ref 0.0–0.1)
Basophils Relative: 0 % (ref 0–1)
Eosinophils Absolute: 0.1 10*3/uL (ref 0.0–0.7)
Eosinophils Relative: 2 % (ref 0–5)
HCT: 44.9 % (ref 39.0–52.0)
Hemoglobin: 15 g/dL (ref 13.0–17.0)
Lymphocytes Relative: 11 % — ABNORMAL LOW (ref 12–46)
Lymphs Abs: 0.8 10*3/uL (ref 0.7–4.0)
MCH: 31.3 pg (ref 26.0–34.0)
MCHC: 33.4 g/dL (ref 30.0–36.0)
MCV: 93.7 fL (ref 78.0–100.0)
Monocytes Absolute: 0.3 10*3/uL (ref 0.1–1.0)
Monocytes Relative: 5 % (ref 3–12)
Neutro Abs: 6 10*3/uL (ref 1.7–7.7)
Neutrophils Relative %: 82 % — ABNORMAL HIGH (ref 43–77)
Platelets: 326 10*3/uL (ref 150–400)
RBC: 4.79 MIL/uL (ref 4.22–5.81)
RDW: 13 % (ref 11.5–15.5)
WBC: 7.2 10*3/uL (ref 4.0–10.5)

## 2014-03-31 LAB — PROTIME-INR
INR: 1 (ref 0.00–1.49)
Prothrombin Time: 13.3 seconds (ref 11.6–15.2)

## 2014-03-31 MED ORDER — FENTANYL CITRATE 0.05 MG/ML IJ SOLN
INTRAMUSCULAR | Status: AC | PRN
  Administered 2014-03-31 (×2): 50 ug via INTRAVENOUS

## 2014-03-31 MED ORDER — MIDAZOLAM HCL 2 MG/2ML IJ SOLN
INTRAMUSCULAR | Status: AC
  Filled 2014-03-31: qty 6

## 2014-03-31 MED ORDER — FENTANYL CITRATE 0.05 MG/ML IJ SOLN
INTRAMUSCULAR | Status: AC
  Filled 2014-03-31: qty 6

## 2014-03-31 MED ORDER — FLUMAZENIL 0.5 MG/5ML IV SOLN
INTRAVENOUS | Status: DC
Start: 2014-03-31 — End: 2014-03-31
  Filled 2014-03-31: qty 5

## 2014-03-31 MED ORDER — MIDAZOLAM HCL 2 MG/2ML IJ SOLN
INTRAMUSCULAR | Status: AC | PRN
  Administered 2014-03-31: 0.5 mg via INTRAVENOUS
  Administered 2014-03-31 (×2): 1 mg via INTRAVENOUS
  Administered 2014-03-31: 0.5 mg via INTRAVENOUS

## 2014-03-31 MED ORDER — NALOXONE HCL 0.4 MG/ML IJ SOLN
INTRAMUSCULAR | Status: AC
  Filled 2014-03-31: qty 1

## 2014-03-31 MED ORDER — SODIUM CHLORIDE 0.9 % IV SOLN
INTRAVENOUS | Status: DC
  Administered 2014-03-31: 12:00:00 via INTRAVENOUS

## 2014-03-31 NOTE — H&P (Signed)
Chief Complaint: "I'm here for a biopsy" Referring Physician:Bates HPI: Adrian West is an 80 y.o. male with right retromandibular mass. He has been have facial nerve symptoms associated with this. He is scheduled today for US guided biopsy of this lesion. PMHX, meds, labs reviewed. Plavix has been held the past 5 days. He has been NPO today.  Past Medical History:  Past Medical History  Diagnosis Date  . Thyroid disease   . Hypertension   . GERD (gastroesophageal reflux disease)     PMH of stricture , recurrent;[name]  . Benign neoplasm of colon   . Tremor, essential   . Diverticulosis   . TIA (transient ischemic attack) May 2012  . Skin cancer   . Neuropathy, peripheral   . Lung abnormality 11/25/2008    "nerve damage from heart surgery"  . Hyperlipidemia   . MGUS (monoclonal gammopathy of unknown significance) 04/11/2013  . Diaphragm paralysis   . Esophageal stricture   . Hiatal hernia   . Bell's palsy     Past Surgical History:  Past Surgical History  Procedure Laterality Date  . Appendectomy    . Cataract extraction      [name]  . Cholecystectomy  1998    [name]  . Inguinal hernia repair    . Lumbar laminectomy    . Nasal sinus surgery    . Tonsillectomy    . Esophageal dilation       X3;[name]  . 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 Laparoscopic ablation renal mass  08/14/2009  . Mohs surgery  2012     ear    Family History:  Family History  Problem Relation Age of Onset  . Heart attack Father 66  . Colon cancer Mother 19  . Heart attack Maternal Grandmother 60  . Uterine cancer Paternal Grandmother   . Stroke Neg Hx   . Diabetes Neg Hx   . Healthy Sister     Social History:  reports that he has never smoked. He has never used smokeless tobacco. He reports that he does not drink alcohol or use illicit drugs.  Allergies: No Known Allergies  Medications:    Medication List    ASK your doctor about these medications       aspirin EC 81 MG tablet  Take 81 mg by mouth daily.     atorvastatin 10 MG tablet  Commonly known as:  LIPITOR  Take 10 mg by mouth daily.     clopidogrel 75 MG tablet  Commonly known as:  PLAVIX  Take 75 mg by mouth daily.     ibuprofen 600 MG tablet  Commonly known as:  ADVIL,MOTRIN  Take 600 mg by mouth 3 (three) times daily.     levothyroxine 150 MCG tablet  Commonly known as:  SYNTHROID, LEVOTHROID  Take 150 mcg by mouth daily before breakfast.     metoprolol succinate 25 MG 24 hr tablet  Commonly known as:  TOPROL-XL  Take 25 mg by mouth daily.     multivitamin with minerals Tabs tablet  Take 1 tablet by mouth daily.     omeprazole 40 MG capsule  Commonly known as:  PRILOSEC  Take 40 mg by mouth daily.     traMADol 50 MG tablet  Commonly known as:  ULTRAM  Take 50 mg by mouth every 6 (six) hours as needed for moderate pain.  Please HPI for pertinent positives, otherwise complete 10 system ROS negative.  Physical Exam: BP 135/79  Pulse 70  Resp 18  SpO2 98% There is no weight on file to calculate BMI.   General Appearance:  Alert, cooperative, no distress, appears stated age  Head:  Obvious facial neuropathy with (R)lower lid ptosis and facial droop. Tongue midline  ENT: Unremarkable airway  Neck: Supple, symmetrical, trachea midline  Lungs:   Clear to auscultation bilaterally, no w/r/r, respirations unlabored without use of accessory muscles.  Chest Wall:  No tenderness or deformity  Heart:  Regular rate and rhythm, S1, S2 normal, no murmur, rub or gallop.  Neurologic: Normal affect, no gross deficits.  Labs: Results for orders placed during the hospital encounter of 03/31/14 (from the past 48 hour(s))  CBC WITH DIFFERENTIAL     Status: Abnormal   Collection Time    03/31/14 11:40 AM      Result Value Ref Range   WBC 7.2  4.0 - 10.5 K/uL   RBC 4.79  4.22 - 5.81 MIL/uL    Hemoglobin 15.0  13.0 - 17.0 g/dL   HCT 44.9  39.0 - 52.0 %   MCV 93.7  78.0 - 100.0 fL   MCH 31.3  26.0 - 34.0 pg   MCHC 33.4  30.0 - 36.0 g/dL   RDW 13.0  11.5 - 15.5 %   Platelets 326  150 - 400 K/uL   Neutrophils Relative % 82 (*) 43 - 77 %   Neutro Abs 6.0  1.7 - 7.7 K/uL   Lymphocytes Relative 11 (*) 12 - 46 %   Lymphs Abs 0.8  0.7 - 4.0 K/uL   Monocytes Relative 5  3 - 12 %   Monocytes Absolute 0.3  0.1 - 1.0 K/uL   Eosinophils Relative 2  0 - 5 %   Eosinophils Absolute 0.1  0.0 - 0.7 K/uL   Basophils Relative 0  0 - 1 %   Basophils Absolute 0.0  0.0 - 0.1 K/uL    Imaging: No results found.  Assessment/Plan (R)retromandicular mass For US guided biopsy today. Explained procedure, risks, complications, use of sedation Labs reviewed. Consent signed in chart  Ascencion Dike PA-C 03/31/2014, 12:03 PM

## 2014-03-31 NOTE — Progress Notes (Signed)
Per Drake Leach PA Biopsy site OK. Fluid from procedure will try to get here to see. OK to d/c per Dr. Kathlene Cote.

## 2014-03-31 NOTE — Progress Notes (Signed)
Site checked by Dr. Kathlene Cote.

## 2014-03-31 NOTE — H&P (Signed)
Agree.  Plan for image guided biopsy of right facial mass.  Will check with ultrasound first to see if visible.

## 2014-03-31 NOTE — Sedation Documentation (Signed)
Post imaging, Dr. Kathlene Cote decided to perform biopsy in CT.  Pt transported on Stretcher.

## 2014-03-31 NOTE — Discharge Instructions (Signed)
Biopsy Care After  Keep head of bed elevated. May remove bandaid  in 24 hours and bathe as usual.Call [facility] at [phone] for profuse bleeding or swelling, fever or pain not relieved by Tylenol.   Refer to this sheet in the next few weeks. These instructions provide you with information on caring for yourself after your procedure. Your caregiver may also give you more specific instructions. Your treatment has been planned according to current medical practices, but problems sometimes occur. Call your caregiver if you have any problems or questions after your procedure. If you had a fine needle biopsy, you may have soreness at the biopsy site for 1 to 2 days. If you had an open biopsy, you may have soreness at the biopsy site for 3 to 4 days. HOME CARE INSTRUCTIONS   You may resume normal diet and activities as directed.  Change bandages (dressings) as directed. If your wound was closed with a skin glue (adhesive), it will wear off and begin to peel in 7 days.  Only take over-the-counter or prescription medicines for pain, discomfort, or fever as directed by your caregiver.  Ask your caregiver when you can bathe and get your wound wet. SEEK IMMEDIATE MEDICAL CARE IF:   You have increased bleeding (more than a small spot) from the biopsy site.  You notice redness, swelling, or increasing pain at the biopsy site.  You have pus coming from the biopsy site.  You have a fever.  You notice a bad smell coming from the biopsy site or dressing.  You have a rash, have difficulty breathing, or have any allergic problems. MAKE SURE YOU:   Understand these instructions.  Will watch your condition.  Will get help right away if you are not doing well or get worse. Document Released: 12/10/2004 Document Revised: 08/15/2011 Document Reviewed: 11/18/2010 [facility] Patient Information 2015 [place], [place]. This information is not intended to replace advice given to you by your health  care provider. Make sure you discuss any questions you have with your health care provider. Conscious Sedation, Adult, Care After Refer to this sheet in the next few weeks. These instructions provide you with information on caring for yourself after your procedure. Your health care provider may also give you more specific instructions. Your treatment has been planned according to current medical practices, but problems sometimes occur. Call your health care provider if you have any problems or questions after your procedure. WHAT TO EXPECT AFTER THE PROCEDURE  After your procedure:  You may feel sleepy, clumsy, and have poor balance for several hours.  Vomiting may occur if you eat too soon after the procedure. HOME CARE INSTRUCTIONS  Do not participate in any activities where you could become injured for at least 24 hours. Do not:  Drive.  Swim.  Ride a bicycle.  Operate heavy machinery.  Cook.  Use power tools.  Climb ladders.  Work from a high place.  Do not make important decisions or sign legal documents until you are improved.  If you vomit, drink water, juice, or soup when you can drink without vomiting. Make sure you have little or no nausea before eating solid foods.  Only take over-the-counter or prescription medicines for pain, discomfort, or fever as directed by your health care provider.  Make sure you and your family fully understand everything about the medicines given to you, including what side effects may occur.  You should not drink alcohol, take sleeping pills, or take medicines that cause drowsiness  for at least 24 hours.  If you smoke, do not smoke without supervision.  If you are feeling better, you may resume normal activities 24 hours after you were sedated.  Keep all appointments with your health care provider. SEEK MEDICAL CARE IF:  Your skin is pale or bluish in color.  You continue to feel nauseous or vomit.  Your pain is getting worse  and is not helped by medicine.  You have bleeding or swelling.  You are still sleepy or feeling clumsy after 24 hours. SEEK IMMEDIATE MEDICAL CARE IF:  You develop a rash.  You have difficulty breathing.  You develop any type of allergic problem.  You have a fever. MAKE SURE YOU:  Understand these instructions.  Will watch your condition.  Will get help right away if you are not doing well or get worse. Document Released: 03/13/2013 Document Reviewed: 03/13/2013 [facility] Patient Information 2015 [place], [place]. This information is not intended to replace advice given to you by your health care provider. Make sure you discuss any questions you have with your health care provider.

## 2014-03-31 NOTE — Discharge Instructions (Signed)
Needle Biopsy °Care After °These instructions give you information on caring for yourself after your procedure. Your doctor may also give you more specific instructions. Call your doctor if you have any problems or questions after your procedure. °HOME CARE °· Rest for 4 hours after your biopsy, except for getting up to go to the bathroom or as told. °· Keep the places where the needles were put in clean and dry. °¨ Do not put powder or lotion on the sites. °¨ Do not shower until 24 hours after the test. Remove all bandages (dressings) before showering. °¨ Remove all bandages at least once every day. Gently clean the sites with soap and water. Keep putting a new bandage on until the skin is closed. °Finding out the results of your test °Ask your doctor when your test results will be ready. Make sure you follow up and get the test results. °GET HELP RIGHT AWAY IF:  °· You have shortness of breath or trouble breathing. °· You have pain or cramping in your belly (abdomen). °· You feel sick to your stomach (nauseous) or throw up (vomit). °· Any of the places where the needles were put in: °¨ Are puffy (swollen) or red. °¨ Are sore or hot to the touch. °¨ Are draining yellowish-white fluid (pus). °¨ Are bleeding after 10 minutes of pressing down on the site. Have someone keep pressing on any place that is bleeding until you see a doctor. °· You have any unusual pain that will not stop. °· You have a fever. °If you go to the emergency room, tell the nurse that you had a biopsy. Take this paper with you to show the nurse. °MAKE SURE YOU:  °· Understand these instructions. °· Will watch your condition. °· Will get help right away if you are not doing well or get worse. °Document Released: 05/05/2008 Document Revised: 08/15/2011 Document Reviewed: 05/05/2008 °[employer]® Patient Information ©2015 [employer]. This information is not intended to replace advice given to you by your health care provider. Make sure you discuss  any questions you have with your health care provider. °Conscious Sedation °Sedation is the use of medicines to promote relaxation and relieve discomfort and anxiety. Conscious sedation is a type of sedation. Under conscious sedation you are less alert than normal but are still able to respond to instructions or stimulation. Conscious sedation is used during short medical and dental procedures. It is milder than deep sedation or general anesthesia and allows you to return to your regular activities sooner.  °LET YOUR HEALTH CARE PROVIDER KNOW ABOUT:  °· Any allergies you have. °· All medicines you are taking, including vitamins, herbs, eye drops, creams, and over-the-counter medicines. °· Use of steroids (by mouth or creams). °· Previous problems you or members of your family have had with the use of anesthetics. °· Any blood disorders you have. °· Previous surgeries you have had. °· Medical conditions you have. °· Possibility of pregnancy, if this applies. °· Use of cigarettes, alcohol, or illegal drugs. °RISKS AND COMPLICATIONS °Generally, this is a safe procedure. However, as with any procedure, problems can occur. Possible problems include: °· Oversedation. °· Trouble breathing on your own. You may need to have a breathing tube until you are awake and breathing on your own. °· Allergic reaction to any of the medicines used for the procedure. °BEFORE THE PROCEDURE °· You may have blood tests done. These tests can help show how well your kidneys and liver are working. They can also   show how well your blood clots. °· A physical exam will be done.   °· Only take medicines as directed by your health care provider. You may need to stop taking medicines (such as blood thinners, aspirin, or nonsteroidal anti-inflammatory drugs) before the procedure.   °· Do not eat or drink at least 6 hours before the procedure or as directed by your health care provider. °· Arrange for a responsible adult, family member, or friend to  take you home after the procedure. He or she should stay with you for at least 24 hours after the procedure, until the medicine has worn off. °PROCEDURE  °· An intravenous (IV) catheter will be inserted into one of your veins. Medicine will be able to flow directly into your body through this catheter. You may be given medicine through this tube to help prevent pain and help you relax. °· The medical or dental procedure will be done. °AFTER THE PROCEDURE °· You will stay in a recovery area until the medicine has worn off. Your blood pressure and pulse will be checked.   °·  Depending on the procedure you had, you may be allowed to go home when you can tolerate liquids and your pain is under control. °Document Released: 02/15/2001 Document Revised: 05/28/2013 Document Reviewed: 01/28/2013 °[employer]® Patient Information ©2015 [employer]. This information is not intended to replace advice given to you by your health care provider. Make sure you discuss any questions you have with your health care provider. ° °

## 2014-03-31 NOTE — Procedures (Signed)
Procedure:  CT guided needle aspirate and core biopsy of right facial mass Findings:  Facial mas deep to parotid gland localized by CT.  Not clearly visible by ultrasound.  22 G FNA and 18 G core biopsy x 2 via 17 G needle.  No immediate complications.

## 2014-04-08 ENCOUNTER — Telehealth: Payer: Self-pay | Admitting: Internal Medicine

## 2014-04-08 NOTE — Telephone Encounter (Signed)
Received papers-it contained the pt's collaborative providers that the pt sees. Update the pt's chart within "Care Team" section. Will place in MR's look at's to review. Nothing further needed at this time.

## 2014-04-25 HISTORY — PX: ECTROPION REPAIR: SHX357

## 2014-04-25 HISTORY — PX: BROW LIFT: SHX178

## 2014-04-25 HISTORY — PX: NECK DISSECTION: SUR422

## 2014-04-25 HISTORY — PX: PAROTIDECTOMY: SUR1003

## 2014-04-25 HISTORY — PX: OTHER SURGICAL HISTORY: SHX169

## 2014-04-30 DIAGNOSIS — G519 Disorder of facial nerve, unspecified: Secondary | ICD-10-CM | POA: Insufficient documentation

## 2014-04-30 DIAGNOSIS — C770 Secondary and unspecified malignant neoplasm of lymph nodes of head, face and neck: Secondary | ICD-10-CM

## 2014-04-30 HISTORY — DX: Secondary and unspecified malignant neoplasm of lymph nodes of head, face and neck: C77.0

## 2014-05-05 ENCOUNTER — Non-Acute Institutional Stay (SKILLED_NURSING_FACILITY): Payer: Medicare Other | Admitting: Internal Medicine

## 2014-05-05 DIAGNOSIS — I10 Essential (primary) hypertension: Secondary | ICD-10-CM | POA: Diagnosis not present

## 2014-05-05 DIAGNOSIS — C770 Secondary and unspecified malignant neoplasm of lymph nodes of head, face and neck: Secondary | ICD-10-CM

## 2014-05-05 DIAGNOSIS — J849 Interstitial pulmonary disease, unspecified: Secondary | ICD-10-CM

## 2014-05-05 DIAGNOSIS — R531 Weakness: Secondary | ICD-10-CM | POA: Diagnosis not present

## 2014-05-05 DIAGNOSIS — C089 Malignant neoplasm of major salivary gland, unspecified: Secondary | ICD-10-CM | POA: Diagnosis not present

## 2014-05-05 DIAGNOSIS — R1310 Dysphagia, unspecified: Secondary | ICD-10-CM | POA: Diagnosis not present

## 2014-05-05 DIAGNOSIS — R0600 Dyspnea, unspecified: Secondary | ICD-10-CM

## 2014-05-05 DIAGNOSIS — R634 Abnormal weight loss: Secondary | ICD-10-CM

## 2014-05-05 DIAGNOSIS — E039 Hypothyroidism, unspecified: Secondary | ICD-10-CM | POA: Diagnosis not present

## 2014-05-05 HISTORY — DX: Interstitial pulmonary disease, unspecified: J84.9

## 2014-05-07 ENCOUNTER — Encounter (HOSPITAL_BASED_OUTPATIENT_CLINIC_OR_DEPARTMENT_OTHER): Payer: Medicare Other

## 2014-05-09 ENCOUNTER — Ambulatory Visit: Payer: Medicare Other | Admitting: Cardiology

## 2014-05-15 ENCOUNTER — Encounter (HOSPITAL_COMMUNITY): Payer: Self-pay | Admitting: Internal Medicine

## 2014-05-20 NOTE — Progress Notes (Addendum)
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   on 01/10/14, prior to being seen by Dr. [name]. His eyelids were sensitive and heavy and hard to close with burning and tearing.  He also reported difficulty opening his mouth and reported difficulty eating.  Also experienced right ear pain and swelling in front of of the right ear. Hearing was "fine".  Had sensitivity in his cheek.  Patient has Chronic Lymphyocytic Leukemia  Biopsies of (if applicable) revealed: 15/94/58  Excision, Right partial Auriculectomy, Right Total Parotidectomy with dissection and Sacrifice of the Facial Nerve, Selective Neck dissection, Mastoidectomy with Dissection and sacrifice of the Facial nerve.  Mid-forehead Brow Lift/Tarsal strip Ectropion Repair/Static Sling  Tumor Site: Posterior Auricular  Invasive Squamous Cell Carcinoma   Nutrition Status Yes No Comments  Weight changes? []  [x]    Swallowing concerns? []  [x]  Has difficulty chewing  PEG? []  []     Referrals Yes No Comments  Social Work? []  []    Dentistry? []  []    Swallowing therapy? []  []    Nutrition? []  []    Med/Onc? [x]  []  [facility]   Safety Issues Yes No Comments  Prior radiation? []  [x]    Pacemaker/ICD? []  [x]    Possible current pregnancy? []  []    Is the patient on methotrexate? []  []     Tobacco/Marijuana/Snuff/ETOH use: Never Smoker, No Drinking, No Alcohol  Past/Anticipated interventions by otolaryngology, if any: See Above  Past/Anticipated interventions by medical oncology, if any:      Current Complaints / other details:    Leukemia - he states he die not receive treatment

## 2014-05-21 ENCOUNTER — Encounter: Payer: Self-pay | Admitting: Internal Medicine

## 2014-05-21 ENCOUNTER — Encounter: Payer: Self-pay | Admitting: *Deleted

## 2014-05-21 ENCOUNTER — Ambulatory Visit
Admission: RE | Admit: 2014-05-21 | Discharge: 2014-05-21 | Disposition: A | Discharge status: Home / Self Care | Payer: Medicare Other | Source: Ambulatory Visit | Attending: Radiation Oncology | Admitting: Radiation Oncology

## 2014-05-21 ENCOUNTER — Encounter: Payer: Self-pay | Admitting: Radiation Oncology

## 2014-05-21 VITALS — BP 122/64 | HR 69 | Temp 97.6°F | Ht 70.0 in | Wt 185.0 lb

## 2014-05-21 DIAGNOSIS — C7989 Secondary malignant neoplasm of other specified sites: Secondary | ICD-10-CM | POA: Insufficient documentation

## 2014-05-21 DIAGNOSIS — Z7982 Long term (current) use of aspirin: Secondary | ICD-10-CM | POA: Insufficient documentation

## 2014-05-21 DIAGNOSIS — Z791 Long term (current) use of non-steroidal anti-inflammatories (NSAID): Secondary | ICD-10-CM | POA: Insufficient documentation

## 2014-05-21 DIAGNOSIS — Z79891 Long term (current) use of opiate analgesic: Secondary | ICD-10-CM | POA: Insufficient documentation

## 2014-05-21 DIAGNOSIS — Z9049 Acquired absence of other specified parts of digestive tract: Secondary | ICD-10-CM | POA: Insufficient documentation

## 2014-05-21 DIAGNOSIS — E785 Hyperlipidemia, unspecified: Secondary | ICD-10-CM | POA: Insufficient documentation

## 2014-05-21 DIAGNOSIS — T66XXXS Radiation sickness, unspecified, sequela: Secondary | ICD-10-CM | POA: Insufficient documentation

## 2014-05-21 DIAGNOSIS — Z7902 Long term (current) use of antithrombotics/antiplatelets: Secondary | ICD-10-CM | POA: Insufficient documentation

## 2014-05-21 DIAGNOSIS — K1233 Oral mucositis (ulcerative) due to radiation: Secondary | ICD-10-CM | POA: Insufficient documentation

## 2014-05-21 DIAGNOSIS — Z85828 Personal history of other malignant neoplasm of skin: Secondary | ICD-10-CM | POA: Insufficient documentation

## 2014-05-21 DIAGNOSIS — K219 Gastro-esophageal reflux disease without esophagitis: Secondary | ICD-10-CM | POA: Insufficient documentation

## 2014-05-21 DIAGNOSIS — L599 Disorder of the skin and subcutaneous tissue related to radiation, unspecified: Secondary | ICD-10-CM | POA: Insufficient documentation

## 2014-05-21 DIAGNOSIS — R634 Abnormal weight loss: Secondary | ICD-10-CM | POA: Insufficient documentation

## 2014-05-21 DIAGNOSIS — Z66 Do not resuscitate: Secondary | ICD-10-CM | POA: Insufficient documentation

## 2014-05-21 DIAGNOSIS — Z6826 Body mass index (BMI) 26.0-26.9, adult: Secondary | ICD-10-CM | POA: Insufficient documentation

## 2014-05-21 DIAGNOSIS — K117 Disturbances of salivary secretion: Secondary | ICD-10-CM | POA: Insufficient documentation

## 2014-05-21 DIAGNOSIS — E039 Hypothyroidism, unspecified: Secondary | ICD-10-CM | POA: Insufficient documentation

## 2014-05-21 DIAGNOSIS — Z510 Encounter for antineoplastic radiation therapy: Secondary | ICD-10-CM | POA: Insufficient documentation

## 2014-05-21 DIAGNOSIS — R070 Pain in throat: Secondary | ICD-10-CM | POA: Insufficient documentation

## 2014-05-21 DIAGNOSIS — C4442 Squamous cell carcinoma of skin of scalp and neck: Secondary | ICD-10-CM | POA: Insufficient documentation

## 2014-05-21 DIAGNOSIS — R5383 Other fatigue: Secondary | ICD-10-CM | POA: Insufficient documentation

## 2014-05-21 DIAGNOSIS — K5900 Constipation, unspecified: Secondary | ICD-10-CM | POA: Insufficient documentation

## 2014-05-21 DIAGNOSIS — I951 Orthostatic hypotension: Secondary | ICD-10-CM | POA: Insufficient documentation

## 2014-05-21 DIAGNOSIS — Z952 Presence of prosthetic heart valve: Secondary | ICD-10-CM | POA: Insufficient documentation

## 2014-05-21 DIAGNOSIS — Z79899 Other long term (current) drug therapy: Secondary | ICD-10-CM | POA: Insufficient documentation

## 2014-05-21 DIAGNOSIS — G510 Bell's palsy: Secondary | ICD-10-CM | POA: Insufficient documentation

## 2014-05-21 DIAGNOSIS — I10 Essential (primary) hypertension: Secondary | ICD-10-CM | POA: Insufficient documentation

## 2014-05-21 DIAGNOSIS — R1310 Dysphagia, unspecified: Secondary | ICD-10-CM | POA: Insufficient documentation

## 2014-05-21 DIAGNOSIS — Z8673 Personal history of transient ischemic attack (TIA), and cerebral infarction without residual deficits: Secondary | ICD-10-CM | POA: Insufficient documentation

## 2014-05-21 DIAGNOSIS — C4440 Unspecified malignant neoplasm of skin of scalp and neck: Secondary | ICD-10-CM | POA: Insufficient documentation

## 2014-05-21 NOTE — Progress Notes (Signed)
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 Skin cancer of scalp or skin of neck 173.41 C44.41     HISTORY OF PRESENT ILLNESS::Adrian West is a 80 y.o. 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 He reports "routine" skin cancer excisions over the years of the scalp as well.  He developed gradual right facial nerve paralysis over the summer 2015, originally thought to be Bell's Palsy per history and MRI on 01-15-14 w/ contrast. No mass seen at that time, but right facial nerve was enhancing. MRI in 03/07/14 then showed progressive change suspicious for right parotid tumor and right facial nerve involvement.  He was referred to [facility] West (Adrian. Nicolette West) .    04/21/14 PET at [facility] was notable for  Right parotid gland malignancy with lymphadenopathy deep to parotid, concerning for regional nodal disease.   Nonpathologically enlarged bilateral cervical nodes, non-malignant per PET criteria.  No PET evidence of distant metastases.   He underwent the following at [facility] on 04/25/14:  right partial auriculectomy   right total parotidectomy with dissection and sacrifice of the facial nerve   right mastoidectomy with dissection and sacrifice of the facial nerve  right selective neck dissection   right direct mid-forehead brow lift   right tarsal strip ectropion repair   right static sling using AlloDerm graft for elevation of the nasolabial fold  right lateral lip and  closure of the pre-existing parotidectomy using a flap   A 2.3 cm squamous cell carcinoma was resected which invades into the right parotid gland in the main specimen and extends to involve the deep soft tissue margin of resection.  Extensive perineural invasion by squamous cell carcinoma was noted, including distal facial nerve margin positive for carcinoma at the stylomastoid foramen. No LVSI. 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    Denies tobacco or ETOH abuse. 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 Anticipates gold weight to be placed next week in right eyelid by Adrian West.  PREVIOUS RADIATION THERAPY: No  PAST MEDICAL HISTORY:  has a past medical history of Thyroid disease; Hypertension; GERD (gastroesophageal reflux disease); Benign neoplasm of colon; Tremor, essential; Diverticulosis; TIA (transient ischemic attack) (May 2012); Skin cancer; Neuropathy, peripheral; Lung abnormality (11/25/2008); Hyperlipidemia; MGUS (monoclonal gammopathy of unknown significance) (04/11/2013); Diaphragm paralysis; Esophageal stricture; Hiatal hernia; and Bell's palsy.    PAST SURGICAL HISTORY: Past Surgical History  Procedure Laterality Date  . Appendectomy    . Cataract extraction      Adrian [facility]  . Cholecystectomy  1998    Adrian Adrian West  . Inguinal hernia repair    . Lumbar laminectomy    . Nasal sinus surgery    . Tonsillectomy    . Esophageal dilation       X3;Adrian Adrian West  . 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 Laparoscopic ablation renal mass  08/14/2009  . Mohs surgery  2012     ear  . Tilt table study N/A 05/06/2013    Procedure: TILT TABLE STUDY;  Surgeon: Adrian Sprang, MD;  Location: [facility] CATH LAB;  Service:  Cardiovascular;  Laterality: N/A;  . Right heart catheterization N/A 03/03/2014    Procedure: RIGHT HEART CATH;  Surgeon: [name], MD;  Location: [facility] CATH LAB;  Service: Cardiovascular;  Laterality: N/A;    FAMILY HISTORY: family history includes Colon cancer (age of onset: 39) in his mother; Healthy in his sister; Heart attack (age of onset: 34) in his father; Heart attack (age of onset: 61) in his maternal grandmother; Uterine cancer in his paternal grandmother. There is no history of Stroke or Diabetes.  SOCIAL HISTORY:  reports that he has never smoked. He has never used smokeless tobacco. He reports that he does not drink alcohol or use illicit drugs.  ALLERGIES: Review of patient's allergies indicates no known allergies.  MEDICATIONS:  Current Outpatient Prescriptions  Medication Sig Dispense Refill  . acetaminophen (TYLENOL) 500 MG tablet Take 500 mg by mouth every 6 (six) hours as needed (As needed for pain).    Marland Kitchen aspirin EC 81 MG tablet Take 81 mg by mouth daily.    Marland Kitchen atorvastatin (LIPITOR) 10 MG tablet Take 10 mg by mouth daily.    . bisacodyl (DULCOLAX) 5 MG EC tablet Take 5 mg by mouth daily as needed for moderate constipation. 10 mg po orally, as needed    . erythromycin ophthalmic ointment 1 application 2 (two) times daily. Place a thin strip into the right eye twice daily    . HYDROcodone-acetaminophen (NORCO/VICODIN) 5-325 MG per tablet Take 2 tablets by mouth every 6 (six) hours as needed for moderate pain.    . Hypromellose (GENTEAL MILD OP) Apply 1 application to eye as needed (both eyes).    Marland Kitchen ibuprofen (ADVIL,MOTRIN) 600 MG tablet Take 600 mg by mouth 3 (three) times daily.    Marland Kitchen levothyroxine (SYNTHROID, LEVOTHROID) 150 MCG tablet Take 150 mcg by mouth daily before breakfast.    . metoprolol succinate (TOPROL-XL) 25 MG 24 hr tablet Take 25 mg by mouth daily.    . Multiple Vitamin (MULTIVITAMIN WITH MINERALS) TABS tablet Take 1 tablet by mouth daily.    Marland Kitchen omeprazole  (PRILOSEC) 40 MG capsule Take 40 mg by mouth daily.    . polyvinyl alcohol (LIQUIFILM TEARS) 1.4 % ophthalmic solution 2 drops at bedtime.    . tamsulosin (FLOMAX) 0.4 MG CAPS capsule Take 0.4 mg by mouth.    . clopidogrel (PLAVIX) 75 MG tablet Take 75 mg by mouth daily.    . traMADol (ULTRAM) 50 MG tablet Take 50 mg by mouth every 6 (six) hours as needed for moderate pain.      No current facility-administered medications for this encounter.    REVIEW OF SYSTEMS:  Notable for that above.   PHYSICAL EXAM:  height is 5\' 10"  (1.778 m) and weight is 185 lb (83.915 kg). His temperature is 97.6 F (36.4 C). His blood pressure is 122/64 and his pulse is 69. His oxygen saturation is 99%.   General: Alert and oriented, in no acute distress HEENT: Head is normocephalic  Oropharynx is clear. Dentition - metal work.  Right facial paralysis and numbness.  Right pupillary reflex less profound than left.  Right eye is watery, and minimal blinking  reflex on right Neck: healing well. Neck is supple, no palpable cervical or supraclavicular lymphadenopathy. Heart: Regular in rate and rhythm with no murmurs, rubs, or gallops. Chest: Clear to auscultation bilaterally, with no rhonchi, wheezes, or rales. Abdomen: Soft, nontender, nondistended, with no rigidity or guarding. Extremities: No cyanosis or edema. Lymphatics: see Neck Exam Skin: multiple lesions c/w sun exposure Musculoskeletal: symmetric strength and muscle tone throughout. Neurologic:hard of hearing. Left hearing aid.  Speech is fluent. Coordination is intact. Right facial paralysis and numbness.   Psychiatric: Judgment and insight are intact. Affect is appropriate.   ECOG = 2  0 - Asymptomatic (Fully active, able to carry on all predisease activities without restriction)  1 - Symptomatic but completely ambulatory (Restricted in physically strenuous activity but ambulatory and able to carry out work of a light or sedentary nature. For example,  light housework, office work)  2 - Symptomatic, <50% in bed during the day (Ambulatory and capable of all self care but unable to carry out any work activities. Up and about more than 50% of waking hours)  3 - Symptomatic, >50% in bed, but not bedbound (Capable of only limited self-care, confined to bed or chair 50% or more of waking hours)  4 - Bedbound (Completely disabled. Cannot carry on any self-care. Totally confined to bed or chair)  5 - Death   Eustace Pen MM, Creech RH, Tormey DC, et al. [phone]). "Toxicity and response criteria of the [facility] [employer]". [place] Oncol. 5 (6): 649-55   LABORATORY DATA:  Lab Results  Component Value Date   WBC 7.2 03/31/2014   HGB 15.0 03/31/2014   HCT 44.9 03/31/2014   MCV 93.7 03/31/2014   PLT 326 03/31/2014   CMP     Component Value Date/Time   NA 139 03/03/2014 1020   K 4.5 03/03/2014 1020   CL 103 03/03/2014 1020   CO2 27 03/03/2014 1020   GLUCOSE 98 03/03/2014 1020   BUN 14 03/03/2014 1020   CREATININE 0.90 03/03/2014 1020   CREATININE 1.20 10/12/2010 1257   CALCIUM 8.7 03/03/2014 1020   PROT 5.6* 10/22/2013 1218   ALBUMIN 3.0* 10/22/2013 1218   AST 21 10/22/2013 1218   ALT 23 10/22/2013 1218   ALKPHOS 60 10/22/2013 1218   BILITOT 0.5 10/22/2013 1218   GFRNONAA 76* 03/03/2014 1020   GFRAA 88* 03/03/2014 1020   Lab Results  Component Value Date   TSH 4.59* 02/13/2014    PATHOLOGY 04/25/2014:  FINAL PATHOLOGIC DIAGNOSIS MICROSCOPIC EXAMINATION AND DIAGNOSIS  A.  SKIN, POST AURICULAR, BIOPSY:       Invasive squamous cell carcinoma  B.  FACIAL NERVE, DISTAL UPPER DIVISION, BIOPSY:      Perineural invasion by squamous cell carcinoma  C.  FACIAL NERVE, DISTAL LOWER DIVISION, BIOPSY:      Nerve and fibrous tissue      No malignancy identified  D.  FACIAL NERVE, DISTAL ZYGOMATIC BRANCH, BIOPSY:      Nerve and fibroadipose tissue      No malignancy identified  E.  FACIAL NERVE, DISTAL TEMPORAL  BRANCH, BIOPSY:      Nerve and fibroadipose tissue      No malignancy identified       F.  NECK CONTENTS, RIGHT, LEVEL 2B, REGIONAL DISSECTION:      Involvement by small lymphocytic lymphoma      No carcinoma identified in twenty-two lymph nodes (0/22)       G.  FACIAL NERVE, UPPER DIVISION,  RESECTION:      Nerve and fibroadipose tissue       No malignancy identified  H.  NECK CONTENTS, RIGHT, LEVELS 2A AND 3, REGIONAL DISSECTION:      Involvement by small lymphocytic lymphoma      No carcinoma identified in sixteen lymph nodes (0/16)      Benign salivary gland with chronic inflammation       I.   INFERIOR AURICLE, POST AURICULAR SKIN CANCER AND PAROTID, RIGHT, PARTIAL     AURICULECTOMY AND TOTAL PAROTIDECTOMY:       Invasive squamous cell carcinoma, measuring 2.3 cm in greatest extent      Extensive perineural invasion present      Deep margin positive for carcinoma      No definitive lymphovascular invasion identified      See cancer protocol and comment  J.   FACIAL NERVE, VERTICAL, BIOPSY:      Perineural invasion by squamous cell carcinoma  K.   FACIAL NERVE, HORIZONTAL, BIOPSY:      Benign nerve with no malignancy identified  L.   STYLOMASTOID FORAMEN, RESECTION:      Perineural invasion by squamous cell carcinoma      Distal facial nerve margin positive for carcinoma      No bone invasion by carcinoma identified  COMMENT: Squamous cell carcinoma invades into the right parotid gland in the main specimen (part I) and extends to involve the deep soft tissue margin of resection. Correlation with the surgical procedure is recommended for final facial nerve margin status.  The lymph nodes in the right neck dissection demonstrate altered lymph node architecture by monotonous-appearing small lymphocytes. By immunohistochemistry on representative lymph nodes (block F2), CD20 stains lymphoid cells in the effaced areas and CD3 stains background T cells. These B cells are  positive for CD5, CD23, and bcl2, and are negative for cyclin D1, CD10, and CD43. This immunophenotype is consistent with small lymphocytic lymphoma.  The findings were discussed with Adrian. Nicolette West on 05/05/14 at 1:15pm by S. O'Neill.  The positive immunohistochemical controls worked appropriately.  "These tests were developed and their performance characteristics determined by [facility].  They have not been cleared or approved by the U.S. Food and Drug Administration.  The FDA has determined that such clearance or approval is not necessary.  These tests are used for clinical purposes.  They should not be regarded as investigational or for research.  This laboratory is certified under the [place] (CLIA) as qualified to perform high complexity clinical laboratory testing."              SQUAMOUS CELL CARCINOMA OF THE SKIN: BIOPSY, EXCISION, RE-EXCISION  - CANCER PROTOCOL  PROCEDURE:      Excision,  Right partial auriculectomy and total parotidectomy TUMOR SITE:      Posterior auricular TUMOR SIZE:      Greatest dimension:  2.3 cm      Additional dimensions:  2.0 x 0.8 cm HISTOLOGIC TYPE:      Squamous cell carcinoma (SCC) HISTOLOGIC GRADE:      G2 MAXIMUM TUMOR THICKNESS:      2.3 cm ANATOMIC LEVEL:      V (carcinoma invades subcutaneum) MARGINS:      Peripheral margins uninvolved by invasive carcinoma         Distance of invasive carcinoma from closest lateral margin:  < 0.1 cm      Deep margin involved by  invasive carcinoma LYMPH-VASCULAR INVASION:      Not identified PERINEURAL INVASION:      Present LYMPH NODES:      Number of nodes identified:  38      Number of nodes involved by metastatic carcinoma: 0 Pathologic Staging (pTNM) PRIMARY TUMOR (pT):      pT2 REGIONAL LYMPH NODES (pN):      pN0:  No regional lymph node metastasis DISTANT METASTASIS (pM):       pMX --------------------------------------------------------      Report Prepared By:  Lyndon Code, M.D., Resident-Pathology  I have personally reviewed the slides and/or other related materials referenced, and have edited the report as part of my pathologic assessment and final interpretation.  Electronically Signed Out By:   Benay Pillow, MD Gilbert West 05/05/2014 13:27:03  sso/pm  Specimen(s) Received A:  Post auricular skin biopsy  B:  Distal upper division of facial nerve  C:  Distal lower division of facial nerve  D:  Distal zygomatic branch and facial nerve  E:  Distal temporal branch, facial nerve  F:  Right level2B neck contents  G:  Resection of upper division  facial there  H:  Right level2A+3 neck contents  I:  Right inferior auricle, post auricular skin cancer and parotid  J:  Vertical facial nerve  K:  Horizontal facial nerve  L:   stylomastoid foramen   Clinical History Secondary malignant neoplasm of parotid lymph nodes. Per Maryann Alar, patient with history of Mohs surgery for squamous cell carcinoma in the postauricular skin presenting with right facial weakness and presumed right parotid cancer.  Intraoperative Procedure FSA1" POST AURICULAR SKIN BIOPSY": INVASIVE SQUAMOUS CELL CARCINOMA. FROZEN SECTION DIAGNOSIS WAS RENDERED BY Adrian. Rico Ala ON April 25, 2014.  FSB1" DISTAL UPPER DIVISION OF FACIAL NERVE": POSITIVE FOR PERINEURAL INVASION BY SQUAMOUS CELL CARCINOMA. FROZEN SECTION DIAGNOSIS WAS RENDERED BY Adrian. Rico Ala ON April 25, 2014.  FSC1" DISTAL LOWER DIVISION OF FACIAL NERVE": NEGATIVE FOR MALIGNANCY. FROZEN SECTION DIAGNOSIS WAS RENDERED BY Adrian. Rico Ala ON April 25, 2014.  FSD1" DISTAL ZYGOMATIC BRANCH AND FACIAL NERVE": NEGATIVE FOR MALIGNANCY. FROZEN SECTION DIAGNOSIS WAS RENDERED BY Adrian. Rico Ala ON April 25, 2014.  FSE1" DISTAL TEMPORAL BRANCH AND FACIAL NERVE": NEGATIVE FOR MALIGNANCY. FROZEN SECTION DIAGNOSIS WAS  RENDERED BY Adrian. Rico Ala ON April 25, 2014.  FSJ1" VERTICAL FACIAL NERVE": POSITIVE FOR CARCINOMA. FROZEN SECTION DIAGNOSIS WAS RENDERED BY Adrian. Rico Ala ON April 25, 2014.  FSK1" HORIZONTAL FACIAL NERVE": NEGATIVE FOR CARCINOMA. FROZEN SECTION DIAGNOSIS WAS RENDERED BY Adrian. Claybon Jabs ON April 25, 2014.     K. Curly Shores, M.D., Pathology  Jerline Pain, M.D., Pathology  Gross Description A. Received labeled "post auricular skin biopsy" are two skin punch biopsies (0.3 cm in diameter each and 0.8 and 0.5 cm in depth). One of the punches is submitted for frozen section diagnosis and the remnant submitted in FSA1 and other punch submitted in A2.  B. Received labeled "distal upper division of facial nerve" is 0.5 x 0.3 x 0.3 cm tan fragment of soft tissue. The specimen entirely submitted for frozen section diagnosis in FS B1.  C. Received labeled "distal lower division of facial nerve" is 0.7 x 0.3 x 0.2 cm tan fragment of soft tissue. The specimen      entirely submitted for frozen section diagnosis in FSC1.  D. Received labeled "distal zygomatic branch and facial nerve" is 0.3 x 0.2 x 0.2 cm tan fragment of soft tissue. The specimen entirely submitted for frozen section diagnosis In  FSD1.  E. Received labeled "distal temporal branch facial nerve" is a 0.5 x 0.5 x 0.2 cm fragment of tan tissue. The specimen entirely submitted for frozen section diagnosis in FSE1.  F. Received labeled "right level 2B neck contents" is one fragment of yellow-tan soft tissue measuring 4.7 x 3.3 x 1 cm. Sectioning reveals multiple lymph node candidates ranging from 0.2- 0.8 cm. The specimen is entirely submitted as follows:  BLOCK SUMMARY: F1-3          5 lymph node candidates each F4-6          the remainder of the specimen  G. Received labeled "resection of upper division of facial nerve" is one fragment of tan tissue measuring 1.2 x 0.8 x 0.5 cm. The specimen is entirely  submitted in G1.  H. Received labeled "right level2A+3 neck contents" are 3 fragments of yellow-tan soft tissue measuring 5.6 x 4.4 x 1.5 cm in aggregate. Sectioning reveals multiple lymph node candidates ranging from 0.2-1.9 cm in greatest dimension, as well as grossly unremarkable salivary gland. The specimen is entirely submitted as follows:  BLOCK SUMMARY: H1          4 lymph node candidates H2          4 lymph node candidates H3          4lymph node candidates H4          one lymph node candidate, sectioned H5-7          remainder of the specimen for lymph node identification   I. Received labeled "right inferior auricle, posterior auricular skin cancer and parotid" is a 6.5 x 5.5 x 3 cm fragment of tissue designated by stitch as main trunk of facial nerve consisting of auricle (3 x 2.5 x 1.3) with attached fragment of skin (3.5 x 2.7cm ) and parotid (5 x 4.8 x 2 cm). Located in the skin is a 2.0 x 0.8 cm ulcerated lesion extending to a depth of 2.3 cm, located 0.5 cm from superior skin margin,1.1 cm from anterior skin margin, 1.3 cm from lateral skin margin, and grossly abutting the deep margin. The specimen is inked superficial blue and deep black. Representative sections of the specimen are submitted as follows:  BLOCK SUMMARY: I1          main trunk of facial nerve margin, en face I2          superior auricular margin, en face I3          inferior auricular margin, en face I4          medial auricular margin, en face I5          tumor with superior skin margin I6-7          tumor with superior skin margin, and deep margin (on cross section; orange not true margin) I8          tumor with inferior skin margin and parotid I9-10          tumor with inferior skin margin, parotid, superficial and deep margin (on cross section) I11          tumor with lateral skin margin, and deep margin            J. Received labeled "vertical facial nerve" is a 0.3 x 0.3 x 0.2 cm red  soft tissue fragment. The specimen is entirely submitted for frozen section diagnosis in FSJ1.  K. Received labeled "horizontal facial nerve" are 3  fragments of tan tissue measuring 0.3 x 0.1 x 0.1 cm in aggregate. The specimen is entirely submitted for frozen section diagnosis in FSK1.  L. Received labeled "stylomastoid foramen" is 2.1 x 2 x 0.8 cm fragment of tan tissue designated by a suture as distal facial nerve and consisting of one 1.4 cm fragment of nerve and 1.4 x 1.2 x 0.4 cm fragment of bone. Distal facial nerve inked black, proximal facial nerve inked orange, the rest of the specimen inked blue and the specimen serially sectioned from distal facial nerve to proximal. 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   I think he is an excellent candidate for adjuvant radiotherapy for locoregional control. Plan is as below:  1) Refer to dentistry for examination/ trismus evaluation/ scatter guards/ hygiene -- extractions to be avoided to allow expediting RT. I should be able to keep tooth roots <50Gy  2) Will refer to nutrition for nutrition support  3) Simulation once released by dentistry.  I should be able to start RT by mid January. Anticipate total dose of 66 Gy in 33 fractions of RT to right face/neck/base of skull  4) Assign to tumor board    5) Baseline hypothyroidism.  Multiple co morbidities as above. No current therapy for CLL.  It was a pleasure meeting the  patient today. We discussed the risks, benefits, and side effects of radiotherapy. We talked in detail about acute and late effects. He understands that some of the most bothersome acute effects will be significant soreness of the mouth and throat, changes in taste, changes in salivary function, skin irritation, hair loss, dehydration, weight loss and fatigue. We talked about late effects which include but are not necessarily limited to trismus, nerve injury or rare brain injury, worsening dry mouth, dental issues. No guarantees of treatment were given. A consent form was signed and placed in the patient's medical record. The patient is enthusiastic about proceeding with treatment. I look forward to participating in the patient's care.    __________________________________________   [name], MD

## 2014-05-23 NOTE — Progress Notes (Addendum)
Met with patient, his lady friend, and son during initial consult with Dr. Isidore Moos. 1. Introduced myself as their Navigator, explained my role as a member of the Care Team, provided contact information, encouraged them to contact me with questions/concerns as treatments/procedures begin. 2. Provided New Patient Information packet:  Contact information for physician and navigator  Fall Prevention Patient Safety Plan  [facility] campus map with highlight of [place] 3. Provided supportive explanation of radiation treatment including SIM planning and fitting of head mask, showed them example of mask.   4. Provided tour of SIM and Tomo, explained procedures, arrival procedures. They verbalized understanding of information provided.    Gayleen Orem, RN, BSN, [place] [facility] [place] [phone]

## 2014-05-26 ENCOUNTER — Encounter (HOSPITAL_COMMUNITY): Payer: Self-pay | Admitting: Dentistry

## 2014-05-26 ENCOUNTER — Ambulatory Visit: Payer: Medicare Other | Admitting: Nutrition

## 2014-05-26 ENCOUNTER — Encounter: Payer: Self-pay | Admitting: Radiation Oncology

## 2014-05-26 ENCOUNTER — Ambulatory Visit (HOSPITAL_COMMUNITY): Payer: Self-pay | Admitting: Dentistry

## 2014-05-26 VITALS — BP 100/72 | HR 62 | Temp 97.4°F

## 2014-05-26 VITALS — BP 115/54 | HR 61

## 2014-05-26 DIAGNOSIS — C07 Malignant neoplasm of parotid gland: Secondary | ICD-10-CM

## 2014-05-26 DIAGNOSIS — K0530 Chronic periodontitis, unspecified: Secondary | ICD-10-CM

## 2014-05-26 DIAGNOSIS — Z01818 Encounter for other preprocedural examination: Secondary | ICD-10-CM

## 2014-05-26 DIAGNOSIS — Z0189 Encounter for other specified special examinations: Secondary | ICD-10-CM

## 2014-05-26 DIAGNOSIS — C4440 Unspecified malignant neoplasm of skin of scalp and neck: Secondary | ICD-10-CM

## 2014-05-26 DIAGNOSIS — K08409 Partial loss of teeth, unspecified cause, unspecified class: Secondary | ICD-10-CM

## 2014-05-26 DIAGNOSIS — Z463 Encounter for fitting and adjustment of dental prosthetic device: Secondary | ICD-10-CM

## 2014-05-26 DIAGNOSIS — Z9189 Other specified personal risk factors, not elsewhere classified: Secondary | ICD-10-CM

## 2014-05-26 DIAGNOSIS — K030 Excessive attrition of teeth: Secondary | ICD-10-CM

## 2014-05-26 DIAGNOSIS — K011 Impacted teeth: Secondary | ICD-10-CM

## 2014-05-26 DIAGNOSIS — C4441 Basal cell carcinoma of skin of scalp and neck: Secondary | ICD-10-CM

## 2014-05-26 DIAGNOSIS — K036 Deposits [accretions] on teeth: Secondary | ICD-10-CM

## 2014-05-26 DIAGNOSIS — IMO0002 Reserved for concepts with insufficient information to code with codable children: Secondary | ICD-10-CM

## 2014-05-26 MED ORDER — SODIUM FLUORIDE 1.1 % DT GEL: DENTAL | Status: DC

## 2014-05-26 NOTE — Progress Notes (Signed)
DENTAL CONSULTATION  Date of Consultation:  05/26/2014 Patient Name:   Adrian West Date of Birth:   June 20, 1927 Medical Record Number: [id number]  VITALS: BP 100/72 mmHg  Pulse 62  Temp(Src) 97.4 F (36.3 C) (Oral)  CHIEF COMPLAINT: Patient was referred by Dr. Isidore Moos for a preradiation therapy dental protocol examination.  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 Patient underwent a surgical resection involving right total parotidectomy, neck dissection, and reconstructive surgery with Dr. [name] on 04/25/2014 at [facility]. Patient with anticipated postoperative radiation therapy with Dr. Isidore Moos. Patient is now seen as part of a medically necessary preradiation therapy dental protocol examination.  The patient denies acute toothaches, swellings, or abscesses. Patient was last seen for a cleaning by his primary dentist in September 2015. Patient alternates between his periodontist ( Dr. Satira Sark) and his general dentist (Dr. Ennis Forts)  for his periodontal care.  Patient denies having any unmet dental needs at this time.  PROBLEM LIST: Patient Active Problem List   Diagnosis Date Noted  . Skin cancer of scalp or skin of neck 05/21/2014  . 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 Dyspnea and respiratory abnormality 03/10/2014  . Left upper lobe pneumonia 02/20/2014  . 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 Polycythemia, secondary 01/10/2014  . 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 MGUS (monoclonal gammopathy of unknown significance) 04/11/2013  . Overweight (BMI 25.0-29.9) 03/20/2012  . Other and unspecified hyperlipidemia 02/24/2012  . PVC's (premature ventricular contractions) 04/05/2011  . TIA (transient ischemic attack) 10/12/2010  . SPINAL STENOSIS, LUMBAR 07/28/2010  . DYSPNEA/SHORTNESS OF BREATH 02/11/2010  . Memory loss 12/31/2009   . HYPOTHYROIDISM 02/14/2008  . MITRAL REGURGITATION 02/14/2008  . HYPERTENSION 02/14/2008  . GERD 02/14/2008    PMH: Past Medical History  Diagnosis Date  . Thyroid disease   . Hypertension   . GERD (gastroesophageal reflux disease)     PMH of stricture , recurrent;[name]  . Benign neoplasm of colon   . Tremor, essential   . Diverticulosis   . TIA (transient ischemic attack) May 2012  . Skin cancer   . Neuropathy, peripheral   . Lung abnormality 11/25/2008    "nerve damage from heart surgery"  . Hyperlipidemia   . MGUS (monoclonal gammopathy of unknown significance) 04/11/2013  . Diaphragm paralysis   . Esophageal stricture   . Hiatal hernia   . Bell's palsy   . Vertigo 2005  . Gait disturbance     PSH: Past Surgical History  Procedure Laterality Date  . Appendectomy    . Cataract extraction      [name]  . Cholecystectomy  1998    [name]  . Inguinal hernia repair    . Lumbar laminectomy    . Nasal sinus surgery    . Tonsillectomy    . Esophageal dilation       X3;[name]  . 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 Laparoscopic ablation renal mass  08/14/2009  . Mohs surgery  2012     ear  . Tilt table study N/A 05/06/2013    Procedure: TILT TABLE STUDY;  Surgeon: [name], MD;  Location: [facility] CATH LAB;  Service: Cardiovascular;  Laterality: N/A;  . Right heart catheterization N/A 03/03/2014    Procedure: RIGHT HEART CATH;  Surgeon: Kirk Ruths  Claris Gladden, MD;  Location: [facility] CATH LAB;  Service: Cardiovascular;  Laterality: N/A;    ALLERGIES: No Known Allergies  MEDICATIONS: Current Outpatient Prescriptions  Medication Sig Dispense Refill  . acetaminophen (TYLENOL) 500 MG tablet Take 500 mg by mouth every 6 (six) hours as needed (As needed for pain).    Marland Kitchen aspirin EC 81 MG tablet Take 81 mg by mouth daily.    Marland Kitchen atorvastatin (LIPITOR) 10 MG tablet Take 10 mg by mouth daily.    .  bisacodyl (DULCOLAX) 5 MG EC tablet Take 5 mg by mouth daily as needed for moderate constipation. 10 mg po orally, as needed    . clopidogrel (PLAVIX) 75 MG tablet Take 75 mg by mouth daily.    Marland Kitchen erythromycin ophthalmic ointment 1 application 2 (two) times daily. Place a thin strip into the right eye twice daily    . HYDROcodone-acetaminophen (NORCO/VICODIN) 5-325 MG per tablet Take 2 tablets by mouth every 6 (six) hours as needed for moderate pain.    . Hypromellose (GENTEAL MILD OP) Apply 1 application to eye as needed (both eyes).    Marland Kitchen ibuprofen (ADVIL,MOTRIN) 600 MG tablet Take 600 mg by mouth 3 (three) times daily.    Marland Kitchen levothyroxine (SYNTHROID, LEVOTHROID) 150 MCG tablet Take 150 mcg by mouth daily before breakfast.    . metoprolol succinate (TOPROL-XL) 25 MG 24 hr tablet Take 25 mg by mouth daily.    . Multiple Vitamin (MULTIVITAMIN WITH MINERALS) TABS tablet Take 1 tablet by mouth daily.    Marland Kitchen omeprazole (PRILOSEC) 40 MG capsule Take 40 mg by mouth daily.    . polyvinyl alcohol (LIQUIFILM TEARS) 1.4 % ophthalmic solution 2 drops at bedtime.    . sodium fluoride (FLUORISHIELD) 1.1 % GEL dental gel Instill one drop of gel per tooth space of fluoride tray. Place over teeth for 5 minutes. Remove. Spit out excess. Repeat nightly. 120 mL prn  . tamsulosin (FLOMAX) 0.4 MG CAPS capsule Take 0.4 mg by mouth.    . traMADol (ULTRAM) 50 MG tablet Take 50 mg by mouth every 6 (six) hours as needed for moderate pain.      No current facility-administered medications for this visit.    LABS: Lab Results  Component Value Date   WBC 7.2 03/31/2014   HGB 15.0 03/31/2014   HCT 44.9 03/31/2014   MCV 93.7 03/31/2014   PLT 326 03/31/2014      Component Value Date/Time   NA 139 03/03/2014 1020   K 4.5 03/03/2014 1020   CL 103 03/03/2014 1020   CO2 27 03/03/2014 1020   GLUCOSE 98 03/03/2014 1020   BUN 14 03/03/2014 1020   CREATININE 0.90 03/03/2014 1020   CREATININE 1.20 10/12/2010 1257   CALCIUM  8.7 03/03/2014 1020   GFRNONAA 76* 03/03/2014 1020   GFRAA 88* 03/03/2014 1020   Lab Results  Component Value Date   INR 1.00 03/31/2014   INR 0.96 03/03/2014   INR 0.8* 10/07/2013   No results found for: PTT  SOCIAL HISTORY: History   Social History  . Marital Status: Married    Spouse Name: N/A    Number of Children: N/A  . Years of Education: N/A   Occupational History  . retired Equities trader     Social History Main Topics  . Smoking status: Never Smoker   . Smokeless tobacco: Never Used  . Alcohol Use: 0.0 oz/week    0 Not specified per week     Comment:  Drinks wine 3 per night  . Drug Use: No  . Sexual Activity: Not Currently   Other Topics Concern  . 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   Married x 53 years.  They have three daughters.    FAMILY HISTORY: Family History  Problem Relation Age of Onset  . Heart attack Father 37  . Colon cancer Mother 85  . Heart attack Maternal Grandmother 60  . Uterine cancer Paternal Grandmother   . Stroke Neg Hx   . Diabetes Neg Hx   . Healthy Sister     REVIEW OF SYSTEMS: Reviewed with the patient included in dental record.  DENTAL HISTORY:  CHIEF COMPLAINT: Patient was referred by Dr. Isidore Moos for a preradiation therapy dental protocol examination.  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 Patient underwent a surgical resection involving right total parotidectomy, neck dissection, and reconstructive surgery with Dr. [name] on 04/25/2014 at [facility]. Patient with anticipated postoperative radiation therapy with Dr. Isidore Moos. Patient is now seen as part of a medically necessary preradiation therapy dental protocol examination.  The patient denies acute toothaches, swellings, or abscesses. Patient was last seen for a cleaning by his primary  dentist in September 2015. Patient alternates between his periodontist ( Dr. Satira Sark) and his general dentist (Dr. Ennis Forts)  for his periodontal care.  Patient denies having any unmet dental needs at this time.  DENTAL EXAMINATION: GENERAL:Patient is a well-developed, tall male in no acute distress. HEAD AND NECK: The right neck, ear, and face are consistent with previous surgical resection with Dr. [name] and plastic surgeon Dr. Brayton Caves.  Right facial paralysis is noted.  INTRAORAL EXAM:The patient has normal saliva. I do not see any evidence of abscess formation. Patient has a maximum interincisal opening of 35 mm. There is some limitation in the opening of his mouth secondary to the previous surgeries. DENTITION: The patient is missing tooth numbers 1, 16, 17. Tooth #32 is a full bony impaction. Patient has maxillary and mandibular incisal attrition. PERIODONTAL:The patient has chronic periodontitis with plaque accumulations, selective areas gingival recession and incipient mandibular anterior tooth mobility. DENTAL CARIES/SUBOPTIMAL RESTORATIONS:There are no obvious dental caries and suboptimal dental restorations noted. CROWN AND BRIDGE: The patient has multiple crown restorations that appear to be acceptable at this time. ENDODONTICS: The patient denies any acute toothache symptoms. Patient has had previous root canal therapies associated with tooth numbers 3, 14, 15, 19, and 30. There is questionable incipient periapical radiolucency at the apex of tooth #13 but this may represent radiographic artifact. The patient denies any symptoms in this area. PROSTHODONTICS: The patient does not have any partial dentures. OCCLUSION: The patient has a stable occlusion at this time.   RADIOGRAPHIC INTERPRETATION: An orthopantogram was obtained today. This was Supplemented with 9 periapical radiographs. Patient would not allow additional periapical radiographs. There are multiple missing teeth numbers 1, 16,  17. Tooth #32 was impacted. There is incipient bone loss noted. There are multiple teeth with previous root canal therapies. No obvious dental caries are noted. There is evidence of maxillary and mandibular incisal attrition. There is a questionable radiolucency at the apex of tooth #13.  Assessments: 1. History of skin cancer with metastasis to the right parotid-status post right parotidectomy, neck dissection and reconstructive surgery. 2. Preradiation therapy dental protocol examination 3. Chronic periodontitis with bone loss 4.  Gingival recession 5. Accretions-minimal 6. Incipient tooth mobility 7. Multiple missing teeth 8. Impacted tooth #32. 9. Stable occlusion 10. Maxillary and mandibular interincisal attrition 11. Questionable incipient periapical radiolucency associated with tooth #13. We will have his primary dentist follow this for future referral for root canal therapy. 12. Previous mitral valve repair with need for antibiotic premedication prior to invasive dental procedures per American Heart Association guidelines. 13. Risk for bleeding with invasive dental procedures with current Plavix therapy   PLAN/RECOMMENDATIONS: 1. I discussed the risks, benefits, and complications of various treatment options with the patient in relationship to his medical and dental conditions, anticipated radiation therapy, and radiation therapy side effects to include xerostomia, radiation caries, trismus, mucositis, taste changes, gum and jawbone changes, and risk for infection, bleeding, and osteoradionecrosis. We discussed various treatment options to include no treatment,referral to an oral surgeon for extraction of tooth #32, periodontal therapy, dental restorations, root canal therapy, crown and bridge therapy, implant therapy, and replacement of missing teeth as indicated. The patient currently wishes to proceed with impressions today for the fabrication of fluoride trays and scatter guards. The  patient wishes to defer referral to an oral surgeon for evaluation for extraction of 32 at this time due to potential for complications associated with this dental extraction and delay with the anticipated radiation therapy that he requires per Dr. Isidore Moos. Tooth #32 was out side the primary field radiation therapy per anticipated ports drawing.  The patient is to return today for insertion of upper and lower fluoride trays and scatter guards. A prescription for Fluorishield will be sent to [facility] outpatient pharmacy.  2. Discussion of findings with medical team and coordination of future medical and dental care as needed.  I spent in excess of  120 minutes during the conduct of this consultation and >50% of this time involved direct face-to-face encounter for counseling and/or coordination of the patient's care.    Lenn Cal, DDS

## 2014-05-26 NOTE — Progress Notes (Signed)
05/26/2014  Patient Name:   Adrian West Date of Birth:   [birth year] Medical Record Number: [id number]  BP 115/54 mmHg  Pulse 61  Darene Lamer Dietze now presents for insertion of upper and lower fluoride trays and scatter protection devices.  PROCEDURE: Appliances were tried in and adjusted as needed. [place] Trismus device was fabricated at 35 mm using 20 sticks. Postop instructions were provided and a written and verbal format concerning the use and care of appliances. All questions were answered. Patient to return to clinic for periodic oral examination in approximately 2-3 weeks during radiation therapy. Patient to call if questions or problems arise before then.  Patient had simulation appointment changed from January 2016 2 this coming Wednesday at 9 AM in radiation oncology.   Lenn Cal, DDS

## 2014-05-26 NOTE — Patient Instructions (Signed)

## 2014-05-26 NOTE — Progress Notes (Signed)
Patient scheduled for simulation on 06/09/13; however, thanks to dentistry expediting scatter guard fabrication, I asked my staff last week to move up his simulation to 05/28/14 so that treatment can be given with minimal delay.  Patient not obtainable thus far by phone.  Per staff, he seemed somewhat confused when they scheduled his original appt by phone. They will keep trying to call him to move up simulation date.   -----------------------------------  [name], MD

## 2014-05-26 NOTE — Progress Notes (Signed)
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  He is a patient of Dr. Isidore Moos.  Past medical history includes 2 mohs surgeries, bilateral hearing loss, thyroid disease, hypertension, GERD, diverticulosis, TIA, hyperlipidemia, MGUS, esophageal stricture, hiatal hernia, and Bell's palsy.  Medications include Lipitor, Dulcolax, Synthroid, multivitamin, and Prilosec.  Labs were reviewed.  Height: 5 feet 10 inches. Weight: 185 pounds. Usual body weight: 195 pounds March 2015. BMI: 26.54.  Patient reports difficulty chewing and swallowing on one side of his face.   He is tolerating a regular diet.   Reports weight loss occurred during rigorous testing.   Enjoys drinking Ensure Plus.  Nutrition diagnosis: Food and nutrition related knowledge deficit related to new diagnosis of cancer and associated treatments as evidenced by no prior need for nutrition related information.  Intervention: Patient educated to consume smaller more frequent meals and snacks with adequate calories and protein to promote weight maintenance. Reviewed high protein foods with patient. Recommended Ensure Plus between meals as needed to promote weight maintenance. Promote provided patient with coupons. Patient provided with fact sheets.  Questions were answered.  Teach back method used.  Monitoring, evaluation, goals: Patient will tolerate adequate calories and protein for weight maintenance throughout treatment.    Next visit: Will be scheduled as needed once treatment plan is determined.  **Disclaimer: This note was dictated with voice recognition software. Similar sounding words can inadvertently be transcribed and this note may contain transcription errors which may not have been corrected upon publication of note.**

## 2014-05-26 NOTE — Patient Instructions (Signed)

## 2014-05-28 ENCOUNTER — Encounter: Payer: Self-pay | Admitting: *Deleted

## 2014-05-28 ENCOUNTER — Ambulatory Visit
Admission: RE | Admit: 2014-05-28 | Discharge: 2014-05-28 | Disposition: A | Discharge status: Home / Self Care | Payer: Medicare Other | Source: Ambulatory Visit | Attending: Radiation Oncology | Admitting: Radiation Oncology

## 2014-05-28 VITALS — BP 121/48 | HR 61 | Temp 97.7°F

## 2014-05-28 DIAGNOSIS — Z7902 Long term (current) use of antithrombotics/antiplatelets: Secondary | ICD-10-CM | POA: Diagnosis not present

## 2014-05-28 DIAGNOSIS — E785 Hyperlipidemia, unspecified: Secondary | ICD-10-CM | POA: Diagnosis not present

## 2014-05-28 DIAGNOSIS — C7989 Secondary malignant neoplasm of other specified sites: Secondary | ICD-10-CM | POA: Diagnosis not present

## 2014-05-28 DIAGNOSIS — Z791 Long term (current) use of non-steroidal anti-inflammatories (NSAID): Secondary | ICD-10-CM | POA: Diagnosis not present

## 2014-05-28 DIAGNOSIS — R634 Abnormal weight loss: Secondary | ICD-10-CM | POA: Diagnosis not present

## 2014-05-28 DIAGNOSIS — K1233 Oral mucositis (ulcerative) due to radiation: Secondary | ICD-10-CM | POA: Diagnosis not present

## 2014-05-28 DIAGNOSIS — C089 Malignant neoplasm of major salivary gland, unspecified: Secondary | ICD-10-CM

## 2014-05-28 DIAGNOSIS — Z952 Presence of prosthetic heart valve: Secondary | ICD-10-CM | POA: Diagnosis not present

## 2014-05-28 DIAGNOSIS — Z7982 Long term (current) use of aspirin: Secondary | ICD-10-CM | POA: Diagnosis not present

## 2014-05-28 DIAGNOSIS — Z79899 Other long term (current) drug therapy: Secondary | ICD-10-CM | POA: Diagnosis not present

## 2014-05-28 DIAGNOSIS — C4440 Unspecified malignant neoplasm of skin of scalp and neck: Secondary | ICD-10-CM

## 2014-05-28 DIAGNOSIS — E039 Hypothyroidism, unspecified: Secondary | ICD-10-CM | POA: Diagnosis not present

## 2014-05-28 DIAGNOSIS — R070 Pain in throat: Secondary | ICD-10-CM | POA: Diagnosis not present

## 2014-05-28 DIAGNOSIS — G510 Bell's palsy: Secondary | ICD-10-CM | POA: Diagnosis not present

## 2014-05-28 DIAGNOSIS — K5900 Constipation, unspecified: Secondary | ICD-10-CM | POA: Diagnosis not present

## 2014-05-28 DIAGNOSIS — Z8673 Personal history of transient ischemic attack (TIA), and cerebral infarction without residual deficits: Secondary | ICD-10-CM | POA: Diagnosis not present

## 2014-05-28 DIAGNOSIS — Z6826 Body mass index (BMI) 26.0-26.9, adult: Secondary | ICD-10-CM | POA: Diagnosis not present

## 2014-05-28 DIAGNOSIS — I10 Essential (primary) hypertension: Secondary | ICD-10-CM | POA: Diagnosis not present

## 2014-05-28 DIAGNOSIS — Z510 Encounter for antineoplastic radiation therapy: Secondary | ICD-10-CM | POA: Diagnosis not present

## 2014-05-28 DIAGNOSIS — R5383 Other fatigue: Secondary | ICD-10-CM | POA: Diagnosis not present

## 2014-05-28 DIAGNOSIS — I951 Orthostatic hypotension: Secondary | ICD-10-CM | POA: Diagnosis not present

## 2014-05-28 DIAGNOSIS — Z79891 Long term (current) use of opiate analgesic: Secondary | ICD-10-CM | POA: Diagnosis not present

## 2014-05-28 DIAGNOSIS — R1310 Dysphagia, unspecified: Secondary | ICD-10-CM | POA: Diagnosis not present

## 2014-05-28 DIAGNOSIS — T66XXXS Radiation sickness, unspecified, sequela: Secondary | ICD-10-CM | POA: Diagnosis not present

## 2014-05-28 DIAGNOSIS — Z9049 Acquired absence of other specified parts of digestive tract: Secondary | ICD-10-CM | POA: Diagnosis not present

## 2014-05-28 DIAGNOSIS — K219 Gastro-esophageal reflux disease without esophagitis: Secondary | ICD-10-CM | POA: Diagnosis not present

## 2014-05-28 DIAGNOSIS — K117 Disturbances of salivary secretion: Secondary | ICD-10-CM | POA: Diagnosis not present

## 2014-05-28 DIAGNOSIS — L599 Disorder of the skin and subcutaneous tissue related to radiation, unspecified: Secondary | ICD-10-CM | POA: Diagnosis not present

## 2014-05-28 DIAGNOSIS — Z85828 Personal history of other malignant neoplasm of skin: Secondary | ICD-10-CM | POA: Diagnosis not present

## 2014-05-28 DIAGNOSIS — Z66 Do not resuscitate: Secondary | ICD-10-CM | POA: Diagnosis not present

## 2014-05-28 DIAGNOSIS — C4442 Squamous cell carcinoma of skin of scalp and neck: Secondary | ICD-10-CM | POA: Diagnosis not present

## 2014-05-28 MED ORDER — SODIUM CHLORIDE 0.9 % IJ SOLN
10.0000 mL | Freq: Once | INTRAMUSCULAR | Status: AC
  Administered 2014-05-28: 10 mL via INTRAVENOUS

## 2014-05-28 NOTE — Progress Notes (Signed)
To provide support and encouragement, care continuity and to assess for needs, met with patient during CT SIM.  After SIM, I showed him the Tomo tmt area, explained procedure for RT registration, arrival and preparation for tmt; explained tmt procedure.  He verbalized understanding.  He asked me to ask Dr. Isidore Moos about option of proton therapy tmt, to indicate that he is willing to consider.  I relayed back to him her expressed concern that further delay in starting his tmt as planned will jeopardize his prognosis.  He replied that he will discuss further during his appt this afternoon with his surgeon.  Gayleen Orem, RN, BSN, [place] at [place] [phone]

## 2014-05-28 NOTE — Progress Notes (Signed)
Complex Simulation, IMRT treatment planning note   Outpatient  Diagnosis:    ICD-9-CM ICD-10-CM   1. Skin cancer of scalp or skin of neck 173.41 C44.41    I have reviewed this case with our neuroradiologist at tumor board to help delineate his target volumes. He had extensive disease and multiple positive margins. I have discussed this case with Dr [name] of ENT as well to facilitate treatment planning. He had serious disease and is high risk for locoregional recurrence.  The patient was taken to the CT simulator and laid in the supine position on the table. An Aquaplast head and shoulder mask was custom fitted to the patient's anatomy. High-resolution CT axial imaging was obtained of the head and neck with contrast. I verified that the quality of the imaging is good for treatment planning. 1 Medically Necessary Treatment Device was fabricated and supervised by me: Aquaplast mask.   Treatment planning note I plan to treat the patient with helical Tomotherapy, IMRT. I plan to treat the patient's right posterior auricular / facial tumor bed and level II right neck nodes. I will treat to the base of skull.  I plan to treat to a total dose of 66 Gray in 33  Fractions. Dose calculation was ordered from dosimetry.  IMRT planning Note  IMRT is an important modality to deliver adequate dose to the patient's at risk tissues while sparing the patient's normal structures, including the:  parotid tissue, mandible, brain stem, brain, spinal cord, eyes, oral cavity. This justifies the use of IMRT in the patient's treatment.    -----------------------------------  [name], MD

## 2014-05-28 NOTE — Progress Notes (Signed)
IV attempt by C. Velvia Mehrer, RN without results.  Established IV by S. Presnell, RN with a 22 gauge angiocath in the right wrist.  Brisk blood return. Patient very accommodating and patcient

## 2014-06-02 ENCOUNTER — Other Ambulatory Visit: Payer: Self-pay | Admitting: Radiation Oncology

## 2014-06-02 ENCOUNTER — Ambulatory Visit
Admission: RE | Admit: 2014-06-02 | Discharge: 2014-06-02 | Disposition: A | Discharge status: Home / Self Care | Payer: Medicare Other | Source: Ambulatory Visit | Attending: Radiation Oncology | Admitting: Radiation Oncology

## 2014-06-02 DIAGNOSIS — C4490 Unspecified malignant neoplasm of skin, unspecified: Secondary | ICD-10-CM

## 2014-06-03 ENCOUNTER — Telehealth: Payer: Self-pay | Admitting: *Deleted

## 2014-06-03 NOTE — Telephone Encounter (Signed)
Patient called to express concern for Adrian West start next Wed. He was extremely uncomfortable during SIM in large part because of his bad back and the hard table. I explained tmt will not be as long. He would like Rx for an anxiolytic. He does not recall taking valium or ativan in the past. Someone will be driving him from [place].  Dr. Isidore Moos informed.  Gayleen Orem, RN, BSN, [place] at [place] [phone]

## 2014-06-09 ENCOUNTER — Ambulatory Visit: Payer: Medicare Other | Admitting: Radiation Oncology

## 2014-06-09 ENCOUNTER — Ambulatory Visit: Payer: Medicare Other

## 2014-06-09 DIAGNOSIS — K1233 Oral mucositis (ulcerative) due to radiation: Secondary | ICD-10-CM | POA: Diagnosis not present

## 2014-06-09 DIAGNOSIS — C4441 Basal cell carcinoma of skin of scalp and neck: Secondary | ICD-10-CM | POA: Diagnosis not present

## 2014-06-09 DIAGNOSIS — T66XXXS Radiation sickness, unspecified, sequela: Secondary | ICD-10-CM | POA: Diagnosis not present

## 2014-06-09 DIAGNOSIS — R634 Abnormal weight loss: Secondary | ICD-10-CM | POA: Diagnosis not present

## 2014-06-09 DIAGNOSIS — Z85828 Personal history of other malignant neoplasm of skin: Secondary | ICD-10-CM | POA: Diagnosis not present

## 2014-06-09 DIAGNOSIS — R5383 Other fatigue: Secondary | ICD-10-CM | POA: Diagnosis not present

## 2014-06-09 DIAGNOSIS — Z79899 Other long term (current) drug therapy: Secondary | ICD-10-CM | POA: Diagnosis not present

## 2014-06-09 DIAGNOSIS — R070 Pain in throat: Secondary | ICD-10-CM | POA: Diagnosis not present

## 2014-06-09 DIAGNOSIS — E785 Hyperlipidemia, unspecified: Secondary | ICD-10-CM | POA: Diagnosis not present

## 2014-06-09 DIAGNOSIS — L599 Disorder of the skin and subcutaneous tissue related to radiation, unspecified: Secondary | ICD-10-CM | POA: Diagnosis not present

## 2014-06-09 DIAGNOSIS — Z952 Presence of prosthetic heart valve: Secondary | ICD-10-CM | POA: Diagnosis not present

## 2014-06-09 DIAGNOSIS — E039 Hypothyroidism, unspecified: Secondary | ICD-10-CM | POA: Diagnosis not present

## 2014-06-09 DIAGNOSIS — Z7982 Long term (current) use of aspirin: Secondary | ICD-10-CM | POA: Diagnosis not present

## 2014-06-09 DIAGNOSIS — K219 Gastro-esophageal reflux disease without esophagitis: Secondary | ICD-10-CM | POA: Diagnosis not present

## 2014-06-09 DIAGNOSIS — Z9049 Acquired absence of other specified parts of digestive tract: Secondary | ICD-10-CM | POA: Diagnosis not present

## 2014-06-09 DIAGNOSIS — Z6826 Body mass index (BMI) 26.0-26.9, adult: Secondary | ICD-10-CM | POA: Diagnosis not present

## 2014-06-09 DIAGNOSIS — K5900 Constipation, unspecified: Secondary | ICD-10-CM | POA: Diagnosis not present

## 2014-06-09 DIAGNOSIS — C7989 Secondary malignant neoplasm of other specified sites: Secondary | ICD-10-CM | POA: Diagnosis not present

## 2014-06-09 DIAGNOSIS — K117 Disturbances of salivary secretion: Secondary | ICD-10-CM | POA: Diagnosis not present

## 2014-06-09 DIAGNOSIS — Z510 Encounter for antineoplastic radiation therapy: Secondary | ICD-10-CM | POA: Diagnosis not present

## 2014-06-09 DIAGNOSIS — C4442 Squamous cell carcinoma of skin of scalp and neck: Secondary | ICD-10-CM | POA: Diagnosis not present

## 2014-06-09 DIAGNOSIS — R1310 Dysphagia, unspecified: Secondary | ICD-10-CM | POA: Diagnosis not present

## 2014-06-09 DIAGNOSIS — I10 Essential (primary) hypertension: Secondary | ICD-10-CM | POA: Diagnosis not present

## 2014-06-09 DIAGNOSIS — Z791 Long term (current) use of non-steroidal anti-inflammatories (NSAID): Secondary | ICD-10-CM | POA: Diagnosis not present

## 2014-06-09 DIAGNOSIS — G510 Bell's palsy: Secondary | ICD-10-CM | POA: Diagnosis not present

## 2014-06-09 DIAGNOSIS — I951 Orthostatic hypotension: Secondary | ICD-10-CM | POA: Diagnosis not present

## 2014-06-11 ENCOUNTER — Ambulatory Visit
Admission: RE | Admit: 2014-06-11 | Discharge: 2014-06-11 | Disposition: A | Discharge status: Home / Self Care | Payer: Medicare Other | Source: Ambulatory Visit | Attending: Radiation Oncology | Admitting: Radiation Oncology

## 2014-06-11 ENCOUNTER — Encounter: Payer: Self-pay | Admitting: *Deleted

## 2014-06-11 DIAGNOSIS — G510 Bell's palsy: Secondary | ICD-10-CM | POA: Diagnosis not present

## 2014-06-11 DIAGNOSIS — Z85828 Personal history of other malignant neoplasm of skin: Secondary | ICD-10-CM | POA: Diagnosis not present

## 2014-06-11 DIAGNOSIS — E785 Hyperlipidemia, unspecified: Secondary | ICD-10-CM | POA: Diagnosis not present

## 2014-06-11 DIAGNOSIS — K5900 Constipation, unspecified: Secondary | ICD-10-CM | POA: Diagnosis not present

## 2014-06-11 DIAGNOSIS — K219 Gastro-esophageal reflux disease without esophagitis: Secondary | ICD-10-CM | POA: Diagnosis not present

## 2014-06-11 DIAGNOSIS — R1310 Dysphagia, unspecified: Secondary | ICD-10-CM | POA: Diagnosis not present

## 2014-06-11 DIAGNOSIS — Z6826 Body mass index (BMI) 26.0-26.9, adult: Secondary | ICD-10-CM | POA: Diagnosis not present

## 2014-06-11 DIAGNOSIS — K1233 Oral mucositis (ulcerative) due to radiation: Secondary | ICD-10-CM | POA: Diagnosis not present

## 2014-06-11 DIAGNOSIS — T66XXXS Radiation sickness, unspecified, sequela: Secondary | ICD-10-CM | POA: Diagnosis not present

## 2014-06-11 DIAGNOSIS — I10 Essential (primary) hypertension: Secondary | ICD-10-CM | POA: Diagnosis not present

## 2014-06-11 DIAGNOSIS — R070 Pain in throat: Secondary | ICD-10-CM | POA: Diagnosis not present

## 2014-06-11 DIAGNOSIS — C4442 Squamous cell carcinoma of skin of scalp and neck: Secondary | ICD-10-CM | POA: Diagnosis not present

## 2014-06-11 DIAGNOSIS — L599 Disorder of the skin and subcutaneous tissue related to radiation, unspecified: Secondary | ICD-10-CM | POA: Diagnosis not present

## 2014-06-11 DIAGNOSIS — E039 Hypothyroidism, unspecified: Secondary | ICD-10-CM | POA: Diagnosis not present

## 2014-06-11 DIAGNOSIS — C4440 Unspecified malignant neoplasm of skin of scalp and neck: Secondary | ICD-10-CM

## 2014-06-11 DIAGNOSIS — K117 Disturbances of salivary secretion: Secondary | ICD-10-CM | POA: Diagnosis not present

## 2014-06-11 DIAGNOSIS — C4441 Basal cell carcinoma of skin of scalp and neck: Secondary | ICD-10-CM | POA: Diagnosis not present

## 2014-06-11 DIAGNOSIS — Z7982 Long term (current) use of aspirin: Secondary | ICD-10-CM | POA: Diagnosis not present

## 2014-06-11 DIAGNOSIS — Z9049 Acquired absence of other specified parts of digestive tract: Secondary | ICD-10-CM | POA: Diagnosis not present

## 2014-06-11 DIAGNOSIS — Z952 Presence of prosthetic heart valve: Secondary | ICD-10-CM | POA: Diagnosis not present

## 2014-06-11 DIAGNOSIS — C7989 Secondary malignant neoplasm of other specified sites: Secondary | ICD-10-CM | POA: Diagnosis not present

## 2014-06-11 DIAGNOSIS — Z79899 Other long term (current) drug therapy: Secondary | ICD-10-CM | POA: Diagnosis not present

## 2014-06-11 DIAGNOSIS — R634 Abnormal weight loss: Secondary | ICD-10-CM | POA: Diagnosis not present

## 2014-06-11 DIAGNOSIS — Z791 Long term (current) use of non-steroidal anti-inflammatories (NSAID): Secondary | ICD-10-CM | POA: Diagnosis not present

## 2014-06-11 DIAGNOSIS — Z510 Encounter for antineoplastic radiation therapy: Secondary | ICD-10-CM | POA: Diagnosis not present

## 2014-06-11 DIAGNOSIS — R5383 Other fatigue: Secondary | ICD-10-CM | POA: Diagnosis not present

## 2014-06-11 DIAGNOSIS — I951 Orthostatic hypotension: Secondary | ICD-10-CM | POA: Diagnosis not present

## 2014-06-11 NOTE — Progress Notes (Signed)
IMRT Device Note    IMRT treatment device approved. The code is [id number].  -----------------------------------  [name], MD

## 2014-06-12 ENCOUNTER — Ambulatory Visit
Admission: RE | Admit: 2014-06-12 | Discharge: 2014-06-12 | Disposition: A | Discharge status: Home / Self Care | Payer: Medicare Other | Source: Ambulatory Visit | Attending: Radiation Oncology | Admitting: Radiation Oncology

## 2014-06-12 DIAGNOSIS — C4442 Squamous cell carcinoma of skin of scalp and neck: Secondary | ICD-10-CM | POA: Diagnosis not present

## 2014-06-12 DIAGNOSIS — C4441 Basal cell carcinoma of skin of scalp and neck: Secondary | ICD-10-CM | POA: Diagnosis not present

## 2014-06-12 DIAGNOSIS — R634 Abnormal weight loss: Secondary | ICD-10-CM | POA: Diagnosis not present

## 2014-06-12 DIAGNOSIS — R070 Pain in throat: Secondary | ICD-10-CM | POA: Diagnosis not present

## 2014-06-12 DIAGNOSIS — Z79899 Other long term (current) drug therapy: Secondary | ICD-10-CM | POA: Diagnosis not present

## 2014-06-12 DIAGNOSIS — G510 Bell's palsy: Secondary | ICD-10-CM | POA: Diagnosis not present

## 2014-06-12 DIAGNOSIS — K1233 Oral mucositis (ulcerative) due to radiation: Secondary | ICD-10-CM | POA: Diagnosis not present

## 2014-06-12 DIAGNOSIS — K219 Gastro-esophageal reflux disease without esophagitis: Secondary | ICD-10-CM | POA: Diagnosis not present

## 2014-06-12 DIAGNOSIS — E039 Hypothyroidism, unspecified: Secondary | ICD-10-CM | POA: Diagnosis not present

## 2014-06-12 DIAGNOSIS — R5383 Other fatigue: Secondary | ICD-10-CM | POA: Diagnosis not present

## 2014-06-12 DIAGNOSIS — L599 Disorder of the skin and subcutaneous tissue related to radiation, unspecified: Secondary | ICD-10-CM | POA: Diagnosis not present

## 2014-06-12 DIAGNOSIS — R1310 Dysphagia, unspecified: Secondary | ICD-10-CM | POA: Diagnosis not present

## 2014-06-12 DIAGNOSIS — I951 Orthostatic hypotension: Secondary | ICD-10-CM | POA: Diagnosis not present

## 2014-06-12 DIAGNOSIS — I10 Essential (primary) hypertension: Secondary | ICD-10-CM | POA: Diagnosis not present

## 2014-06-12 DIAGNOSIS — C7989 Secondary malignant neoplasm of other specified sites: Secondary | ICD-10-CM | POA: Diagnosis not present

## 2014-06-12 DIAGNOSIS — K5900 Constipation, unspecified: Secondary | ICD-10-CM | POA: Diagnosis not present

## 2014-06-12 DIAGNOSIS — K117 Disturbances of salivary secretion: Secondary | ICD-10-CM | POA: Diagnosis not present

## 2014-06-12 DIAGNOSIS — T66XXXS Radiation sickness, unspecified, sequela: Secondary | ICD-10-CM | POA: Diagnosis not present

## 2014-06-12 DIAGNOSIS — Z85828 Personal history of other malignant neoplasm of skin: Secondary | ICD-10-CM | POA: Diagnosis not present

## 2014-06-12 DIAGNOSIS — Z9049 Acquired absence of other specified parts of digestive tract: Secondary | ICD-10-CM | POA: Diagnosis not present

## 2014-06-12 DIAGNOSIS — Z6826 Body mass index (BMI) 26.0-26.9, adult: Secondary | ICD-10-CM | POA: Diagnosis not present

## 2014-06-12 DIAGNOSIS — Z791 Long term (current) use of non-steroidal anti-inflammatories (NSAID): Secondary | ICD-10-CM | POA: Diagnosis not present

## 2014-06-12 DIAGNOSIS — Z952 Presence of prosthetic heart valve: Secondary | ICD-10-CM | POA: Diagnosis not present

## 2014-06-12 DIAGNOSIS — Z7982 Long term (current) use of aspirin: Secondary | ICD-10-CM | POA: Diagnosis not present

## 2014-06-12 DIAGNOSIS — E785 Hyperlipidemia, unspecified: Secondary | ICD-10-CM | POA: Diagnosis not present

## 2014-06-12 DIAGNOSIS — Z510 Encounter for antineoplastic radiation therapy: Secondary | ICD-10-CM | POA: Diagnosis not present

## 2014-06-12 NOTE — Progress Notes (Signed)
To provide support and encouragement, care continuity and to assess for needs, met with patient and his wife during first [place].   Gayleen Orem, RN, BSN, [place] at [place] [phone]

## 2014-06-13 ENCOUNTER — Ambulatory Visit: Payer: Medicare Other

## 2014-06-13 ENCOUNTER — Ambulatory Visit
Admission: RE | Admit: 2014-06-13 | Discharge: 2014-06-13 | Disposition: A | Discharge status: Home / Self Care | Payer: Medicare Other | Source: Ambulatory Visit | Attending: Radiation Oncology | Admitting: Radiation Oncology

## 2014-06-13 DIAGNOSIS — Z510 Encounter for antineoplastic radiation therapy: Secondary | ICD-10-CM | POA: Diagnosis not present

## 2014-06-13 DIAGNOSIS — R070 Pain in throat: Secondary | ICD-10-CM | POA: Diagnosis not present

## 2014-06-13 DIAGNOSIS — C4441 Basal cell carcinoma of skin of scalp and neck: Secondary | ICD-10-CM | POA: Diagnosis not present

## 2014-06-13 DIAGNOSIS — K117 Disturbances of salivary secretion: Secondary | ICD-10-CM | POA: Diagnosis not present

## 2014-06-13 DIAGNOSIS — C4442 Squamous cell carcinoma of skin of scalp and neck: Secondary | ICD-10-CM | POA: Diagnosis not present

## 2014-06-13 DIAGNOSIS — Z9049 Acquired absence of other specified parts of digestive tract: Secondary | ICD-10-CM | POA: Diagnosis not present

## 2014-06-13 DIAGNOSIS — C7989 Secondary malignant neoplasm of other specified sites: Secondary | ICD-10-CM | POA: Diagnosis not present

## 2014-06-13 DIAGNOSIS — Z79899 Other long term (current) drug therapy: Secondary | ICD-10-CM | POA: Diagnosis not present

## 2014-06-13 DIAGNOSIS — E785 Hyperlipidemia, unspecified: Secondary | ICD-10-CM | POA: Diagnosis not present

## 2014-06-13 DIAGNOSIS — K1233 Oral mucositis (ulcerative) due to radiation: Secondary | ICD-10-CM | POA: Diagnosis not present

## 2014-06-13 DIAGNOSIS — L599 Disorder of the skin and subcutaneous tissue related to radiation, unspecified: Secondary | ICD-10-CM | POA: Diagnosis not present

## 2014-06-13 DIAGNOSIS — Z952 Presence of prosthetic heart valve: Secondary | ICD-10-CM | POA: Diagnosis not present

## 2014-06-13 DIAGNOSIS — G510 Bell's palsy: Secondary | ICD-10-CM | POA: Diagnosis not present

## 2014-06-13 DIAGNOSIS — R634 Abnormal weight loss: Secondary | ICD-10-CM | POA: Diagnosis not present

## 2014-06-13 DIAGNOSIS — Z6826 Body mass index (BMI) 26.0-26.9, adult: Secondary | ICD-10-CM | POA: Diagnosis not present

## 2014-06-13 DIAGNOSIS — I951 Orthostatic hypotension: Secondary | ICD-10-CM | POA: Diagnosis not present

## 2014-06-13 DIAGNOSIS — Z7982 Long term (current) use of aspirin: Secondary | ICD-10-CM | POA: Diagnosis not present

## 2014-06-13 DIAGNOSIS — Z791 Long term (current) use of non-steroidal anti-inflammatories (NSAID): Secondary | ICD-10-CM | POA: Diagnosis not present

## 2014-06-13 DIAGNOSIS — R5383 Other fatigue: Secondary | ICD-10-CM | POA: Diagnosis not present

## 2014-06-13 DIAGNOSIS — T66XXXS Radiation sickness, unspecified, sequela: Secondary | ICD-10-CM | POA: Diagnosis not present

## 2014-06-13 DIAGNOSIS — E039 Hypothyroidism, unspecified: Secondary | ICD-10-CM | POA: Diagnosis not present

## 2014-06-13 DIAGNOSIS — K219 Gastro-esophageal reflux disease without esophagitis: Secondary | ICD-10-CM | POA: Diagnosis not present

## 2014-06-13 DIAGNOSIS — I10 Essential (primary) hypertension: Secondary | ICD-10-CM | POA: Diagnosis not present

## 2014-06-13 DIAGNOSIS — Z85828 Personal history of other malignant neoplasm of skin: Secondary | ICD-10-CM | POA: Diagnosis not present

## 2014-06-13 DIAGNOSIS — K5900 Constipation, unspecified: Secondary | ICD-10-CM | POA: Diagnosis not present

## 2014-06-13 DIAGNOSIS — R1310 Dysphagia, unspecified: Secondary | ICD-10-CM | POA: Diagnosis not present

## 2014-06-16 ENCOUNTER — Ambulatory Visit
Admission: RE | Admit: 2014-06-16 | Discharge: 2014-06-16 | Disposition: A | Discharge status: Home / Self Care | Payer: Medicare Other | Source: Ambulatory Visit | Attending: Radiation Oncology | Admitting: Radiation Oncology

## 2014-06-16 ENCOUNTER — Encounter: Payer: Self-pay | Admitting: Radiation Oncology

## 2014-06-16 DIAGNOSIS — C7989 Secondary malignant neoplasm of other specified sites: Secondary | ICD-10-CM | POA: Diagnosis not present

## 2014-06-16 DIAGNOSIS — Z510 Encounter for antineoplastic radiation therapy: Secondary | ICD-10-CM | POA: Diagnosis not present

## 2014-06-16 DIAGNOSIS — E785 Hyperlipidemia, unspecified: Secondary | ICD-10-CM | POA: Diagnosis not present

## 2014-06-16 DIAGNOSIS — Z79899 Other long term (current) drug therapy: Secondary | ICD-10-CM | POA: Diagnosis not present

## 2014-06-16 DIAGNOSIS — R1310 Dysphagia, unspecified: Secondary | ICD-10-CM | POA: Diagnosis not present

## 2014-06-16 DIAGNOSIS — I951 Orthostatic hypotension: Secondary | ICD-10-CM | POA: Diagnosis not present

## 2014-06-16 DIAGNOSIS — K117 Disturbances of salivary secretion: Secondary | ICD-10-CM | POA: Diagnosis not present

## 2014-06-16 DIAGNOSIS — Z9049 Acquired absence of other specified parts of digestive tract: Secondary | ICD-10-CM | POA: Diagnosis not present

## 2014-06-16 DIAGNOSIS — I10 Essential (primary) hypertension: Secondary | ICD-10-CM | POA: Diagnosis not present

## 2014-06-16 DIAGNOSIS — Z952 Presence of prosthetic heart valve: Secondary | ICD-10-CM | POA: Diagnosis not present

## 2014-06-16 DIAGNOSIS — R634 Abnormal weight loss: Secondary | ICD-10-CM | POA: Diagnosis not present

## 2014-06-16 DIAGNOSIS — R5383 Other fatigue: Secondary | ICD-10-CM | POA: Diagnosis not present

## 2014-06-16 DIAGNOSIS — C4442 Squamous cell carcinoma of skin of scalp and neck: Secondary | ICD-10-CM | POA: Diagnosis not present

## 2014-06-16 DIAGNOSIS — Z791 Long term (current) use of non-steroidal anti-inflammatories (NSAID): Secondary | ICD-10-CM | POA: Diagnosis not present

## 2014-06-16 DIAGNOSIS — C4440 Unspecified malignant neoplasm of skin of scalp and neck: Secondary | ICD-10-CM

## 2014-06-16 DIAGNOSIS — T66XXXS Radiation sickness, unspecified, sequela: Secondary | ICD-10-CM | POA: Diagnosis not present

## 2014-06-16 DIAGNOSIS — Z85828 Personal history of other malignant neoplasm of skin: Secondary | ICD-10-CM | POA: Diagnosis not present

## 2014-06-16 DIAGNOSIS — C4441 Basal cell carcinoma of skin of scalp and neck: Secondary | ICD-10-CM | POA: Insufficient documentation

## 2014-06-16 DIAGNOSIS — E039 Hypothyroidism, unspecified: Secondary | ICD-10-CM | POA: Diagnosis not present

## 2014-06-16 DIAGNOSIS — R070 Pain in throat: Secondary | ICD-10-CM | POA: Diagnosis not present

## 2014-06-16 DIAGNOSIS — L599 Disorder of the skin and subcutaneous tissue related to radiation, unspecified: Secondary | ICD-10-CM | POA: Diagnosis not present

## 2014-06-16 DIAGNOSIS — G510 Bell's palsy: Secondary | ICD-10-CM | POA: Diagnosis not present

## 2014-06-16 DIAGNOSIS — K219 Gastro-esophageal reflux disease without esophagitis: Secondary | ICD-10-CM | POA: Diagnosis not present

## 2014-06-16 DIAGNOSIS — Z7982 Long term (current) use of aspirin: Secondary | ICD-10-CM | POA: Diagnosis not present

## 2014-06-16 DIAGNOSIS — K1233 Oral mucositis (ulcerative) due to radiation: Secondary | ICD-10-CM | POA: Diagnosis not present

## 2014-06-16 DIAGNOSIS — Z6826 Body mass index (BMI) 26.0-26.9, adult: Secondary | ICD-10-CM | POA: Diagnosis not present

## 2014-06-16 DIAGNOSIS — K5900 Constipation, unspecified: Secondary | ICD-10-CM | POA: Diagnosis not present

## 2014-06-16 MED ORDER — BIAFINE EX EMUL
Freq: Two times a day (BID) | CUTANEOUS | Status: DC
  Administered 2014-06-16: 11:00:00 via TOPICAL

## 2014-06-16 NOTE — Progress Notes (Signed)
Patient denies pain, fatigue, loss of appetite, difficulty eating, swallowing. He has met with nutritionist. He occasionally drinks Ensure.  Patient education completed with patient. Gave him "Radiation and You" booklet with all pertinent information marked and discussed, re: fatigue, hair loss/care, nausea/vomting/management, mouth irritation/management, skin irritation/care, throat irritation/management, nutrition, pain. Gave pt Biafine with instructions for proper use.  Teach back method used. Pt verbalized understanding. No questions verbalized.

## 2014-06-16 NOTE — Progress Notes (Signed)
See education tab for documentation of pt education

## 2014-06-16 NOTE — Progress Notes (Signed)
   Weekly Management Note:  Outpatient    ICD-9-CM ICD-10-CM   1. Skin cancer of scalp or skin of neck 173.41 C44.41 topical emolient (BIAFINE) emulsion    Current Dose:  8 Gy  Projected Dose: 66 Gy   Narrative:  The patient presents for routine under treatment assessment.  CBCT/MVCT images/Port film x-rays were reviewed.  The chart was checked. His eyes feel tired. No other new complaints  Physical Findings:  Wt Readings from Last 3 Encounters:  06/16/14 187 lb 6.4 oz (85.004 kg)  05/21/14 185 lb (83.915 kg)  05/05/14 184 lb 9.6 oz (83.734 kg)    weight is 187 lb 6.4 oz (85.004 kg). His oral temperature is 98.3 F (36.8 C). His blood pressure is 134/56 and his pulse is 50. His respiration is 22.  NAD, no acute changes.   CBC    Component Value Date/Time   WBC 7.2 03/31/2014 1140   WBC 5.2 04/17/2013 0959   RBC 4.79 03/31/2014 1140   RBC 5.51 04/17/2013 0959   HGB 15.0 03/31/2014 1140   HGB 17.5* 04/17/2013 0959   HCT 44.9 03/31/2014 1140   HCT 51.8* 04/17/2013 0959   PLT 326 03/31/2014 1140   PLT 197 04/17/2013 0959   MCV 93.7 03/31/2014 1140   MCV 94.0 04/17/2013 0959   MCH 31.3 03/31/2014 1140   MCH 31.7 04/17/2013 0959   MCHC 33.4 03/31/2014 1140   MCHC 33.8 04/17/2013 0959   RDW 13.0 03/31/2014 1140   RDW 13.1 04/17/2013 0959   LYMPHSABS 0.8 03/31/2014 1140   LYMPHSABS 1.0 04/17/2013 0959   MONOABS 0.3 03/31/2014 1140   MONOABS 0.4 04/17/2013 0959   EOSABS 0.1 03/31/2014 1140   EOSABS 0.1 04/17/2013 0959   BASOSABS 0.0 03/31/2014 1140   BASOSABS 0.0 04/17/2013 0959     CMP     Component Value Date/Time   NA 139 03/03/2014 1020   K 4.5 03/03/2014 1020   CL 103 03/03/2014 1020   CO2 27 03/03/2014 1020   GLUCOSE 98 03/03/2014 1020   BUN 14 03/03/2014 1020   CREATININE 0.90 03/03/2014 1020   CREATININE 1.20 10/12/2010 1257   CALCIUM 8.7 03/03/2014 1020   PROT 5.6* 10/22/2013 1218   ALBUMIN 3.0* 10/22/2013 1218   AST 21 10/22/2013 1218   ALT 23  10/22/2013 1218   ALKPHOS 60 10/22/2013 1218   BILITOT 0.5 10/22/2013 1218   GFRNONAA 76* 03/03/2014 1020   GFRAA 88* 03/03/2014 1020   Lab Results  Component Value Date   TSH 4.59* 02/13/2014     Impression:  The patient is tolerating radiotherapy.   Plan:  Continue radiotherapy as planned. He has several clarifying questions re: dental care.  I asked Gayleen Orem, RN, our Head and Neck Oncology Navigator to contact the patient and make sure his questions are directed to Dr. [name].   He had questions about his prognosis, and understands that his disease is serious with a significant chance of recurrence despite radiotherapy. However, I estimated that RT would decreased his chance of recurrence by approximately half.  I am not sure why his eyes feel tired, but I do not this this is due to RT. -----------------------------------  [name], MD

## 2014-06-17 ENCOUNTER — Ambulatory Visit
Admission: RE | Admit: 2014-06-17 | Discharge: 2014-06-17 | Disposition: A | Discharge status: Home / Self Care | Payer: Medicare Other | Source: Ambulatory Visit | Attending: Radiation Oncology | Admitting: Radiation Oncology

## 2014-06-17 DIAGNOSIS — R070 Pain in throat: Secondary | ICD-10-CM | POA: Diagnosis not present

## 2014-06-17 DIAGNOSIS — Z6826 Body mass index (BMI) 26.0-26.9, adult: Secondary | ICD-10-CM | POA: Diagnosis not present

## 2014-06-17 DIAGNOSIS — Z85828 Personal history of other malignant neoplasm of skin: Secondary | ICD-10-CM | POA: Diagnosis not present

## 2014-06-17 DIAGNOSIS — E039 Hypothyroidism, unspecified: Secondary | ICD-10-CM | POA: Diagnosis not present

## 2014-06-17 DIAGNOSIS — R1310 Dysphagia, unspecified: Secondary | ICD-10-CM | POA: Diagnosis not present

## 2014-06-17 DIAGNOSIS — R5383 Other fatigue: Secondary | ICD-10-CM | POA: Diagnosis not present

## 2014-06-17 DIAGNOSIS — I951 Orthostatic hypotension: Secondary | ICD-10-CM | POA: Diagnosis not present

## 2014-06-17 DIAGNOSIS — I10 Essential (primary) hypertension: Secondary | ICD-10-CM | POA: Diagnosis not present

## 2014-06-17 DIAGNOSIS — Z7982 Long term (current) use of aspirin: Secondary | ICD-10-CM | POA: Diagnosis not present

## 2014-06-17 DIAGNOSIS — K219 Gastro-esophageal reflux disease without esophagitis: Secondary | ICD-10-CM | POA: Diagnosis not present

## 2014-06-17 DIAGNOSIS — C4442 Squamous cell carcinoma of skin of scalp and neck: Secondary | ICD-10-CM | POA: Diagnosis not present

## 2014-06-17 DIAGNOSIS — Z9049 Acquired absence of other specified parts of digestive tract: Secondary | ICD-10-CM | POA: Diagnosis not present

## 2014-06-17 DIAGNOSIS — Z952 Presence of prosthetic heart valve: Secondary | ICD-10-CM | POA: Diagnosis not present

## 2014-06-17 DIAGNOSIS — L599 Disorder of the skin and subcutaneous tissue related to radiation, unspecified: Secondary | ICD-10-CM | POA: Diagnosis not present

## 2014-06-17 DIAGNOSIS — C4441 Basal cell carcinoma of skin of scalp and neck: Secondary | ICD-10-CM | POA: Diagnosis not present

## 2014-06-17 DIAGNOSIS — Z79899 Other long term (current) drug therapy: Secondary | ICD-10-CM | POA: Diagnosis not present

## 2014-06-17 DIAGNOSIS — Z791 Long term (current) use of non-steroidal anti-inflammatories (NSAID): Secondary | ICD-10-CM | POA: Diagnosis not present

## 2014-06-17 DIAGNOSIS — R634 Abnormal weight loss: Secondary | ICD-10-CM | POA: Diagnosis not present

## 2014-06-17 DIAGNOSIS — T66XXXS Radiation sickness, unspecified, sequela: Secondary | ICD-10-CM | POA: Diagnosis not present

## 2014-06-17 DIAGNOSIS — K117 Disturbances of salivary secretion: Secondary | ICD-10-CM | POA: Diagnosis not present

## 2014-06-17 DIAGNOSIS — C7989 Secondary malignant neoplasm of other specified sites: Secondary | ICD-10-CM | POA: Diagnosis not present

## 2014-06-17 DIAGNOSIS — E785 Hyperlipidemia, unspecified: Secondary | ICD-10-CM | POA: Diagnosis not present

## 2014-06-17 DIAGNOSIS — K1233 Oral mucositis (ulcerative) due to radiation: Secondary | ICD-10-CM | POA: Diagnosis not present

## 2014-06-17 DIAGNOSIS — G510 Bell's palsy: Secondary | ICD-10-CM | POA: Diagnosis not present

## 2014-06-17 DIAGNOSIS — Z510 Encounter for antineoplastic radiation therapy: Secondary | ICD-10-CM | POA: Diagnosis not present

## 2014-06-17 DIAGNOSIS — K5900 Constipation, unspecified: Secondary | ICD-10-CM | POA: Diagnosis not present

## 2014-06-18 ENCOUNTER — Ambulatory Visit
Admission: RE | Admit: 2014-06-18 | Discharge: 2014-06-18 | Disposition: A | Discharge status: Home / Self Care | Payer: Medicare Other | Source: Ambulatory Visit | Attending: Radiation Oncology | Admitting: Radiation Oncology

## 2014-06-18 ENCOUNTER — Telehealth: Payer: Self-pay | Admitting: *Deleted

## 2014-06-18 DIAGNOSIS — Z7982 Long term (current) use of aspirin: Secondary | ICD-10-CM | POA: Diagnosis not present

## 2014-06-18 DIAGNOSIS — E785 Hyperlipidemia, unspecified: Secondary | ICD-10-CM | POA: Diagnosis not present

## 2014-06-18 DIAGNOSIS — Z952 Presence of prosthetic heart valve: Secondary | ICD-10-CM | POA: Diagnosis not present

## 2014-06-18 DIAGNOSIS — I951 Orthostatic hypotension: Secondary | ICD-10-CM | POA: Diagnosis not present

## 2014-06-18 DIAGNOSIS — Z791 Long term (current) use of non-steroidal anti-inflammatories (NSAID): Secondary | ICD-10-CM | POA: Diagnosis not present

## 2014-06-18 DIAGNOSIS — R634 Abnormal weight loss: Secondary | ICD-10-CM | POA: Diagnosis not present

## 2014-06-18 DIAGNOSIS — K1233 Oral mucositis (ulcerative) due to radiation: Secondary | ICD-10-CM | POA: Diagnosis not present

## 2014-06-18 DIAGNOSIS — R5383 Other fatigue: Secondary | ICD-10-CM | POA: Diagnosis not present

## 2014-06-18 DIAGNOSIS — K117 Disturbances of salivary secretion: Secondary | ICD-10-CM | POA: Diagnosis not present

## 2014-06-18 DIAGNOSIS — C7989 Secondary malignant neoplasm of other specified sites: Secondary | ICD-10-CM | POA: Diagnosis not present

## 2014-06-18 DIAGNOSIS — I10 Essential (primary) hypertension: Secondary | ICD-10-CM | POA: Diagnosis not present

## 2014-06-18 DIAGNOSIS — R1310 Dysphagia, unspecified: Secondary | ICD-10-CM | POA: Diagnosis not present

## 2014-06-18 DIAGNOSIS — K219 Gastro-esophageal reflux disease without esophagitis: Secondary | ICD-10-CM | POA: Diagnosis not present

## 2014-06-18 DIAGNOSIS — Z9049 Acquired absence of other specified parts of digestive tract: Secondary | ICD-10-CM | POA: Diagnosis not present

## 2014-06-18 DIAGNOSIS — R070 Pain in throat: Secondary | ICD-10-CM | POA: Diagnosis not present

## 2014-06-18 DIAGNOSIS — K5900 Constipation, unspecified: Secondary | ICD-10-CM | POA: Diagnosis not present

## 2014-06-18 DIAGNOSIS — Z79899 Other long term (current) drug therapy: Secondary | ICD-10-CM | POA: Diagnosis not present

## 2014-06-18 DIAGNOSIS — L599 Disorder of the skin and subcutaneous tissue related to radiation, unspecified: Secondary | ICD-10-CM | POA: Diagnosis not present

## 2014-06-18 DIAGNOSIS — Z510 Encounter for antineoplastic radiation therapy: Secondary | ICD-10-CM | POA: Diagnosis not present

## 2014-06-18 DIAGNOSIS — G510 Bell's palsy: Secondary | ICD-10-CM | POA: Diagnosis not present

## 2014-06-18 DIAGNOSIS — Z85828 Personal history of other malignant neoplasm of skin: Secondary | ICD-10-CM | POA: Diagnosis not present

## 2014-06-18 DIAGNOSIS — Z6826 Body mass index (BMI) 26.0-26.9, adult: Secondary | ICD-10-CM | POA: Diagnosis not present

## 2014-06-18 DIAGNOSIS — C4442 Squamous cell carcinoma of skin of scalp and neck: Secondary | ICD-10-CM | POA: Diagnosis not present

## 2014-06-18 DIAGNOSIS — E039 Hypothyroidism, unspecified: Secondary | ICD-10-CM | POA: Diagnosis not present

## 2014-06-18 DIAGNOSIS — T66XXXS Radiation sickness, unspecified, sequela: Secondary | ICD-10-CM | POA: Diagnosis not present

## 2014-06-18 DIAGNOSIS — C4441 Basal cell carcinoma of skin of scalp and neck: Secondary | ICD-10-CM | POA: Diagnosis not present

## 2014-06-18 NOTE — Telephone Encounter (Signed)
Called patient to address his question re: fluoride rinse.  I directed him to call Dr. [name].  Gayleen Orem, RN, BSN, [place] at [place] [phone]

## 2014-06-19 ENCOUNTER — Ambulatory Visit
Admission: RE | Admit: 2014-06-19 | Discharge: 2014-06-19 | Disposition: A | Discharge status: Home / Self Care | Payer: Medicare Other | Source: Ambulatory Visit | Attending: Radiation Oncology | Admitting: Radiation Oncology

## 2014-06-19 DIAGNOSIS — Z6826 Body mass index (BMI) 26.0-26.9, adult: Secondary | ICD-10-CM | POA: Diagnosis not present

## 2014-06-19 DIAGNOSIS — Z9049 Acquired absence of other specified parts of digestive tract: Secondary | ICD-10-CM | POA: Diagnosis not present

## 2014-06-19 DIAGNOSIS — C4441 Basal cell carcinoma of skin of scalp and neck: Secondary | ICD-10-CM | POA: Diagnosis not present

## 2014-06-19 DIAGNOSIS — K5900 Constipation, unspecified: Secondary | ICD-10-CM | POA: Diagnosis not present

## 2014-06-19 DIAGNOSIS — Z510 Encounter for antineoplastic radiation therapy: Secondary | ICD-10-CM | POA: Diagnosis not present

## 2014-06-19 DIAGNOSIS — E785 Hyperlipidemia, unspecified: Secondary | ICD-10-CM | POA: Diagnosis not present

## 2014-06-19 DIAGNOSIS — L599 Disorder of the skin and subcutaneous tissue related to radiation, unspecified: Secondary | ICD-10-CM | POA: Diagnosis not present

## 2014-06-19 DIAGNOSIS — Z79899 Other long term (current) drug therapy: Secondary | ICD-10-CM | POA: Diagnosis not present

## 2014-06-19 DIAGNOSIS — K1233 Oral mucositis (ulcerative) due to radiation: Secondary | ICD-10-CM | POA: Diagnosis not present

## 2014-06-19 DIAGNOSIS — G510 Bell's palsy: Secondary | ICD-10-CM | POA: Diagnosis not present

## 2014-06-19 DIAGNOSIS — R070 Pain in throat: Secondary | ICD-10-CM | POA: Diagnosis not present

## 2014-06-19 DIAGNOSIS — Z791 Long term (current) use of non-steroidal anti-inflammatories (NSAID): Secondary | ICD-10-CM | POA: Diagnosis not present

## 2014-06-19 DIAGNOSIS — Z7982 Long term (current) use of aspirin: Secondary | ICD-10-CM | POA: Diagnosis not present

## 2014-06-19 DIAGNOSIS — I951 Orthostatic hypotension: Secondary | ICD-10-CM | POA: Diagnosis not present

## 2014-06-19 DIAGNOSIS — C7989 Secondary malignant neoplasm of other specified sites: Secondary | ICD-10-CM | POA: Diagnosis not present

## 2014-06-19 DIAGNOSIS — Z85828 Personal history of other malignant neoplasm of skin: Secondary | ICD-10-CM | POA: Diagnosis not present

## 2014-06-19 DIAGNOSIS — C4442 Squamous cell carcinoma of skin of scalp and neck: Secondary | ICD-10-CM | POA: Diagnosis not present

## 2014-06-19 DIAGNOSIS — K219 Gastro-esophageal reflux disease without esophagitis: Secondary | ICD-10-CM | POA: Diagnosis not present

## 2014-06-19 DIAGNOSIS — I10 Essential (primary) hypertension: Secondary | ICD-10-CM | POA: Diagnosis not present

## 2014-06-19 DIAGNOSIS — R1310 Dysphagia, unspecified: Secondary | ICD-10-CM | POA: Diagnosis not present

## 2014-06-19 DIAGNOSIS — R634 Abnormal weight loss: Secondary | ICD-10-CM | POA: Diagnosis not present

## 2014-06-19 DIAGNOSIS — Z952 Presence of prosthetic heart valve: Secondary | ICD-10-CM | POA: Diagnosis not present

## 2014-06-19 DIAGNOSIS — K117 Disturbances of salivary secretion: Secondary | ICD-10-CM | POA: Diagnosis not present

## 2014-06-19 DIAGNOSIS — E039 Hypothyroidism, unspecified: Secondary | ICD-10-CM | POA: Diagnosis not present

## 2014-06-19 DIAGNOSIS — R5383 Other fatigue: Secondary | ICD-10-CM | POA: Diagnosis not present

## 2014-06-19 DIAGNOSIS — T66XXXS Radiation sickness, unspecified, sequela: Secondary | ICD-10-CM | POA: Diagnosis not present

## 2014-06-20 ENCOUNTER — Encounter: Payer: Self-pay | Admitting: *Deleted

## 2014-06-20 ENCOUNTER — Ambulatory Visit
Admission: RE | Admit: 2014-06-20 | Discharge: 2014-06-20 | Disposition: A | Discharge status: Home / Self Care | Payer: Medicare Other | Source: Ambulatory Visit | Attending: Radiation Oncology | Admitting: Radiation Oncology

## 2014-06-20 DIAGNOSIS — Z85828 Personal history of other malignant neoplasm of skin: Secondary | ICD-10-CM | POA: Diagnosis not present

## 2014-06-20 DIAGNOSIS — I951 Orthostatic hypotension: Secondary | ICD-10-CM | POA: Diagnosis not present

## 2014-06-20 DIAGNOSIS — C4441 Basal cell carcinoma of skin of scalp and neck: Secondary | ICD-10-CM | POA: Diagnosis not present

## 2014-06-20 DIAGNOSIS — Z6826 Body mass index (BMI) 26.0-26.9, adult: Secondary | ICD-10-CM | POA: Diagnosis not present

## 2014-06-20 DIAGNOSIS — Z510 Encounter for antineoplastic radiation therapy: Secondary | ICD-10-CM | POA: Diagnosis not present

## 2014-06-20 DIAGNOSIS — C4442 Squamous cell carcinoma of skin of scalp and neck: Secondary | ICD-10-CM | POA: Diagnosis not present

## 2014-06-20 DIAGNOSIS — R634 Abnormal weight loss: Secondary | ICD-10-CM | POA: Diagnosis not present

## 2014-06-20 DIAGNOSIS — Z791 Long term (current) use of non-steroidal anti-inflammatories (NSAID): Secondary | ICD-10-CM | POA: Diagnosis not present

## 2014-06-20 DIAGNOSIS — K5900 Constipation, unspecified: Secondary | ICD-10-CM | POA: Diagnosis not present

## 2014-06-20 DIAGNOSIS — Z952 Presence of prosthetic heart valve: Secondary | ICD-10-CM | POA: Diagnosis not present

## 2014-06-20 DIAGNOSIS — G510 Bell's palsy: Secondary | ICD-10-CM | POA: Diagnosis not present

## 2014-06-20 DIAGNOSIS — E039 Hypothyroidism, unspecified: Secondary | ICD-10-CM | POA: Diagnosis not present

## 2014-06-20 DIAGNOSIS — L599 Disorder of the skin and subcutaneous tissue related to radiation, unspecified: Secondary | ICD-10-CM | POA: Diagnosis not present

## 2014-06-20 DIAGNOSIS — Z7982 Long term (current) use of aspirin: Secondary | ICD-10-CM | POA: Diagnosis not present

## 2014-06-20 DIAGNOSIS — R070 Pain in throat: Secondary | ICD-10-CM | POA: Diagnosis not present

## 2014-06-20 DIAGNOSIS — K219 Gastro-esophageal reflux disease without esophagitis: Secondary | ICD-10-CM | POA: Diagnosis not present

## 2014-06-20 DIAGNOSIS — R5383 Other fatigue: Secondary | ICD-10-CM | POA: Diagnosis not present

## 2014-06-20 DIAGNOSIS — C7989 Secondary malignant neoplasm of other specified sites: Secondary | ICD-10-CM | POA: Diagnosis not present

## 2014-06-20 DIAGNOSIS — K1233 Oral mucositis (ulcerative) due to radiation: Secondary | ICD-10-CM | POA: Diagnosis not present

## 2014-06-20 DIAGNOSIS — T66XXXS Radiation sickness, unspecified, sequela: Secondary | ICD-10-CM | POA: Diagnosis not present

## 2014-06-20 DIAGNOSIS — R1310 Dysphagia, unspecified: Secondary | ICD-10-CM | POA: Diagnosis not present

## 2014-06-20 DIAGNOSIS — Z79899 Other long term (current) drug therapy: Secondary | ICD-10-CM | POA: Diagnosis not present

## 2014-06-20 DIAGNOSIS — K117 Disturbances of salivary secretion: Secondary | ICD-10-CM | POA: Diagnosis not present

## 2014-06-20 DIAGNOSIS — Z9049 Acquired absence of other specified parts of digestive tract: Secondary | ICD-10-CM | POA: Diagnosis not present

## 2014-06-20 DIAGNOSIS — I10 Essential (primary) hypertension: Secondary | ICD-10-CM | POA: Diagnosis not present

## 2014-06-20 DIAGNOSIS — E785 Hyperlipidemia, unspecified: Secondary | ICD-10-CM | POA: Diagnosis not present

## 2014-06-22 NOTE — Progress Notes (Signed)
To provide support and encouragement, care continuity and to assess for needs, met with patient prior to RT.    He reported that tmts are going well.  I observed him conducting stretching exercises prior to reclining on [place] table.  He stated this helps to relax back muscles, mitigates chronic back pain while being treated. He did not express any needs or concerns at this time, I encouraged him to contact me if that changes, he verbalized understanding.  Gayleen Orem, RN, BSN, [place] at [place] [phone]

## 2014-06-23 ENCOUNTER — Ambulatory Visit
Admission: RE | Admit: 2014-06-23 | Discharge: 2014-06-23 | Disposition: A | Discharge status: Home / Self Care | Payer: Medicare Other | Source: Ambulatory Visit | Attending: Radiation Oncology | Admitting: Radiation Oncology

## 2014-06-23 ENCOUNTER — Encounter: Payer: Self-pay | Admitting: Radiation Oncology

## 2014-06-23 VITALS — BP 119/71 | HR 55 | Temp 97.6°F | Resp 20 | Wt 184.3 lb

## 2014-06-23 DIAGNOSIS — Z85828 Personal history of other malignant neoplasm of skin: Secondary | ICD-10-CM | POA: Diagnosis not present

## 2014-06-23 DIAGNOSIS — L599 Disorder of the skin and subcutaneous tissue related to radiation, unspecified: Secondary | ICD-10-CM | POA: Diagnosis not present

## 2014-06-23 DIAGNOSIS — I10 Essential (primary) hypertension: Secondary | ICD-10-CM | POA: Diagnosis not present

## 2014-06-23 DIAGNOSIS — Z9049 Acquired absence of other specified parts of digestive tract: Secondary | ICD-10-CM | POA: Diagnosis not present

## 2014-06-23 DIAGNOSIS — I951 Orthostatic hypotension: Secondary | ICD-10-CM | POA: Diagnosis not present

## 2014-06-23 DIAGNOSIS — G510 Bell's palsy: Secondary | ICD-10-CM | POA: Diagnosis not present

## 2014-06-23 DIAGNOSIS — T66XXXS Radiation sickness, unspecified, sequela: Secondary | ICD-10-CM | POA: Diagnosis not present

## 2014-06-23 DIAGNOSIS — Z791 Long term (current) use of non-steroidal anti-inflammatories (NSAID): Secondary | ICD-10-CM | POA: Diagnosis not present

## 2014-06-23 DIAGNOSIS — C7989 Secondary malignant neoplasm of other specified sites: Secondary | ICD-10-CM | POA: Diagnosis not present

## 2014-06-23 DIAGNOSIS — Z510 Encounter for antineoplastic radiation therapy: Secondary | ICD-10-CM | POA: Diagnosis not present

## 2014-06-23 DIAGNOSIS — Z7982 Long term (current) use of aspirin: Secondary | ICD-10-CM | POA: Diagnosis not present

## 2014-06-23 DIAGNOSIS — K5900 Constipation, unspecified: Secondary | ICD-10-CM | POA: Diagnosis not present

## 2014-06-23 DIAGNOSIS — E785 Hyperlipidemia, unspecified: Secondary | ICD-10-CM | POA: Diagnosis not present

## 2014-06-23 DIAGNOSIS — C4442 Squamous cell carcinoma of skin of scalp and neck: Secondary | ICD-10-CM | POA: Diagnosis not present

## 2014-06-23 DIAGNOSIS — Z6826 Body mass index (BMI) 26.0-26.9, adult: Secondary | ICD-10-CM | POA: Diagnosis not present

## 2014-06-23 DIAGNOSIS — R5383 Other fatigue: Secondary | ICD-10-CM | POA: Diagnosis not present

## 2014-06-23 DIAGNOSIS — C4441 Basal cell carcinoma of skin of scalp and neck: Secondary | ICD-10-CM | POA: Diagnosis not present

## 2014-06-23 DIAGNOSIS — K117 Disturbances of salivary secretion: Secondary | ICD-10-CM | POA: Diagnosis not present

## 2014-06-23 DIAGNOSIS — K1233 Oral mucositis (ulcerative) due to radiation: Secondary | ICD-10-CM | POA: Diagnosis not present

## 2014-06-23 DIAGNOSIS — E039 Hypothyroidism, unspecified: Secondary | ICD-10-CM | POA: Diagnosis not present

## 2014-06-23 DIAGNOSIS — Z79899 Other long term (current) drug therapy: Secondary | ICD-10-CM | POA: Diagnosis not present

## 2014-06-23 DIAGNOSIS — R1310 Dysphagia, unspecified: Secondary | ICD-10-CM | POA: Diagnosis not present

## 2014-06-23 DIAGNOSIS — K219 Gastro-esophageal reflux disease without esophagitis: Secondary | ICD-10-CM | POA: Diagnosis not present

## 2014-06-23 DIAGNOSIS — C4440 Unspecified malignant neoplasm of skin of scalp and neck: Secondary | ICD-10-CM

## 2014-06-23 DIAGNOSIS — R634 Abnormal weight loss: Secondary | ICD-10-CM | POA: Diagnosis not present

## 2014-06-23 DIAGNOSIS — Z952 Presence of prosthetic heart valve: Secondary | ICD-10-CM | POA: Diagnosis not present

## 2014-06-23 DIAGNOSIS — R070 Pain in throat: Secondary | ICD-10-CM | POA: Diagnosis not present

## 2014-06-23 MED ORDER — HYDROCODONE-ACETAMINOPHEN 7.5-325 MG/15ML PO SOLN: 15.0000 mL | ORAL | Status: DC | PRN

## 2014-06-23 MED ORDER — LIDOCAINE VISCOUS 2 % MT SOLN: OROMUCOSAL | Status: DC

## 2014-06-23 NOTE — Progress Notes (Signed)
Weekly Management Note:  Outpatient    ICD-9-CM ICD-10-CM   1. Skin cancer of scalp or skin of neck 173.41 C44.41     Current Dose:  18Gy  Projected Dose: 66 Gy   Narrative:  The patient presents for routine under treatment assessment.  CBCT/MVCT images/Port film x-rays were reviewed.  The chart was checked. Patient reports 3 days of "severe pain in the roof of my mouth, a sore throat and the end of my tongue is sensitive". He has had difficulty eating, swallowing and lost 3 lbs in past week.   Patient has not taken any medication for this pain, states he used Biotene this morning with some relief.  Pt has not begun applying Biafine to neck. Nursing Requested Adrian West try to move up his appointment to see dietician.   He is applying fluoride liberally to his mouth trays - unable to apply in droplet form.   Physical Findings:    Vitals with BMI 06/23/2014 06/16/2014 05/28/2014 05/26/2014  Height      Weight 184 lbs 5 oz 187 lbs 6 oz    BMI      Systolic 010 272 536 644  Diastolic 71 56 48 54  Pulse 55 50 61 61  Respirations 20 22     Vitals with BMI 05/26/2014 05/21/2014 05/05/2014  Height  5\' 10"  5\' 10"   Weight  185 lbs 184 lbs 10 oz  BMI  03.4 74.2  Systolic 595 638 756  Diastolic 72 64 72  Pulse 62 69 66  Respirations   22       weight is 184 lb 4.8 oz (83.598 kg). His oral temperature is 97.6 F (36.4 C). His blood pressure is 119/71 and his pulse is 55. His respiration is 20. oral mucosa is erythematous, particularly over hard palate. No thrush or mucositis.  CBC    Component Value Date/Time   WBC 7.2 03/31/2014 1140   WBC 5.2 04/17/2013 0959   RBC 4.79 03/31/2014 1140   RBC 5.51 04/17/2013 0959   HGB 15.0 03/31/2014 1140   HGB 17.5* 04/17/2013 0959   HCT 44.9 03/31/2014 1140   HCT 51.8* 04/17/2013 0959   PLT 326 03/31/2014 1140   PLT 197 04/17/2013 0959   MCV 93.7 03/31/2014 1140   MCV 94.0 04/17/2013 0959   MCH 31.3 03/31/2014 1140   MCH 31.7 04/17/2013 0959    MCHC 33.4 03/31/2014 1140   MCHC 33.8 04/17/2013 0959   RDW 13.0 03/31/2014 1140   RDW 13.1 04/17/2013 0959   LYMPHSABS 0.8 03/31/2014 1140   LYMPHSABS 1.0 04/17/2013 0959   MONOABS 0.3 03/31/2014 1140   MONOABS 0.4 04/17/2013 0959   EOSABS 0.1 03/31/2014 1140   EOSABS 0.1 04/17/2013 0959   BASOSABS 0.0 03/31/2014 1140   BASOSABS 0.0 04/17/2013 0959     CMP     Component Value Date/Time   NA 139 03/03/2014 1020   K 4.5 03/03/2014 1020   CL 103 03/03/2014 1020   CO2 27 03/03/2014 1020   GLUCOSE 98 03/03/2014 1020   BUN 14 03/03/2014 1020   CREATININE 0.90 03/03/2014 1020   CREATININE 1.20 10/12/2010 1257   CALCIUM 8.7 03/03/2014 1020   PROT 5.6* 10/22/2013 1218   ALBUMIN 3.0* 10/22/2013 1218   AST 21 10/22/2013 1218   ALT 23 10/22/2013 1218   ALKPHOS 60 10/22/2013 1218   BILITOT 0.5 10/22/2013 1218   GFRNONAA 76* 03/03/2014 1020   GFRAA 88* 03/03/2014 1020   Lab Results  Component Value Date   TSH 4.59* 02/13/2014     Impression:  The patient is tolerating radiotherapy.   Plan:  Continue radiotherapy as planned.  1) refer to nutrition (weight loss) 2) obtain labs this week in light of poor PO and weight loss 3) pt advised to stop fluoride tx's until he sees Dr Adrian West next week. He is to bring his fluoride and trays with him to make sure he learns to administer the fluoride properly.  I am wondering if this is contributing to his oral irritation because it is not typical for patients to have so much pain this early. 4) Rx'd lidocaine for MW - instructed him on how to use this with H20 for dilution 5) Rx'd Hycet for pain.  -----------------------------------  [name], MD

## 2014-06-23 NOTE — Progress Notes (Addendum)
Patient reports 3 days of "severe pain in the roof of my mouth, a sore throat and the end of my tongue is sensitive". He has had difficulty eating, swallowing and lost 3 lbs in past week. No sign of thrush noted in roof of mouth or on tongue, no redness of roof of mouth. Patient has not taken any medication for this pain, states he used Biotene this morning with some relief. He states he takes generic Unisom nightly ; suggested this medication may be drying out his mucus membranes. Pt has not begun applying Biafine to neck; advised he begin twice daily. Pt verbalized understanding. Requested Verdell Carmine try to move up his appointment to see dietician.

## 2014-06-24 ENCOUNTER — Ambulatory Visit
Admission: RE | Admit: 2014-06-24 | Discharge: 2014-06-24 | Disposition: A | Discharge status: Home / Self Care | Payer: Medicare Other | Source: Ambulatory Visit | Attending: Radiation Oncology | Admitting: Radiation Oncology

## 2014-06-24 ENCOUNTER — Telehealth: Payer: Self-pay | Admitting: *Deleted

## 2014-06-24 DIAGNOSIS — E785 Hyperlipidemia, unspecified: Secondary | ICD-10-CM | POA: Diagnosis not present

## 2014-06-24 DIAGNOSIS — R634 Abnormal weight loss: Secondary | ICD-10-CM | POA: Diagnosis not present

## 2014-06-24 DIAGNOSIS — Z791 Long term (current) use of non-steroidal anti-inflammatories (NSAID): Secondary | ICD-10-CM | POA: Diagnosis not present

## 2014-06-24 DIAGNOSIS — E039 Hypothyroidism, unspecified: Secondary | ICD-10-CM | POA: Diagnosis not present

## 2014-06-24 DIAGNOSIS — Z6826 Body mass index (BMI) 26.0-26.9, adult: Secondary | ICD-10-CM | POA: Diagnosis not present

## 2014-06-24 DIAGNOSIS — G510 Bell's palsy: Secondary | ICD-10-CM | POA: Diagnosis not present

## 2014-06-24 DIAGNOSIS — Z85828 Personal history of other malignant neoplasm of skin: Secondary | ICD-10-CM | POA: Diagnosis not present

## 2014-06-24 DIAGNOSIS — K1233 Oral mucositis (ulcerative) due to radiation: Secondary | ICD-10-CM | POA: Diagnosis not present

## 2014-06-24 DIAGNOSIS — C4440 Unspecified malignant neoplasm of skin of scalp and neck: Secondary | ICD-10-CM

## 2014-06-24 DIAGNOSIS — Z7982 Long term (current) use of aspirin: Secondary | ICD-10-CM | POA: Diagnosis not present

## 2014-06-24 DIAGNOSIS — K219 Gastro-esophageal reflux disease without esophagitis: Secondary | ICD-10-CM | POA: Diagnosis not present

## 2014-06-24 DIAGNOSIS — K5900 Constipation, unspecified: Secondary | ICD-10-CM | POA: Diagnosis not present

## 2014-06-24 DIAGNOSIS — K117 Disturbances of salivary secretion: Secondary | ICD-10-CM | POA: Diagnosis not present

## 2014-06-24 DIAGNOSIS — C4442 Squamous cell carcinoma of skin of scalp and neck: Secondary | ICD-10-CM | POA: Diagnosis not present

## 2014-06-24 DIAGNOSIS — L599 Disorder of the skin and subcutaneous tissue related to radiation, unspecified: Secondary | ICD-10-CM | POA: Diagnosis not present

## 2014-06-24 DIAGNOSIS — C7989 Secondary malignant neoplasm of other specified sites: Secondary | ICD-10-CM | POA: Diagnosis not present

## 2014-06-24 DIAGNOSIS — C4441 Basal cell carcinoma of skin of scalp and neck: Secondary | ICD-10-CM | POA: Diagnosis not present

## 2014-06-24 DIAGNOSIS — Z510 Encounter for antineoplastic radiation therapy: Secondary | ICD-10-CM | POA: Diagnosis not present

## 2014-06-24 DIAGNOSIS — Z952 Presence of prosthetic heart valve: Secondary | ICD-10-CM | POA: Diagnosis not present

## 2014-06-24 DIAGNOSIS — I951 Orthostatic hypotension: Secondary | ICD-10-CM | POA: Diagnosis not present

## 2014-06-24 DIAGNOSIS — Z9049 Acquired absence of other specified parts of digestive tract: Secondary | ICD-10-CM | POA: Diagnosis not present

## 2014-06-24 DIAGNOSIS — Z79899 Other long term (current) drug therapy: Secondary | ICD-10-CM | POA: Diagnosis not present

## 2014-06-24 DIAGNOSIS — R070 Pain in throat: Secondary | ICD-10-CM | POA: Diagnosis not present

## 2014-06-24 DIAGNOSIS — I10 Essential (primary) hypertension: Secondary | ICD-10-CM | POA: Diagnosis not present

## 2014-06-24 DIAGNOSIS — R1310 Dysphagia, unspecified: Secondary | ICD-10-CM | POA: Diagnosis not present

## 2014-06-24 DIAGNOSIS — T66XXXS Radiation sickness, unspecified, sequela: Secondary | ICD-10-CM | POA: Diagnosis not present

## 2014-06-24 DIAGNOSIS — R5383 Other fatigue: Secondary | ICD-10-CM | POA: Diagnosis not present

## 2014-06-24 LAB — CBC WITH DIFFERENTIAL/PLATELET
BASO%: 0.5 % (ref 0.0–2.0)
Basophils Absolute: 0 10*3/uL (ref 0.0–0.1)
EOS ABS: 0.2 10*3/uL (ref 0.0–0.5)
EOS%: 4.3 % (ref 0.0–7.0)
HEMATOCRIT: 42.9 % (ref 38.4–49.9)
HEMOGLOBIN: 13.6 g/dL (ref 13.0–17.1)
LYMPH%: 13 % — AB (ref 14.0–49.0)
MCH: 28.2 pg (ref 27.2–33.4)
MCHC: 31.7 g/dL — AB (ref 32.0–36.0)
MCV: 88.8 fL (ref 79.3–98.0)
MONO#: 0.4 10*3/uL (ref 0.1–0.9)
MONO%: 9.9 % (ref 0.0–14.0)
NEUT#: 3.1 10*3/uL (ref 1.5–6.5)
NEUT%: 72.3 % (ref 39.0–75.0)
PLATELETS: 231 10*3/uL (ref 140–400)
RBC: 4.83 10*6/uL (ref 4.20–5.82)
RDW: 13.5 % (ref 11.0–14.6)
WBC: 4.2 10*3/uL (ref 4.0–10.3)
lymph#: 0.6 10*3/uL — ABNORMAL LOW (ref 0.9–3.3)

## 2014-06-24 LAB — BASIC METABOLIC PANEL (CC13)
ANION GAP: 7 meq/L (ref 3–11)
BUN: 14.2 mg/dL (ref 7.0–26.0)
CO2: 28 mEq/L (ref 22–29)
Calcium: 9.1 mg/dL (ref 8.4–10.4)
Chloride: 106 mEq/L (ref 98–109)
Creatinine: 0.9 mg/dL (ref 0.7–1.3)
EGFR: 78 mL/min/{1.73_m2} — ABNORMAL LOW (ref >90)
GLUCOSE: 85 mg/dL (ref 70–140)
Potassium: 4.8 mEq/L (ref 3.5–5.1)
SODIUM: 141 meq/L (ref 136–145)

## 2014-06-24 NOTE — Telephone Encounter (Signed)
Per Dr Isidore Moos, called patient and left vm re: "your labs today look fine. They will serve as a baseline in the event you need IVF in the future". Left this RN's name and call back number, advised he call back with any questions.

## 2014-06-24 NOTE — Progress Notes (Signed)
Spoke with patient prior to his radiation treatment today re: pain improved with new medications. Patient states he began taking Hycet, Lidocaine yesterday but has not noticed much relief of his mouth/throat pain. He has taken Hycet x 2 in past 24 hours, has "tried' Lidocaine x 3. Encouraged him to continue taking these medications regularly. Informed patient Dr Isidore Moos has scheduled him for labs today, so he is to go to first floor to register following his treatment today. Pt verbalized understanding of above.

## 2014-06-25 ENCOUNTER — Ambulatory Visit
Admission: RE | Admit: 2014-06-25 | Discharge: 2014-06-25 | Disposition: A | Discharge status: Home / Self Care | Payer: Medicare Other | Source: Ambulatory Visit | Attending: Radiation Oncology | Admitting: Radiation Oncology

## 2014-06-25 ENCOUNTER — Encounter: Payer: Self-pay | Admitting: *Deleted

## 2014-06-25 ENCOUNTER — Other Ambulatory Visit: Payer: Self-pay | Admitting: Radiation Oncology

## 2014-06-25 DIAGNOSIS — C7989 Secondary malignant neoplasm of other specified sites: Secondary | ICD-10-CM | POA: Diagnosis not present

## 2014-06-25 DIAGNOSIS — Z510 Encounter for antineoplastic radiation therapy: Secondary | ICD-10-CM | POA: Diagnosis not present

## 2014-06-25 DIAGNOSIS — K1233 Oral mucositis (ulcerative) due to radiation: Secondary | ICD-10-CM | POA: Diagnosis not present

## 2014-06-25 DIAGNOSIS — I951 Orthostatic hypotension: Secondary | ICD-10-CM | POA: Diagnosis not present

## 2014-06-25 DIAGNOSIS — Z9049 Acquired absence of other specified parts of digestive tract: Secondary | ICD-10-CM | POA: Diagnosis not present

## 2014-06-25 DIAGNOSIS — G510 Bell's palsy: Secondary | ICD-10-CM | POA: Diagnosis not present

## 2014-06-25 DIAGNOSIS — T66XXXS Radiation sickness, unspecified, sequela: Secondary | ICD-10-CM | POA: Diagnosis not present

## 2014-06-25 DIAGNOSIS — Z85828 Personal history of other malignant neoplasm of skin: Secondary | ICD-10-CM | POA: Diagnosis not present

## 2014-06-25 DIAGNOSIS — I10 Essential (primary) hypertension: Secondary | ICD-10-CM | POA: Diagnosis not present

## 2014-06-25 DIAGNOSIS — E039 Hypothyroidism, unspecified: Secondary | ICD-10-CM | POA: Diagnosis not present

## 2014-06-25 DIAGNOSIS — K5900 Constipation, unspecified: Secondary | ICD-10-CM | POA: Diagnosis not present

## 2014-06-25 DIAGNOSIS — R1310 Dysphagia, unspecified: Secondary | ICD-10-CM | POA: Diagnosis not present

## 2014-06-25 DIAGNOSIS — E785 Hyperlipidemia, unspecified: Secondary | ICD-10-CM | POA: Diagnosis not present

## 2014-06-25 DIAGNOSIS — Z79899 Other long term (current) drug therapy: Secondary | ICD-10-CM | POA: Diagnosis not present

## 2014-06-25 DIAGNOSIS — K117 Disturbances of salivary secretion: Secondary | ICD-10-CM | POA: Diagnosis not present

## 2014-06-25 DIAGNOSIS — R070 Pain in throat: Secondary | ICD-10-CM | POA: Diagnosis not present

## 2014-06-25 DIAGNOSIS — Z7982 Long term (current) use of aspirin: Secondary | ICD-10-CM | POA: Diagnosis not present

## 2014-06-25 DIAGNOSIS — C4440 Unspecified malignant neoplasm of skin of scalp and neck: Secondary | ICD-10-CM

## 2014-06-25 DIAGNOSIS — L599 Disorder of the skin and subcutaneous tissue related to radiation, unspecified: Secondary | ICD-10-CM | POA: Diagnosis not present

## 2014-06-25 DIAGNOSIS — R5383 Other fatigue: Secondary | ICD-10-CM | POA: Diagnosis not present

## 2014-06-25 DIAGNOSIS — C4441 Basal cell carcinoma of skin of scalp and neck: Secondary | ICD-10-CM | POA: Diagnosis not present

## 2014-06-25 DIAGNOSIS — K219 Gastro-esophageal reflux disease without esophagitis: Secondary | ICD-10-CM | POA: Diagnosis not present

## 2014-06-25 DIAGNOSIS — C4442 Squamous cell carcinoma of skin of scalp and neck: Secondary | ICD-10-CM | POA: Diagnosis not present

## 2014-06-25 DIAGNOSIS — Z791 Long term (current) use of non-steroidal anti-inflammatories (NSAID): Secondary | ICD-10-CM | POA: Diagnosis not present

## 2014-06-25 DIAGNOSIS — Z952 Presence of prosthetic heart valve: Secondary | ICD-10-CM | POA: Diagnosis not present

## 2014-06-25 DIAGNOSIS — Z6826 Body mass index (BMI) 26.0-26.9, adult: Secondary | ICD-10-CM | POA: Diagnosis not present

## 2014-06-25 DIAGNOSIS — R634 Abnormal weight loss: Secondary | ICD-10-CM | POA: Diagnosis not present

## 2014-06-25 MED ORDER — HYDROCODONE-ACETAMINOPHEN 7.5-325 MG PO TABS: 1.0000 | ORAL_TABLET | ORAL | Status: DC | PRN

## 2014-06-25 MED ORDER — SENNA 8.6 MG PO TABS: 1.0000 | ORAL_TABLET | Freq: Every day | ORAL | Status: DC

## 2014-06-25 MED ORDER — FENTANYL 25 MCG/HR TD PT72: 25.0000 ug | MEDICATED_PATCH | TRANSDERMAL | Status: DC

## 2014-06-25 NOTE — Progress Notes (Signed)
   Weekly Management Note:  outpatient    ICD-9-CM ICD-10-CM   1. Skin cancer of scalp or skin of neck 173.41 C44.41     Current Dose:  22 Gy  Projected Dose: 66 Gy   Narrative:  The patient presents for routine under treatment assessment.  CBCT/MVCT images/Port film x-rays were reviewed.  The chart was checked. Pain still not alleviated by Hycet (which burns) and lidocaine. Poor PO intake due to swallowing pain.  Physical Findings:  Vitals with Age-Percentiles 06/25/2014 06/25/2014 06/25/2014 06/23/2014  Length      Systolic 153 794 327 614  Diastolic 67 71 71 71  Pulse 76 72 72 55  Respiration    20  Weight  83.416 kg  83.598 kg  BMI      VISIT REPORT       Vitals with Age-Percentiles 12/13/2955  Length   Systolic 473  Diastolic 56  Pulse 50  Respiration 22  Weight 85.004 kg  BMI   VISIT REPORT    Oral mucosa erythematous, no thrush  CBC    Component Value Date/Time   WBC 4.2 06/24/2014 1019   WBC 7.2 03/31/2014 1140   RBC 4.83 06/24/2014 1019   RBC 4.79 03/31/2014 1140   HGB 13.6 06/24/2014 1019   HGB 15.0 03/31/2014 1140   HCT 42.9 06/24/2014 1019   HCT 44.9 03/31/2014 1140   PLT 231 06/24/2014 1019   PLT 326 03/31/2014 1140   MCV 88.8 06/24/2014 1019   MCV 93.7 03/31/2014 1140   MCH 28.2 06/24/2014 1019   MCH 31.3 03/31/2014 1140   MCHC 31.7* 06/24/2014 1019   MCHC 33.4 03/31/2014 1140   RDW 13.5 06/24/2014 1019   RDW 13.0 03/31/2014 1140   LYMPHSABS 0.6* 06/24/2014 1019   LYMPHSABS 0.8 03/31/2014 1140   MONOABS 0.4 06/24/2014 1019   MONOABS 0.3 03/31/2014 1140   EOSABS 0.2 06/24/2014 1019   EOSABS 0.1 03/31/2014 1140   BASOSABS 0.0 06/24/2014 1019   BASOSABS 0.0 03/31/2014 1140     CMP     Component Value Date/Time   NA 141 06/24/2014 1019   NA 139 03/03/2014 1020   K 4.8 06/24/2014 1019   K 4.5 03/03/2014 1020   CL 103 03/03/2014 1020   CO2 28 06/24/2014 1019   CO2 27 03/03/2014 1020   GLUCOSE 85 06/24/2014 1019   GLUCOSE 98 03/03/2014  1020   BUN 14.2 06/24/2014 1019   BUN 14 03/03/2014 1020   CREATININE 0.9 06/24/2014 1019   CREATININE 0.90 03/03/2014 1020   CREATININE 1.20 10/12/2010 1257   CALCIUM 9.1 06/24/2014 1019   CALCIUM 8.7 03/03/2014 1020   PROT 5.6* 10/22/2013 1218   ALBUMIN 3.0* 10/22/2013 1218   AST 21 10/22/2013 1218   ALT 23 10/22/2013 1218   ALKPHOS 60 10/22/2013 1218   BILITOT 0.5 10/22/2013 1218   GFRNONAA 76* 03/03/2014 1020   GFRAA 88* 03/03/2014 1020     Impression:  The patient is tolerating radiotherapy with pain.   Plan:  Continue radiotherapy as planned. Thus we are replacing hycet with pills. Start fentanyl for long acting pain coverage.  Warned this can affect his judgement, bowel regularity, orientation. Senna for bowel regularity.  Push PO as tolerated. Labs and vitals reassuring.  -----------------------------------  [name], MD

## 2014-06-26 ENCOUNTER — Telehealth: Payer: Self-pay | Admitting: *Deleted

## 2014-06-26 ENCOUNTER — Ambulatory Visit
Admission: RE | Admit: 2014-06-26 | Discharge: 2014-06-26 | Disposition: A | Discharge status: Home / Self Care | Payer: Medicare Other | Source: Ambulatory Visit | Attending: Radiation Oncology | Admitting: Radiation Oncology

## 2014-06-26 DIAGNOSIS — L599 Disorder of the skin and subcutaneous tissue related to radiation, unspecified: Secondary | ICD-10-CM | POA: Diagnosis not present

## 2014-06-26 DIAGNOSIS — K117 Disturbances of salivary secretion: Secondary | ICD-10-CM | POA: Diagnosis not present

## 2014-06-26 DIAGNOSIS — Z6826 Body mass index (BMI) 26.0-26.9, adult: Secondary | ICD-10-CM | POA: Diagnosis not present

## 2014-06-26 DIAGNOSIS — Z791 Long term (current) use of non-steroidal anti-inflammatories (NSAID): Secondary | ICD-10-CM | POA: Diagnosis not present

## 2014-06-26 DIAGNOSIS — E039 Hypothyroidism, unspecified: Secondary | ICD-10-CM | POA: Diagnosis not present

## 2014-06-26 DIAGNOSIS — C4441 Basal cell carcinoma of skin of scalp and neck: Secondary | ICD-10-CM | POA: Diagnosis not present

## 2014-06-26 DIAGNOSIS — R634 Abnormal weight loss: Secondary | ICD-10-CM | POA: Diagnosis not present

## 2014-06-26 DIAGNOSIS — T66XXXS Radiation sickness, unspecified, sequela: Secondary | ICD-10-CM | POA: Diagnosis not present

## 2014-06-26 DIAGNOSIS — I10 Essential (primary) hypertension: Secondary | ICD-10-CM | POA: Diagnosis not present

## 2014-06-26 DIAGNOSIS — Z79899 Other long term (current) drug therapy: Secondary | ICD-10-CM | POA: Diagnosis not present

## 2014-06-26 DIAGNOSIS — G510 Bell's palsy: Secondary | ICD-10-CM | POA: Diagnosis not present

## 2014-06-26 DIAGNOSIS — K1233 Oral mucositis (ulcerative) due to radiation: Secondary | ICD-10-CM | POA: Diagnosis not present

## 2014-06-26 DIAGNOSIS — I951 Orthostatic hypotension: Secondary | ICD-10-CM | POA: Diagnosis not present

## 2014-06-26 DIAGNOSIS — Z510 Encounter for antineoplastic radiation therapy: Secondary | ICD-10-CM | POA: Diagnosis not present

## 2014-06-26 DIAGNOSIS — R5383 Other fatigue: Secondary | ICD-10-CM | POA: Diagnosis not present

## 2014-06-26 DIAGNOSIS — Z7982 Long term (current) use of aspirin: Secondary | ICD-10-CM | POA: Diagnosis not present

## 2014-06-26 DIAGNOSIS — Z9049 Acquired absence of other specified parts of digestive tract: Secondary | ICD-10-CM | POA: Diagnosis not present

## 2014-06-26 DIAGNOSIS — R070 Pain in throat: Secondary | ICD-10-CM | POA: Diagnosis not present

## 2014-06-26 DIAGNOSIS — E785 Hyperlipidemia, unspecified: Secondary | ICD-10-CM | POA: Diagnosis not present

## 2014-06-26 DIAGNOSIS — C7989 Secondary malignant neoplasm of other specified sites: Secondary | ICD-10-CM | POA: Diagnosis not present

## 2014-06-26 DIAGNOSIS — R1310 Dysphagia, unspecified: Secondary | ICD-10-CM | POA: Diagnosis not present

## 2014-06-26 DIAGNOSIS — K5900 Constipation, unspecified: Secondary | ICD-10-CM | POA: Diagnosis not present

## 2014-06-26 DIAGNOSIS — Z85828 Personal history of other malignant neoplasm of skin: Secondary | ICD-10-CM | POA: Diagnosis not present

## 2014-06-26 DIAGNOSIS — Z952 Presence of prosthetic heart valve: Secondary | ICD-10-CM | POA: Diagnosis not present

## 2014-06-26 DIAGNOSIS — C4442 Squamous cell carcinoma of skin of scalp and neck: Secondary | ICD-10-CM | POA: Diagnosis not present

## 2014-06-26 DIAGNOSIS — K219 Gastro-esophageal reflux disease without esophagitis: Secondary | ICD-10-CM | POA: Diagnosis not present

## 2014-06-26 NOTE — Progress Notes (Signed)
To provide support and encouragement, care continuity and to assess for needs, met with patient during RT, escorted him to [facility] afterwards, conducted orthostatic VS, joined him for visit with Dr. Isidore Moos.  To address complaints of throat pain, Dr. Isidore Moos Rxed fentanyl.  I educated him on application and care. He verbalized understanding of information provided by MD and navigator.  Gayleen Orem, RN, BSN, [place] at [place] [phone]

## 2014-06-26 NOTE — Telephone Encounter (Signed)
Patient called. I answered his question re: showering while wearing fentanyl patch. He reported:  "Patch is helping".  Pain with swallowing now 6-7/10 instead of 8-9/10.  We discussed that he might realize additional pain control after another day or so of new start.  Dry mouth.  He is using biotene regularly, I encouraged frequent sips of water. He understands he can call me with questions/concerns.  Gayleen Orem, RN, BSN, [place] at [place] [phone]

## 2014-06-27 ENCOUNTER — Ambulatory Visit: Payer: Medicare Other

## 2014-06-30 ENCOUNTER — Ambulatory Visit
Admission: RE | Admit: 2014-06-30 | Discharge: 2014-06-30 | Disposition: A | Discharge status: Home / Self Care | Payer: Medicare Other | Source: Ambulatory Visit | Attending: Radiation Oncology | Admitting: Radiation Oncology

## 2014-06-30 ENCOUNTER — Encounter: Payer: Self-pay | Admitting: Radiation Oncology

## 2014-06-30 ENCOUNTER — Ambulatory Visit (HOSPITAL_COMMUNITY): Payer: Self-pay | Admitting: Dentistry

## 2014-06-30 ENCOUNTER — Encounter (HOSPITAL_COMMUNITY): Payer: Self-pay | Admitting: Dentistry

## 2014-06-30 VITALS — BP 135/68 | HR 70 | Temp 97.5°F | Wt 185.0 lb

## 2014-06-30 VITALS — BP 119/82 | HR 65 | Temp 98.0°F | Resp 20 | Wt 183.5 lb

## 2014-06-30 DIAGNOSIS — R5383 Other fatigue: Secondary | ICD-10-CM | POA: Diagnosis not present

## 2014-06-30 DIAGNOSIS — G510 Bell's palsy: Secondary | ICD-10-CM | POA: Diagnosis not present

## 2014-06-30 DIAGNOSIS — E039 Hypothyroidism, unspecified: Secondary | ICD-10-CM | POA: Diagnosis not present

## 2014-06-30 DIAGNOSIS — C07 Malignant neoplasm of parotid gland: Secondary | ICD-10-CM

## 2014-06-30 DIAGNOSIS — C4440 Unspecified malignant neoplasm of skin of scalp and neck: Secondary | ICD-10-CM

## 2014-06-30 DIAGNOSIS — T66XXXS Radiation sickness, unspecified, sequela: Secondary | ICD-10-CM | POA: Diagnosis not present

## 2014-06-30 DIAGNOSIS — R634 Abnormal weight loss: Secondary | ICD-10-CM | POA: Diagnosis not present

## 2014-06-30 DIAGNOSIS — K1233 Oral mucositis (ulcerative) due to radiation: Secondary | ICD-10-CM

## 2014-06-30 DIAGNOSIS — Z9049 Acquired absence of other specified parts of digestive tract: Secondary | ICD-10-CM | POA: Diagnosis not present

## 2014-06-30 DIAGNOSIS — C4442 Squamous cell carcinoma of skin of scalp and neck: Secondary | ICD-10-CM | POA: Diagnosis not present

## 2014-06-30 DIAGNOSIS — R070 Pain in throat: Secondary | ICD-10-CM | POA: Diagnosis not present

## 2014-06-30 DIAGNOSIS — Z952 Presence of prosthetic heart valve: Secondary | ICD-10-CM | POA: Diagnosis not present

## 2014-06-30 DIAGNOSIS — K117 Disturbances of salivary secretion: Secondary | ICD-10-CM

## 2014-06-30 DIAGNOSIS — C4441 Basal cell carcinoma of skin of scalp and neck: Secondary | ICD-10-CM | POA: Diagnosis not present

## 2014-06-30 DIAGNOSIS — I951 Orthostatic hypotension: Secondary | ICD-10-CM | POA: Diagnosis not present

## 2014-06-30 DIAGNOSIS — Z7982 Long term (current) use of aspirin: Secondary | ICD-10-CM | POA: Diagnosis not present

## 2014-06-30 DIAGNOSIS — R432 Parageusia: Secondary | ICD-10-CM

## 2014-06-30 DIAGNOSIS — Z6826 Body mass index (BMI) 26.0-26.9, adult: Secondary | ICD-10-CM | POA: Diagnosis not present

## 2014-06-30 DIAGNOSIS — Z79899 Other long term (current) drug therapy: Secondary | ICD-10-CM | POA: Diagnosis not present

## 2014-06-30 DIAGNOSIS — R1310 Dysphagia, unspecified: Secondary | ICD-10-CM

## 2014-06-30 DIAGNOSIS — K5900 Constipation, unspecified: Secondary | ICD-10-CM | POA: Diagnosis not present

## 2014-06-30 DIAGNOSIS — Z791 Long term (current) use of non-steroidal anti-inflammatories (NSAID): Secondary | ICD-10-CM | POA: Diagnosis not present

## 2014-06-30 DIAGNOSIS — C7989 Secondary malignant neoplasm of other specified sites: Secondary | ICD-10-CM | POA: Diagnosis not present

## 2014-06-30 DIAGNOSIS — L599 Disorder of the skin and subcutaneous tissue related to radiation, unspecified: Secondary | ICD-10-CM | POA: Diagnosis not present

## 2014-06-30 DIAGNOSIS — R682 Dry mouth, unspecified: Secondary | ICD-10-CM

## 2014-06-30 DIAGNOSIS — E785 Hyperlipidemia, unspecified: Secondary | ICD-10-CM | POA: Diagnosis not present

## 2014-06-30 DIAGNOSIS — Z85828 Personal history of other malignant neoplasm of skin: Secondary | ICD-10-CM | POA: Diagnosis not present

## 2014-06-30 DIAGNOSIS — K219 Gastro-esophageal reflux disease without esophagitis: Secondary | ICD-10-CM | POA: Diagnosis not present

## 2014-06-30 DIAGNOSIS — Y842 Radiological procedure and radiotherapy as the cause of abnormal reaction of the patient, or of later complication, without mention of misadventure at the time of the procedure: Secondary | ICD-10-CM

## 2014-06-30 DIAGNOSIS — I10 Essential (primary) hypertension: Secondary | ICD-10-CM | POA: Diagnosis not present

## 2014-06-30 DIAGNOSIS — Z510 Encounter for antineoplastic radiation therapy: Secondary | ICD-10-CM | POA: Diagnosis not present

## 2014-06-30 DIAGNOSIS — Z0189 Encounter for other specified special examinations: Secondary | ICD-10-CM

## 2014-06-30 MED ORDER — FENTANYL 37.5 MCG/HR TD PT72: 37.5000 ug | MEDICATED_PATCH | TRANSDERMAL | Status: DC

## 2014-06-30 MED ORDER — MINERAL OIL RE ENEM: 1.0000 | ENEMA | Freq: Once | RECTAL | Status: DC

## 2014-06-30 MED ORDER — SENNA 8.6 MG PO TABS: 2.0000 | ORAL_TABLET | Freq: Two times a day (BID) | ORAL | Status: DC | PRN

## 2014-06-30 NOTE — Patient Instructions (Addendum)
RECOMMENDATIONS: 1. Brush after meals and at bedtime.  Restart using fluoride and fluoride trays at bedtime for 5 minutes. 2. Use trismus exercises as directed. 3. Use Biotene Rinse or salt water/baking soda rinses. Use one half teaspoon of salt and 1/2 teaspoon a baking soda per 12 ounces of water. 4. Multiple sips of water as needed. 5. Return to clinic in two months for oral exam after radiation therapy. Call if problems before then.  Lenn Cal, DDS

## 2014-06-30 NOTE — Progress Notes (Signed)
Weekly Management Note:  outpatient    ICD-9-CM ICD-10-CM   1. Skin cancer of scalp or skin of neck 173.41 C44.41 senna (SENOKOT) 8.6 MG TABS tablet     fentaNYL 37.5 MCG/HR PT72     mineral oil enema    Current Dose:  26 Gy  Projected Dose: 66 Gy   Narrative:  The patient presents for routine under treatment assessment.  CBCT/MVCT images/Port film x-rays were reviewed.  The chart was checked. The patient is tolerating radiotherapy with mild improvement in pain - but it's still 7/10 with swallowing.  He is constipated.  He is asking again about proton therapy - we discussed in the past that this was not advisable as it would significantly delay start of adjuvant therapy and increase risk of tumor cell repopulation.  Weight stable. Decreased appetite, but drinking ensure, milkshakes, water.    Physical Findings:    Filed Weights   06/30/14 0953  Weight: 183 lb 8 oz (83.235 kg)   Filed Vitals:   06/30/14 0953  BP: 119/82  Pulse: 65  Temp: 98 F (36.7 C)  Resp: 20   Oral mucosa dry and erythematous, no thrush  CBC    Component Value Date/Time   WBC 4.2 06/24/2014 1019   WBC 7.2 03/31/2014 1140   RBC 4.83 06/24/2014 1019   RBC 4.79 03/31/2014 1140   HGB 13.6 06/24/2014 1019   HGB 15.0 03/31/2014 1140   HCT 42.9 06/24/2014 1019   HCT 44.9 03/31/2014 1140   PLT 231 06/24/2014 1019   PLT 326 03/31/2014 1140   MCV 88.8 06/24/2014 1019   MCV 93.7 03/31/2014 1140   MCH 28.2 06/24/2014 1019   MCH 31.3 03/31/2014 1140   MCHC 31.7* 06/24/2014 1019   MCHC 33.4 03/31/2014 1140   RDW 13.5 06/24/2014 1019   RDW 13.0 03/31/2014 1140   LYMPHSABS 0.6* 06/24/2014 1019   LYMPHSABS 0.8 03/31/2014 1140   MONOABS 0.4 06/24/2014 1019   MONOABS 0.3 03/31/2014 1140   EOSABS 0.2 06/24/2014 1019   EOSABS 0.1 03/31/2014 1140   BASOSABS 0.0 06/24/2014 1019   BASOSABS 0.0 03/31/2014 1140     CMP     Component Value Date/Time   NA 141 06/24/2014 1019   NA 139 03/03/2014 1020   K 4.8 06/24/2014 1019   K 4.5 03/03/2014 1020   CL 103 03/03/2014 1020   CO2 28 06/24/2014 1019   CO2 27 03/03/2014 1020   GLUCOSE 85 06/24/2014 1019   GLUCOSE 98 03/03/2014 1020   BUN 14.2 06/24/2014 1019   BUN 14 03/03/2014 1020   CREATININE 0.9 06/24/2014 1019   CREATININE 0.90 03/03/2014 1020   CREATININE 1.20 10/12/2010 1257   CALCIUM 9.1 06/24/2014 1019   CALCIUM 8.7 03/03/2014 1020   PROT 5.6* 10/22/2013 1218   ALBUMIN 3.0* 10/22/2013 1218   AST 21 10/22/2013 1218   ALT 23 10/22/2013 1218   ALKPHOS 60 10/22/2013 1218   BILITOT 0.5 10/22/2013 1218   GFRNONAA 76* 03/03/2014 1020   GFRAA 88* 03/03/2014 1020     Impression:  The patient is tolerating radiotherapy with mild improvement in pain.  He is constipated.  He is asking again about proton therapy.  Plan:  Continue radiotherapy as planned.   1) The patient is tolerating radiotherapy with mild improvement in pain. Will increase duragesic to 37.5.  Continue hydrocodone prn  2) He is constipated.  Likely due to narcotics.  Senna-S 2 tabs BID; dulcolax and fleets enema prn.  Wrote instructions on how to manage this  3)  He is asking again about proton therapy. It is not in his best interest to "change the course" mid treatments.  Reiterated importance of  his starting adjuvant  RT with minimal delay, which he accomplished.  He is not tempted to change the course but understandably concerned about his high risk disease.    -----------------------------------  [name], MD

## 2014-06-30 NOTE — Progress Notes (Signed)
06/30/2014  Patient Name:   Adrian West Date of Birth:   Dec 10, 1927 Medical Record Number: [id number]  BP 135/68 mmHg  Pulse 70  Temp(Src) 97.5 F (36.4 C) (Oral)  Wt 185 lb (83.915 kg)  [name] presents for oral examination during radiation therapy. Patient has completed 12/33 radiation treatments.  REVIEW OF CHIEF COMPLAINTS:  DRY MOUTH: Yes, very dry. HARD TO SWALLOW: Yes. HURT TO SWALLOW: Yes TASTE CHANGES: Yes SORES IN MOUTH: Yes TRISMUS: No trismus symptoms. WEIGHT: 185 lbs. HOME OH REGIMEN:  BRUSHING: Twice a day  FLOSSING: Twice a day RINSING: Using Biotene rinses. Suggested use of salt water and baking soda rinses as well. FLUORIDE: He is no longer using fluoride in the fluoride trays upon the advice of Dr. Isidore Moos. Patient may have been using too much fluoride in the fluoride trays. Patient did not bring the fluoride and fluoride trays with him today to show how to use it. Patient instructed to restart using fluoride trays as instructed with minimal use of fluoride in the fluoride trays. Patient to discontinue, however, if severe pain in mouth occurs again. TRISMUS EXERCISES:  Maximum interincisal opening: 35 mm   DENTAL EXAM:  Oral Hygiene:(PLAQUE): Plaque was noted. Oral hygiene improvement was highly suggested. LOCATION OF MUCOSITIS: Generalized erythema. No significant ulcerations noted. DESCRIPTION OF SALIVA: Severe xerostomia. ANY EXPOSED BONE: None noted OTHER WATCHED AREAS:  DX: Xerostomia, Dysgeusia, Dysphagia, Odynophagia, Accretions and Mucositis  RECOMMENDATIONS: 1. Brush after meals and at bedtime.  Restart using fluoride and fluoride trays at bedtime for 5 minutes. 2. Use trismus exercises as directed. 3. Use Biotene Rinse or salt water/baking soda rinses. Use one half teaspoon of salt and 1/2 teaspoon a baking soda per 12 ounces of water. 4. Multiple sips of water as needed. 5. Return to clinic in two months for oral exam after radiation  therapy. Call if problems before then.  Lenn Cal, DDS

## 2014-06-30 NOTE — Progress Notes (Signed)
Patient is wearing Duragesic patch 25 mcg, taking Hydrocodone 1 tab 3-4 x daily. He states the roof of his mouth and his throat continue to hurt with swallowing, 7/10 on pain scale which is a slight improvement. He states at times his swallowing is 6/10 on pain scale. He states he is using Biotene, saw Dr [name] this morning. Dr [name] has advised pt restart fluoride treatments using small amount fluoride in trays. Pt has lost 1.5 lbs in past week, drinking 3-4 Ensure daily with ice cream added, eating soft foods. He states he has not had BM x 3 days, taking Docusate 4 tabs daily past 2-3 days. Advised he may need Fleets enema today. Also advised he take stool softeners 2 tabs every morning and every night as long as he is taking Hydrocodone regularly. Pt has not begin applying Biafine to face/neck, has slight pinkness of skin. Advised he begin applying lotion. Pt fatigued, loss of tastes.

## 2014-07-01 ENCOUNTER — Ambulatory Visit
Admission: RE | Admit: 2014-07-01 | Discharge: 2014-07-01 | Disposition: A | Discharge status: Home / Self Care | Payer: Medicare Other | Source: Ambulatory Visit | Attending: Radiation Oncology | Admitting: Radiation Oncology

## 2014-07-01 DIAGNOSIS — C4441 Basal cell carcinoma of skin of scalp and neck: Secondary | ICD-10-CM | POA: Diagnosis not present

## 2014-07-01 DIAGNOSIS — R070 Pain in throat: Secondary | ICD-10-CM | POA: Diagnosis not present

## 2014-07-01 DIAGNOSIS — K117 Disturbances of salivary secretion: Secondary | ICD-10-CM | POA: Diagnosis not present

## 2014-07-01 DIAGNOSIS — K1233 Oral mucositis (ulcerative) due to radiation: Secondary | ICD-10-CM | POA: Diagnosis not present

## 2014-07-01 DIAGNOSIS — Z9049 Acquired absence of other specified parts of digestive tract: Secondary | ICD-10-CM | POA: Diagnosis not present

## 2014-07-01 DIAGNOSIS — Z791 Long term (current) use of non-steroidal anti-inflammatories (NSAID): Secondary | ICD-10-CM | POA: Diagnosis not present

## 2014-07-01 DIAGNOSIS — K5900 Constipation, unspecified: Secondary | ICD-10-CM | POA: Diagnosis not present

## 2014-07-01 DIAGNOSIS — Z79899 Other long term (current) drug therapy: Secondary | ICD-10-CM | POA: Diagnosis not present

## 2014-07-01 DIAGNOSIS — E039 Hypothyroidism, unspecified: Secondary | ICD-10-CM | POA: Diagnosis not present

## 2014-07-01 DIAGNOSIS — R634 Abnormal weight loss: Secondary | ICD-10-CM | POA: Diagnosis not present

## 2014-07-01 DIAGNOSIS — E785 Hyperlipidemia, unspecified: Secondary | ICD-10-CM | POA: Diagnosis not present

## 2014-07-01 DIAGNOSIS — Z85828 Personal history of other malignant neoplasm of skin: Secondary | ICD-10-CM | POA: Diagnosis not present

## 2014-07-01 DIAGNOSIS — L599 Disorder of the skin and subcutaneous tissue related to radiation, unspecified: Secondary | ICD-10-CM | POA: Diagnosis not present

## 2014-07-01 DIAGNOSIS — R5383 Other fatigue: Secondary | ICD-10-CM | POA: Diagnosis not present

## 2014-07-01 DIAGNOSIS — G510 Bell's palsy: Secondary | ICD-10-CM | POA: Diagnosis not present

## 2014-07-01 DIAGNOSIS — C7989 Secondary malignant neoplasm of other specified sites: Secondary | ICD-10-CM | POA: Diagnosis not present

## 2014-07-01 DIAGNOSIS — Z952 Presence of prosthetic heart valve: Secondary | ICD-10-CM | POA: Diagnosis not present

## 2014-07-01 DIAGNOSIS — I10 Essential (primary) hypertension: Secondary | ICD-10-CM | POA: Diagnosis not present

## 2014-07-01 DIAGNOSIS — I951 Orthostatic hypotension: Secondary | ICD-10-CM | POA: Diagnosis not present

## 2014-07-01 DIAGNOSIS — Z6826 Body mass index (BMI) 26.0-26.9, adult: Secondary | ICD-10-CM | POA: Diagnosis not present

## 2014-07-01 DIAGNOSIS — Z510 Encounter for antineoplastic radiation therapy: Secondary | ICD-10-CM | POA: Diagnosis not present

## 2014-07-01 DIAGNOSIS — R1310 Dysphagia, unspecified: Secondary | ICD-10-CM | POA: Diagnosis not present

## 2014-07-01 DIAGNOSIS — T66XXXS Radiation sickness, unspecified, sequela: Secondary | ICD-10-CM | POA: Diagnosis not present

## 2014-07-01 DIAGNOSIS — K219 Gastro-esophageal reflux disease without esophagitis: Secondary | ICD-10-CM | POA: Diagnosis not present

## 2014-07-01 DIAGNOSIS — C4442 Squamous cell carcinoma of skin of scalp and neck: Secondary | ICD-10-CM | POA: Diagnosis not present

## 2014-07-01 DIAGNOSIS — Z7982 Long term (current) use of aspirin: Secondary | ICD-10-CM | POA: Diagnosis not present

## 2014-07-02 ENCOUNTER — Ambulatory Visit
Admission: RE | Admit: 2014-07-02 | Discharge: 2014-07-02 | Disposition: A | Discharge status: Home / Self Care | Payer: Medicare Other | Source: Ambulatory Visit | Attending: Radiation Oncology | Admitting: Radiation Oncology

## 2014-07-02 DIAGNOSIS — I951 Orthostatic hypotension: Secondary | ICD-10-CM | POA: Diagnosis not present

## 2014-07-02 DIAGNOSIS — Z85828 Personal history of other malignant neoplasm of skin: Secondary | ICD-10-CM | POA: Diagnosis not present

## 2014-07-02 DIAGNOSIS — K1233 Oral mucositis (ulcerative) due to radiation: Secondary | ICD-10-CM | POA: Diagnosis not present

## 2014-07-02 DIAGNOSIS — T66XXXS Radiation sickness, unspecified, sequela: Secondary | ICD-10-CM | POA: Diagnosis not present

## 2014-07-02 DIAGNOSIS — R070 Pain in throat: Secondary | ICD-10-CM | POA: Diagnosis not present

## 2014-07-02 DIAGNOSIS — Z510 Encounter for antineoplastic radiation therapy: Secondary | ICD-10-CM | POA: Diagnosis not present

## 2014-07-02 DIAGNOSIS — R1310 Dysphagia, unspecified: Secondary | ICD-10-CM | POA: Diagnosis not present

## 2014-07-02 DIAGNOSIS — K117 Disturbances of salivary secretion: Secondary | ICD-10-CM | POA: Diagnosis not present

## 2014-07-02 DIAGNOSIS — D495 Neoplasm of unspecified behavior of other genitourinary organs: Secondary | ICD-10-CM | POA: Diagnosis not present

## 2014-07-02 DIAGNOSIS — R5383 Other fatigue: Secondary | ICD-10-CM | POA: Diagnosis not present

## 2014-07-02 DIAGNOSIS — Z6826 Body mass index (BMI) 26.0-26.9, adult: Secondary | ICD-10-CM | POA: Diagnosis not present

## 2014-07-02 DIAGNOSIS — C4442 Squamous cell carcinoma of skin of scalp and neck: Secondary | ICD-10-CM | POA: Diagnosis not present

## 2014-07-02 DIAGNOSIS — Z79899 Other long term (current) drug therapy: Secondary | ICD-10-CM | POA: Diagnosis not present

## 2014-07-02 DIAGNOSIS — R634 Abnormal weight loss: Secondary | ICD-10-CM | POA: Diagnosis not present

## 2014-07-02 DIAGNOSIS — E785 Hyperlipidemia, unspecified: Secondary | ICD-10-CM | POA: Diagnosis not present

## 2014-07-02 DIAGNOSIS — K5900 Constipation, unspecified: Secondary | ICD-10-CM | POA: Diagnosis not present

## 2014-07-02 DIAGNOSIS — E039 Hypothyroidism, unspecified: Secondary | ICD-10-CM | POA: Diagnosis not present

## 2014-07-02 DIAGNOSIS — K219 Gastro-esophageal reflux disease without esophagitis: Secondary | ICD-10-CM | POA: Diagnosis not present

## 2014-07-02 DIAGNOSIS — N529 Male erectile dysfunction, unspecified: Secondary | ICD-10-CM | POA: Diagnosis not present

## 2014-07-02 DIAGNOSIS — Z7982 Long term (current) use of aspirin: Secondary | ICD-10-CM | POA: Diagnosis not present

## 2014-07-02 DIAGNOSIS — Z952 Presence of prosthetic heart valve: Secondary | ICD-10-CM | POA: Diagnosis not present

## 2014-07-02 DIAGNOSIS — L599 Disorder of the skin and subcutaneous tissue related to radiation, unspecified: Secondary | ICD-10-CM | POA: Diagnosis not present

## 2014-07-02 DIAGNOSIS — Z9049 Acquired absence of other specified parts of digestive tract: Secondary | ICD-10-CM | POA: Diagnosis not present

## 2014-07-02 DIAGNOSIS — I10 Essential (primary) hypertension: Secondary | ICD-10-CM | POA: Diagnosis not present

## 2014-07-02 DIAGNOSIS — C7989 Secondary malignant neoplasm of other specified sites: Secondary | ICD-10-CM | POA: Diagnosis not present

## 2014-07-02 DIAGNOSIS — C4441 Basal cell carcinoma of skin of scalp and neck: Secondary | ICD-10-CM | POA: Diagnosis not present

## 2014-07-02 DIAGNOSIS — G510 Bell's palsy: Secondary | ICD-10-CM | POA: Diagnosis not present

## 2014-07-02 DIAGNOSIS — Z791 Long term (current) use of non-steroidal anti-inflammatories (NSAID): Secondary | ICD-10-CM | POA: Diagnosis not present

## 2014-07-02 DIAGNOSIS — C649 Malignant neoplasm of unspecified kidney, except renal pelvis: Secondary | ICD-10-CM | POA: Diagnosis not present

## 2014-07-03 ENCOUNTER — Ambulatory Visit
Admission: RE | Admit: 2014-07-03 | Discharge: 2014-07-03 | Disposition: A | Discharge status: Home / Self Care | Payer: Medicare Other | Source: Ambulatory Visit | Attending: Radiation Oncology | Admitting: Radiation Oncology

## 2014-07-03 DIAGNOSIS — Z85828 Personal history of other malignant neoplasm of skin: Secondary | ICD-10-CM | POA: Diagnosis not present

## 2014-07-03 DIAGNOSIS — C7989 Secondary malignant neoplasm of other specified sites: Secondary | ICD-10-CM | POA: Diagnosis not present

## 2014-07-03 DIAGNOSIS — Z79899 Other long term (current) drug therapy: Secondary | ICD-10-CM | POA: Diagnosis not present

## 2014-07-03 DIAGNOSIS — K117 Disturbances of salivary secretion: Secondary | ICD-10-CM | POA: Diagnosis not present

## 2014-07-03 DIAGNOSIS — C4441 Basal cell carcinoma of skin of scalp and neck: Secondary | ICD-10-CM | POA: Diagnosis not present

## 2014-07-03 DIAGNOSIS — K219 Gastro-esophageal reflux disease without esophagitis: Secondary | ICD-10-CM | POA: Diagnosis not present

## 2014-07-03 DIAGNOSIS — R5383 Other fatigue: Secondary | ICD-10-CM | POA: Diagnosis not present

## 2014-07-03 DIAGNOSIS — Z9049 Acquired absence of other specified parts of digestive tract: Secondary | ICD-10-CM | POA: Diagnosis not present

## 2014-07-03 DIAGNOSIS — Z952 Presence of prosthetic heart valve: Secondary | ICD-10-CM | POA: Diagnosis not present

## 2014-07-03 DIAGNOSIS — G510 Bell's palsy: Secondary | ICD-10-CM | POA: Diagnosis not present

## 2014-07-03 DIAGNOSIS — I951 Orthostatic hypotension: Secondary | ICD-10-CM | POA: Diagnosis not present

## 2014-07-03 DIAGNOSIS — K1233 Oral mucositis (ulcerative) due to radiation: Secondary | ICD-10-CM | POA: Diagnosis not present

## 2014-07-03 DIAGNOSIS — R634 Abnormal weight loss: Secondary | ICD-10-CM | POA: Diagnosis not present

## 2014-07-03 DIAGNOSIS — Z6826 Body mass index (BMI) 26.0-26.9, adult: Secondary | ICD-10-CM | POA: Diagnosis not present

## 2014-07-03 DIAGNOSIS — R1310 Dysphagia, unspecified: Secondary | ICD-10-CM | POA: Diagnosis not present

## 2014-07-03 DIAGNOSIS — C4442 Squamous cell carcinoma of skin of scalp and neck: Secondary | ICD-10-CM | POA: Diagnosis not present

## 2014-07-03 DIAGNOSIS — Z7982 Long term (current) use of aspirin: Secondary | ICD-10-CM | POA: Diagnosis not present

## 2014-07-03 DIAGNOSIS — T66XXXS Radiation sickness, unspecified, sequela: Secondary | ICD-10-CM | POA: Diagnosis not present

## 2014-07-03 DIAGNOSIS — R070 Pain in throat: Secondary | ICD-10-CM | POA: Diagnosis not present

## 2014-07-03 DIAGNOSIS — L599 Disorder of the skin and subcutaneous tissue related to radiation, unspecified: Secondary | ICD-10-CM | POA: Diagnosis not present

## 2014-07-03 DIAGNOSIS — E785 Hyperlipidemia, unspecified: Secondary | ICD-10-CM | POA: Diagnosis not present

## 2014-07-03 DIAGNOSIS — I10 Essential (primary) hypertension: Secondary | ICD-10-CM | POA: Diagnosis not present

## 2014-07-03 DIAGNOSIS — Z510 Encounter for antineoplastic radiation therapy: Secondary | ICD-10-CM | POA: Diagnosis not present

## 2014-07-03 DIAGNOSIS — E039 Hypothyroidism, unspecified: Secondary | ICD-10-CM | POA: Diagnosis not present

## 2014-07-03 DIAGNOSIS — K5900 Constipation, unspecified: Secondary | ICD-10-CM | POA: Diagnosis not present

## 2014-07-03 DIAGNOSIS — Z791 Long term (current) use of non-steroidal anti-inflammatories (NSAID): Secondary | ICD-10-CM | POA: Diagnosis not present

## 2014-07-04 ENCOUNTER — Encounter: Payer: Self-pay | Admitting: Nutrition

## 2014-07-04 ENCOUNTER — Ambulatory Visit
Admission: RE | Admit: 2014-07-04 | Discharge: 2014-07-04 | Disposition: A | Discharge status: Home / Self Care | Payer: Medicare Other | Source: Ambulatory Visit | Attending: Radiation Oncology | Admitting: Radiation Oncology

## 2014-07-04 DIAGNOSIS — Z9049 Acquired absence of other specified parts of digestive tract: Secondary | ICD-10-CM | POA: Diagnosis not present

## 2014-07-04 DIAGNOSIS — Z85828 Personal history of other malignant neoplasm of skin: Secondary | ICD-10-CM | POA: Diagnosis not present

## 2014-07-04 DIAGNOSIS — R5383 Other fatigue: Secondary | ICD-10-CM | POA: Diagnosis not present

## 2014-07-04 DIAGNOSIS — E039 Hypothyroidism, unspecified: Secondary | ICD-10-CM | POA: Diagnosis not present

## 2014-07-04 DIAGNOSIS — L599 Disorder of the skin and subcutaneous tissue related to radiation, unspecified: Secondary | ICD-10-CM | POA: Diagnosis not present

## 2014-07-04 DIAGNOSIS — C7989 Secondary malignant neoplasm of other specified sites: Secondary | ICD-10-CM | POA: Diagnosis not present

## 2014-07-04 DIAGNOSIS — Z510 Encounter for antineoplastic radiation therapy: Secondary | ICD-10-CM | POA: Diagnosis not present

## 2014-07-04 DIAGNOSIS — Z7982 Long term (current) use of aspirin: Secondary | ICD-10-CM | POA: Diagnosis not present

## 2014-07-04 DIAGNOSIS — Z6826 Body mass index (BMI) 26.0-26.9, adult: Secondary | ICD-10-CM | POA: Diagnosis not present

## 2014-07-04 DIAGNOSIS — Z79899 Other long term (current) drug therapy: Secondary | ICD-10-CM | POA: Diagnosis not present

## 2014-07-04 DIAGNOSIS — C4441 Basal cell carcinoma of skin of scalp and neck: Secondary | ICD-10-CM | POA: Diagnosis not present

## 2014-07-04 DIAGNOSIS — R1310 Dysphagia, unspecified: Secondary | ICD-10-CM | POA: Diagnosis not present

## 2014-07-04 DIAGNOSIS — R634 Abnormal weight loss: Secondary | ICD-10-CM | POA: Diagnosis not present

## 2014-07-04 DIAGNOSIS — K5900 Constipation, unspecified: Secondary | ICD-10-CM | POA: Diagnosis not present

## 2014-07-04 DIAGNOSIS — R070 Pain in throat: Secondary | ICD-10-CM | POA: Diagnosis not present

## 2014-07-04 DIAGNOSIS — K219 Gastro-esophageal reflux disease without esophagitis: Secondary | ICD-10-CM | POA: Diagnosis not present

## 2014-07-04 DIAGNOSIS — T66XXXS Radiation sickness, unspecified, sequela: Secondary | ICD-10-CM | POA: Diagnosis not present

## 2014-07-04 DIAGNOSIS — E785 Hyperlipidemia, unspecified: Secondary | ICD-10-CM | POA: Diagnosis not present

## 2014-07-04 DIAGNOSIS — C4442 Squamous cell carcinoma of skin of scalp and neck: Secondary | ICD-10-CM | POA: Diagnosis not present

## 2014-07-04 DIAGNOSIS — I10 Essential (primary) hypertension: Secondary | ICD-10-CM | POA: Diagnosis not present

## 2014-07-04 DIAGNOSIS — G510 Bell's palsy: Secondary | ICD-10-CM | POA: Diagnosis not present

## 2014-07-04 DIAGNOSIS — I951 Orthostatic hypotension: Secondary | ICD-10-CM | POA: Diagnosis not present

## 2014-07-04 DIAGNOSIS — Z791 Long term (current) use of non-steroidal anti-inflammatories (NSAID): Secondary | ICD-10-CM | POA: Diagnosis not present

## 2014-07-04 DIAGNOSIS — K117 Disturbances of salivary secretion: Secondary | ICD-10-CM | POA: Diagnosis not present

## 2014-07-04 DIAGNOSIS — Z952 Presence of prosthetic heart valve: Secondary | ICD-10-CM | POA: Diagnosis not present

## 2014-07-04 DIAGNOSIS — K1233 Oral mucositis (ulcerative) due to radiation: Secondary | ICD-10-CM | POA: Diagnosis not present

## 2014-07-04 NOTE — Progress Notes (Signed)
Patient did not show up for scheduled nutrition appointment. 

## 2014-07-06 ENCOUNTER — Ambulatory Visit: Payer: Medicare Other

## 2014-07-07 ENCOUNTER — Ambulatory Visit: Payer: Medicare Other

## 2014-07-07 ENCOUNTER — Ambulatory Visit: Payer: Medicare Other | Admitting: Nutrition

## 2014-07-07 ENCOUNTER — Encounter: Payer: Self-pay | Admitting: *Deleted

## 2014-07-07 ENCOUNTER — Other Ambulatory Visit: Payer: Self-pay | Admitting: Radiation Oncology

## 2014-07-07 ENCOUNTER — Telehealth: Payer: Self-pay | Admitting: *Deleted

## 2014-07-07 ENCOUNTER — Ambulatory Visit
Admission: RE | Admit: 2014-07-07 | Discharge: 2014-07-07 | Disposition: A | Discharge status: Home / Self Care | Payer: Medicare Other | Source: Ambulatory Visit | Attending: Radiation Oncology | Admitting: Radiation Oncology

## 2014-07-07 DIAGNOSIS — Z791 Long term (current) use of non-steroidal anti-inflammatories (NSAID): Secondary | ICD-10-CM | POA: Diagnosis not present

## 2014-07-07 DIAGNOSIS — R5383 Other fatigue: Secondary | ICD-10-CM | POA: Diagnosis not present

## 2014-07-07 DIAGNOSIS — Z9049 Acquired absence of other specified parts of digestive tract: Secondary | ICD-10-CM | POA: Diagnosis not present

## 2014-07-07 DIAGNOSIS — K117 Disturbances of salivary secretion: Secondary | ICD-10-CM | POA: Diagnosis not present

## 2014-07-07 DIAGNOSIS — Z510 Encounter for antineoplastic radiation therapy: Secondary | ICD-10-CM | POA: Diagnosis not present

## 2014-07-07 DIAGNOSIS — T66XXXS Radiation sickness, unspecified, sequela: Secondary | ICD-10-CM | POA: Diagnosis not present

## 2014-07-07 DIAGNOSIS — C4440 Unspecified malignant neoplasm of skin of scalp and neck: Secondary | ICD-10-CM

## 2014-07-07 DIAGNOSIS — K219 Gastro-esophageal reflux disease without esophagitis: Secondary | ICD-10-CM | POA: Diagnosis not present

## 2014-07-07 DIAGNOSIS — G510 Bell's palsy: Secondary | ICD-10-CM | POA: Diagnosis not present

## 2014-07-07 DIAGNOSIS — R634 Abnormal weight loss: Secondary | ICD-10-CM | POA: Diagnosis not present

## 2014-07-07 DIAGNOSIS — Z952 Presence of prosthetic heart valve: Secondary | ICD-10-CM | POA: Diagnosis not present

## 2014-07-07 DIAGNOSIS — K5900 Constipation, unspecified: Secondary | ICD-10-CM | POA: Diagnosis not present

## 2014-07-07 DIAGNOSIS — C7989 Secondary malignant neoplasm of other specified sites: Secondary | ICD-10-CM | POA: Diagnosis not present

## 2014-07-07 DIAGNOSIS — I10 Essential (primary) hypertension: Secondary | ICD-10-CM | POA: Diagnosis not present

## 2014-07-07 DIAGNOSIS — Z6826 Body mass index (BMI) 26.0-26.9, adult: Secondary | ICD-10-CM | POA: Diagnosis not present

## 2014-07-07 DIAGNOSIS — R070 Pain in throat: Secondary | ICD-10-CM | POA: Diagnosis not present

## 2014-07-07 DIAGNOSIS — C4442 Squamous cell carcinoma of skin of scalp and neck: Secondary | ICD-10-CM | POA: Diagnosis not present

## 2014-07-07 DIAGNOSIS — K1233 Oral mucositis (ulcerative) due to radiation: Secondary | ICD-10-CM | POA: Diagnosis not present

## 2014-07-07 DIAGNOSIS — Z79899 Other long term (current) drug therapy: Secondary | ICD-10-CM | POA: Diagnosis not present

## 2014-07-07 DIAGNOSIS — E785 Hyperlipidemia, unspecified: Secondary | ICD-10-CM | POA: Diagnosis not present

## 2014-07-07 DIAGNOSIS — Z7982 Long term (current) use of aspirin: Secondary | ICD-10-CM | POA: Diagnosis not present

## 2014-07-07 DIAGNOSIS — L599 Disorder of the skin and subcutaneous tissue related to radiation, unspecified: Secondary | ICD-10-CM | POA: Diagnosis not present

## 2014-07-07 DIAGNOSIS — E039 Hypothyroidism, unspecified: Secondary | ICD-10-CM | POA: Diagnosis not present

## 2014-07-07 DIAGNOSIS — C4441 Basal cell carcinoma of skin of scalp and neck: Secondary | ICD-10-CM | POA: Diagnosis not present

## 2014-07-07 DIAGNOSIS — R1310 Dysphagia, unspecified: Secondary | ICD-10-CM | POA: Diagnosis not present

## 2014-07-07 DIAGNOSIS — I951 Orthostatic hypotension: Secondary | ICD-10-CM | POA: Diagnosis not present

## 2014-07-07 DIAGNOSIS — Z85828 Personal history of other malignant neoplasm of skin: Secondary | ICD-10-CM | POA: Diagnosis not present

## 2014-07-07 NOTE — Telephone Encounter (Signed)
CALLED PATIENT TO INFORM OF STAT LAB ON 07-08-14 @ 9 AM, LVM FOR A RETURN CALL

## 2014-07-07 NOTE — Progress Notes (Signed)
To provide support and encouragement, care continuity and to assess for needs, met with patient prior to and after RT, spoke with his wife during his tmt. He/she expressed concern that he has been unable to eat. He self-reported a weight loss of 10 lbs. He indicated he is using Lidocaine prior to meals to soothe throat. In context of next scheduled appt with Nutrition being scheduled for 2/10, I arranged for them to see Dory Peru, Nutrition, after RT,  They understand he will be seeing Dr. Isidore Moos on Wednesday, expressed comfort with that since they are seeing Barb. I informed Dr. Isidore Moos of encounter.  Gayleen Orem, RN, BSN, [place] at [place] [phone]

## 2014-07-07 NOTE — Telephone Encounter (Signed)
Per Dr Isidore Moos, patient may need IVF tomorrow. Spoke with Sharyn Lull, med onc infusion scheduler. She states the infusion appointments are full through Thurs this week. Spoke with Tiffanny at [facility] and scheduled pt for IVF at 10:30 am, following his 9:20 am radiation and VS check.

## 2014-07-07 NOTE — Telephone Encounter (Signed)
Called patient.  Informed him of 9:00 labs prior to 9:20 RT and potential 10:00 IVF scheduled at [facility] depending on results of lab work.  He verbalized understanding.  Gayleen Orem, RN, BSN, [place] at [place] [phone]

## 2014-07-07 NOTE — Progress Notes (Signed)
Maybe it was a typo, [name], but this patient had labs on 1/191/16...just wanted to clarify.  Adrian West :)

## 2014-07-07 NOTE — Progress Notes (Signed)
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  Labs January 19 reviewed.  Sodium 141, potassium 4.8, BUN 14.2, and creatinine 0.9. (all within normal limits)  Weight documented as 172.2 pounds February 1 decreased from 183.5 pounds January 25.  This is a 11.3 pound weight loss or 6% weight loss over one week.  Patient reports severe mouth pain and sensitive tongue has increased dramatically over the past several days.  Patient states he has stopped using pain medication because "it does not help".   States he only has pain when trying to eat.  States pain medication and instructions stated he should only take if needed.  Patient describes food is tasting "repulsive".  Cannot describe actual flavors of food. According to patient and wife, patient has eaten "next to nothing" over the past few days.   Patient reports history of constipation which was relieved with a fleets enema however, patient continues to report constipation.  He attributes this to eating less.   Weight loss does reflect an 11 pound loss over one week indicating dehydration as well as loss of lean body mass/fat stores. Patient requests a pill that he can take to provide both calories and protein.  Nutrition diagnosis: Food and nutrition related knowledge deficit continues. Patient meets criteria for severe malnutrition in the context of acute illness secondary to greater than 2% weight loss over one week and less than 50% of energy intake for greater than 5 days.  Estimated nutrition needs: 2200-2500 cal, 105-125 g protein, 2.5 L fluid.  Intervention:  Educated patient on strategies for improving taste alterations. Explained to patient that he needed both calories and protein through some food or liquid source and there is not a pill that will take the place of this.. Patient prefers to focus on oral nutrition supplements instead of food at this time. Recommended patient  consume 2 ounces Ensure Plus every 15 minutes while awake with eventual goal of increasing to 6 bottles Ensure Plus daily, to meet greater than 90% caloric needs. I am hopeful patient will tolerate some soft foods. Patient may require IV fluids and labs.  Current lab work was completed prior to patient's dramatic decreased oral intake. Encouraged patient to take pain medications as prescribed so that it is easier for him to swallow.  Monitoring, evaluation, goals: Patient will work to increase oral intake of Ensure Plus and water.    Next visit: Wednesday, February 3.  After radiation treatment.  **Disclaimer: This note was dictated with voice recognition software. Similar sounding words can inadvertently be transcribed and this note may contain transcription errors which may not have been corrected upon publication of note.**

## 2014-07-08 ENCOUNTER — Other Ambulatory Visit: Payer: Self-pay | Admitting: Radiation Oncology

## 2014-07-08 ENCOUNTER — Ambulatory Visit
Admission: RE | Admit: 2014-07-08 | Discharge: 2014-07-08 | Disposition: A | Discharge status: Home / Self Care | Payer: Medicare Other | Source: Ambulatory Visit | Attending: Radiation Oncology | Admitting: Radiation Oncology

## 2014-07-08 ENCOUNTER — Ambulatory Visit (HOSPITAL_COMMUNITY)
Admission: RE | Admit: 2014-07-08 | Discharge: 2014-07-08 | Disposition: A | Discharge status: Home / Self Care | Payer: Medicare Other | Source: Ambulatory Visit | Attending: Radiation Oncology | Admitting: Radiation Oncology

## 2014-07-08 ENCOUNTER — Telehealth: Payer: Self-pay | Admitting: *Deleted

## 2014-07-08 ENCOUNTER — Encounter: Payer: Self-pay | Admitting: *Deleted

## 2014-07-08 ENCOUNTER — Ambulatory Visit: Payer: Medicare Other

## 2014-07-08 DIAGNOSIS — Z6826 Body mass index (BMI) 26.0-26.9, adult: Secondary | ICD-10-CM | POA: Diagnosis not present

## 2014-07-08 DIAGNOSIS — T66XXXS Radiation sickness, unspecified, sequela: Secondary | ICD-10-CM | POA: Diagnosis not present

## 2014-07-08 DIAGNOSIS — C4441 Basal cell carcinoma of skin of scalp and neck: Secondary | ICD-10-CM | POA: Diagnosis not present

## 2014-07-08 DIAGNOSIS — R5383 Other fatigue: Secondary | ICD-10-CM | POA: Diagnosis not present

## 2014-07-08 DIAGNOSIS — K117 Disturbances of salivary secretion: Secondary | ICD-10-CM | POA: Diagnosis not present

## 2014-07-08 DIAGNOSIS — Z791 Long term (current) use of non-steroidal anti-inflammatories (NSAID): Secondary | ICD-10-CM | POA: Diagnosis not present

## 2014-07-08 DIAGNOSIS — K219 Gastro-esophageal reflux disease without esophagitis: Secondary | ICD-10-CM | POA: Diagnosis not present

## 2014-07-08 DIAGNOSIS — L599 Disorder of the skin and subcutaneous tissue related to radiation, unspecified: Secondary | ICD-10-CM | POA: Diagnosis not present

## 2014-07-08 DIAGNOSIS — R634 Abnormal weight loss: Secondary | ICD-10-CM | POA: Diagnosis not present

## 2014-07-08 DIAGNOSIS — Z510 Encounter for antineoplastic radiation therapy: Secondary | ICD-10-CM | POA: Diagnosis not present

## 2014-07-08 DIAGNOSIS — E039 Hypothyroidism, unspecified: Secondary | ICD-10-CM | POA: Diagnosis not present

## 2014-07-08 DIAGNOSIS — I951 Orthostatic hypotension: Secondary | ICD-10-CM | POA: Diagnosis not present

## 2014-07-08 DIAGNOSIS — Z952 Presence of prosthetic heart valve: Secondary | ICD-10-CM | POA: Diagnosis not present

## 2014-07-08 DIAGNOSIS — C4440 Unspecified malignant neoplasm of skin of scalp and neck: Secondary | ICD-10-CM

## 2014-07-08 DIAGNOSIS — R070 Pain in throat: Secondary | ICD-10-CM | POA: Diagnosis not present

## 2014-07-08 DIAGNOSIS — I10 Essential (primary) hypertension: Secondary | ICD-10-CM | POA: Diagnosis not present

## 2014-07-08 DIAGNOSIS — Z85828 Personal history of other malignant neoplasm of skin: Secondary | ICD-10-CM | POA: Diagnosis not present

## 2014-07-08 DIAGNOSIS — E785 Hyperlipidemia, unspecified: Secondary | ICD-10-CM | POA: Diagnosis not present

## 2014-07-08 DIAGNOSIS — Z79899 Other long term (current) drug therapy: Secondary | ICD-10-CM | POA: Diagnosis not present

## 2014-07-08 DIAGNOSIS — Z7982 Long term (current) use of aspirin: Secondary | ICD-10-CM | POA: Diagnosis not present

## 2014-07-08 DIAGNOSIS — C4442 Squamous cell carcinoma of skin of scalp and neck: Secondary | ICD-10-CM | POA: Diagnosis not present

## 2014-07-08 DIAGNOSIS — Z9049 Acquired absence of other specified parts of digestive tract: Secondary | ICD-10-CM | POA: Diagnosis not present

## 2014-07-08 DIAGNOSIS — C7989 Secondary malignant neoplasm of other specified sites: Secondary | ICD-10-CM | POA: Diagnosis not present

## 2014-07-08 DIAGNOSIS — K1233 Oral mucositis (ulcerative) due to radiation: Secondary | ICD-10-CM | POA: Diagnosis not present

## 2014-07-08 DIAGNOSIS — G510 Bell's palsy: Secondary | ICD-10-CM | POA: Diagnosis not present

## 2014-07-08 DIAGNOSIS — R1310 Dysphagia, unspecified: Secondary | ICD-10-CM | POA: Diagnosis not present

## 2014-07-08 DIAGNOSIS — K5900 Constipation, unspecified: Secondary | ICD-10-CM | POA: Diagnosis not present

## 2014-07-08 LAB — BASIC METABOLIC PANEL (CC13)
Anion Gap: 10 mEq/L (ref 3–11)
BUN: 27.2 mg/dL — AB (ref 7.0–26.0)
CO2: 26 mEq/L (ref 22–29)
CREATININE: 1 mg/dL (ref 0.7–1.3)
Calcium: 9.3 mg/dL (ref 8.4–10.4)
Chloride: 106 mEq/L (ref 98–109)
EGFR: 73 mL/min/{1.73_m2} — AB (ref >90)
Glucose: 65 mg/dl — ABNORMAL LOW (ref 70–140)
Potassium: 3.6 mEq/L (ref 3.5–5.1)
Sodium: 142 mEq/L (ref 136–145)

## 2014-07-08 MED ORDER — POTASSIUM CHLORIDE 2 MEQ/ML IV SOLN
INTRAVENOUS | Status: AC
  Administered 2014-07-08: 11:00:00 via INTRAVENOUS
  Filled 2014-07-08: qty 1000

## 2014-07-08 NOTE — Telephone Encounter (Signed)
Received voice mail from [place] with Hospice. She states she received a call from Adrian West yesterday asking that he be referred for a palliative consult. Adrian West is requesting Dr Isidore Moos place this referral, and Hospice will schedule pt to see a NP.  Will route this request to Dr Isidore Moos.

## 2014-07-08 NOTE — Progress Notes (Signed)
Patient in nursing following radiation therapy for weight/VS check. Patient states his wife is keeping record of his intake, and he took in > 1200 calories yesterday. He states he drank 2/3 can Ensure this morning and took med for nausea as a precaution. He states he feels better today, denies HA, dizziness. Patient is wearing Duragesic patch. Spoke with Dr Isidore Moos who states IVF orders have been changed. Patient given directions to [facility] center, walked with him to elevator. Pt used valet service today, instructed him to use handicap parking if available.  Spoke with Charlene at [facility] and advised her of IVF order change. She verbalized understanding.  10:20 am Adrian West going to [facility] to check on patient.

## 2014-07-08 NOTE — Procedures (Signed)
[facility]  Procedure Note  Adrian West ZOX:[id number] DOB: 1928-01-22 DOA: 07/08/2014   Dr. Isidore Moos  Associated Diagnosis: skin cancer of scalp or skin of neck  Procedure Note: Iv started, IV fluids infused per order, IV discontinued without difficulty   Condition During Procedure: patient denies any complaints   Condition at Discharge: stable, ambulatory.  Patient is driving himself home   [name], [name], [facility]

## 2014-07-08 NOTE — Progress Notes (Signed)
Met patient at entrance of [facility] as he arrived for IVF at [facility], escorted him to clinic. 1. He confirmed he is using Fentanyl patch and Lidocaine rinse as prescribed, throat pain is manageable. 2. He reported that his nutritional intake has improved since seeing Barb yesterday, provided caloric intake. 3. He understands he will see Dr.Squire tomorrow.  Gayleen Orem, RN, BSN, [place] at [place] [phone]

## 2014-07-09 ENCOUNTER — Ambulatory Visit
Admission: RE | Admit: 2014-07-09 | Discharge: 2014-07-09 | Disposition: A | Discharge status: Home / Self Care | Payer: Medicare Other | Source: Ambulatory Visit | Attending: Radiation Oncology | Admitting: Radiation Oncology

## 2014-07-09 ENCOUNTER — Encounter: Payer: Self-pay | Admitting: Nutrition

## 2014-07-09 ENCOUNTER — Ambulatory Visit: Payer: Medicare Other

## 2014-07-09 ENCOUNTER — Encounter: Payer: Self-pay | Admitting: *Deleted

## 2014-07-09 ENCOUNTER — Encounter: Payer: Self-pay | Admitting: Radiation Oncology

## 2014-07-09 DIAGNOSIS — Z952 Presence of prosthetic heart valve: Secondary | ICD-10-CM | POA: Diagnosis not present

## 2014-07-09 DIAGNOSIS — R634 Abnormal weight loss: Secondary | ICD-10-CM | POA: Diagnosis not present

## 2014-07-09 DIAGNOSIS — I10 Essential (primary) hypertension: Secondary | ICD-10-CM | POA: Diagnosis not present

## 2014-07-09 DIAGNOSIS — R1310 Dysphagia, unspecified: Secondary | ICD-10-CM | POA: Diagnosis not present

## 2014-07-09 DIAGNOSIS — K1233 Oral mucositis (ulcerative) due to radiation: Secondary | ICD-10-CM | POA: Diagnosis not present

## 2014-07-09 DIAGNOSIS — I951 Orthostatic hypotension: Secondary | ICD-10-CM | POA: Diagnosis not present

## 2014-07-09 DIAGNOSIS — K219 Gastro-esophageal reflux disease without esophagitis: Secondary | ICD-10-CM | POA: Diagnosis not present

## 2014-07-09 DIAGNOSIS — Z791 Long term (current) use of non-steroidal anti-inflammatories (NSAID): Secondary | ICD-10-CM | POA: Diagnosis not present

## 2014-07-09 DIAGNOSIS — L599 Disorder of the skin and subcutaneous tissue related to radiation, unspecified: Secondary | ICD-10-CM | POA: Diagnosis not present

## 2014-07-09 DIAGNOSIS — Z7982 Long term (current) use of aspirin: Secondary | ICD-10-CM | POA: Diagnosis not present

## 2014-07-09 DIAGNOSIS — E785 Hyperlipidemia, unspecified: Secondary | ICD-10-CM | POA: Diagnosis not present

## 2014-07-09 DIAGNOSIS — G510 Bell's palsy: Secondary | ICD-10-CM | POA: Diagnosis not present

## 2014-07-09 DIAGNOSIS — Z9049 Acquired absence of other specified parts of digestive tract: Secondary | ICD-10-CM | POA: Diagnosis not present

## 2014-07-09 DIAGNOSIS — R5383 Other fatigue: Secondary | ICD-10-CM | POA: Diagnosis not present

## 2014-07-09 DIAGNOSIS — R070 Pain in throat: Secondary | ICD-10-CM | POA: Diagnosis not present

## 2014-07-09 DIAGNOSIS — C7989 Secondary malignant neoplasm of other specified sites: Secondary | ICD-10-CM | POA: Diagnosis not present

## 2014-07-09 DIAGNOSIS — Z79899 Other long term (current) drug therapy: Secondary | ICD-10-CM | POA: Diagnosis not present

## 2014-07-09 DIAGNOSIS — C4442 Squamous cell carcinoma of skin of scalp and neck: Secondary | ICD-10-CM | POA: Diagnosis not present

## 2014-07-09 DIAGNOSIS — Z6826 Body mass index (BMI) 26.0-26.9, adult: Secondary | ICD-10-CM | POA: Diagnosis not present

## 2014-07-09 DIAGNOSIS — T66XXXS Radiation sickness, unspecified, sequela: Secondary | ICD-10-CM | POA: Diagnosis not present

## 2014-07-09 DIAGNOSIS — Z510 Encounter for antineoplastic radiation therapy: Secondary | ICD-10-CM | POA: Diagnosis not present

## 2014-07-09 DIAGNOSIS — K5900 Constipation, unspecified: Secondary | ICD-10-CM | POA: Diagnosis not present

## 2014-07-09 DIAGNOSIS — K117 Disturbances of salivary secretion: Secondary | ICD-10-CM | POA: Diagnosis not present

## 2014-07-09 DIAGNOSIS — Z85828 Personal history of other malignant neoplasm of skin: Secondary | ICD-10-CM | POA: Diagnosis not present

## 2014-07-09 DIAGNOSIS — E039 Hypothyroidism, unspecified: Secondary | ICD-10-CM | POA: Diagnosis not present

## 2014-07-09 DIAGNOSIS — C4440 Unspecified malignant neoplasm of skin of scalp and neck: Secondary | ICD-10-CM

## 2014-07-09 DIAGNOSIS — C4441 Basal cell carcinoma of skin of scalp and neck: Secondary | ICD-10-CM | POA: Diagnosis not present

## 2014-07-09 MED ORDER — BIAFINE EX EMUL
Freq: Two times a day (BID) | CUTANEOUS | Status: DC
  Administered 2014-07-09: 11:00:00 via TOPICAL

## 2014-07-09 NOTE — Progress Notes (Signed)
Patient denies pain today, is wearing Duragesic patch. He took Hydrocodone and Zofran prior to treatment today. He uses Lidocaine rinse as well with good relief of throat/mouth pain. He states he feels stronger since receiving IVF yesterday. He is drinking Ensure, Boost daily total 1200 calories per [employer]. He is not eating foods. He is applying Biafine to neck treatment area for dry desquamation, hyper pigmentation.  Gave him another tube today.

## 2014-07-09 NOTE — Progress Notes (Signed)
Patient did not show up for nutrition follow-up. 

## 2014-07-09 NOTE — Progress Notes (Signed)
   Weekly Management Note:  Outpatient    ICD-9-CM ICD-10-CM   1. Skin cancer of scalp or skin of neck 173.41 C44.41 topical emolient (BIAFINE) emulsion    Current Dose:  40 Gy  Projected Dose: 66 Gy   Narrative:  The patient presents for routine under treatment assessment.  CBCT/MVCT images/Port film x-rays were reviewed.  The chart was checked. Patient denies pain today, is wearing Duragesic patch. He took Hydrocodone and Zofran prior to treatment today. He uses Lidocaine rinse as well with good relief of throat/mouth pain. He states he feels stronger since receiving IVF (1L) yesterday. He is drinking Ensure, Boost daily total 1200 calories per [employer]. He is not eating foods. He is applying Biafine to neck treatment area for dry desquamation, hyper pigmentation.  Gave him another tube today.  Physical Findings:  weight is 175 lb 6.4 oz (79.561 kg). His oral temperature is 97.5 F (36.4 C). His blood pressure is 112/62 and his pulse is 66. His respiration is 20.   Wt Readings from Last 3 Encounters:  07/07/14 172 lb 3.2 oz (78.109 kg)  06/30/14 183 lb 8 oz (83.235 kg)  06/30/14 185 lb (83.915 kg)   Stable erythema/mucositis in oral cavity, which is dry.  Dry desquamation of skin in RT fields of right face/neck.  BMP Latest Ref Rng 07/08/2014 06/24/2014 03/03/2014  Glucose 70 - 140 mg/dl 65(L) 85 98  BUN 7.0 - 26.0 mg/dL 27.2(H) 14.2 14  Creatinine 0.7 - 1.3 mg/dL 1.0 0.9 0.90  Sodium 136 - 145 mEq/L 142 141 139  Potassium 3.5 - 5.1 mEq/L 3.6 4.8 4.5  Chloride 96 - 112 mEq/L - - 103  CO2 22 - 29 mEq/L 26 28 27   Calcium 8.4 - 10.4 mg/dL 9.3 9.1 8.7     Impression:  The patient is tolerating radiotherapy.  Plan:  Continue radiotherapy as planned. Repeat IV fluids tomorrow.  Repeat BMP on 2/8 before next visit.  Push PO intake of Boost juices and water.  He is doing much better.  Letter written for fine r/t Handicapped placard expiring.  ________________________________   [name], M.D.

## 2014-07-10 ENCOUNTER — Ambulatory Visit: Payer: Medicare Other

## 2014-07-10 ENCOUNTER — Ambulatory Visit (HOSPITAL_COMMUNITY)
Admission: RE | Admit: 2014-07-10 | Discharge: 2014-07-10 | Disposition: A | Discharge status: Home / Self Care | Payer: Medicare Other | Source: Ambulatory Visit | Attending: Radiation Oncology | Admitting: Radiation Oncology

## 2014-07-10 ENCOUNTER — Encounter: Payer: Self-pay | Admitting: *Deleted

## 2014-07-10 ENCOUNTER — Ambulatory Visit
Admission: RE | Admit: 2014-07-10 | Discharge: 2014-07-10 | Disposition: A | Discharge status: Home / Self Care | Payer: Medicare Other | Source: Ambulatory Visit | Attending: Radiation Oncology | Admitting: Radiation Oncology

## 2014-07-10 ENCOUNTER — Other Ambulatory Visit: Payer: Self-pay | Admitting: Internal Medicine

## 2014-07-10 DIAGNOSIS — K1233 Oral mucositis (ulcerative) due to radiation: Secondary | ICD-10-CM | POA: Diagnosis not present

## 2014-07-10 DIAGNOSIS — I951 Orthostatic hypotension: Secondary | ICD-10-CM | POA: Diagnosis not present

## 2014-07-10 DIAGNOSIS — E039 Hypothyroidism, unspecified: Secondary | ICD-10-CM | POA: Diagnosis not present

## 2014-07-10 DIAGNOSIS — K117 Disturbances of salivary secretion: Secondary | ICD-10-CM | POA: Diagnosis not present

## 2014-07-10 DIAGNOSIS — R5383 Other fatigue: Secondary | ICD-10-CM | POA: Diagnosis not present

## 2014-07-10 DIAGNOSIS — G510 Bell's palsy: Secondary | ICD-10-CM | POA: Diagnosis not present

## 2014-07-10 DIAGNOSIS — Z952 Presence of prosthetic heart valve: Secondary | ICD-10-CM | POA: Diagnosis not present

## 2014-07-10 DIAGNOSIS — L599 Disorder of the skin and subcutaneous tissue related to radiation, unspecified: Secondary | ICD-10-CM | POA: Diagnosis not present

## 2014-07-10 DIAGNOSIS — R070 Pain in throat: Secondary | ICD-10-CM | POA: Diagnosis not present

## 2014-07-10 DIAGNOSIS — I10 Essential (primary) hypertension: Secondary | ICD-10-CM | POA: Diagnosis not present

## 2014-07-10 DIAGNOSIS — Z7982 Long term (current) use of aspirin: Secondary | ICD-10-CM | POA: Diagnosis not present

## 2014-07-10 DIAGNOSIS — K219 Gastro-esophageal reflux disease without esophagitis: Secondary | ICD-10-CM | POA: Diagnosis not present

## 2014-07-10 DIAGNOSIS — E785 Hyperlipidemia, unspecified: Secondary | ICD-10-CM | POA: Diagnosis not present

## 2014-07-10 DIAGNOSIS — Z6826 Body mass index (BMI) 26.0-26.9, adult: Secondary | ICD-10-CM | POA: Diagnosis not present

## 2014-07-10 DIAGNOSIS — T66XXXS Radiation sickness, unspecified, sequela: Secondary | ICD-10-CM | POA: Diagnosis not present

## 2014-07-10 DIAGNOSIS — R1310 Dysphagia, unspecified: Secondary | ICD-10-CM | POA: Diagnosis not present

## 2014-07-10 DIAGNOSIS — R634 Abnormal weight loss: Secondary | ICD-10-CM | POA: Diagnosis not present

## 2014-07-10 DIAGNOSIS — K5900 Constipation, unspecified: Secondary | ICD-10-CM | POA: Diagnosis not present

## 2014-07-10 DIAGNOSIS — Z79899 Other long term (current) drug therapy: Secondary | ICD-10-CM | POA: Diagnosis not present

## 2014-07-10 DIAGNOSIS — C4442 Squamous cell carcinoma of skin of scalp and neck: Secondary | ICD-10-CM | POA: Diagnosis not present

## 2014-07-10 DIAGNOSIS — C7989 Secondary malignant neoplasm of other specified sites: Secondary | ICD-10-CM | POA: Diagnosis not present

## 2014-07-10 DIAGNOSIS — C4441 Basal cell carcinoma of skin of scalp and neck: Secondary | ICD-10-CM | POA: Diagnosis not present

## 2014-07-10 DIAGNOSIS — C4440 Unspecified malignant neoplasm of skin of scalp and neck: Secondary | ICD-10-CM

## 2014-07-10 DIAGNOSIS — Z791 Long term (current) use of non-steroidal anti-inflammatories (NSAID): Secondary | ICD-10-CM | POA: Diagnosis not present

## 2014-07-10 DIAGNOSIS — Z510 Encounter for antineoplastic radiation therapy: Secondary | ICD-10-CM | POA: Diagnosis not present

## 2014-07-10 DIAGNOSIS — Z85828 Personal history of other malignant neoplasm of skin: Secondary | ICD-10-CM | POA: Diagnosis not present

## 2014-07-10 DIAGNOSIS — Z9049 Acquired absence of other specified parts of digestive tract: Secondary | ICD-10-CM | POA: Diagnosis not present

## 2014-07-10 MED ORDER — POTASSIUM CHLORIDE 2 MEQ/ML IV SOLN
INTRAVENOUS | Status: AC
  Administered 2014-07-10: 11:00:00 via INTRAVENOUS
  Filled 2014-07-10: qty 1000

## 2014-07-10 NOTE — Progress Notes (Signed)
Arranged IVF at [facility] for this morning after RT, informed/directed patient.  Gayleen Orem, RN, BSN, [place] at [place] [phone]

## 2014-07-10 NOTE — Procedures (Signed)
[facility]  Procedure Note  JORAM VENSON WLS:[id number] DOB: 1927-09-22 DOA: 07/10/2014   Dr. Isidore Moos  Associated Diagnosis: Skin cancer of scalp or skin of neck Procedure Note: IV started, IV fluids infused per order, IV discontinued   Condition During Procedure:  Patient stable, denies any discomforts   Condition at Discharge:  Patient ambulatory at discharge.   Roberto Scales, RN  [facility]

## 2014-07-10 NOTE — Progress Notes (Signed)
To provide support and encouragement, care continuity and to assess for needs, met with patient after RT and during Weekly UT with Dr. Isidore Moos.  His wife and dtr Patty accompanied him. 1. He reported:  Felt much better after receiving IVF this past Tuesday.  Difficulty eating solids because of "repulsive" taste.  Drinking 1200 calories of supplement daily.  Dr. Isidore Moos encouraged him to increase as able.  Absence of pain. 2. He understands that he will receive IVF tomorrow, labs on Monday, potentially additional IVF next week.  Gayleen Orem, RN, BSN, [place] at [place] [phone]

## 2014-07-11 ENCOUNTER — Ambulatory Visit: Payer: Medicare Other

## 2014-07-11 ENCOUNTER — Ambulatory Visit
Admission: RE | Admit: 2014-07-11 | Discharge: 2014-07-11 | Disposition: A | Discharge status: Home / Self Care | Payer: Medicare Other | Source: Ambulatory Visit | Attending: Radiation Oncology | Admitting: Radiation Oncology

## 2014-07-11 ENCOUNTER — Encounter: Payer: Self-pay | Admitting: *Deleted

## 2014-07-11 DIAGNOSIS — G510 Bell's palsy: Secondary | ICD-10-CM | POA: Diagnosis not present

## 2014-07-11 DIAGNOSIS — K5900 Constipation, unspecified: Secondary | ICD-10-CM | POA: Diagnosis not present

## 2014-07-11 DIAGNOSIS — I951 Orthostatic hypotension: Secondary | ICD-10-CM | POA: Diagnosis not present

## 2014-07-11 DIAGNOSIS — R1310 Dysphagia, unspecified: Secondary | ICD-10-CM | POA: Diagnosis not present

## 2014-07-11 DIAGNOSIS — Z7982 Long term (current) use of aspirin: Secondary | ICD-10-CM | POA: Diagnosis not present

## 2014-07-11 DIAGNOSIS — Z79899 Other long term (current) drug therapy: Secondary | ICD-10-CM | POA: Diagnosis not present

## 2014-07-11 DIAGNOSIS — E039 Hypothyroidism, unspecified: Secondary | ICD-10-CM | POA: Diagnosis not present

## 2014-07-11 DIAGNOSIS — Z6826 Body mass index (BMI) 26.0-26.9, adult: Secondary | ICD-10-CM | POA: Diagnosis not present

## 2014-07-11 DIAGNOSIS — C4441 Basal cell carcinoma of skin of scalp and neck: Secondary | ICD-10-CM | POA: Diagnosis not present

## 2014-07-11 DIAGNOSIS — R070 Pain in throat: Secondary | ICD-10-CM | POA: Diagnosis not present

## 2014-07-11 DIAGNOSIS — Z9049 Acquired absence of other specified parts of digestive tract: Secondary | ICD-10-CM | POA: Diagnosis not present

## 2014-07-11 DIAGNOSIS — K219 Gastro-esophageal reflux disease without esophagitis: Secondary | ICD-10-CM | POA: Diagnosis not present

## 2014-07-11 DIAGNOSIS — Z510 Encounter for antineoplastic radiation therapy: Secondary | ICD-10-CM | POA: Diagnosis not present

## 2014-07-11 DIAGNOSIS — Z952 Presence of prosthetic heart valve: Secondary | ICD-10-CM | POA: Diagnosis not present

## 2014-07-11 DIAGNOSIS — L599 Disorder of the skin and subcutaneous tissue related to radiation, unspecified: Secondary | ICD-10-CM | POA: Diagnosis not present

## 2014-07-11 DIAGNOSIS — T66XXXS Radiation sickness, unspecified, sequela: Secondary | ICD-10-CM | POA: Diagnosis not present

## 2014-07-11 DIAGNOSIS — E785 Hyperlipidemia, unspecified: Secondary | ICD-10-CM | POA: Diagnosis not present

## 2014-07-11 DIAGNOSIS — K117 Disturbances of salivary secretion: Secondary | ICD-10-CM | POA: Diagnosis not present

## 2014-07-11 DIAGNOSIS — Z85828 Personal history of other malignant neoplasm of skin: Secondary | ICD-10-CM | POA: Diagnosis not present

## 2014-07-11 DIAGNOSIS — R5383 Other fatigue: Secondary | ICD-10-CM | POA: Diagnosis not present

## 2014-07-11 DIAGNOSIS — C4442 Squamous cell carcinoma of skin of scalp and neck: Secondary | ICD-10-CM | POA: Diagnosis not present

## 2014-07-11 DIAGNOSIS — Z791 Long term (current) use of non-steroidal anti-inflammatories (NSAID): Secondary | ICD-10-CM | POA: Diagnosis not present

## 2014-07-11 DIAGNOSIS — K1233 Oral mucositis (ulcerative) due to radiation: Secondary | ICD-10-CM | POA: Diagnosis not present

## 2014-07-11 DIAGNOSIS — C7989 Secondary malignant neoplasm of other specified sites: Secondary | ICD-10-CM | POA: Diagnosis not present

## 2014-07-11 DIAGNOSIS — I10 Essential (primary) hypertension: Secondary | ICD-10-CM | POA: Diagnosis not present

## 2014-07-11 DIAGNOSIS — R634 Abnormal weight loss: Secondary | ICD-10-CM | POA: Diagnosis not present

## 2014-07-11 NOTE — Progress Notes (Signed)
Printed Epic calendar with appts, took to [facility] for their delivery to patient.  Gayleen Orem, RN, BSN, [place] at [place] [phone]

## 2014-07-14 ENCOUNTER — Ambulatory Visit
Admission: RE | Admit: 2014-07-14 | Discharge: 2014-07-14 | Disposition: A | Discharge status: Home / Self Care | Payer: Medicare Other | Source: Ambulatory Visit | Attending: Radiation Oncology | Admitting: Radiation Oncology

## 2014-07-14 ENCOUNTER — Encounter: Payer: Self-pay | Admitting: Radiation Oncology

## 2014-07-14 ENCOUNTER — Ambulatory Visit: Payer: Medicare Other

## 2014-07-14 ENCOUNTER — Other Ambulatory Visit: Payer: Self-pay | Admitting: Radiation Oncology

## 2014-07-14 ENCOUNTER — Telehealth: Payer: Self-pay | Admitting: *Deleted

## 2014-07-14 ENCOUNTER — Other Ambulatory Visit: Payer: Self-pay | Admitting: Internal Medicine

## 2014-07-14 VITALS — BP 98/60 | HR 72 | Resp 22 | Wt 171.2 lb

## 2014-07-14 VITALS — BP 113/61 | HR 64 | Temp 97.7°F | Resp 24

## 2014-07-14 DIAGNOSIS — K5900 Constipation, unspecified: Secondary | ICD-10-CM | POA: Diagnosis not present

## 2014-07-14 DIAGNOSIS — R634 Abnormal weight loss: Secondary | ICD-10-CM | POA: Diagnosis not present

## 2014-07-14 DIAGNOSIS — I951 Orthostatic hypotension: Secondary | ICD-10-CM | POA: Diagnosis not present

## 2014-07-14 DIAGNOSIS — Z85828 Personal history of other malignant neoplasm of skin: Secondary | ICD-10-CM | POA: Diagnosis not present

## 2014-07-14 DIAGNOSIS — Z515 Encounter for palliative care: Secondary | ICD-10-CM

## 2014-07-14 DIAGNOSIS — E785 Hyperlipidemia, unspecified: Secondary | ICD-10-CM | POA: Diagnosis not present

## 2014-07-14 DIAGNOSIS — C4440 Unspecified malignant neoplasm of skin of scalp and neck: Secondary | ICD-10-CM

## 2014-07-14 DIAGNOSIS — R070 Pain in throat: Secondary | ICD-10-CM | POA: Diagnosis not present

## 2014-07-14 DIAGNOSIS — R5383 Other fatigue: Secondary | ICD-10-CM | POA: Diagnosis not present

## 2014-07-14 DIAGNOSIS — Z0279 Encounter for issue of other medical certificate: Secondary | ICD-10-CM

## 2014-07-14 DIAGNOSIS — Z510 Encounter for antineoplastic radiation therapy: Secondary | ICD-10-CM | POA: Diagnosis not present

## 2014-07-14 DIAGNOSIS — Z9049 Acquired absence of other specified parts of digestive tract: Secondary | ICD-10-CM | POA: Diagnosis not present

## 2014-07-14 DIAGNOSIS — Z7982 Long term (current) use of aspirin: Secondary | ICD-10-CM | POA: Diagnosis not present

## 2014-07-14 DIAGNOSIS — Z79899 Other long term (current) drug therapy: Secondary | ICD-10-CM | POA: Diagnosis not present

## 2014-07-14 DIAGNOSIS — Z6826 Body mass index (BMI) 26.0-26.9, adult: Secondary | ICD-10-CM | POA: Diagnosis not present

## 2014-07-14 DIAGNOSIS — Z952 Presence of prosthetic heart valve: Secondary | ICD-10-CM | POA: Diagnosis not present

## 2014-07-14 DIAGNOSIS — T66XXXS Radiation sickness, unspecified, sequela: Secondary | ICD-10-CM | POA: Diagnosis not present

## 2014-07-14 DIAGNOSIS — K1233 Oral mucositis (ulcerative) due to radiation: Secondary | ICD-10-CM | POA: Diagnosis not present

## 2014-07-14 DIAGNOSIS — R531 Weakness: Secondary | ICD-10-CM | POA: Diagnosis not present

## 2014-07-14 DIAGNOSIS — L599 Disorder of the skin and subcutaneous tissue related to radiation, unspecified: Secondary | ICD-10-CM | POA: Diagnosis not present

## 2014-07-14 DIAGNOSIS — Z7189 Other specified counseling: Secondary | ICD-10-CM

## 2014-07-14 DIAGNOSIS — K117 Disturbances of salivary secretion: Secondary | ICD-10-CM | POA: Diagnosis not present

## 2014-07-14 DIAGNOSIS — G510 Bell's palsy: Secondary | ICD-10-CM | POA: Diagnosis not present

## 2014-07-14 DIAGNOSIS — C7989 Secondary malignant neoplasm of other specified sites: Secondary | ICD-10-CM | POA: Diagnosis not present

## 2014-07-14 DIAGNOSIS — C4441 Basal cell carcinoma of skin of scalp and neck: Secondary | ICD-10-CM | POA: Diagnosis not present

## 2014-07-14 DIAGNOSIS — C4442 Squamous cell carcinoma of skin of scalp and neck: Secondary | ICD-10-CM | POA: Diagnosis not present

## 2014-07-14 DIAGNOSIS — K219 Gastro-esophageal reflux disease without esophagitis: Secondary | ICD-10-CM | POA: Diagnosis not present

## 2014-07-14 DIAGNOSIS — I10 Essential (primary) hypertension: Secondary | ICD-10-CM | POA: Diagnosis not present

## 2014-07-14 DIAGNOSIS — Z791 Long term (current) use of non-steroidal anti-inflammatories (NSAID): Secondary | ICD-10-CM | POA: Diagnosis not present

## 2014-07-14 DIAGNOSIS — R1310 Dysphagia, unspecified: Secondary | ICD-10-CM | POA: Diagnosis not present

## 2014-07-14 DIAGNOSIS — E039 Hypothyroidism, unspecified: Secondary | ICD-10-CM | POA: Diagnosis not present

## 2014-07-14 LAB — BASIC METABOLIC PANEL (CC13)
ANION GAP: 12 meq/L — AB (ref 3–11)
BUN: 25 mg/dL (ref 7.0–26.0)
CO2: 24 mEq/L (ref 22–29)
Calcium: 9.9 mg/dL (ref 8.4–10.4)
Chloride: 106 mEq/L (ref 98–109)
Creatinine: 1 mg/dL (ref 0.7–1.3)
EGFR: 72 mL/min/{1.73_m2} — ABNORMAL LOW (ref >90)
Glucose: 97 mg/dl (ref 70–140)
Potassium: 3.7 mEq/L (ref 3.5–5.1)
Sodium: 143 mEq/L (ref 136–145)

## 2014-07-14 MED ORDER — FENTANYL 37.5 MCG/HR TD PT72: 37.5000 ug | MEDICATED_PATCH | TRANSDERMAL | Status: DC

## 2014-07-14 MED ORDER — POTASSIUM CHLORIDE 2 MEQ/ML IV SOLN
Freq: Once | INTRAVENOUS | Status: AC
  Administered 2014-07-14: 15:00:00 via INTRAVENOUS
  Filled 2014-07-14: qty 1000

## 2014-07-14 NOTE — Patient Instructions (Signed)

## 2014-07-14 NOTE — Consult Note (Signed)
Patient Adrian West      DOB: 1928/04/07      FIE:[id number]     Consult Note from the Palliative Medicine Team at [place] Requested by:  Dr Isidore Moos    PCP: [name], MD Reason for Consultation: Introduction to Palliative [employer] and its role in holistic patient care                                                                                         Phone                                                                                                                                                                                              [phone]  Assessment of patients Current state:  Patient continues to struggle with the physical and functional side effects of his radiation treatments while processing the emotional components of living with serious life limiting illness and facing one's mortality.    Consult is for introduction to the concept of Palliative Medicine, clarification of Advanced Directives,  holistic support and symptom management as indicated.  This NP Wadie Lessen reviewed medical records, received report from team, assessed the patient and then meet with  Mr Pistole (he was by himself today)  in the outpatient oncology clinic.  A detailed discussion was had today regarding advanced directives.   Values and goals of care important to patient  were attempted to be elicited.  Concept of Palliative Care was discussed  Questions and concerns addressed. Patient encouraged to call with questions or concerns.  PMT will continue to support holistically.  Encouraged patient to bring a support person to his medical appointments  Goals of Care: 1.  Code Status:  Full code at this time     -Encouraged to consider DNR/DNI status knowing the poor outcomes in similar patients     2. Scope of Treatment:  At this time patietn is open to all availble and offered medical interventions to prolong quality life.  He hopes at the completion of his  radiation treatment he "regains his strength" Discussed the  importance of  making medical intervention decisions based on intention of each specific treatment/intervention.    4. Symptom Management:   1. Fatigue: -Pace  yourself -Plan your day -Include naps and breaks -schedule a relaxing day -get a little exercise -fuel the body -consider complementary therapies   -deep breathing   -prayer/medication   -guided meditation  2. Pain:  Continue Fentanly patch as previously written ("working well")  3.  Weight loss, poor appetite    -discussed importance of hydration -discussed concept of frequent small meals vs three standard meals -discussed soft, cool foods for easier consumption  5. Psychosocial:  Emotional support offered to patient, created space and opportunity for Mr Simonetti to  [facility] his thoughts and feelings regarding his serious illness, he is focused on his mixed feelings regarding his radiation treatments and "hopes it is worth it"    Brief HPI:  BALTAZAR PEKALA is a 81 y.o. 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 He reports "routine" skin cancer excisions over the years of the scalp as well. He developed gradual right facial nerve paralysis over the summer 2015, originally thought to be Bell's Palsy per history and MRI on 01-15-14 w/ contrast. No mass seen at that time, but right facial nerve was enhancing. MRI in 03/07/14 then showed progressive change suspicious for right parotid tumor and right facial nerve involvement.  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   Nonpathologically enlarged bilateral cervical nodes, non-malignant per PET criteria.  No PET evidence of distant metastases.  04/25/14  S/P surgical interventions   Currently under radiaton treatments per Dr  Isidore Moos  ROS: sore mouth and difficulty swallowing, continued weight loss,  weakness and fatigue    PMH:  Past Medical History  Diagnosis Date  . Thyroid disease   . Hypertension   . GERD (gastroesophageal reflux disease)     PMH of stricture , recurrent;[name]  . Benign neoplasm of colon   . Tremor, essential   . Diverticulosis   . TIA (transient ischemic attack) May 2012  . Skin cancer   . Neuropathy, peripheral   . Lung abnormality 11/25/2008    "nerve damage from heart surgery"  . Hyperlipidemia   . MGUS (monoclonal gammopathy of unknown significance) 04/11/2013  . Diaphragm paralysis   . Esophageal stricture   . Hiatal hernia   . Bell's palsy   . Vertigo 2005  . Gait disturbance      PSH: Past Surgical History  Procedure Laterality Date  . Appendectomy    . Cataract extraction      [name]  . Cholecystectomy  1998    [name]  . Inguinal hernia repair    . Lumbar laminectomy    . Nasal sinus surgery    . Tonsillectomy    . Esophageal dilation       X3;[name]  . 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 Laparoscopic ablation renal mass  08/14/2009  . Mohs surgery  2012     ear  . Tilt table study N/A 05/06/2013    Procedure: TILT TABLE STUDY;  Surgeon: [name], MD;  Location: [facility] C[facility] LAB;  Service: Cardiovascular;  Laterality: N/A;  . Right heart catheterization N/A 03/03/2014    Procedure: RIGHT HEART CATH;  Surgeon: [name], MD;  Location: [facility] CATH LAB;  Service: Cardiovascular;  Laterality: N/A;   I have reviewed the [place]  and SH and  If appropriate update it with new information. No Known Allergies Scheduled Meds: Continuous Infusions: PRN Meds:.    There were no vitals taken for this visit.     No intake or output data in the 24 hours ending 07/14/14 0951   Physical Exam:  General: chronically ill appearing, alert and oriented X3 HEENT:  Noted surgical and  treatment physical and skin changes -right eye lower lid droop   Labs: CBC    Component Value Date/Time   WBC 4.2 06/24/2014 1019   WBC 7.2 03/31/2014 1140   RBC 4.83 06/24/2014 1019   RBC 4.79 03/31/2014 1140   HGB 13.6 06/24/2014 1019   HGB 15.0 03/31/2014 1140   HCT 42.9 06/24/2014 1019   HCT 44.9 03/31/2014 1140   PLT 231 06/24/2014 1019   PLT 326 03/31/2014 1140   MCV 88.8 06/24/2014 1019   MCV 93.7 03/31/2014 1140   MCH 28.2 06/24/2014 1019   MCH 31.3 03/31/2014 1140   MCHC 31.7* 06/24/2014 1019   MCHC 33.4 03/31/2014 1140   RDW 13.5 06/24/2014 1019   RDW 13.0 03/31/2014 1140   LYMPHSABS 0.6* 06/24/2014 1019   LYMPHSABS 0.8 03/31/2014 1140   MONOABS 0.4 06/24/2014 1019   MONOABS 0.3 03/31/2014 1140   EOSABS 0.2 06/24/2014 1019   EOSABS 0.1 03/31/2014 1140   BASOSABS 0.0 06/24/2014 1019   BASOSABS 0.0 03/31/2014 1140    BMET    Component Value Date/Time   NA 142 07/08/2014 0846   NA 139 03/03/2014 1020   K 3.6 07/08/2014 0846   K 4.5 03/03/2014 1020   CL 103 03/03/2014 1020   CO2 26 07/08/2014 0846   CO2 27 03/03/2014 1020   GLUCOSE 65* 07/08/2014 0846   GLUCOSE 98 03/03/2014 1020   BUN 27.2* 07/08/2014 0846   BUN 14 03/03/2014 1020   CREATININE 1.0 07/08/2014 0846   CREATININE 0.90 03/03/2014 1020   CREATININE 1.20 10/12/2010 1257   CALCIUM 9.3 07/08/2014 0846   CALCIUM 8.7 03/03/2014 1020   GFRNONAA 76* 03/03/2014 1020   GFRAA 88* 03/03/2014 1020    CMP     Component Value Date/Time   NA 142 07/08/2014 0846   NA 139 03/03/2014 1020   K 3.6 07/08/2014 0846   K 4.5 03/03/2014 1020   CL 103 03/03/2014 1020   CO2 26 07/08/2014 0846   CO2 27 03/03/2014 1020   GLUCOSE 65* 07/08/2014 0846   GLUCOSE 98 03/03/2014 1020   BUN 27.2* 07/08/2014 0846   BUN 14 03/03/2014 1020   CREATININE 1.0 07/08/2014 0846   CREATININE 0.90 03/03/2014 1020   CREATININE 1.20 10/12/2010 1257   CALCIUM 9.3 07/08/2014 0846   CALCIUM 8.7 03/03/2014 1020   PROT 5.6*  10/22/2013 1218   ALBUMIN 3.0* 10/22/2013 1218   AST 21 10/22/2013 1218   ALT 23 10/22/2013 1218   ALKPHOS 60 10/22/2013 1218   BILITOT 0.5 10/22/2013 1218   GFRNONAA 76* 03/03/2014 1020   GFRAA 88* 03/03/2014 1020   ECOG PERFORMANCE STATUS* (Eastern Cooperative Oncology Group)  0 Fully active, able to continue with all pre-disease activities without restriction. Pt score  1 Restricted in physically strenuous activity but ambulatory and able to carry out work of a light or sedentary nature, e.g., light house work, office work.   2 Ambulatory and capable of all self-care but unable to carry out any work activities. Up and about more than 50% of waking hours.  2  3 Capable of only  limited self-care. Confined to bed or chair more than 50% of waking hours.   4 Completely disabled. Cannot carry on any self-care. Totally confined to bed or chair.   5 Dead.    As published in Am. J. Clin. Oncol.: Eustace Pen, M.M., Colon Flattery., Bern, D.C., Horton, Sharen Hint., Drexel Iha, P.P.: Toxicity And Response Criteria Of The [facility]. [place] [id number], 1982.  The ECOG Performance Status is in the public domain therefore available for public use. To duplicate the scale, please cite the reference above and credit the [facility], Tyler Pita M.D., Group Chair    Time In Time Out Total Time Spent with Patient Total Overall Time  0900 1015 70 min 75 min    Greater than 50%  of this time was spent counseling and coordinating care related to the above assessment and plan.  Plan to meet again next week (1000) and patient's wife will accompany him.   Wadie Lessen NP  Palliative Medicine Team Team Phone # [phone] Pager [phone]  Discussed with Dr Isidore Moos

## 2014-07-14 NOTE — Progress Notes (Signed)
Patient reports pain 8/10 in his mouth, throat and tip of tongue. He only takes Hydrocodone once a day, states he doesn't feel it's very helpful. He is wearing Duragesic patch, needs refill. He states it was due to be changed yesterday. He uses Lidocaine for sore throat with fair relief. He has lost 4 lbs in past week, states he is barely getting in 1200 calories a day, is not eating any foods. He states he is "somewhat dizzy" today. He is applying Biafine to neck for erythema, has small area moist desquamation on lower right side of neck; advised Dr Isidore Moos may want him to apply antibiotic ointment in this area. Orthostatic VS taken. Sitting HR 63  BP 97/57, standing HR 72  BP 98/60 Patient did not get labs this morning, states he forgot.

## 2014-07-14 NOTE — Telephone Encounter (Signed)
Per Stanton Kidney from radiation I have scheduled ivf three days this week

## 2014-07-14 NOTE — Progress Notes (Signed)
Weekly Management Note:  Outpatient    ICD-9-CM ICD-10-CM   1. Skin cancer of scalp or skin of neck 173.41 C44.41 FentaNYL 37.5 MCG/HR PT72    Current Dose:  46 Gy  Projected Dose: 66 Gy   Narrative:  The patient presents for routine under treatment assessment.  CBCT/MVCT images/Port film x-rays were reviewed.  The chart was checked. Patient reports pain 8/10 in his mouth, throat and tip of tongue. He only takes Hydrocodone once a day, states he doesn't feel it's very helpful. He is wearing Duragesic patch, needs refill. He states it was due to be changed yesterday. He uses Lidocaine for sore throat with fair relief. He has lost 4 lbs in past week, states he is barely getting in 1200 calories a day, is not eating any foods. He states he is "somewhat dizzy" today. He is applying Biafine to neck for erythema. Orthostatic VS taken. Sitting HR 63  BP 97/57, standing HR 72  BP 98/60 Patient did not get labs this morning, states he forgot. Has some dysphagia with nutritional drinks.   Physical Findings:  Wt Readings from Last 3 Encounters:  07/14/14 171 lb 3.2 oz (77.656 kg)  07/09/14 175 lb 6.4 oz (79.561 kg)  07/07/14 172 lb 3.2 oz (78.109 kg)    weight is 171 lb 3.2 oz (77.656 kg). His blood pressure is 98/60 and his pulse is 72. His respiration is 22.  Dry oral mucosa with patchy mucositis and brisk gag reflex.  Crusting desquamation over right face and neck. No LE edema. OF NOTE: patient has a 25 mcg duragesic patch on today, not 37.69mcg as prescribed most recently. Hard of hearing.  CBC    Component Value Date/Time   WBC 4.2 06/24/2014 1019   WBC 7.2 03/31/2014 1140   RBC 4.83 06/24/2014 1019   RBC 4.79 03/31/2014 1140   HGB 13.6 06/24/2014 1019   HGB 15.0 03/31/2014 1140   HCT 42.9 06/24/2014 1019   HCT 44.9 03/31/2014 1140   PLT 231 06/24/2014 1019   PLT 326 03/31/2014 1140   MCV 88.8 06/24/2014 1019   MCV 93.7 03/31/2014 1140   MCH 28.2 06/24/2014 1019   MCH 31.3  03/31/2014 1140   MCHC 31.7* 06/24/2014 1019   MCHC 33.4 03/31/2014 1140   RDW 13.5 06/24/2014 1019   RDW 13.0 03/31/2014 1140   LYMPHSABS 0.6* 06/24/2014 1019   LYMPHSABS 0.8 03/31/2014 1140   MONOABS 0.4 06/24/2014 1019   MONOABS 0.3 03/31/2014 1140   EOSABS 0.2 06/24/2014 1019   EOSABS 0.1 03/31/2014 1140   BASOSABS 0.0 06/24/2014 1019   BASOSABS 0.0 03/31/2014 1140     CMP     Component Value Date/Time   NA 142 07/08/2014 0846   NA 139 03/03/2014 1020   K 3.6 07/08/2014 0846   K 4.5 03/03/2014 1020   CL 103 03/03/2014 1020   CO2 26 07/08/2014 0846   CO2 27 03/03/2014 1020   GLUCOSE 65* 07/08/2014 0846   GLUCOSE 98 03/03/2014 1020   BUN 27.2* 07/08/2014 0846   BUN 14 03/03/2014 1020   CREATININE 1.0 07/08/2014 0846   CREATININE 0.90 03/03/2014 1020   CREATININE 1.20 10/12/2010 1257   CALCIUM 9.3 07/08/2014 0846   CALCIUM 8.7 03/03/2014 1020   PROT 5.6* 10/22/2013 1218   ALBUMIN 3.0* 10/22/2013 1218   AST 21 10/22/2013 1218   ALT 23 10/22/2013 1218   ALKPHOS 60 10/22/2013 1218   BILITOT 0.5 10/22/2013 1218   GFRNONAA 76*  03/03/2014 1020   GFRAA 88* 03/03/2014 1020     Impression:  The patient is tolerating radiotherapy with difficulty.   Plan:  Continue radiotherapy as planned. IVF today, 2/10 and 2/12.  Labs today.  OF NOTE: patient has a 25 mcg duragesic patch on today, not 37.77mcg as prescribed most recently. And, it's overdue for a change.  Rx made today for more 37.5 patches.  Printed out info on [employer] for drinks, to address dysphagia.  Refer to SLP for [facility] and consult with [place].  Patient is pleased with this plan.    Advised neosporin for areas of moist desquamation if it develops.  -----------------------------------  [name], MD

## 2014-07-15 ENCOUNTER — Ambulatory Visit
Admission: RE | Admit: 2014-07-15 | Discharge: 2014-07-15 | Disposition: A | Discharge status: Home / Self Care | Payer: Medicare Other | Source: Ambulatory Visit | Attending: Radiation Oncology | Admitting: Radiation Oncology

## 2014-07-15 ENCOUNTER — Encounter: Payer: Self-pay | Admitting: *Deleted

## 2014-07-15 DIAGNOSIS — K117 Disturbances of salivary secretion: Secondary | ICD-10-CM | POA: Diagnosis not present

## 2014-07-15 DIAGNOSIS — E039 Hypothyroidism, unspecified: Secondary | ICD-10-CM | POA: Diagnosis not present

## 2014-07-15 DIAGNOSIS — T66XXXS Radiation sickness, unspecified, sequela: Secondary | ICD-10-CM | POA: Diagnosis not present

## 2014-07-15 DIAGNOSIS — R634 Abnormal weight loss: Secondary | ICD-10-CM | POA: Diagnosis not present

## 2014-07-15 DIAGNOSIS — C4442 Squamous cell carcinoma of skin of scalp and neck: Secondary | ICD-10-CM | POA: Diagnosis not present

## 2014-07-15 DIAGNOSIS — I10 Essential (primary) hypertension: Secondary | ICD-10-CM | POA: Diagnosis not present

## 2014-07-15 DIAGNOSIS — K1233 Oral mucositis (ulcerative) due to radiation: Secondary | ICD-10-CM | POA: Diagnosis not present

## 2014-07-15 DIAGNOSIS — C7989 Secondary malignant neoplasm of other specified sites: Secondary | ICD-10-CM | POA: Diagnosis not present

## 2014-07-15 DIAGNOSIS — L599 Disorder of the skin and subcutaneous tissue related to radiation, unspecified: Secondary | ICD-10-CM | POA: Diagnosis not present

## 2014-07-15 DIAGNOSIS — K219 Gastro-esophageal reflux disease without esophagitis: Secondary | ICD-10-CM | POA: Diagnosis not present

## 2014-07-15 DIAGNOSIS — R5383 Other fatigue: Secondary | ICD-10-CM | POA: Diagnosis not present

## 2014-07-15 DIAGNOSIS — Z510 Encounter for antineoplastic radiation therapy: Secondary | ICD-10-CM | POA: Diagnosis not present

## 2014-07-15 DIAGNOSIS — Z7982 Long term (current) use of aspirin: Secondary | ICD-10-CM | POA: Diagnosis not present

## 2014-07-15 DIAGNOSIS — R070 Pain in throat: Secondary | ICD-10-CM | POA: Diagnosis not present

## 2014-07-15 DIAGNOSIS — Z791 Long term (current) use of non-steroidal anti-inflammatories (NSAID): Secondary | ICD-10-CM | POA: Diagnosis not present

## 2014-07-15 DIAGNOSIS — C4441 Basal cell carcinoma of skin of scalp and neck: Secondary | ICD-10-CM | POA: Diagnosis not present

## 2014-07-15 DIAGNOSIS — Z85828 Personal history of other malignant neoplasm of skin: Secondary | ICD-10-CM | POA: Diagnosis not present

## 2014-07-15 DIAGNOSIS — Z952 Presence of prosthetic heart valve: Secondary | ICD-10-CM | POA: Diagnosis not present

## 2014-07-15 DIAGNOSIS — R1310 Dysphagia, unspecified: Secondary | ICD-10-CM | POA: Diagnosis not present

## 2014-07-15 DIAGNOSIS — K5900 Constipation, unspecified: Secondary | ICD-10-CM | POA: Diagnosis not present

## 2014-07-15 DIAGNOSIS — Z9049 Acquired absence of other specified parts of digestive tract: Secondary | ICD-10-CM | POA: Diagnosis not present

## 2014-07-15 DIAGNOSIS — G510 Bell's palsy: Secondary | ICD-10-CM | POA: Diagnosis not present

## 2014-07-15 DIAGNOSIS — Z79899 Other long term (current) drug therapy: Secondary | ICD-10-CM | POA: Diagnosis not present

## 2014-07-15 DIAGNOSIS — I951 Orthostatic hypotension: Secondary | ICD-10-CM | POA: Diagnosis not present

## 2014-07-15 DIAGNOSIS — Z6826 Body mass index (BMI) 26.0-26.9, adult: Secondary | ICD-10-CM | POA: Diagnosis not present

## 2014-07-15 DIAGNOSIS — E785 Hyperlipidemia, unspecified: Secondary | ICD-10-CM | POA: Diagnosis not present

## 2014-07-15 NOTE — Progress Notes (Signed)
Met patient prior to RT.   I provided him an updated Epic appt calendar, explained this week's upcoming appts for IVF (including location), labs. He verbalized understanding.  Rick Diehl, RN, BSN, CHPN Head & Neck Oncology Navigator [facility] at  [phone]  

## 2014-07-16 ENCOUNTER — Ambulatory Visit: Payer: Medicare Other

## 2014-07-16 ENCOUNTER — Ambulatory Visit
Admission: RE | Admit: 2014-07-16 | Discharge: 2014-07-16 | Disposition: A | Discharge status: Home / Self Care | Payer: Medicare Other | Source: Ambulatory Visit | Attending: Radiation Oncology | Admitting: Radiation Oncology

## 2014-07-16 ENCOUNTER — Ambulatory Visit: Payer: Medicare Other | Admitting: Nutrition

## 2014-07-16 ENCOUNTER — Telehealth: Payer: Self-pay | Admitting: *Deleted

## 2014-07-16 VITALS — BP 101/46 | HR 57 | Temp 98.0°F | Resp 20

## 2014-07-16 DIAGNOSIS — R5383 Other fatigue: Secondary | ICD-10-CM | POA: Diagnosis not present

## 2014-07-16 DIAGNOSIS — Z6826 Body mass index (BMI) 26.0-26.9, adult: Secondary | ICD-10-CM | POA: Diagnosis not present

## 2014-07-16 DIAGNOSIS — Z79899 Other long term (current) drug therapy: Secondary | ICD-10-CM | POA: Diagnosis not present

## 2014-07-16 DIAGNOSIS — K219 Gastro-esophageal reflux disease without esophagitis: Secondary | ICD-10-CM | POA: Diagnosis not present

## 2014-07-16 DIAGNOSIS — G510 Bell's palsy: Secondary | ICD-10-CM | POA: Diagnosis not present

## 2014-07-16 DIAGNOSIS — T66XXXS Radiation sickness, unspecified, sequela: Secondary | ICD-10-CM | POA: Diagnosis not present

## 2014-07-16 DIAGNOSIS — E039 Hypothyroidism, unspecified: Secondary | ICD-10-CM | POA: Diagnosis not present

## 2014-07-16 DIAGNOSIS — K5900 Constipation, unspecified: Secondary | ICD-10-CM | POA: Diagnosis not present

## 2014-07-16 DIAGNOSIS — R070 Pain in throat: Secondary | ICD-10-CM | POA: Diagnosis not present

## 2014-07-16 DIAGNOSIS — Z9049 Acquired absence of other specified parts of digestive tract: Secondary | ICD-10-CM | POA: Diagnosis not present

## 2014-07-16 DIAGNOSIS — R1310 Dysphagia, unspecified: Secondary | ICD-10-CM | POA: Diagnosis not present

## 2014-07-16 DIAGNOSIS — C4441 Basal cell carcinoma of skin of scalp and neck: Secondary | ICD-10-CM | POA: Diagnosis not present

## 2014-07-16 DIAGNOSIS — Z7982 Long term (current) use of aspirin: Secondary | ICD-10-CM | POA: Diagnosis not present

## 2014-07-16 DIAGNOSIS — C4442 Squamous cell carcinoma of skin of scalp and neck: Secondary | ICD-10-CM | POA: Diagnosis not present

## 2014-07-16 DIAGNOSIS — Z85828 Personal history of other malignant neoplasm of skin: Secondary | ICD-10-CM | POA: Diagnosis not present

## 2014-07-16 DIAGNOSIS — K1233 Oral mucositis (ulcerative) due to radiation: Secondary | ICD-10-CM | POA: Diagnosis not present

## 2014-07-16 DIAGNOSIS — C7989 Secondary malignant neoplasm of other specified sites: Secondary | ICD-10-CM | POA: Diagnosis not present

## 2014-07-16 DIAGNOSIS — R634 Abnormal weight loss: Secondary | ICD-10-CM | POA: Diagnosis not present

## 2014-07-16 DIAGNOSIS — I10 Essential (primary) hypertension: Secondary | ICD-10-CM | POA: Diagnosis not present

## 2014-07-16 DIAGNOSIS — K117 Disturbances of salivary secretion: Secondary | ICD-10-CM | POA: Diagnosis not present

## 2014-07-16 DIAGNOSIS — Z510 Encounter for antineoplastic radiation therapy: Secondary | ICD-10-CM | POA: Diagnosis not present

## 2014-07-16 DIAGNOSIS — Z791 Long term (current) use of non-steroidal anti-inflammatories (NSAID): Secondary | ICD-10-CM | POA: Diagnosis not present

## 2014-07-16 DIAGNOSIS — E785 Hyperlipidemia, unspecified: Secondary | ICD-10-CM | POA: Diagnosis not present

## 2014-07-16 DIAGNOSIS — Z952 Presence of prosthetic heart valve: Secondary | ICD-10-CM | POA: Diagnosis not present

## 2014-07-16 DIAGNOSIS — L599 Disorder of the skin and subcutaneous tissue related to radiation, unspecified: Secondary | ICD-10-CM | POA: Diagnosis not present

## 2014-07-16 DIAGNOSIS — C4440 Unspecified malignant neoplasm of skin of scalp and neck: Secondary | ICD-10-CM

## 2014-07-16 DIAGNOSIS — I951 Orthostatic hypotension: Secondary | ICD-10-CM | POA: Diagnosis not present

## 2014-07-16 MED ORDER — DEXTROSE-NACL 5-0.45 % IV SOLN
INTRAVENOUS | Status: AC
  Administered 2014-07-16: 09:00:00 via INTRAVENOUS
  Filled 2014-07-16: qty 1000

## 2014-07-16 NOTE — Progress Notes (Signed)
Nutrition follow-up completed with patient during IV fluids. Patient reports mouth and tongue continue to be painful. Patient reports pain medication has helped. Patient is not eating any food at this time but is trying to drink oral nutrition supplements. Reports he has no appetite. Patient reports drinking 2-4 supplements daily. Weight decreased to 171.2 pounds February 8, down from 172.2 pounds February 1. Patient prefers to drink thin liquids using a straw. Assisted patient with consuming one bottle ensure complete during our follow-up.  Patient appeared to tolerate this well. Needs continual encouragement.  Nutrition diagnosis: Food and nutrition related knowledge deficit improved.  Intervention: Stressed importance of patient consuming goal of 6 cans Ensure Plus daily. Discussed different ways patient can improved tolerance. Encouraged patient to continue pain medication as prescribed.   Teach back method used.   Patient expresses appreciation.  Monitoring, evaluation, goals: Patient will work to increase oral intake of oral nutrition supplements to minimize weight loss.  Next visit: Friday, February 12, during IV fluids.  **Disclaimer: This note was dictated with voice recognition software. Similar sounding words can inadvertently be transcribed and this note may contain transcription errors which may not have been corrected upon publication of note.**

## 2014-07-16 NOTE — Telephone Encounter (Signed)
Called patient to remind of labs on 07-17-14 prior to his treatment, spoke with patient's wife, [name] and she is aware of this appt.

## 2014-07-16 NOTE — Patient Instructions (Signed)

## 2014-07-17 ENCOUNTER — Telehealth: Payer: Self-pay | Admitting: *Deleted

## 2014-07-17 ENCOUNTER — Other Ambulatory Visit (HOSPITAL_COMMUNITY): Payer: Self-pay | Admitting: Radiation Oncology

## 2014-07-17 ENCOUNTER — Ambulatory Visit
Admission: RE | Admit: 2014-07-17 | Discharge: 2014-07-17 | Disposition: A | Discharge status: Home / Self Care | Payer: Medicare Other | Source: Ambulatory Visit | Attending: Radiation Oncology | Admitting: Radiation Oncology

## 2014-07-17 DIAGNOSIS — I10 Essential (primary) hypertension: Secondary | ICD-10-CM | POA: Diagnosis not present

## 2014-07-17 DIAGNOSIS — R5383 Other fatigue: Secondary | ICD-10-CM | POA: Diagnosis not present

## 2014-07-17 DIAGNOSIS — I951 Orthostatic hypotension: Secondary | ICD-10-CM | POA: Diagnosis not present

## 2014-07-17 DIAGNOSIS — Z791 Long term (current) use of non-steroidal anti-inflammatories (NSAID): Secondary | ICD-10-CM | POA: Diagnosis not present

## 2014-07-17 DIAGNOSIS — Z6826 Body mass index (BMI) 26.0-26.9, adult: Secondary | ICD-10-CM | POA: Diagnosis not present

## 2014-07-17 DIAGNOSIS — E785 Hyperlipidemia, unspecified: Secondary | ICD-10-CM | POA: Diagnosis not present

## 2014-07-17 DIAGNOSIS — R634 Abnormal weight loss: Secondary | ICD-10-CM | POA: Diagnosis not present

## 2014-07-17 DIAGNOSIS — K219 Gastro-esophageal reflux disease without esophagitis: Secondary | ICD-10-CM | POA: Diagnosis not present

## 2014-07-17 DIAGNOSIS — E039 Hypothyroidism, unspecified: Secondary | ICD-10-CM | POA: Diagnosis not present

## 2014-07-17 DIAGNOSIS — Z7982 Long term (current) use of aspirin: Secondary | ICD-10-CM | POA: Diagnosis not present

## 2014-07-17 DIAGNOSIS — T66XXXS Radiation sickness, unspecified, sequela: Secondary | ICD-10-CM | POA: Diagnosis not present

## 2014-07-17 DIAGNOSIS — C7989 Secondary malignant neoplasm of other specified sites: Secondary | ICD-10-CM | POA: Diagnosis not present

## 2014-07-17 DIAGNOSIS — R070 Pain in throat: Secondary | ICD-10-CM | POA: Diagnosis not present

## 2014-07-17 DIAGNOSIS — K5900 Constipation, unspecified: Secondary | ICD-10-CM | POA: Diagnosis not present

## 2014-07-17 DIAGNOSIS — Z85828 Personal history of other malignant neoplasm of skin: Secondary | ICD-10-CM | POA: Diagnosis not present

## 2014-07-17 DIAGNOSIS — C4441 Basal cell carcinoma of skin of scalp and neck: Secondary | ICD-10-CM | POA: Diagnosis not present

## 2014-07-17 DIAGNOSIS — Z952 Presence of prosthetic heart valve: Secondary | ICD-10-CM | POA: Diagnosis not present

## 2014-07-17 DIAGNOSIS — Z9049 Acquired absence of other specified parts of digestive tract: Secondary | ICD-10-CM | POA: Diagnosis not present

## 2014-07-17 DIAGNOSIS — C4440 Unspecified malignant neoplasm of skin of scalp and neck: Secondary | ICD-10-CM

## 2014-07-17 DIAGNOSIS — R1310 Dysphagia, unspecified: Secondary | ICD-10-CM | POA: Diagnosis not present

## 2014-07-17 DIAGNOSIS — Z510 Encounter for antineoplastic radiation therapy: Secondary | ICD-10-CM | POA: Diagnosis not present

## 2014-07-17 DIAGNOSIS — Z79899 Other long term (current) drug therapy: Secondary | ICD-10-CM | POA: Diagnosis not present

## 2014-07-17 DIAGNOSIS — L599 Disorder of the skin and subcutaneous tissue related to radiation, unspecified: Secondary | ICD-10-CM | POA: Diagnosis not present

## 2014-07-17 DIAGNOSIS — K117 Disturbances of salivary secretion: Secondary | ICD-10-CM | POA: Diagnosis not present

## 2014-07-17 DIAGNOSIS — K1233 Oral mucositis (ulcerative) due to radiation: Secondary | ICD-10-CM | POA: Diagnosis not present

## 2014-07-17 DIAGNOSIS — C4442 Squamous cell carcinoma of skin of scalp and neck: Secondary | ICD-10-CM | POA: Diagnosis not present

## 2014-07-17 DIAGNOSIS — G510 Bell's palsy: Secondary | ICD-10-CM | POA: Diagnosis not present

## 2014-07-17 LAB — BASIC METABOLIC PANEL (CC13)
Anion Gap: 9 mEq/L (ref 3–11)
BUN: 18.5 mg/dL (ref 7.0–26.0)
CHLORIDE: 107 meq/L (ref 98–109)
CO2: 26 mEq/L (ref 22–29)
Calcium: 9.4 mg/dL (ref 8.4–10.4)
Creatinine: 0.9 mg/dL (ref 0.7–1.3)
EGFR: 78 mL/min/{1.73_m2} — AB (ref >90)
Glucose: 96 mg/dl (ref 70–140)
POTASSIUM: 3.8 meq/L (ref 3.5–5.1)
Sodium: 142 mEq/L (ref 136–145)

## 2014-07-17 NOTE — Telephone Encounter (Signed)
Received VM from patient.  He stated he is feeling much better with the IVF, requests that he receive additional IVF next week M, W and F and the following M, all appts at 0800.  I returned his call, spoke with his wife, indicated he left VM with RN with request for daily IVF next week.  I explained that Dr. Isidore Moos will evaluate and determine appropriate frequency.  Dr. Isidore Moos notified.  Gayleen Orem, RN, BSN, [place] at [place] [phone]

## 2014-07-17 NOTE — Telephone Encounter (Signed)
Received voice mail from patient stating the IVF "make him feel so much better." He is requesting to receive IVF at 8 am every day through the end of his radiation treatments. Spoke with his wife and informed her that Dr Isidore Moos is out of the office until tomorrow morning. Informed her that this RN cannot order IVF but will inform Dr Isidore Moos tomorrow morning of [place] request. Reminded her that he will receive IVF tomorrow at 8 am and that he will see Dr Isidore Moos next Monday as well. Ms Lebon verbalized appreciation and understanding.

## 2014-07-18 ENCOUNTER — Encounter: Payer: Self-pay | Admitting: *Deleted

## 2014-07-18 ENCOUNTER — Other Ambulatory Visit: Payer: Self-pay | Admitting: Radiation Oncology

## 2014-07-18 ENCOUNTER — Ambulatory Visit
Admission: RE | Admit: 2014-07-18 | Discharge: 2014-07-18 | Disposition: A | Discharge status: Home / Self Care | Payer: Medicare Other | Source: Ambulatory Visit | Attending: Radiation Oncology | Admitting: Radiation Oncology

## 2014-07-18 ENCOUNTER — Telehealth: Payer: Self-pay | Admitting: *Deleted

## 2014-07-18 ENCOUNTER — Ambulatory Visit: Payer: Medicare Other | Admitting: Nutrition

## 2014-07-18 ENCOUNTER — Ambulatory Visit: Payer: Medicare Other

## 2014-07-18 VITALS — BP 99/57 | HR 74 | Temp 97.0°F | Resp 20

## 2014-07-18 DIAGNOSIS — I10 Essential (primary) hypertension: Secondary | ICD-10-CM | POA: Diagnosis not present

## 2014-07-18 DIAGNOSIS — K219 Gastro-esophageal reflux disease without esophagitis: Secondary | ICD-10-CM | POA: Diagnosis not present

## 2014-07-18 DIAGNOSIS — C7989 Secondary malignant neoplasm of other specified sites: Secondary | ICD-10-CM | POA: Diagnosis not present

## 2014-07-18 DIAGNOSIS — E785 Hyperlipidemia, unspecified: Secondary | ICD-10-CM | POA: Diagnosis not present

## 2014-07-18 DIAGNOSIS — K117 Disturbances of salivary secretion: Secondary | ICD-10-CM | POA: Diagnosis not present

## 2014-07-18 DIAGNOSIS — Z9049 Acquired absence of other specified parts of digestive tract: Secondary | ICD-10-CM | POA: Diagnosis not present

## 2014-07-18 DIAGNOSIS — G510 Bell's palsy: Secondary | ICD-10-CM | POA: Diagnosis not present

## 2014-07-18 DIAGNOSIS — E860 Dehydration: Secondary | ICD-10-CM

## 2014-07-18 DIAGNOSIS — Z510 Encounter for antineoplastic radiation therapy: Secondary | ICD-10-CM | POA: Diagnosis not present

## 2014-07-18 DIAGNOSIS — K1233 Oral mucositis (ulcerative) due to radiation: Secondary | ICD-10-CM | POA: Diagnosis not present

## 2014-07-18 DIAGNOSIS — R1310 Dysphagia, unspecified: Secondary | ICD-10-CM | POA: Diagnosis not present

## 2014-07-18 DIAGNOSIS — Z6826 Body mass index (BMI) 26.0-26.9, adult: Secondary | ICD-10-CM | POA: Diagnosis not present

## 2014-07-18 DIAGNOSIS — R070 Pain in throat: Secondary | ICD-10-CM | POA: Diagnosis not present

## 2014-07-18 DIAGNOSIS — Z952 Presence of prosthetic heart valve: Secondary | ICD-10-CM | POA: Diagnosis not present

## 2014-07-18 DIAGNOSIS — E039 Hypothyroidism, unspecified: Secondary | ICD-10-CM | POA: Diagnosis not present

## 2014-07-18 DIAGNOSIS — Z791 Long term (current) use of non-steroidal anti-inflammatories (NSAID): Secondary | ICD-10-CM | POA: Diagnosis not present

## 2014-07-18 DIAGNOSIS — C4440 Unspecified malignant neoplasm of skin of scalp and neck: Secondary | ICD-10-CM

## 2014-07-18 DIAGNOSIS — Z7982 Long term (current) use of aspirin: Secondary | ICD-10-CM | POA: Diagnosis not present

## 2014-07-18 DIAGNOSIS — R634 Abnormal weight loss: Secondary | ICD-10-CM | POA: Diagnosis not present

## 2014-07-18 DIAGNOSIS — Z85828 Personal history of other malignant neoplasm of skin: Secondary | ICD-10-CM | POA: Diagnosis not present

## 2014-07-18 DIAGNOSIS — C4441 Basal cell carcinoma of skin of scalp and neck: Secondary | ICD-10-CM | POA: Diagnosis not present

## 2014-07-18 DIAGNOSIS — K5900 Constipation, unspecified: Secondary | ICD-10-CM | POA: Diagnosis not present

## 2014-07-18 DIAGNOSIS — T66XXXS Radiation sickness, unspecified, sequela: Secondary | ICD-10-CM | POA: Diagnosis not present

## 2014-07-18 DIAGNOSIS — R5383 Other fatigue: Secondary | ICD-10-CM | POA: Diagnosis not present

## 2014-07-18 DIAGNOSIS — L599 Disorder of the skin and subcutaneous tissue related to radiation, unspecified: Secondary | ICD-10-CM | POA: Diagnosis not present

## 2014-07-18 DIAGNOSIS — I951 Orthostatic hypotension: Secondary | ICD-10-CM | POA: Diagnosis not present

## 2014-07-18 DIAGNOSIS — Z79899 Other long term (current) drug therapy: Secondary | ICD-10-CM | POA: Diagnosis not present

## 2014-07-18 DIAGNOSIS — C4442 Squamous cell carcinoma of skin of scalp and neck: Secondary | ICD-10-CM | POA: Diagnosis not present

## 2014-07-18 MED ORDER — POTASSIUM CHLORIDE 2 MEQ/ML IV SOLN
Freq: Once | INTRAVENOUS | Status: AC
  Administered 2014-07-18: 08:00:00 via INTRAVENOUS
  Filled 2014-07-18: qty 1000

## 2014-07-18 NOTE — Progress Notes (Signed)
Nutrition follow-up completed with patient. Patient continues to struggle with oral intake and appears to consume only one to 2 oral nutrition supplements daily. Patient requires constant encouragement.   Patient able to consume ensure complete while getting IV fluids today. Enforced the importance of patient continuing oral nutrition supplements at home. Will continue to work with patient.

## 2014-07-18 NOTE — Progress Notes (Signed)
Per Dr Isidore Moos, pt to receive IVF today of D 5 1/2 w/20 mEq Kcl. Dorian Pod RN, infusion notified.

## 2014-07-18 NOTE — Progress Notes (Signed)
Spoke with patient after RT, informed him that I would call him to let him know about IVF schedule for next week.  He verbalized understanding.  Gayleen Orem, RN, BSN, [place] at [employer] [phone]

## 2014-07-18 NOTE — Patient Instructions (Signed)
Dehydration, Adult Dehydration is when you lose more fluids from the body than you take in. Vital organs like the kidneys, brain, and heart cannot function without a proper amount of fluids and salt. Any loss of fluids from the body can cause dehydration.  CAUSES   Vomiting.  Diarrhea.  Excessive sweating.  Excessive urine output.  Fever. SYMPTOMS  Mild dehydration  Thirst.  Dry lips.  Slightly dry mouth. Moderate dehydration  Very dry mouth.  Sunken eyes.  Skin does not bounce back quickly when lightly pinched and released.  Dark urine and decreased urine production.  Decreased tear production.  Headache. Severe dehydration  Very dry mouth.  Extreme thirst.  Rapid, weak pulse (more than 100 beats per minute at rest).  Cold hands and feet.  Not able to sweat in spite of heat and temperature.  Rapid breathing.  Blue lips.  Confusion and lethargy.  Difficulty being awakened.  Minimal urine production.  No tears. DIAGNOSIS  Your caregiver will diagnose dehydration based on your symptoms and your exam. Blood and urine tests will help confirm the diagnosis. The diagnostic evaluation should also identify the cause of dehydration. TREATMENT  Treatment of mild or moderate dehydration can often be done at home by increasing the amount of fluids that you drink. It is best to drink small amounts of fluid more often. Drinking too much at one time can make vomiting worse. Refer to the home care instructions below. Severe dehydration needs to be treated at the hospital where you will probably be given intravenous (IV) fluids that contain water and electrolytes. HOME CARE INSTRUCTIONS   Ask your caregiver about specific rehydration instructions.  Drink enough fluids to keep your urine clear or pale yellow.  Drink small amounts frequently if you have nausea and vomiting.  Eat as you normally do.  Avoid:  Foods or drinks high in sugar.  Carbonated  drinks.  Juice.  Extremely hot or cold fluids.  Drinks with caffeine.  Fatty, greasy foods.  Alcohol.  Tobacco.  Overeating.  Gelatin desserts.  Wash your hands well to avoid spreading bacteria and viruses.  Only take over-the-counter or prescription medicines for pain, discomfort, or fever as directed by your caregiver.  Ask your caregiver if you should continue all prescribed and over-the-counter medicines.  Keep all follow-up appointments with your caregiver. SEEK MEDICAL CARE IF:  You have abdominal pain and it increases or stays in one area (localizes).  You have a rash, stiff neck, or severe headache.  You are irritable, sleepy, or difficult to awaken.  You are weak, dizzy, or extremely thirsty. SEEK IMMEDIATE MEDICAL CARE IF:   You are unable to keep fluids down or you get worse despite treatment.  You have frequent episodes of vomiting or diarrhea.  You have blood or green matter (bile) in your vomit.  You have blood in your stool or your stool looks black and tarry.  You have not urinated in 6 to 8 hours, or you have only urinated a small amount of very dark urine.  You have a fever.  You faint. MAKE SURE YOU:   Understand these instructions.  Will watch your condition.  Will get help right away if you are not doing well or get worse. Document Released: 05/23/2005 Document Revised: 08/15/2011 Document Reviewed: 01/10/2011 ExitCare Patient Information 2015 [employer]. This information is not intended to replace advice given to you by your health care provider. Make sure you discuss any questions you have with your health care   provider.  

## 2014-07-18 NOTE — Telephone Encounter (Signed)
LVM informing IVF scheduled for next week M, W and F @ 0800 and the following M @ 0800.  Gayleen Orem, RN, BSN, [place] at [place] [phone]

## 2014-07-18 NOTE — Telephone Encounter (Signed)
Spoke with patient wife.  Informed her of husband's IVF schedule of next M, W and F @ 0800 and the following M @ 0800.  She verbalized understanding.  Gayleen Orem, RN, BSN, [place] at [place] [phone]

## 2014-07-21 ENCOUNTER — Telehealth: Payer: Self-pay | Admitting: *Deleted

## 2014-07-21 ENCOUNTER — Ambulatory Visit
Admission: RE | Admit: 2014-07-21 | Discharge: 2014-07-21 | Disposition: A | Discharge status: Home / Self Care | Payer: Medicare Other | Source: Ambulatory Visit | Attending: Radiation Oncology | Admitting: Radiation Oncology

## 2014-07-21 ENCOUNTER — Other Ambulatory Visit: Payer: Self-pay | Admitting: Radiation Oncology

## 2014-07-21 ENCOUNTER — Ambulatory Visit: Payer: Medicare Other

## 2014-07-21 ENCOUNTER — Encounter: Payer: Self-pay | Admitting: *Deleted

## 2014-07-21 VITALS — BP 76/35 | HR 67 | Temp 97.9°F | Resp 20

## 2014-07-21 VITALS — BP 106/76 | HR 66 | Temp 98.1°F | Resp 22 | Wt 169.6 lb

## 2014-07-21 DIAGNOSIS — Z510 Encounter for antineoplastic radiation therapy: Secondary | ICD-10-CM | POA: Diagnosis not present

## 2014-07-21 DIAGNOSIS — Z79899 Other long term (current) drug therapy: Secondary | ICD-10-CM | POA: Diagnosis not present

## 2014-07-21 DIAGNOSIS — G510 Bell's palsy: Secondary | ICD-10-CM | POA: Insufficient documentation

## 2014-07-21 DIAGNOSIS — R1310 Dysphagia, unspecified: Secondary | ICD-10-CM | POA: Diagnosis not present

## 2014-07-21 DIAGNOSIS — L598 Other specified disorders of the skin and subcutaneous tissue related to radiation: Secondary | ICD-10-CM | POA: Insufficient documentation

## 2014-07-21 DIAGNOSIS — R070 Pain in throat: Secondary | ICD-10-CM | POA: Diagnosis not present

## 2014-07-21 DIAGNOSIS — E039 Hypothyroidism, unspecified: Secondary | ICD-10-CM | POA: Diagnosis not present

## 2014-07-21 DIAGNOSIS — L539 Erythematous condition, unspecified: Secondary | ICD-10-CM | POA: Insufficient documentation

## 2014-07-21 DIAGNOSIS — I951 Orthostatic hypotension: Secondary | ICD-10-CM | POA: Diagnosis not present

## 2014-07-21 DIAGNOSIS — C4440 Unspecified malignant neoplasm of skin of scalp and neck: Secondary | ICD-10-CM

## 2014-07-21 DIAGNOSIS — R5383 Other fatigue: Secondary | ICD-10-CM | POA: Diagnosis not present

## 2014-07-21 DIAGNOSIS — T66XXXS Radiation sickness, unspecified, sequela: Secondary | ICD-10-CM | POA: Diagnosis not present

## 2014-07-21 DIAGNOSIS — E785 Hyperlipidemia, unspecified: Secondary | ICD-10-CM | POA: Diagnosis not present

## 2014-07-21 DIAGNOSIS — Z791 Long term (current) use of non-steroidal anti-inflammatories (NSAID): Secondary | ICD-10-CM | POA: Diagnosis not present

## 2014-07-21 DIAGNOSIS — R634 Abnormal weight loss: Secondary | ICD-10-CM | POA: Diagnosis not present

## 2014-07-21 DIAGNOSIS — L599 Disorder of the skin and subcutaneous tissue related to radiation, unspecified: Secondary | ICD-10-CM | POA: Diagnosis not present

## 2014-07-21 DIAGNOSIS — K219 Gastro-esophageal reflux disease without esophagitis: Secondary | ICD-10-CM | POA: Diagnosis not present

## 2014-07-21 DIAGNOSIS — C4442 Squamous cell carcinoma of skin of scalp and neck: Secondary | ICD-10-CM | POA: Diagnosis not present

## 2014-07-21 DIAGNOSIS — K1233 Oral mucositis (ulcerative) due to radiation: Secondary | ICD-10-CM | POA: Diagnosis not present

## 2014-07-21 DIAGNOSIS — I10 Essential (primary) hypertension: Secondary | ICD-10-CM | POA: Diagnosis not present

## 2014-07-21 DIAGNOSIS — K117 Disturbances of salivary secretion: Secondary | ICD-10-CM | POA: Diagnosis not present

## 2014-07-21 DIAGNOSIS — C4441 Basal cell carcinoma of skin of scalp and neck: Secondary | ICD-10-CM | POA: Diagnosis not present

## 2014-07-21 DIAGNOSIS — C7989 Secondary malignant neoplasm of other specified sites: Secondary | ICD-10-CM | POA: Diagnosis not present

## 2014-07-21 DIAGNOSIS — Z952 Presence of prosthetic heart valve: Secondary | ICD-10-CM | POA: Diagnosis not present

## 2014-07-21 DIAGNOSIS — K5900 Constipation, unspecified: Secondary | ICD-10-CM | POA: Diagnosis not present

## 2014-07-21 DIAGNOSIS — Z9049 Acquired absence of other specified parts of digestive tract: Secondary | ICD-10-CM | POA: Diagnosis not present

## 2014-07-21 DIAGNOSIS — Z85828 Personal history of other malignant neoplasm of skin: Secondary | ICD-10-CM | POA: Diagnosis not present

## 2014-07-21 DIAGNOSIS — Z7982 Long term (current) use of aspirin: Secondary | ICD-10-CM | POA: Diagnosis not present

## 2014-07-21 DIAGNOSIS — Z6826 Body mass index (BMI) 26.0-26.9, adult: Secondary | ICD-10-CM | POA: Diagnosis not present

## 2014-07-21 MED ORDER — SILVER SULFADIAZINE 1 % EX CREA
TOPICAL_CREAM | Freq: Two times a day (BID) | CUTANEOUS | Status: DC
  Administered 2014-07-21: 13:00:00 via TOPICAL

## 2014-07-21 MED ORDER — POTASSIUM CHLORIDE 2 MEQ/ML IV SOLN
INTRAVENOUS | Status: AC
  Administered 2014-07-21: 09:00:00 via INTRAVENOUS
  Filled 2014-07-21: qty 1000

## 2014-07-21 MED ORDER — FENTANYL 37.5 MCG/HR TD PT72: 37.5000 ug | MEDICATED_PATCH | TRANSDERMAL | Status: DC

## 2014-07-21 MED ORDER — HYDROCODONE-ACETAMINOPHEN 7.5-325 MG PO TABS: 1.0000 | ORAL_TABLET | ORAL | Status: DC | PRN

## 2014-07-21 NOTE — Patient Instructions (Addendum)
Entry error

## 2014-07-21 NOTE — Patient Instructions (Signed)
Dehydration, Adult Dehydration is when you lose more fluids from the body than you take in. Vital organs like the kidneys, brain, and heart cannot function without a proper amount of fluids and salt. Any loss of fluids from the body can cause dehydration.  CAUSES   Vomiting.  Diarrhea.  Excessive sweating.  Excessive urine output.  Fever. SYMPTOMS  Mild dehydration  Thirst.  Dry lips.  Slightly dry mouth. Moderate dehydration  Very dry mouth.  Sunken eyes.  Skin does not bounce back quickly when lightly pinched and released.  Dark urine and decreased urine production.  Decreased tear production.  Headache. Severe dehydration  Very dry mouth.  Extreme thirst.  Rapid, weak pulse (more than 100 beats per minute at rest).  Cold hands and feet.  Not able to sweat in spite of heat and temperature.  Rapid breathing.  Blue lips.  Confusion and lethargy.  Difficulty being awakened.  Minimal urine production.  No tears. DIAGNOSIS  Your caregiver will diagnose dehydration based on your symptoms and your exam. Blood and urine tests will help confirm the diagnosis. The diagnostic evaluation should also identify the cause of dehydration. TREATMENT  Treatment of mild or moderate dehydration can often be done at home by increasing the amount of fluids that you drink. It is best to drink small amounts of fluid more often. Drinking too much at one time can make vomiting worse. Refer to the home care instructions below. Severe dehydration needs to be treated at the hospital where you will probably be given intravenous (IV) fluids that contain water and electrolytes. HOME CARE INSTRUCTIONS   Ask your caregiver about specific rehydration instructions.  Drink enough fluids to keep your urine clear or pale yellow.  Drink small amounts frequently if you have nausea and vomiting.  Eat as you normally do.  Avoid:  Foods or drinks high in sugar.  Carbonated  drinks.  Juice.  Extremely hot or cold fluids.  Drinks with caffeine.  Fatty, greasy foods.  Alcohol.  Tobacco.  Overeating.  Gelatin desserts.  Wash your hands well to avoid spreading bacteria and viruses.  Only take over-the-counter or prescription medicines for pain, discomfort, or fever as directed by your caregiver.  Ask your caregiver if you should continue all prescribed and over-the-counter medicines.  Keep all follow-up appointments with your caregiver. SEEK MEDICAL CARE IF:  You have abdominal pain and it increases or stays in one area (localizes).  You have a rash, stiff neck, or severe headache.  You are irritable, sleepy, or difficult to awaken.  You are weak, dizzy, or extremely thirsty. SEEK IMMEDIATE MEDICAL CARE IF:   You are unable to keep fluids down or you get worse despite treatment.  You have frequent episodes of vomiting or diarrhea.  You have blood or green matter (bile) in your vomit.  You have blood in your stool or your stool looks black and tarry.  You have not urinated in 6 to 8 hours, or you have only urinated a small amount of very dark urine.  You have a fever.  You faint. MAKE SURE YOU:   Understand these instructions.  Will watch your condition.  Will get help right away if you are not doing well or get worse. Document Released: 05/23/2005 Document Revised: 08/15/2011 Document Reviewed: 01/10/2011 ExitCare Patient Information 2015 [employer]. This information is not intended to replace advice given to you by your health care provider. Make sure you discuss any questions you have with your health care   provider.  

## 2014-07-21 NOTE — Telephone Encounter (Signed)
CALLED PATIENT TO INFORM OF LABS ON 07-28-14 @ 8 AM AND HIS PEG TUBE PLACEMENT ON 07-29-14- ARRIVAL TIME - 11:30 AM @ [facility] AND HIS APPT. WITH DR. Elvera Lennox ON 08-26-14 @ 10:40 AM, SPOKE WITH PATIENT'S WIFE [name] AND SHE IS AWARE OF THESE APPTS.

## 2014-07-21 NOTE — Patient Instructions (Signed)
Based on patient's Weekly Under Treatment appt with Dr. Isidore Moos on 07/21/14, I provided him the following instructions and information:  1. Apply Silvadene ointment twice daily to oozing site on back of right ear.  Apply thin layer, dab clean with warm moist washcloth before second application.  2. Apply artificial tears to right eye as needed.    3. Continue current regime of medications for pain control.  4. Contact Rick tomorrow, Tuesday, 07/22/14, regarding your decision about a feeding tube.  5. IV fluids will be schedule daily the rest of this week and on Monday of next week.  6. Dr. Valere Dross will see you next Monday after your final radiation treatment.  He will evaluate you for further daily fluids.  7. Make the following appointments:  Opthalmologist - After radiation treatment is completed next week, schedule appointment with opthalmologist to address concern about right eye.  Wait until radiation treatments are completed next week.  Dermatologist - Isidore Moos is placing a referral for you to see Dr. Jarome Matin with [facility].  Schedule an appointment in 6-8 weeks to address concerns about recurring scalp/face growths.  Audiologist - Make appointment to have hearing aids checked/adjusted.   Gayleen Orem, RN, BSN, [place] at [place] [phone]

## 2014-07-21 NOTE — Progress Notes (Signed)
Weekly Management Note:  Outpatient    ICD-9-CM ICD-10-CM   1. Skin cancer of scalp or skin of neck 173.41 C44.41 HYDROcodone-acetaminophen (NORCO) 7.5-325 MG per tablet     FentaNYL 37.5 MCG/HR PT72     silver sulfADIAZINE (SILVADENE) 1 % cream     silver sulfADIAZINE (SILVADENE) 1 % cream     Albumin     Prealbumin     Basic metabolic panel     IR GASTROSTOMY TUBE MOD SED     dextrose 5 % and 0.45% NaCl 1,000 mL with potassium chloride 20 mEq infusion     dextrose 5 % and 0.45% NaCl 1,000 mL with potassium chloride 20 mEq infusion     Ambulatory referral to Dermatology     CANCELED: Ambulatory referral to Dermatology    Current Dose:  56 Gy  Projected Dose: 66 Gy   Narrative:  The patient presents for routine under treatment assessment.  CBCT/MVCT images/Port film x-rays were reviewed.  The chart was checked.Patient receiving IVF today, wife at his side. Patient reports pain in his mouth he describes as "stinging", but he states "it is tolerable".  He states he is wearing Duragesic patch, needs refill and is taking Hydrocodone 1 tab every four hours. He is using Biotene for dry mouth and thick saliva with little relief. Suggested he try water/salt/baking soda. He is applying Biafine to neck for erythema, applying antibiotic ointment to  moist desquamation. Per his wife he is drinking VERY LITTLE.  He is fatigued. Patient and wife want to discuss peg tube   Physical Findings:  Wt Readings from Last 3 Encounters:  07/21/14 169 lb 9.6 oz (76.93 kg)  07/14/14 171 lb 3.2 oz (77.656 kg)  07/09/14 175 lb 6.4 oz (79.561 kg)    weight is 169 lb 9.6 oz (76.93 kg). His oral temperature is 98.1 F (36.7 C). His blood pressure is 106/76 and his pulse is 66. His respiration is 22.  Dry oral mucosa with patchy mucositis.  Crusting desquamation over right face and neck. No LE edema. Lungs CTAB.  Moist desquamation behind right ear. Hard of hearing.  Right eye does not appear infected.  CBC      Component Value Date/Time   WBC 4.2 06/24/2014 1019   WBC 7.2 03/31/2014 1140   RBC 4.83 06/24/2014 1019   RBC 4.79 03/31/2014 1140   HGB 13.6 06/24/2014 1019   HGB 15.0 03/31/2014 1140   HCT 42.9 06/24/2014 1019   HCT 44.9 03/31/2014 1140   PLT 231 06/24/2014 1019   PLT 326 03/31/2014 1140   MCV 88.8 06/24/2014 1019   MCV 93.7 03/31/2014 1140   MCH 28.2 06/24/2014 1019   MCH 31.3 03/31/2014 1140   MCHC 31.7* 06/24/2014 1019   MCHC 33.4 03/31/2014 1140   RDW 13.5 06/24/2014 1019   RDW 13.0 03/31/2014 1140   LYMPHSABS 0.6* 06/24/2014 1019   LYMPHSABS 0.8 03/31/2014 1140   MONOABS 0.4 06/24/2014 1019   MONOABS 0.3 03/31/2014 1140   EOSABS 0.2 06/24/2014 1019   EOSABS 0.1 03/31/2014 1140   BASOSABS 0.0 06/24/2014 1019   BASOSABS 0.0 03/31/2014 1140     CMP     Component Value Date/Time   NA 142 07/17/2014 0906   NA 139 03/03/2014 1020   K 3.8 07/17/2014 0906   K 4.5 03/03/2014 1020   CL 103 03/03/2014 1020   CO2 26 07/17/2014 0906   CO2 27 03/03/2014 1020   GLUCOSE 96 07/17/2014 0906  GLUCOSE 98 03/03/2014 1020   BUN 18.5 07/17/2014 0906   BUN 14 03/03/2014 1020   CREATININE 0.9 07/17/2014 0906   CREATININE 0.90 03/03/2014 1020   CREATININE 1.20 10/12/2010 1257   CALCIUM 9.4 07/17/2014 0906   CALCIUM 8.7 03/03/2014 1020   PROT 5.6* 10/22/2013 1218   ALBUMIN 3.0* 10/22/2013 1218   AST 21 10/22/2013 1218   ALT 23 10/22/2013 1218   ALKPHOS 60 10/22/2013 1218   BILITOT 0.5 10/22/2013 1218   GFRNONAA 76* 03/03/2014 1020   GFRAA 88* 03/03/2014 1020     Impression:  The patient is tolerating radiotherapy with difficulty.   Plan:  Continue radiotherapy as planned.   Met with patient and Wadie Lessen of palliative care today. Plan as discussed in detail: 1) refer to Jarome Matin per patient request, re: new skin lesions over face, scalp 2) Refer for PEG tube for nutrition, hydration; they are concerned because he is eating/drinking very little now.  SLP has been  consulted. 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 It doesn't appear infected 4) Pt instructed not to drive for at least a month 5) IVF M-F this week 6) Labs on Monday AM prior to IVF 6) IVF Monday and see Dr Valere Dross to review labs and determine future IVF orders 7) Silvadene for moist desquamation  8) I requested scheduling to book a follow up with me on 3-1 -----------------------------------  [name], MD

## 2014-07-21 NOTE — Addendum Note (Signed)
Encounter addended by: Brooks Sailors, RN on: 07/21/2014  1:45 PM<BR>     Documentation filed: Patient Instructions Section

## 2014-07-21 NOTE — Addendum Note (Signed)
Encounter addended by: Jenene Slicker, RN on: 07/21/2014  1:42 PM<BR>     Documentation filed: Patient Instructions Section

## 2014-07-21 NOTE — Progress Notes (Signed)
Patient receiving IVF today, wife at his side. Patient reports pain in his mouth he describes as "stinging", but he states "it is tolerable".  He states he is wearing Duragesic patch, needs refill and is taking Hydrocodone 1 tab every four hours. He is using Biotene for dry mouth and thick saliva with little relief. Suggested he try water/salt/baking soda. He is applying Biafine to neck for erythema, applying antibiotic ointment to small area moist desquamation under chin on right side. Per his wife he is drinking Boost juices, 2 cans daily and drinking 1 Ensure, not really sipping water. No signs of thrush noted. He is fatigued. Patient and wife want to discuss peg tube with Dr Isidore Moos.

## 2014-07-21 NOTE — Telephone Encounter (Signed)
Per Dr Isidore Moos, patient to receive IVF this Tues and Thurs; this is in addition to this Wed and Fri. Per Sharyn Lull, med onc infusion scheduler, pt can receive IVF Tues at 7:30 am and Thurs at 8 am. Orders are in, signed and held. Spoke with Ms Queenan, and she states she will have pt here for IVF both those mornings  at scheduled times.

## 2014-07-22 ENCOUNTER — Ambulatory Visit
Admission: RE | Admit: 2014-07-22 | Discharge: 2014-07-22 | Disposition: A | Discharge status: Home / Self Care | Payer: Medicare Other | Source: Ambulatory Visit | Attending: Radiation Oncology | Admitting: Radiation Oncology

## 2014-07-22 ENCOUNTER — Ambulatory Visit: Payer: Medicare Other

## 2014-07-22 ENCOUNTER — Encounter: Payer: Self-pay | Admitting: Radiation Oncology

## 2014-07-22 ENCOUNTER — Other Ambulatory Visit: Payer: Self-pay | Admitting: Hematology and Oncology

## 2014-07-22 ENCOUNTER — Ambulatory Visit (HOSPITAL_COMMUNITY)
Admission: RE | Admit: 2014-07-22 | Discharge: 2014-07-22 | Disposition: A | Discharge status: Home / Self Care | Payer: Medicare Other | Source: Ambulatory Visit | Attending: Radiation Oncology | Admitting: Radiation Oncology

## 2014-07-22 ENCOUNTER — Encounter: Payer: Self-pay | Admitting: *Deleted

## 2014-07-22 VITALS — BP 108/65 | HR 78 | Temp 98.9°F | Resp 20

## 2014-07-22 DIAGNOSIS — Z510 Encounter for antineoplastic radiation therapy: Secondary | ICD-10-CM | POA: Diagnosis not present

## 2014-07-22 DIAGNOSIS — E039 Hypothyroidism, unspecified: Secondary | ICD-10-CM | POA: Diagnosis not present

## 2014-07-22 DIAGNOSIS — T66XXXS Radiation sickness, unspecified, sequela: Secondary | ICD-10-CM | POA: Diagnosis not present

## 2014-07-22 DIAGNOSIS — I10 Essential (primary) hypertension: Secondary | ICD-10-CM | POA: Insufficient documentation

## 2014-07-22 DIAGNOSIS — L599 Disorder of the skin and subcutaneous tissue related to radiation, unspecified: Secondary | ICD-10-CM | POA: Diagnosis not present

## 2014-07-22 DIAGNOSIS — E079 Disorder of thyroid, unspecified: Secondary | ICD-10-CM | POA: Diagnosis not present

## 2014-07-22 DIAGNOSIS — K219 Gastro-esophageal reflux disease without esophagitis: Secondary | ICD-10-CM | POA: Insufficient documentation

## 2014-07-22 DIAGNOSIS — Z9049 Acquired absence of other specified parts of digestive tract: Secondary | ICD-10-CM | POA: Diagnosis not present

## 2014-07-22 DIAGNOSIS — E860 Dehydration: Secondary | ICD-10-CM

## 2014-07-22 DIAGNOSIS — R531 Weakness: Secondary | ICD-10-CM | POA: Insufficient documentation

## 2014-07-22 DIAGNOSIS — Z7982 Long term (current) use of aspirin: Secondary | ICD-10-CM | POA: Diagnosis not present

## 2014-07-22 DIAGNOSIS — R5383 Other fatigue: Secondary | ICD-10-CM | POA: Diagnosis not present

## 2014-07-22 DIAGNOSIS — Z6826 Body mass index (BMI) 26.0-26.9, adult: Secondary | ICD-10-CM | POA: Diagnosis not present

## 2014-07-22 DIAGNOSIS — Z85828 Personal history of other malignant neoplasm of skin: Secondary | ICD-10-CM | POA: Diagnosis not present

## 2014-07-22 DIAGNOSIS — K449 Diaphragmatic hernia without obstruction or gangrene: Secondary | ICD-10-CM | POA: Insufficient documentation

## 2014-07-22 DIAGNOSIS — Z8673 Personal history of transient ischemic attack (TIA), and cerebral infarction without residual deficits: Secondary | ICD-10-CM | POA: Diagnosis not present

## 2014-07-22 DIAGNOSIS — R634 Abnormal weight loss: Secondary | ICD-10-CM | POA: Diagnosis not present

## 2014-07-22 DIAGNOSIS — R1312 Dysphagia, oropharyngeal phase: Secondary | ICD-10-CM | POA: Insufficient documentation

## 2014-07-22 DIAGNOSIS — G510 Bell's palsy: Secondary | ICD-10-CM | POA: Diagnosis not present

## 2014-07-22 DIAGNOSIS — Z952 Presence of prosthetic heart valve: Secondary | ICD-10-CM | POA: Diagnosis not present

## 2014-07-22 DIAGNOSIS — Z7189 Other specified counseling: Secondary | ICD-10-CM | POA: Insufficient documentation

## 2014-07-22 DIAGNOSIS — K117 Disturbances of salivary secretion: Secondary | ICD-10-CM | POA: Diagnosis not present

## 2014-07-22 DIAGNOSIS — C4442 Squamous cell carcinoma of skin of scalp and neck: Secondary | ICD-10-CM | POA: Diagnosis not present

## 2014-07-22 DIAGNOSIS — C4441 Basal cell carcinoma of skin of scalp and neck: Secondary | ICD-10-CM | POA: Insufficient documentation

## 2014-07-22 DIAGNOSIS — R1310 Dysphagia, unspecified: Secondary | ICD-10-CM | POA: Diagnosis not present

## 2014-07-22 DIAGNOSIS — E785 Hyperlipidemia, unspecified: Secondary | ICD-10-CM | POA: Insufficient documentation

## 2014-07-22 DIAGNOSIS — C4440 Unspecified malignant neoplasm of skin of scalp and neck: Secondary | ICD-10-CM

## 2014-07-22 DIAGNOSIS — Z79899 Other long term (current) drug therapy: Secondary | ICD-10-CM | POA: Diagnosis not present

## 2014-07-22 DIAGNOSIS — D472 Monoclonal gammopathy: Secondary | ICD-10-CM | POA: Insufficient documentation

## 2014-07-22 DIAGNOSIS — K1233 Oral mucositis (ulcerative) due to radiation: Secondary | ICD-10-CM | POA: Diagnosis not present

## 2014-07-22 DIAGNOSIS — I951 Orthostatic hypotension: Secondary | ICD-10-CM | POA: Diagnosis not present

## 2014-07-22 DIAGNOSIS — C760 Malignant neoplasm of head, face and neck: Secondary | ICD-10-CM | POA: Diagnosis not present

## 2014-07-22 DIAGNOSIS — R070 Pain in throat: Secondary | ICD-10-CM | POA: Diagnosis not present

## 2014-07-22 DIAGNOSIS — Z791 Long term (current) use of non-steroidal anti-inflammatories (NSAID): Secondary | ICD-10-CM | POA: Diagnosis not present

## 2014-07-22 DIAGNOSIS — Z515 Encounter for palliative care: Secondary | ICD-10-CM | POA: Insufficient documentation

## 2014-07-22 DIAGNOSIS — K5900 Constipation, unspecified: Secondary | ICD-10-CM | POA: Diagnosis not present

## 2014-07-22 DIAGNOSIS — C7989 Secondary malignant neoplasm of other specified sites: Secondary | ICD-10-CM | POA: Diagnosis not present

## 2014-07-22 HISTORY — DX: Other specified counseling: Z71.89

## 2014-07-22 MED ORDER — POTASSIUM CHLORIDE 2 MEQ/ML IV SOLN
INTRAVENOUS | Status: DC
  Administered 2014-07-22: 08:00:00 via INTRAVENOUS
  Filled 2014-07-22: qty 1000

## 2014-07-22 NOTE — Progress Notes (Signed)
Met with patient in Infusion while he was receiving IVF.  His wife was with him. 1.  I provided him a current Epic appt calendar with appts scheduled since his appt with Dr. Squire on Monday.  I reviewed/discussed appts with them. 2.  I provided his the following summary of his appt with Dr. Squire:  Notes from appointment with Dr. Squire on Monday, 07/21/14 1. DO NOT DRIVE for a least the next month, i.e. until radiation treatment is completed and hydration status is under control.  Dr. Squire will advise. 2. Apply a thin coating of Silvadene to back of ear (and any other areas of moist skin breakdown) twice daily.  Clean area between applications with warm, moist washcloth (dab lightly). 3. Continue current regime of medication for pain control (Fentanyl patch for baseline pain control, lidocaine rinse before meals to curb throat soreness, HYDROcodone-acetaminophen (NORCO) for break-through pain) 4. Apply Artificial Tears to eye as needed.  DO NOT USE SALINE DROPS. 5. IV fluids: . You will be scheduled for IV fluids every day this week and through next Monday. . You will have labs next Monday morning prior to IV fluids and see Dr. Murray to review lab results and determine future IV fluids. 6. Let Rick know your decision about having a feeding tube placed by Tuesday. 7. Make the following appointments: . Ophthalmologist - After radiation treatment is completed, make appointment to address concern about eye irritation. . Dermatologist - Dr. Squire is referring you to Dr. Drew Jones, [facility], for evaluation of new skin lesions on scalp and face. . Audiologist - Make appointment to have hearing aids checked for adjustment.  3.  We discussed further the benefits of a PEG, I provided description of how to use.  He remains undecided and asks that we discuss again on Monday.  Rick , RN, BSN, CHPN Head & Neck Oncology Navigator [facility] at [place]  [place] [phone]   

## 2014-07-22 NOTE — Procedures (Signed)
Objective Swallowing Evaluation: Modified Barium Swallowing Study  Patient Details  Name: Adrian West MRN: [id number] Date of Birth: 05/14/1928  Today's Date: 07/22/2014 Time: SLP Start Time (ACUTE ONLY): 1041-SLP Stop Time (ACUTE ONLY): 1120 SLP Time Calculation (min) (ACUTE ONLY): 39 min  Past Medical History:  Past Medical History  Diagnosis Date  . Thyroid disease   . Hypertension   . GERD (gastroesophageal reflux disease)     PMH of stricture , recurrent;[name]  . Benign neoplasm of colon   . Tremor, essential   . Diverticulosis   . TIA (transient ischemic attack) May 2012  . Skin cancer   . Neuropathy, peripheral   . Lung abnormality 11/25/2008    "nerve damage from heart surgery"  . Hyperlipidemia   . MGUS (monoclonal gammopathy of unknown significance) 04/11/2013  . Diaphragm paralysis   . Esophageal stricture   . Hiatal hernia   . Bell's palsy   . Vertigo 2005  . Gait disturbance    Past Surgical History:  Past Surgical History  Procedure Laterality Date  . Appendectomy    . Cataract extraction      [name]  . Cholecystectomy  1998    [name]  . Inguinal hernia repair    . Lumbar laminectomy    . Nasal sinus surgery    . Tonsillectomy    . Esophageal dilation       X3;[name]  . 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 Laparoscopic ablation renal mass  08/14/2009  . Mohs surgery  2012     ear  . Tilt table study N/A 05/06/2013    Procedure: TILT TABLE STUDY;  Surgeon: [name], MD;  Location: [facility] CATH LAB;  Service: Cardiovascular;  Laterality: N/A;  . Right heart catheterization N/A 03/03/2014    Procedure: RIGHT HEART CATH;  Surgeon: [name], MD;  Location: [facility] CATH LAB;  Service: Cardiovascular;  Laterality: N/A;   HPI:  HPI: Pt is an 81 yo male referred by Dr Isidore Moos for [facility].  Pt with PMH + for skin cancer of scalp/skin undergoing radiation tx.  Pt reports  currently being on tx 28 of 34.  He hopes for a cancer cure per his statement today.  Pt reported dysphagia to MD per notes including xerostomia, viscous secretions without benefit from Biotene use.  Per chart review, his intake includes Boost juices (2 cans), Ensure (1 can) and no water intake.  CT neck 03/10/14 showed 16 mm soft tissue mass posterior to mandibular condyle - worrisome for recurrent carcinoma.  Pt reports having extensive neck/facial surgeries at [facility].  Pt is scheduled for PEG tube this month for nutrition but today states is is uncertain if this will be placed.  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    No Data Recorded  Assessment / Plan / Recommendation CHL IP CLINICAL IMPRESSIONS 07/22/2014  Dysphagia Diagnosis Mild oral phase dysphagia;Mild pharyngeal phase dysphagia  Clinical impression Pt presents with mild oropharyngeal dysphagia.  Oral deficits characterized by decreased lingual coordination/buccal pressure resulting in mild tongue base residuals and piecemealing.  Please note, pt took only 1/8 tsp puree and 1 inch size bite of cracker due to his concerns for odynophagia.  Pharyngeal swallow was overall strong with only mild  residuals that pt largely senses and clears with (delayed at times) reflexive dry swallows.   Trace laryngeal penetration of thin liquids intermittently occured due to decreased laryngeal closure, cued throat clear effective to clear TRACE penetrates.    Suspect pt's largest barrier to normal intake is his odynophagia and gustatory changes with radiation tx.  Of note, pt did report sensation of "it not going down" x1 and pharynx appeared largely clear.    Using live video and verbal/visual feedback, educated pt to findings and compensation strategies to mitigate his dysphagia.    Recommend continue diet as tolerated.  Could pt  take pain medication before meal times to maximize intake/decrease discomfort with intake.  Recommend referral for OP SLP for dysphagia management/generalize strategies/maximize nutrition.          CHL IP TREATMENT RECOMMENDATION 07/22/2014  Treatment Plan Recommendations Defer treatment plan to SLP at (Comment)     CHL IP DIET RECOMMENDATION 07/22/2014  Diet Recommendations Regular;Thin liquid  Liquid Administration via Cup;Straw  Medication Administration (No Data)  Compensations Slow rate;Small sips/bites;Check for pocketing;Clear throat intermittently  Postural Changes and/or Swallow Maneuvers Seated upright 90 degrees;Upright 30-60 min after meal     CHL IP OTHER RECOMMENDATIONS 07/22/2014  Recommended Consults (None)  Oral Care Recommendations Oral care BID  Other Recommendations (None)     CHL IP FOLLOW UP RECOMMENDATIONS 07/22/2014  Follow up Recommendations Outpatient SLP         CHL IP REASON FOR REFERRAL 07/22/2014  Reason for Referral Objectively evaluate swallowing function     CHL IP ORAL PHASE 07/22/2014                    Oral Phase Impaired                    Oral - Nectar Cup Piecemeal swallowing;Weak lingual manipulation              Oral - Thin Cup Piecemeal swallowing;Weak lingual manipulation  Oral - Thin Straw Piecemeal swallowing;Weak lingual manipulation     Oral - Puree Weak lingual manipulation  Oral - Mechanical Soft Weak lingual manipulation;Impaired mastication        Oral - Pill WFL  Oral Phase - Comment mild tongue base residuals clear with pt initiated swallows       CHL IP PHARYNGEAL PHASE 07/22/2014  Pharyngeal Phase Impaired                                      Pharyngeal - Nectar Cup Premature spillage to pyriform sinuses;Pharyngeal residue - valleculae;Reduced tongue base retraction                             Pharyngeal - Thin Cup Pharyngeal residue - valleculae;Reduced tongue base retraction;Premature  spillage to valleculae;Penetration/Aspiration during swallow;Reduced airway/laryngeal closure  Penetration/Aspiration details (thin cup) Material enters airway, remains ABOVE vocal cords and not ejected out;Material enters airway, remains ABOVE vocal cords then ejected out  Pharyngeal - Thin Straw Pharyngeal residue - valleculae;Reduced tongue base retraction;Premature spillage to valleculae;Reduced airway/laryngeal closure;Penetration/Aspiration during swallow  Penetration/Aspiration details (thin straw) Material enters airway, remains ABOVE vocal cords and not ejected out        Pharyngeal - Puree Pharyngeal residue - pyriform sinuses     Pharyngeal - Mechanical Soft Pharyngeal residue - pyriform sinuses  Pharyngeal - Pill Pharyngeal residue - valleculae;Pharyngeal residue - pyriform sinuses  Penetration/Aspiration details (pill) (None)  Pharyngeal Comment cued throat clear cleared trace laryngeal penetration, dry swallows effective to clear pharyngeal stasis, pt reports issues with odynophagia during testing - worse with solids/pudding than thin     CHL IP CERVICAL ESOPHAGEAL PHASE 07/22/2014  Cervical Esophageal Phase WFL                                              CHL IP GO 07/22/2014  Functional Assessment Tool Used MBS, clinical judgement  Functional Limitations Swallowing  Swallow Current Status (J6811) CI  Swallow Goal Status (X7262) CI  Swallow Discharge Status (M3559) [place], [facility] SLP [phone]

## 2014-07-22 NOTE — Patient Instructions (Signed)
Dehydration, Adult Dehydration is when you lose more fluids from the body than you take in. Vital organs like the kidneys, brain, and heart cannot function without a proper amount of fluids and salt. Any loss of fluids from the body can cause dehydration.  CAUSES   Vomiting.  Diarrhea.  Excessive sweating.  Excessive urine output.  Fever. SYMPTOMS  Mild dehydration  Thirst.  Dry lips.  Slightly dry mouth. Moderate dehydration  Very dry mouth.  Sunken eyes.  Skin does not bounce back quickly when lightly pinched and released.  Dark urine and decreased urine production.  Decreased tear production.  Headache. Severe dehydration  Very dry mouth.  Extreme thirst.  Rapid, weak pulse (more than 100 beats per minute at rest).  Cold hands and feet.  Not able to sweat in spite of heat and temperature.  Rapid breathing.  Blue lips.  Confusion and lethargy.  Difficulty being awakened.  Minimal urine production.  No tears. DIAGNOSIS  Your caregiver will diagnose dehydration based on your symptoms and your exam. Blood and urine tests will help confirm the diagnosis. The diagnostic evaluation should also identify the cause of dehydration. TREATMENT  Treatment of mild or moderate dehydration can often be done at home by increasing the amount of fluids that you drink. It is best to drink small amounts of fluid more often. Drinking too much at one time can make vomiting worse. Refer to the home care instructions below. Severe dehydration needs to be treated at the hospital where you will probably be given intravenous (IV) fluids that contain water and electrolytes. HOME CARE INSTRUCTIONS   Ask your caregiver about specific rehydration instructions.  Drink enough fluids to keep your urine clear or pale yellow.  Drink small amounts frequently if you have nausea and vomiting.  Eat as you normally do.  Avoid:  Foods or drinks high in sugar.  Carbonated  drinks.  Juice.  Extremely hot or cold fluids.  Drinks with caffeine.  Fatty, greasy foods.  Alcohol.  Tobacco.  Overeating.  Gelatin desserts.  Wash your hands well to avoid spreading bacteria and viruses.  Only take over-the-counter or prescription medicines for pain, discomfort, or fever as directed by your caregiver.  Ask your caregiver if you should continue all prescribed and over-the-counter medicines.  Keep all follow-up appointments with your caregiver. SEEK MEDICAL CARE IF:  You have abdominal pain and it increases or stays in one area (localizes).  You have a rash, stiff neck, or severe headache.  You are irritable, sleepy, or difficult to awaken.  You are weak, dizzy, or extremely thirsty. SEEK IMMEDIATE MEDICAL CARE IF:   You are unable to keep fluids down or you get worse despite treatment.  You have frequent episodes of vomiting or diarrhea.  You have blood or green matter (bile) in your vomit.  You have blood in your stool or your stool looks black and tarry.  You have not urinated in 6 to 8 hours, or you have only urinated a small amount of very dark urine.  You have a fever.  You faint. MAKE SURE YOU:   Understand these instructions.  Will watch your condition.  Will get help right away if you are not doing well or get worse. Document Released: 05/23/2005 Document Revised: 08/15/2011 Document Reviewed: 01/10/2011 ExitCare Patient Information 2015 [employer]. This information is not intended to replace advice given to you by your health care provider. Make sure you discuss any questions you have with your health care   provider.  

## 2014-07-23 ENCOUNTER — Ambulatory Visit: Admission: RE | Admit: 2014-07-23 | Payer: Medicare Other | Source: Ambulatory Visit

## 2014-07-23 ENCOUNTER — Other Ambulatory Visit: Payer: Self-pay | Admitting: Medical Oncology

## 2014-07-23 ENCOUNTER — Other Ambulatory Visit: Payer: Self-pay | Admitting: Radiation Oncology

## 2014-07-23 ENCOUNTER — Telehealth: Payer: Self-pay | Admitting: *Deleted

## 2014-07-23 ENCOUNTER — Ambulatory Visit: Payer: Medicare Other

## 2014-07-23 ENCOUNTER — Encounter: Payer: Self-pay | Admitting: Radiation Oncology

## 2014-07-23 ENCOUNTER — Ambulatory Visit: Payer: Self-pay

## 2014-07-23 ENCOUNTER — Ambulatory Visit: Payer: Medicare Other | Admitting: Nutrition

## 2014-07-23 VITALS — BP 127/65 | HR 60 | Resp 20

## 2014-07-23 DIAGNOSIS — E860 Dehydration: Secondary | ICD-10-CM

## 2014-07-23 DIAGNOSIS — C4440 Unspecified malignant neoplasm of skin of scalp and neck: Secondary | ICD-10-CM

## 2014-07-23 MED ORDER — ONDANSETRON 8 MG/NS 50 ML IVPB
INTRAVENOUS | Status: AC
  Filled 2014-07-23: qty 8

## 2014-07-23 MED ORDER — SODIUM CHLORIDE 0.9 % IV SOLN
8.0000 mg | Freq: Once | INTRAVENOUS | Status: AC
  Administered 2014-07-23: 8 mg via INTRAVENOUS
  Filled 2014-07-23: qty 4

## 2014-07-23 MED ORDER — POTASSIUM CHLORIDE 2 MEQ/ML IV SOLN
Freq: Once | INTRAVENOUS | Status: AC
  Administered 2014-07-23: 11:00:00 via INTRAVENOUS
  Filled 2014-07-23: qty 1000

## 2014-07-23 NOTE — Progress Notes (Signed)
Per Dr. Valere Dross, [place] to increase IVF rate to 500 ml/hr. Patient has h/o CHF and to monitor closely. MD also informed that wife and daughter are back in room and visibly upset over earlier discussion between MD and patient to stop treatments. Dr. Valere Dross currently in breast clinic and will call Mrs. Sargent this evening. Patient's wife informed and she verbalizes understanding.

## 2014-07-23 NOTE — Progress Notes (Signed)
Radiation notified of family concerns about plan of care. Dr.Murry in to see patient and Stanton Kidney NP, Palliative Care in to address family concerns.

## 2014-07-23 NOTE — Telephone Encounter (Signed)
I spoke with Mrs. Pindell this am and Mr. Dematteo is willing to come in for IV fluids today. He is willing to see Wadie Lessen, NP.  I will stop by to see him in the infusion center as well.  RM

## 2014-07-23 NOTE — Telephone Encounter (Signed)
Received call from Ms Heathcock, patient's wife who states patient is not going to take any more treatments. She states "We've decided to let nature take it's course." Strongly encouraged Ms Mckelvin to have patient complete his last 4 treatments and receive IVF. Reminded her of his appointment today for IVF, to see nutritionist and his appointment for peg tube placement next Tues. She states "we don't want the peg tube." She continued to state, "He cannot take anymore. He cannot take the pain."  Encouraged her to bring him in to talk with Dr Valere Dross today. She refused and stated "He can't do it."  Offered to have Dr Valere Dross and Irena Reichmann call her today. She stated she would appreciate and welcome a call from Dr Valere Dross and Irena Reichmann.  Will route her request and verbally notify them. Called [place], med onc infusion scheduler and Cornelious Bryant, nutritionist and left voice mails informing them of patient's decision and to cancel his appointments today. Requested Sharyn Lull not cancel Mr Baugh remaining IVF appointments until this RN notifies her to do so.

## 2014-07-23 NOTE — Patient Instructions (Signed)
Dehydration, Adult Dehydration is when you lose more fluids from the body than you take in. Vital organs like the kidneys, brain, and heart cannot function without a proper amount of fluids and salt. Any loss of fluids from the body can cause dehydration.  CAUSES   Vomiting.  Diarrhea.  Excessive sweating.  Excessive urine output.  Fever. SYMPTOMS  Mild dehydration  Thirst.  Dry lips.  Slightly dry mouth. Moderate dehydration  Very dry mouth.  Sunken eyes.  Skin does not bounce back quickly when lightly pinched and released.  Dark urine and decreased urine production.  Decreased tear production.  Headache. Severe dehydration  Very dry mouth.  Extreme thirst.  Rapid, weak pulse (more than 100 beats per minute at rest).  Cold hands and feet.  Not able to sweat in spite of heat and temperature.  Rapid breathing.  Blue lips.  Confusion and lethargy.  Difficulty being awakened.  Minimal urine production.  No tears. DIAGNOSIS  Your caregiver will diagnose dehydration based on your symptoms and your exam. Blood and urine tests will help confirm the diagnosis. The diagnostic evaluation should also identify the cause of dehydration. TREATMENT  Treatment of mild or moderate dehydration can often be done at home by increasing the amount of fluids that you drink. It is best to drink small amounts of fluid more often. Drinking too much at one time can make vomiting worse. Refer to the home care instructions below. Severe dehydration needs to be treated at the hospital where you will probably be given intravenous (IV) fluids that contain water and electrolytes. HOME CARE INSTRUCTIONS   Ask your caregiver about specific rehydration instructions.  Drink enough fluids to keep your urine clear or pale yellow.  Drink small amounts frequently if you have nausea and vomiting.  Eat as you normally do.  Avoid:  Foods or drinks high in sugar.  Carbonated  drinks.  Juice.  Extremely hot or cold fluids.  Drinks with caffeine.  Fatty, greasy foods.  Alcohol.  Tobacco.  Overeating.  Gelatin desserts.  Wash your hands well to avoid spreading bacteria and viruses.  Only take over-the-counter or prescription medicines for pain, discomfort, or fever as directed by your caregiver.  Ask your caregiver if you should continue all prescribed and over-the-counter medicines.  Keep all follow-up appointments with your caregiver. SEEK MEDICAL CARE IF:  You have abdominal pain and it increases or stays in one area (localizes).  You have a rash, stiff neck, or severe headache.  You are irritable, sleepy, or difficult to awaken.  You are weak, dizzy, or extremely thirsty. SEEK IMMEDIATE MEDICAL CARE IF:   You are unable to keep fluids down or you get worse despite treatment.  You have frequent episodes of vomiting or diarrhea.  You have blood or green matter (bile) in your vomit.  You have blood in your stool or your stool looks black and tarry.  You have not urinated in 6 to 8 hours, or you have only urinated a small amount of very dark urine.  You have a fever.  You faint. MAKE SURE YOU:   Understand these instructions.  Will watch your condition.  Will get help right away if you are not doing well or get worse. Document Released: 05/23/2005 Document Revised: 08/15/2011 Document Reviewed: 01/10/2011 ExitCare Patient Information 2015 [employer]. This information is not intended to replace advice given to you by your health care provider. Make sure you discuss any questions you have with your health care   provider.  

## 2014-07-23 NOTE — Telephone Encounter (Signed)
9:00 am Dr Valere Dross states he spoke with Ms Clarida, and patient will come in today for IVF.  Spoke with Diane, RN charge for infusion today. She scheduled patient for IVF today at 10:00 am. Spoke with Ms Minkler and informed her of patient's time; she states "I can bring him there." Informed her that Dr Valere Dross will see patient today as well. She verbalized understanding, agreement.   Left voice mail for Cornelious Bryant to inform her patient will be here in the event she can touch base with him today. Left voice mail on Saul Fordyce cell phone requesting she reach out to Ms [place] today.

## 2014-07-23 NOTE — Progress Notes (Signed)
Received message from nursing that patient was canceling radiation treatments. Patient did agree to come for IV fluids today. Brief nutrition follow-up with patient and wife. Wife reports patient has decided to receive hospice care.   Per patient's wife, patient would not take any ice cubes in his mouth. She has remained extremely concerned with his ability to eat and drink. Patient has been unable to consume oral nutrition supplements secondary to increased pain. Provided support and listened to patient and wife.  **Disclaimer: This note was dictated with voice recognition software. Similar sounding words can inadvertently be transcribed and this note may contain transcription errors which may not have been corrected upon publication of note.**

## 2014-07-23 NOTE — Telephone Encounter (Signed)
Received call from Ms Kellie Simmering who stated she is patient's oldest daughter. She stated that she received a call from both her mother and her father stating that he "is giving up and going home for Hospice care". She stated she "does not understand what has happened", that she "spoke to them this morning, and everything was fine". She stated she is aware that he "is receiving an infusion today". She asked multiple questions; informed her that this RN cannot release information to her. Her name is not on patient's demographic's. She stated she will be coming but lives in [place] and isn't sure when she'll arrive. She states her sister will be here within the hour. Ms Wallene Huh requested this RN call her back with any information she is allowed to have, [phone].

## 2014-07-23 NOTE — Progress Notes (Signed)
CC: Dr. [name], Adrian Lessen, NP, Adrian Beckers, RN, Adrian Buff, RN Weekly Management Note:  Site: Right parotid bed/post auricular/skull base/upper neck Current Dose:  5800  cGy Projected Dose: 6600  cGy  Narrative: The patient is seen today for routine under treatment assessment. CBCT/MVCT images/port films were reviewed. The chart was reviewed.   The patient visited this morning for IV fluids.  I saw him briefly earlier this afternoon.  His wife was at lunch.  The patient has decided that he wants to stop radiation therapy and receive hospice care.  His major problem is that of oral cavity and oropharyngeal discomfort even though he is on a fentanyl patch along with hydrocodone when necessary breakthrough pain.  His by mouth intake is minimal although his weight loss through this past Monday has been minimal.  Unfortunately, he is not scheduled for placement of a feeding tube until next week because of scheduling issues.Marland Kitchen  He is scheduled for additional fluids tomorrow upstairs.  I spoke with his wife this evening, and she is most appreciative of that time and effort spent by Adrian Lessen NP today. His wife.  I hear I will refill his narcotic pain medications since he is on his last fentanyl patch.  Physical Examination: Wt Readings from Last 3 Encounters:  07/21/14 169 lb 9.6 oz (76.93 kg)  07/14/14 171 lb 3.2 oz (77.656 kg)  07/09/14 175 lb 6.4 oz (79.561 kg)   Temp Readings from Last 3 Encounters:  07/22/14 98.9 F (37.2 C) Oral  07/21/14 98.1 F (36.7 C) Oral  07/21/14 97.9 F (36.6 C) Oral   BP Readings from Last 3 Encounters:  07/23/14 127/65  07/22/14 108/65  07/21/14 106/76   Pulse Readings from Last 3 Encounters:  07/23/14 60  07/22/14 78  07/21/14 66   I did not examine him today.  Impression: Adrian West has finally decided that he would like to have hospice care.  He is family are interested in placement of a PEG tube, and we will contact radiology of see if this  can be moved up to this week instead of waiting until next week.  He will receive more IV fluids tomorrow.  Plan: As above.

## 2014-07-23 NOTE — Telephone Encounter (Addendum)
Went to med onc infusion to see wife. One daughter is present. She is tearful and asking her mother many questions re: feeding tube. She requests to talk with Dr Valere Dross today. Informed her that he is in [facility] until later this afternoon. Ms Gelardi requests to speak with Wadie Lessen, NP.  Informed Ms Diskin and her daughter that this RN will notify Dr Valere Dross via page that daughter wishes to speak with him. Also informed Ms Iwasaki this RN will call Burtis Junes again and request she contact Mrs Lemma today if at all possible. Ms Nussbaumer and her daughter verbalized understanding, appreciation.  Spoke directly with Wadie Lessen and updated her on patient. She stated she would come to [facility] and see patient and family.   Patient, wife and daughter met with Burtis Junes, NP in rm 1. Burtis Junes, NP escorted family to radiology to pick up contrast for patient's scheduled peg tube placement on 07/29/14.

## 2014-07-24 ENCOUNTER — Other Ambulatory Visit: Payer: Self-pay | Admitting: Radiology

## 2014-07-24 ENCOUNTER — Ambulatory Visit: Payer: Medicare Other

## 2014-07-24 ENCOUNTER — Other Ambulatory Visit: Payer: Self-pay | Admitting: Radiation Oncology

## 2014-07-24 ENCOUNTER — Ambulatory Visit
Admission: RE | Admit: 2014-07-24 | Discharge: 2014-07-24 | Disposition: A | Discharge status: Home / Self Care | Payer: Medicare Other | Source: Ambulatory Visit | Attending: Radiation Oncology | Admitting: Radiation Oncology

## 2014-07-24 VITALS — BP 119/59 | HR 60 | Temp 97.4°F | Resp 18

## 2014-07-24 DIAGNOSIS — R1310 Dysphagia, unspecified: Secondary | ICD-10-CM

## 2014-07-24 DIAGNOSIS — E860 Dehydration: Secondary | ICD-10-CM

## 2014-07-24 DIAGNOSIS — C4440 Unspecified malignant neoplasm of skin of scalp and neck: Secondary | ICD-10-CM

## 2014-07-24 MED ORDER — POTASSIUM CHLORIDE 2 MEQ/ML IV SOLN
INTRAVENOUS | Status: AC
  Administered 2014-07-24: 09:00:00 via INTRAVENOUS
  Filled 2014-07-24: qty 1000

## 2014-07-24 MED ORDER — HYDROCODONE-ACETAMINOPHEN 5-325 MG PO TABS: 1.0000 | ORAL_TABLET | Freq: Four times a day (QID) | ORAL | Status: DC | PRN

## 2014-07-24 MED ORDER — FENTANYL 37.5 MCG/HR TD PT72: 37.5000 ug/h | MEDICATED_PATCH | TRANSDERMAL | Status: DC

## 2014-07-24 NOTE — Patient Instructions (Signed)
Dehydration, Adult Dehydration is when you lose more fluids from the body than you take in. Vital organs like the kidneys, brain, and heart cannot function without a proper amount of fluids and salt. Any loss of fluids from the body can cause dehydration.  CAUSES   Vomiting.  Diarrhea.  Excessive sweating.  Excessive urine output.  Fever. SYMPTOMS  Mild dehydration  Thirst.  Dry lips.  Slightly dry mouth. Moderate dehydration  Very dry mouth.  Sunken eyes.  Skin does not bounce back quickly when lightly pinched and released.  Dark urine and decreased urine production.  Decreased tear production.  Headache. Severe dehydration  Very dry mouth.  Extreme thirst.  Rapid, weak pulse (more than 100 beats per minute at rest).  Cold hands and feet.  Not able to sweat in spite of heat and temperature.  Rapid breathing.  Blue lips.  Confusion and lethargy.  Difficulty being awakened.  Minimal urine production.  No tears. DIAGNOSIS  Your caregiver will diagnose dehydration based on your symptoms and your exam. Blood and urine tests will help confirm the diagnosis. The diagnostic evaluation should also identify the cause of dehydration. TREATMENT  Treatment of mild or moderate dehydration can often be done at home by increasing the amount of fluids that you drink. It is best to drink small amounts of fluid more often. Drinking too much at one time can make vomiting worse. Refer to the home care instructions below. Severe dehydration needs to be treated at the hospital where you will probably be given intravenous (IV) fluids that contain water and electrolytes. HOME CARE INSTRUCTIONS   Ask your caregiver about specific rehydration instructions.  Drink enough fluids to keep your urine clear or pale yellow.  Drink small amounts frequently if you have nausea and vomiting.  Eat as you normally do.  Avoid:  Foods or drinks high in sugar.  Carbonated  drinks.  Juice.  Extremely hot or cold fluids.  Drinks with caffeine.  Fatty, greasy foods.  Alcohol.  Tobacco.  Overeating.  Gelatin desserts.  Wash your hands well to avoid spreading bacteria and viruses.  Only take over-the-counter or prescription medicines for pain, discomfort, or fever as directed by your caregiver.  Ask your caregiver if you should continue all prescribed and over-the-counter medicines.  Keep all follow-up appointments with your caregiver. SEEK MEDICAL CARE IF:  You have abdominal pain and it increases or stays in one area (localizes).  You have a rash, stiff neck, or severe headache.  You are irritable, sleepy, or difficult to awaken.  You are weak, dizzy, or extremely thirsty. SEEK IMMEDIATE MEDICAL CARE IF:   You are unable to keep fluids down or you get worse despite treatment.  You have frequent episodes of vomiting or diarrhea.  You have blood or green matter (bile) in your vomit.  You have blood in your stool or your stool looks black and tarry.  You have not urinated in 6 to 8 hours, or you have only urinated a small amount of very dark urine.  You have a fever.  You faint. MAKE SURE YOU:   Understand these instructions.  Will watch your condition.  Will get help right away if you are not doing well or get worse. Document Released: 05/23/2005 Document Revised: 08/15/2011 Document Reviewed: 01/10/2011 ExitCare Patient Information 2015 [employer]. This information is not intended to replace advice given to you by your health care provider. Make sure you discuss any questions you have with your health care   provider.  

## 2014-07-25 ENCOUNTER — Telehealth: Payer: Self-pay | Admitting: *Deleted

## 2014-07-25 ENCOUNTER — Ambulatory Visit: Payer: Medicare Other

## 2014-07-25 ENCOUNTER — Other Ambulatory Visit: Payer: Self-pay | Admitting: *Deleted

## 2014-07-25 ENCOUNTER — Other Ambulatory Visit: Payer: Self-pay | Admitting: Radiation Oncology

## 2014-07-25 ENCOUNTER — Ambulatory Visit (HOSPITAL_COMMUNITY)
Admission: RE | Admit: 2014-07-25 | Discharge: 2014-07-25 | Disposition: A | Discharge status: Home / Self Care | Payer: Medicare Other | Source: Ambulatory Visit | Attending: Radiation Oncology | Admitting: Radiation Oncology

## 2014-07-25 ENCOUNTER — Ambulatory Visit: Payer: Self-pay

## 2014-07-25 ENCOUNTER — Encounter (HOSPITAL_COMMUNITY)
Admission: RE | Admit: 2014-07-25 | Discharge: 2014-07-25 | Disposition: A | Discharge status: Home / Self Care | Payer: Medicare Other | Source: Ambulatory Visit | Attending: Radiology | Admitting: Radiology

## 2014-07-25 DIAGNOSIS — R1314 Dysphagia, pharyngoesophageal phase: Secondary | ICD-10-CM | POA: Diagnosis not present

## 2014-07-25 DIAGNOSIS — Z7902 Long term (current) use of antithrombotics/antiplatelets: Secondary | ICD-10-CM | POA: Diagnosis not present

## 2014-07-25 DIAGNOSIS — C4440 Unspecified malignant neoplasm of skin of scalp and neck: Secondary | ICD-10-CM

## 2014-07-25 DIAGNOSIS — K219 Gastro-esophageal reflux disease without esophagitis: Secondary | ICD-10-CM | POA: Diagnosis not present

## 2014-07-25 DIAGNOSIS — K5730 Diverticulosis of large intestine without perforation or abscess without bleeding: Secondary | ICD-10-CM | POA: Diagnosis not present

## 2014-07-25 DIAGNOSIS — E079 Disorder of thyroid, unspecified: Secondary | ICD-10-CM | POA: Diagnosis not present

## 2014-07-25 DIAGNOSIS — Z923 Personal history of irradiation: Secondary | ICD-10-CM | POA: Insufficient documentation

## 2014-07-25 DIAGNOSIS — K449 Diaphragmatic hernia without obstruction or gangrene: Secondary | ICD-10-CM | POA: Diagnosis not present

## 2014-07-25 DIAGNOSIS — E785 Hyperlipidemia, unspecified: Secondary | ICD-10-CM | POA: Diagnosis not present

## 2014-07-25 DIAGNOSIS — Z8673 Personal history of transient ischemic attack (TIA), and cerebral infarction without residual deficits: Secondary | ICD-10-CM | POA: Diagnosis not present

## 2014-07-25 DIAGNOSIS — R1310 Dysphagia, unspecified: Secondary | ICD-10-CM

## 2014-07-25 DIAGNOSIS — Z85828 Personal history of other malignant neoplasm of skin: Secondary | ICD-10-CM | POA: Diagnosis not present

## 2014-07-25 DIAGNOSIS — Z79899 Other long term (current) drug therapy: Secondary | ICD-10-CM | POA: Diagnosis not present

## 2014-07-25 DIAGNOSIS — Z7982 Long term (current) use of aspirin: Secondary | ICD-10-CM | POA: Diagnosis not present

## 2014-07-25 DIAGNOSIS — C760 Malignant neoplasm of head, face and neck: Secondary | ICD-10-CM | POA: Diagnosis not present

## 2014-07-25 DIAGNOSIS — C4441 Basal cell carcinoma of skin of scalp and neck: Secondary | ICD-10-CM | POA: Diagnosis not present

## 2014-07-25 DIAGNOSIS — E46 Unspecified protein-calorie malnutrition: Secondary | ICD-10-CM | POA: Diagnosis not present

## 2014-07-25 DIAGNOSIS — R1312 Dysphagia, oropharyngeal phase: Secondary | ICD-10-CM | POA: Insufficient documentation

## 2014-07-25 DIAGNOSIS — I10 Essential (primary) hypertension: Secondary | ICD-10-CM | POA: Insufficient documentation

## 2014-07-25 DIAGNOSIS — E860 Dehydration: Secondary | ICD-10-CM

## 2014-07-25 LAB — CBC
HCT: 42.1 % (ref 39.0–52.0)
HEMOGLOBIN: 13.8 g/dL (ref 13.0–17.0)
MCH: 27.4 pg (ref 26.0–34.0)
MCHC: 32.8 g/dL (ref 30.0–36.0)
MCV: 83.5 fL (ref 78.0–100.0)
PLATELETS: 188 10*3/uL (ref 150–400)
RBC: 5.04 MIL/uL (ref 4.22–5.81)
RDW: 13.9 % (ref 11.5–15.5)
WBC: 4.8 10*3/uL (ref 4.0–10.5)

## 2014-07-25 LAB — BASIC METABOLIC PANEL
ANION GAP: 9 (ref 5–15)
BUN: 11 mg/dL (ref 6–23)
CALCIUM: 9.3 mg/dL (ref 8.4–10.5)
CO2: 26 mmol/L (ref 19–32)
CREATININE: 0.99 mg/dL (ref 0.50–1.35)
Chloride: 104 mmol/L (ref 96–112)
GFR, EST AFRICAN AMERICAN: 85 mL/min — AB (ref >90)
GFR, EST NON AFRICAN AMERICAN: 73 mL/min — AB (ref >90)
Glucose, Bld: 92 mg/dL (ref 70–99)
POTASSIUM: 3.9 mmol/L (ref 3.5–5.1)
SODIUM: 139 mmol/L (ref 135–145)

## 2014-07-25 LAB — PROTIME-INR
INR: 1.09 (ref 0.00–1.49)
PROTHROMBIN TIME: 14.2 s (ref 11.6–15.2)

## 2014-07-25 LAB — APTT: APTT: 32 s (ref 24–37)

## 2014-07-25 MED ORDER — SODIUM CHLORIDE 0.9 % IV SOLN
INTRAVENOUS | Status: AC | PRN
  Administered 2014-07-25: 10 mL/h via INTRAVENOUS

## 2014-07-25 MED ORDER — CEFAZOLIN SODIUM-DEXTROSE 2-3 GM-% IV SOLR
2.0000 g | Freq: Once | INTRAVENOUS | Status: AC
  Administered 2014-07-25: 2 g via INTRAVENOUS
  Filled 2014-07-25: qty 50

## 2014-07-25 MED ORDER — GLUCAGON HCL RDNA (DIAGNOSTIC) 1 MG IJ SOLR
INTRAMUSCULAR | Status: AC | PRN
  Administered 2014-07-25: 1 mg via INTRAVENOUS

## 2014-07-25 MED ORDER — LIDOCAINE HCL 1 % IJ SOLN
INTRAMUSCULAR | Status: AC
  Filled 2014-07-25: qty 20

## 2014-07-25 MED ORDER — FENTANYL CITRATE 0.05 MG/ML IJ SOLN
INTRAMUSCULAR | Status: AC
  Filled 2014-07-25: qty 2

## 2014-07-25 MED ORDER — MIDAZOLAM HCL 2 MG/2ML IJ SOLN
INTRAMUSCULAR | Status: AC | PRN
  Administered 2014-07-25 (×2): 0.5 mg via INTRAVENOUS

## 2014-07-25 MED ORDER — POTASSIUM CHLORIDE 2 MEQ/ML IV SOLN
INTRAVENOUS | Status: AC
  Filled 2014-07-25: qty 1000

## 2014-07-25 MED ORDER — MIDAZOLAM HCL 2 MG/2ML IJ SOLN
INTRAMUSCULAR | Status: AC
  Filled 2014-07-25: qty 2

## 2014-07-25 MED ORDER — FENTANYL CITRATE 0.05 MG/ML IJ SOLN
INTRAMUSCULAR | Status: AC | PRN
  Administered 2014-07-25 (×2): 12.5 ug via INTRAVENOUS

## 2014-07-25 MED ORDER — SODIUM CHLORIDE 0.9 % IV SOLN: Freq: Once | INTRAVENOUS | Status: DC

## 2014-07-25 MED ORDER — GLUCAGON HCL RDNA (DIAGNOSTIC) 1 MG IJ SOLR
INTRAMUSCULAR | Status: AC
  Filled 2014-07-25: qty 1

## 2014-07-25 MED ORDER — IOHEXOL 300 MG/ML  SOLN
50.0000 mL | Freq: Once | INTRAMUSCULAR | Status: AC | PRN
  Administered 2014-07-25: 15 mL

## 2014-07-25 MED ORDER — POTASSIUM CHLORIDE 2 MEQ/ML IV SOLN: INTRAVENOUS | Status: DC

## 2014-07-25 NOTE — Sedation Documentation (Signed)
Pt discharged via wheelchair with daughter and wife to main entrance of hospital.

## 2014-07-25 NOTE — Telephone Encounter (Signed)
Called and left a voicemail message at Mr. Muniz home to inform him of his Hydration appointment in medical oncology on Saturday 07/26/14 at 0900.  Also called his daughter, Kellie Simmering, with the same information.  Informed both that he will receive 1000 ml of NS with 20 meg of potassium as ordered by Dr. Tammi Klippel.

## 2014-07-25 NOTE — Telephone Encounter (Signed)
Dr. Reesa Chew, from Interventional Radiology called to relay information that he was he was unable to place "perc Gtube under fluoro because of High position of the transverse colon in relation to the stomach. Access would be through the transverse mesocolon which is high risk for vascular injury. rec surgical G-tube placement".  Family requesting IVF hydration on tomorrow and Medical Oncology can provide this service on 07/26/14 at 0900.  Question if potassium needed for level of 3.9.

## 2014-07-25 NOTE — Sedation Documentation (Signed)
Family updated as to patient's status by Dr Annamaria Boots.

## 2014-07-25 NOTE — Procedures (Signed)
Unable to place perc Gtube under fluoro because of High position of the transverse colon in relation to the stomach.  Access would be through the transverse mesocolon which is high risk for vascular injury.  rec surgical Gtube placement

## 2014-07-25 NOTE — H&P (Signed)
Chief Complaint: "I'm here to get a stomach tube"  Referring Physician(s): Squire,Sarah  History of Present Illness: Adrian West is a 81 y.o. 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   Past Medical History  Diagnosis Date  . Thyroid disease   . Hypertension   . GERD (gastroesophageal reflux disease)     PMH of stricture , recurrent;[name]  . Benign neoplasm of colon   . Tremor, essential   . Diverticulosis   . TIA (transient ischemic attack) May 2012  . Skin cancer   . Neuropathy, peripheral   . Lung abnormality 11/25/2008    "nerve damage from heart surgery"  . Hyperlipidemia   . MGUS (monoclonal gammopathy of unknown significance) 04/11/2013  . Diaphragm paralysis   . Esophageal stricture   . Hiatal hernia   . Bell's palsy   . Vertigo 2005  . Gait disturbance     Past Surgical History  Procedure Laterality Date  . Appendectomy    . Cataract extraction      [name]  . Cholecystectomy  1998    [name]  . Inguinal hernia repair    . Lumbar laminectomy    . Nasal sinus surgery    . Tonsillectomy    . Esophageal dilation       X3;[name]  . 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 Laparoscopic ablation renal mass  08/14/2009  . Mohs surgery  2012     ear  . Tilt table study N/A 05/06/2013    Procedure: TILT TABLE STUDY;  Surgeon: [name], MD;  Location: [facility] CATH LAB;  Service: Cardiovascular;  Laterality: N/A;  . Right heart catheterization N/A 03/03/2014    Procedure: RIGHT HEART CATH;  Surgeon: [name], MD;  Location: [facility] CATH LAB;  Service: Cardiovascular;  Laterality: N/A;    Allergies: Review of patient's allergies indicates no known allergies.  Medications: Prior to Admission medications   Medication  Sig Start Date End Date Taking? Authorizing Provider  acetaminophen (TYLENOL) 500 MG tablet Take 500 mg by mouth every 6 (six) hours as needed (As needed for pain).   Yes Historical Provider, MD  aspirin EC 81 MG tablet Take 81 mg by mouth daily.   Yes Historical Provider, MD  atorvastatin (LIPITOR) 10 MG tablet TAKE ONE TABLET EACH DAY 07/15/14   [name], MD  bisacodyl (DULCOLAX) 5 MG EC tablet Take 5 mg by mouth daily as needed for moderate constipation. 10 mg po orally, as needed    Historical Provider, MD  clopidogrel (PLAVIX) 75 MG tablet TAKE ONE TABLET BY MOUTH ONCE DAILY 07/11/14   [name], MD  erythromycin ophthalmic ointment 1 application 2 (two) times daily. Place a thin strip into the right eye twice daily    Historical Provider, MD  FentaNYL 37.5 MCG/HR PT72 Place 37.5 mcg onto the skin every 3 (three) days. For long term pain control. 07/21/14   [name], MD  FentaNYL 37.5 MCG/HR PT72 Place 37.5 mcg/hr onto the skin every 3 (three) days. 07/24/14   Rexene Edison, MD  HYDROcodone-acetaminophen (NORCO) 5-325 MG per tablet Take 1 tablet by mouth every 6 (six) hours as needed for moderate pain. 07/24/14   Rexene Edison, MD  HYDROcodone-acetaminophen ([place]) 7.5-325 MG per  tablet Take 1 tablet by mouth every 4 (four) hours as needed for moderate pain. 07/21/14   [name], MD  Hypromellose (GENTEAL MILD OP) Apply 1 application to eye as needed (both eyes).    Historical Provider, MD  ibuprofen (ADVIL,MOTRIN) 600 MG tablet Take 600 mg by mouth 3 (three) times daily. 02/04/14   Historical Provider, MD  levothyroxine (SYNTHROID, LEVOTHROID) 150 MCG tablet Take 150 mcg by mouth daily before breakfast.    Historical Provider, MD  lidocaine (XYLOCAINE) 2 % solution Patient: Mix 1part 2% viscous lidocaine, 1part H20. Swish and/or swallow 41mL of this mixture, 55min before meals and at bedtime, up to QID, for soreness 06/23/14   Historical Provider, MD  metoprolol succinate (TOPROL-XL)  25 MG 24 hr tablet Take 25 mg by mouth daily.    Historical Provider, MD  mineral oil enema Place 133 mLs (1 enema total) rectally once. For severe constipation. 06/30/14   [name], MD  Multiple Vitamin (MULTIVITAMIN WITH MINERALS) TABS tablet Take 1 tablet by mouth daily.    Historical Provider, MD  omeprazole (PRILOSEC) 40 MG capsule Take 40 mg by mouth daily.    Historical Provider, MD  polyvinyl alcohol (LIQUIFILM TEARS) 1.4 % ophthalmic solution 2 drops at bedtime.    Historical Provider, MD  senna (SENOKOT) 8.6 MG TABS tablet Take 2 tablets (17.2 mg total) by mouth 2 (two) times daily as needed for mild constipation. To prevent constipation. 06/30/14   [name], MD  silver sulfADIAZINE (SILVADENE) 1 % cream Apply 1 application topically 2 (two) times daily.    Historical Provider, MD  sodium fluoride (FLUORISHIELD) 1.1 % GEL dental gel Instill one drop of gel per tooth space of fluoride tray. Place over teeth for 5 minutes. Remove. Spit out excess. Repeat nightly. 05/26/14   Lenn Cal, DDS  tamsulosin (FLOMAX) 0.4 MG CAPS capsule Take 0.4 mg by mouth.    Historical Provider, MD    Family History  Problem Relation Age of Onset  . Heart attack Father 10  . Colon cancer Mother 42  . Heart attack Maternal Grandmother 60  . Uterine cancer Paternal Grandmother   . Stroke Neg Hx   . Diabetes Neg Hx   . Healthy Sister     History   Social History  . Marital Status: Married    Spouse Name: N/A  . Number of Children: N/A  . Years of Education: N/A   Occupational History  . retired Equities trader     Social History Main Topics  . Smoking status: Never Smoker   . Smokeless tobacco: Never Used  . Alcohol Use: 0.0 oz/week    0 Standard drinks or equivalent per week     Comment: Drinks wine 3 per night  . Drug Use: No  . Sexual Activity: Not Currently   Other Topics Concern  . 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   Married x 53 years.  They have three daughters.      Review of Systems   Constitutional: Negative for fever and chills.  HENT: Positive for hearing loss and trouble swallowing.   Respiratory:       Occ cough, dyspnea  Cardiovascular: Negative for chest pain.  Gastrointestinal: Positive for nausea and vomiting. Negative for abdominal pain and blood in stool.  Genitourinary: Negative for dysuria and hematuria.  Musculoskeletal: Negative for back pain.  Neurological: Positive for headaches.  LE neuropathy    Vital Signs: BP 114/70 mmHg  Pulse 59  Temp(Src) 97.6 F (36.4 C) (Oral)  Resp 18  Ht 5\' 7"  (1.702 m)  Wt 170 lb (77.111 kg)  BMI 26.62 kg/m2  SpO2 100%  Physical Exam  Constitutional: He is oriented to person, place, and time. He appears well-developed and well-nourished.  Neck:  Skin desquamation over rt neck /facial region; partial rt ear removal  Cardiovascular: Normal rate and regular rhythm.   Murmur heard. Pulmonary/Chest: Effort normal and breath sounds normal.  Abdominal: Soft. Bowel sounds are normal. There is no tenderness.  Musculoskeletal: He exhibits no edema.  Neurological: He is alert and oriented to person, place, and time.    Imaging: Dg Op Swallowing Func-medicare/speech Path  07/22/2014   CLINICAL DATA: dysphagia   FLUOROSCOPY FOR SWALLOWING FUNCTION STUDY:  Fluoroscopy was provided for swallowing function study, which was  administered by a speech pathologist.  Final results and recommendations  from this study are contained within the speech pathology report.     Labs:  CBC:  Recent Labs  03/03/14 1020 03/31/14 1140 06/24/14 1019 07/25/14 1142  WBC 5.6 7.2 4.2 4.8  HGB 15.4 15.0 13.6 13.8  HCT 44.6 44.9 42.9 42.1  PLT 195 326 231 188    COAGS:  Recent Labs  10/07/13 03/03/14 1020 03/31/14 1140  INR 0.8* 0.96 1.00    BMP:  Recent Labs  10/22/13 1218 01/10/14 0912 02/13/14 1119  03/03/14 1020 06/24/14 1019 07/08/14 0846 07/14/14 1157 07/17/14 0906  NA 136 138 137 139 141 142 143 142  K 4.1 4.4 4.7 4.5 4.8 3.6 3.7 3.8  CL 99 106 101 103  --   --   --   --   CO2 30 25 29 27 28 26 24 26   GLUCOSE 80 94 91 98 85 65* 97 96  BUN 17 17 17 14  14.2 27.2* 25.0 18.5  CALCIUM 8.7 8.5 9.2 8.7 9.1 9.3 9.9 9.4  CREATININE 1.1 1.0 1.0 0.90 0.9 1.0 1.0 0.9  GFRNONAA  --   --   --  72*  --   --   --   --   GFRAA  --   --   --  12*  --   --   --   --     LIVER FUNCTION TESTS:  Recent Labs  10/22/13 1218  BILITOT 0.5  AST 21  ALT 23  ALKPHOS 60  PROT 5.6*  ALBUMIN 3.0*    TUMOR MARKERS: No results for input(s): AFPTM, CEA, CA199, CHROMGRNA in the last 8760 hours.  Assessment and Plan: Adrian West is a 81 y.o. 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 Details/risks of procedure d/w pt/family with their understanding and consent.   Signed: Autumn Messing 07/25/2014, 12:33 PM   20 minutes were spent with pt in preparation for gastrostomy tube placement

## 2014-07-25 NOTE — Sedation Documentation (Signed)
Pt transported to nursing station via stretcher for recovery

## 2014-07-25 NOTE — Sedation Documentation (Signed)
Patient denies pain and is resting comfortably.  

## 2014-07-25 NOTE — Progress Notes (Signed)
Progress Note from the Palliative Medicine Team at [facility]   This NP meet today with the patient and his wife [name]) in the OP Radiation-Oncology clinic at the patient's request.  He wished to continue last week's discussion in the presence of his family concerning associated radiation treatment side effects, symptoms of pain, dysphagia, weakness and fatigue.    Subjective:    He reports extreme weakness/fatigue and his wife reports he is "sleeping all the time".  He reports poor po intake, per wife is is taking "just about nothing".  He is here today for his fluid replacement treatment, which at this time is 3X a week   Objective: No Known Allergies Scheduled Meds: Continuous Infusions: PRN Meds:.  There were no vitals taken for this visit.     No intake or output data in the 24 hours ending 07/25/14 1052     Physical Exam:  General: chronically ill appearing, NAD HEENT:  Noted surgical and radiation changes Ext: no lower extremity edema noted Neuro: alert and oriented X3, fully engaged in today's conversation  Labs: CBC    Component Value Date/Time   WBC 4.2 06/24/2014 1019   WBC 7.2 03/31/2014 1140   RBC 4.83 06/24/2014 1019   RBC 4.79 03/31/2014 1140   HGB 13.6 06/24/2014 1019   HGB 15.0 03/31/2014 1140   HCT 42.9 06/24/2014 1019   HCT 44.9 03/31/2014 1140   PLT 231 06/24/2014 1019   PLT 326 03/31/2014 1140   MCV 88.8 06/24/2014 1019   MCV 93.7 03/31/2014 1140   MCH 28.2 06/24/2014 1019   MCH 31.3 03/31/2014 1140   MCHC 31.7* 06/24/2014 1019   MCHC 33.4 03/31/2014 1140   RDW 13.5 06/24/2014 1019   RDW 13.0 03/31/2014 1140   LYMPHSABS 0.6* 06/24/2014 1019   LYMPHSABS 0.8 03/31/2014 1140   MONOABS 0.4 06/24/2014 1019   MONOABS 0.3 03/31/2014 1140   EOSABS 0.2 06/24/2014 1019   EOSABS 0.1 03/31/2014 1140   BASOSABS 0.0 06/24/2014 1019   BASOSABS 0.0 03/31/2014 1140    BMET    Component Value Date/Time   NA 142 07/17/2014 0906   NA 139  03/03/2014 1020   K 3.8 07/17/2014 0906   K 4.5 03/03/2014 1020   CL 103 03/03/2014 1020   CO2 26 07/17/2014 0906   CO2 27 03/03/2014 1020   GLUCOSE 96 07/17/2014 0906   GLUCOSE 98 03/03/2014 1020   BUN 18.5 07/17/2014 0906   BUN 14 03/03/2014 1020   CREATININE 0.9 07/17/2014 0906   CREATININE 0.90 03/03/2014 1020   CREATININE 1.20 10/12/2010 1257   CALCIUM 9.4 07/17/2014 0906   CALCIUM 8.7 03/03/2014 1020   GFRNONAA 76* 03/03/2014 1020   GFRAA 88* 03/03/2014 1020    CMP     Component Value Date/Time   NA 142 07/17/2014 0906   NA 139 03/03/2014 1020   K 3.8 07/17/2014 0906   K 4.5 03/03/2014 1020   CL 103 03/03/2014 1020   CO2 26 07/17/2014 0906   CO2 27 03/03/2014 1020   GLUCOSE 96 07/17/2014 0906   GLUCOSE 98 03/03/2014 1020   BUN 18.5 07/17/2014 0906   BUN 14 03/03/2014 1020   CREATININE 0.9 07/17/2014 0906   CREATININE 0.90 03/03/2014 1020   CREATININE 1.20 10/12/2010 1257   CALCIUM 9.4 07/17/2014 0906   CALCIUM 8.7 03/03/2014 1020   PROT 5.6* 10/22/2013 1218   ALBUMIN 3.0* 10/22/2013 1218   AST 21 10/22/2013 1218   ALT 23 10/22/2013 1218  ALKPHOS 60 10/22/2013 1218   BILITOT 0.5 10/22/2013 1218   GFRNONAA 76* 03/03/2014 1020   GFRAA 88* 03/03/2014 1020     Assessment and Plan 1. Code Status: DNR/DNI-documented today in chart, patient to bring legal paperwork for scanning 2. Symptom Control:  Pain: continue with current Fentanyl patch, mouth moistening techniques per nutrition for soreness/dryness  Dysphagia: long conversation regarding risks and benefits of short term use of PEG for nutritional support during the duration of his final 5 radiation treatments and the next 4-6 weeks of post healing process.                      -Dr Isidore Moos to contact IR to schedule time/date for PEG                    -he will receive fluid replacement fluids daily this week                    -patient and his family are encouraged to continue conversation regarding the  risks and benefits                      and the intention of this PEG intervention.  He is strongly encouraged to continue to try to take                   POs, frequent sips, easy/soft foods to swallow.     3. Psycho/Social: Emotional support offered to patient and his wife.  This "whole thing" is over whelming for both.  They have three daughters who live in [place].  They live in [place] at [facility].  Encouraged to update and include daughters in conversation regarding his care plan    Time In Time Out Total Time Spent with Patient Total Overall Time  1100 1200 60 min 60 min    Greater than 50%  of this time was spent counseling and coordinating care related to the above assessment and plan. Patient and his family are encouraged to call with questions or concerns.  PMT will continue to support hoslitstically  Wadie Lessen NP  Palliative Medicine Team Team Phone # [phone] Pager [phone]   1

## 2014-07-26 ENCOUNTER — Ambulatory Visit: Payer: Medicare Other

## 2014-07-26 VITALS — BP 92/50 | HR 58 | Temp 98.6°F | Resp 18

## 2014-07-26 DIAGNOSIS — E860 Dehydration: Secondary | ICD-10-CM

## 2014-07-26 MED ORDER — SODIUM CHLORIDE 0.9 % IV SOLN
INTRAVENOUS | Status: DC
  Administered 2014-07-26: 09:00:00 via INTRAVENOUS

## 2014-07-26 NOTE — Patient Instructions (Addendum)

## 2014-07-26 NOTE — Progress Notes (Signed)
Patient states he feels fine. No dizziness upon standing, patient discharged home with cane in no acute distress.

## 2014-07-27 NOTE — Progress Notes (Deleted)
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 He had severe pain, dry mouth, dysgeusia, and poor PO intake, weakness, weight loss, and skin irritation.  Palliative care was consulted, and feeding tube was ordered.  Ultimately, he did not have a feeding tube placed due to the location of his bowel in front of his stomach.  In spite of fentanyl patches and improvement in pain control, patient had such poor taste that his PO intake remained poor. IV fluids were given on multiple occasions. He decided to stop RT prematurely after 29 fractions (total dose received 58 Gy to positive margins; 52.72 Gy to remaining at risk tissue.)  Plan: The patient has completed radiation treatment. The patient will return to radiation oncology clinic for routine followup within one week. I advised them to call or return sooner if they have any questions or concerns related to their recovery or treatment.  -----------------------------------  [name], MD

## 2014-07-28 ENCOUNTER — Other Ambulatory Visit: Payer: Self-pay

## 2014-07-28 ENCOUNTER — Telehealth: Payer: Self-pay | Admitting: *Deleted

## 2014-07-28 ENCOUNTER — Ambulatory Visit
Admission: RE | Admit: 2014-07-28 | Discharge: 2014-07-28 | Disposition: A | Discharge status: Home / Self Care | Payer: Medicare Other | Source: Ambulatory Visit | Attending: Radiation Oncology | Admitting: Radiation Oncology

## 2014-07-28 ENCOUNTER — Ambulatory Visit: Payer: Medicare Other

## 2014-07-28 ENCOUNTER — Ambulatory Visit: Payer: Medicare Other | Admitting: Nutrition

## 2014-07-28 ENCOUNTER — Encounter: Payer: Self-pay | Admitting: Radiation Oncology

## 2014-07-28 ENCOUNTER — Ambulatory Visit: Payer: Self-pay

## 2014-07-28 VITALS — BP 80/52 | HR 85 | Temp 97.6°F | Wt 169.3 lb

## 2014-07-28 VITALS — BP 107/72 | HR 62 | Temp 97.7°F | Resp 18

## 2014-07-28 DIAGNOSIS — C4441 Basal cell carcinoma of skin of scalp and neck: Secondary | ICD-10-CM | POA: Diagnosis not present

## 2014-07-28 DIAGNOSIS — C4440 Unspecified malignant neoplasm of skin of scalp and neck: Secondary | ICD-10-CM

## 2014-07-28 LAB — BASIC METABOLIC PANEL (CC13)
Anion Gap: 11 mEq/L (ref 3–11)
BUN: 15.9 mg/dL (ref 7.0–26.0)
CHLORIDE: 106 meq/L (ref 98–109)
CO2: 24 mEq/L (ref 22–29)
Calcium: 9.3 mg/dL (ref 8.4–10.4)
Creatinine: 1 mg/dL (ref 0.7–1.3)
EGFR: 69 mL/min/{1.73_m2} — ABNORMAL LOW (ref >90)
Glucose: 91 mg/dl (ref 70–140)
POTASSIUM: 3.8 meq/L (ref 3.5–5.1)
Sodium: 141 mEq/L (ref 136–145)

## 2014-07-28 LAB — ALBUMIN: Albumin: 3.8 g/dL (ref 3.5–5.2)

## 2014-07-28 LAB — PREALBUMIN: Prealbumin: 16.1 mg/dL — ABNORMAL LOW (ref 17.0–34.0)

## 2014-07-28 MED ORDER — SODIUM CHLORIDE 0.9 % IV SOLN
INTRAVENOUS | Status: DC
  Administered 2014-07-28: 09:00:00 via INTRAVENOUS

## 2014-07-28 MED ORDER — SODIUM CHLORIDE 0.9 % IV SOLN
1000.0000 mL | INTRAVENOUS | Status: DC
  Administered 2014-07-28: 10:00:00 via INTRAVENOUS

## 2014-07-28 NOTE — Patient Instructions (Signed)
Dehydration, Adult Dehydration is when you lose more fluids from the body than you take in. Vital organs like the kidneys, brain, and heart cannot function without a proper amount of fluids and salt. Any loss of fluids from the body can cause dehydration.  CAUSES   Vomiting.  Diarrhea.  Excessive sweating.  Excessive urine output.  Fever. SYMPTOMS  Mild dehydration  Thirst.  Dry lips.  Slightly dry mouth. Moderate dehydration  Very dry mouth.  Sunken eyes.  Skin does not bounce back quickly when lightly pinched and released.  Dark urine and decreased urine production.  Decreased tear production.  Headache. Severe dehydration  Very dry mouth.  Extreme thirst.  Rapid, weak pulse (more than 100 beats per minute at rest).  Cold hands and feet.  Not able to sweat in spite of heat and temperature.  Rapid breathing.  Blue lips.  Confusion and lethargy.  Difficulty being awakened.  Minimal urine production.  No tears. DIAGNOSIS  Your caregiver will diagnose dehydration based on your symptoms and your exam. Blood and urine tests will help confirm the diagnosis. The diagnostic evaluation should also identify the cause of dehydration. TREATMENT  Treatment of mild or moderate dehydration can often be done at home by increasing the amount of fluids that you drink. It is best to drink small amounts of fluid more often. Drinking too much at one time can make vomiting worse. Refer to the home care instructions below. Severe dehydration needs to be treated at the hospital where you will probably be given intravenous (IV) fluids that contain water and electrolytes. HOME CARE INSTRUCTIONS   Ask your caregiver about specific rehydration instructions.  Drink enough fluids to keep your urine clear or pale yellow.  Drink small amounts frequently if you have nausea and vomiting.  Eat as you normally do.  Avoid:  Foods or drinks high in sugar.  Carbonated  drinks.  Juice.  Extremely hot or cold fluids.  Drinks with caffeine.  Fatty, greasy foods.  Alcohol.  Tobacco.  Overeating.  Gelatin desserts.  Wash your hands well to avoid spreading bacteria and viruses.  Only take over-the-counter or prescription medicines for pain, discomfort, or fever as directed by your caregiver.  Ask your caregiver if you should continue all prescribed and over-the-counter medicines.  Keep all follow-up appointments with your caregiver. SEEK MEDICAL CARE IF:  You have abdominal pain and it increases or stays in one area (localizes).  You have a rash, stiff neck, or severe headache.  You are irritable, sleepy, or difficult to awaken.  You are weak, dizzy, or extremely thirsty. SEEK IMMEDIATE MEDICAL CARE IF:   You are unable to keep fluids down or you get worse despite treatment.  You have frequent episodes of vomiting or diarrhea.  You have blood or green matter (bile) in your vomit.  You have blood in your stool or your stool looks black and tarry.  You have not urinated in 6 to 8 hours, or you have only urinated a small amount of very dark urine.  You have a fever.  You faint. MAKE SURE YOU:   Understand these instructions.  Will watch your condition.  Will get help right away if you are not doing well or get worse. Document Released: 05/23/2005 Document Revised: 08/15/2011 Document Reviewed: 01/10/2011 ExitCare Patient Information 2015 [employer]. This information is not intended to replace advice given to you by your health care provider. Make sure you discuss any questions you have with your health care   provider.  

## 2014-07-28 NOTE — Telephone Encounter (Signed)
Patient has not shown for 8 am labs. Left voice mail requesting call back; left this caller's name and direct number.  8:48 am Patient has arrived in med onc infusion for IVF. Left vm for Sharyn Lull, scheduler to insure pt has BMET lab drawn prior to infusion of IVF. Left this caller's name and direct number.

## 2014-07-28 NOTE — Telephone Encounter (Signed)
LVM on home phone that patient has 0800 follow-up appt with Dr. Isidore Moos this Wednesday.  Asked that he call with any questions.  Gayleen Orem, RN, BSN, [place] [facility] [place] [phone]

## 2014-07-28 NOTE — Progress Notes (Signed)
Wadie Lessen, NP palliative care at chairside. Patient informing her that "I feel pretty good" and was able to eat 2 chick fil a nuggets and "very little" amount of fish that his wife cooked over the weekend. Patient here for IVF. Awaiting orders.   0915 patient taken to rad onc with Gayleen Orem RN navigator, Wadie Lessen NP, and wife to have MD visit. No IVF orders as of this time. Labs pending.

## 2014-07-28 NOTE — Telephone Encounter (Signed)
LVM on patient's mobile indicating he has an appt with Dr. Isidore Moos this Wed at 0800.  Gayleen Orem, RN, BSN, [facility] [place] [phone]

## 2014-07-28 NOTE — Telephone Encounter (Signed)
Per [name] I have scheduled appts for this week. He will notify the patient

## 2014-07-28 NOTE — Progress Notes (Signed)
Mr. Mccaughey presents today for follow-up with Dr. Valere Dross.  He has had 29/33 tmts, chose to cancel remaining tmts.  He reports that his throat and mouth "are much better".  No obvious mouth sores under examination.  Wt. 169.3 lbs today, ortho stasis noted.  He reports he has been drinking 2 Breeze daily, 2 Ensure daily, sipping water frequently.   Walking 1/4 mile outside daily without fatigue or SOB.  Denies throat pain.  Sleeping well at HS.    Urinating 4 times daily, minimal stool daily.

## 2014-07-28 NOTE — Progress Notes (Signed)
CC: Dr. [name]  Follow-up note:  Adrian West returns today for assessment after discontinuing his radiation therapy last week.  He is now able to take  Ensure and Breeze diet supplements by mouth along with some fluids.  He is having less throat and mouth discomfort.  He is scheduled for IV fluids this morning.  His electrolytes along with his BUN/creatinine ratio looks satisfactory.  His weight remains stable.  He was unable to have a PEG tube placed last Friday because of his anatomy.  Physical examination: Wt Readings from Last 3 Encounters:  07/28/14 169 lb 4.8 oz (76.794 kg)  07/25/14 170 lb (77.111 kg)  07/21/14 169 lb 9.6 oz (76.93 kg)   Temp Readings from Last 3 Encounters:  07/28/14 97.6 F (36.4 C)   07/28/14 97.7 F (36.5 C) Oral  07/26/14 98.6 F (37 C) Oral   BP Readings from Last 3 Encounters:  07/28/14 80/52  07/28/14 97/58  07/26/14 92/50   Pulse Readings from Last 3 Encounters:  07/28/14 85  07/28/14 77  07/26/14 58   He appears well.  Oral cavity and oropharynx is remarkable for severe xerostomia with no evidence for candidiasis.  There is minimal residual mucositis.  Laboratory data: Lab Results  Component Value Date   WBC 4.8 07/25/2014   HGB 13.8 07/25/2014   HCT 42.1 07/25/2014   MCV 83.5 07/25/2014   PLT 188 07/25/2014   CMP     Component Value Date/Time   NA 141 07/28/2014 0854   NA 139 07/25/2014 1142   K 3.8 07/28/2014 0854   K 3.9 07/25/2014 1142   CL 104 07/25/2014 1142   CO2 24 07/28/2014 0854   CO2 26 07/25/2014 1142   GLUCOSE 91 07/28/2014 0854   GLUCOSE 92 07/25/2014 1142   BUN 15.9 07/28/2014 0854   BUN 11 07/25/2014 1142   CREATININE 1.0 07/28/2014 0854   CREATININE 0.99 07/25/2014 1142   CREATININE 1.20 10/12/2010 1257   CALCIUM 9.3 07/28/2014 0854   CALCIUM 9.3 07/25/2014 1142   PROT 5.6* 10/22/2013 1218   ALBUMIN 3.0* 10/22/2013 1218   AST 21 10/22/2013 1218   ALT 23 10/22/2013 1218   ALKPHOS 60 10/22/2013 1218   BILITOT 0.5 10/22/2013 1218   GFRNONAA 73* 07/25/2014 1142   GFRAA 85* 07/25/2014 1142   Impression: His mucositis is much improved.  He is now able to take in by mouth diet supplements and fluid, but I still think he needs IV fluids today because of his mild orthostasis.  I do not feel that he needs a gastrostomy.  We encouraged him to improve his by mouth nutrition and fluid intake.  He will be given 1 L of normal saline IV today over 4 hours.  Plan: Follow-up visit with Dr. Isidore Moos this Wednesday to determine whether not he needs any more IV fluids.  I understand that a referral has been placed to  Hospice.

## 2014-07-28 NOTE — Progress Notes (Signed)
Patient reports he has "turned the corner."  Reports he was unable to have feeding tube placed over the weekend.  Patient has increased oral intake and reports drinking 2 boost breeze and to ensure plus daily.  Patient even tried several soft protein foods.  Weight is generally stable at this time.  Provided support and encouragement for patient to continue increased oral intake of oral nutrition supplements with oral intake of soft foods as tolerated.  Will continue to work with patient as needed.  **Disclaimer: This note was dictated with voice recognition software. Similar sounding words can inadvertently be transcribed and this note may contain transcription errors which may not have been corrected upon publication of note.**

## 2014-07-29 ENCOUNTER — Ambulatory Visit (HOSPITAL_COMMUNITY): Payer: Medicare Other

## 2014-07-29 ENCOUNTER — Telehealth: Payer: Self-pay | Admitting: *Deleted

## 2014-07-29 ENCOUNTER — Encounter: Payer: Self-pay | Admitting: *Deleted

## 2014-07-29 ENCOUNTER — Other Ambulatory Visit (HOSPITAL_COMMUNITY): Payer: Self-pay

## 2014-07-29 NOTE — Telephone Encounter (Signed)
LVM on patient's mobile, reminding him of tomorrow's 0800 appt with Dr. Isidore Moos and 0830 infusion.  I requested he return my call to confirm his understanding.

## 2014-07-30 ENCOUNTER — Encounter: Payer: Self-pay | Admitting: *Deleted

## 2014-07-30 ENCOUNTER — Ambulatory Visit
Admission: RE | Admit: 2014-07-30 | Discharge: 2014-07-30 | Disposition: A | Discharge status: Home / Self Care | Payer: Medicare Other | Source: Ambulatory Visit | Attending: Radiation Oncology | Admitting: Radiation Oncology

## 2014-07-30 ENCOUNTER — Ambulatory Visit: Payer: Medicare Other

## 2014-07-30 VITALS — BP 96/53 | HR 87 | Temp 97.4°F | Resp 20 | Wt 168.7 lb

## 2014-07-30 VITALS — BP 111/47 | HR 57 | Temp 97.0°F | Resp 20

## 2014-07-30 DIAGNOSIS — Z7982 Long term (current) use of aspirin: Secondary | ICD-10-CM | POA: Diagnosis not present

## 2014-07-30 DIAGNOSIS — K5900 Constipation, unspecified: Secondary | ICD-10-CM | POA: Diagnosis not present

## 2014-07-30 DIAGNOSIS — R1310 Dysphagia, unspecified: Secondary | ICD-10-CM | POA: Diagnosis not present

## 2014-07-30 DIAGNOSIS — E039 Hypothyroidism, unspecified: Secondary | ICD-10-CM | POA: Diagnosis not present

## 2014-07-30 DIAGNOSIS — Z510 Encounter for antineoplastic radiation therapy: Secondary | ICD-10-CM | POA: Diagnosis not present

## 2014-07-30 DIAGNOSIS — C7989 Secondary malignant neoplasm of other specified sites: Secondary | ICD-10-CM | POA: Diagnosis not present

## 2014-07-30 DIAGNOSIS — G510 Bell's palsy: Secondary | ICD-10-CM | POA: Diagnosis not present

## 2014-07-30 DIAGNOSIS — C4440 Unspecified malignant neoplasm of skin of scalp and neck: Secondary | ICD-10-CM

## 2014-07-30 DIAGNOSIS — Z9049 Acquired absence of other specified parts of digestive tract: Secondary | ICD-10-CM | POA: Diagnosis not present

## 2014-07-30 DIAGNOSIS — Z79899 Other long term (current) drug therapy: Secondary | ICD-10-CM | POA: Diagnosis not present

## 2014-07-30 DIAGNOSIS — R070 Pain in throat: Secondary | ICD-10-CM | POA: Diagnosis not present

## 2014-07-30 DIAGNOSIS — I10 Essential (primary) hypertension: Secondary | ICD-10-CM | POA: Diagnosis not present

## 2014-07-30 DIAGNOSIS — R5383 Other fatigue: Secondary | ICD-10-CM | POA: Diagnosis not present

## 2014-07-30 DIAGNOSIS — Z6826 Body mass index (BMI) 26.0-26.9, adult: Secondary | ICD-10-CM | POA: Diagnosis not present

## 2014-07-30 DIAGNOSIS — Z791 Long term (current) use of non-steroidal anti-inflammatories (NSAID): Secondary | ICD-10-CM | POA: Diagnosis not present

## 2014-07-30 DIAGNOSIS — E785 Hyperlipidemia, unspecified: Secondary | ICD-10-CM | POA: Diagnosis not present

## 2014-07-30 DIAGNOSIS — L599 Disorder of the skin and subcutaneous tissue related to radiation, unspecified: Secondary | ICD-10-CM | POA: Diagnosis not present

## 2014-07-30 DIAGNOSIS — Z85828 Personal history of other malignant neoplasm of skin: Secondary | ICD-10-CM | POA: Diagnosis not present

## 2014-07-30 DIAGNOSIS — Z952 Presence of prosthetic heart valve: Secondary | ICD-10-CM | POA: Diagnosis not present

## 2014-07-30 DIAGNOSIS — K117 Disturbances of salivary secretion: Secondary | ICD-10-CM | POA: Diagnosis not present

## 2014-07-30 DIAGNOSIS — K1233 Oral mucositis (ulcerative) due to radiation: Secondary | ICD-10-CM | POA: Diagnosis not present

## 2014-07-30 DIAGNOSIS — T66XXXS Radiation sickness, unspecified, sequela: Secondary | ICD-10-CM | POA: Diagnosis not present

## 2014-07-30 DIAGNOSIS — R634 Abnormal weight loss: Secondary | ICD-10-CM | POA: Diagnosis not present

## 2014-07-30 DIAGNOSIS — I951 Orthostatic hypotension: Secondary | ICD-10-CM | POA: Diagnosis not present

## 2014-07-30 DIAGNOSIS — C4442 Squamous cell carcinoma of skin of scalp and neck: Secondary | ICD-10-CM | POA: Diagnosis not present

## 2014-07-30 DIAGNOSIS — K219 Gastro-esophageal reflux disease without esophagitis: Secondary | ICD-10-CM | POA: Diagnosis not present

## 2014-07-30 DIAGNOSIS — C4441 Basal cell carcinoma of skin of scalp and neck: Secondary | ICD-10-CM | POA: Insufficient documentation

## 2014-07-30 MED ORDER — POTASSIUM CHLORIDE 2 MEQ/ML IV SOLN: INTRAVENOUS | Status: DC

## 2014-07-30 MED ORDER — BIAFINE EX EMUL
Freq: Two times a day (BID) | CUTANEOUS | Status: DC
  Administered 2014-07-30: 10:00:00 via TOPICAL

## 2014-07-30 MED ORDER — SODIUM CHLORIDE 0.9 % IV SOLN
INTRAVENOUS | Status: AC
  Administered 2014-07-30: 10:00:00 via INTRAVENOUS
  Filled 2014-07-30: qty 1000

## 2014-07-30 MED ORDER — SILVER SULFADIAZINE 1 % EX CREA
TOPICAL_CREAM | Freq: Two times a day (BID) | CUTANEOUS | Status: DC
  Administered 2014-07-30: 10:00:00 via TOPICAL

## 2014-07-30 MED ORDER — SODIUM CHLORIDE 0.9 % IV SOLN: INTRAVENOUS | Status: DC

## 2014-07-30 NOTE — Progress Notes (Signed)
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     He stopped radiotherapy prematurely at 60 Gy due to acute side effects and interest in enrolling on hospice.  His wife spoke w/ a Acupuncturist and found out that he does not qualify.  He did not get a feeding tube due to the location of his colon in relationship to his stomach.  All of this developed during my absence at a national H+N symposium; he was cared for by my partner and Palliative care.  He is doing better, now.  His acute side effects are regressing.  Patient states his mouth pain is "moderate". He is wearing Fentanyl patch 37.5 mg, takes Hydrocodone approximately once  A day for pain control. He is using Biotene, ACT mouth rinse.  He is drinking Breeze Fruit drinks 4 cans daily, Ensure 1-2 cans daily, water. He has tried a few soft foods but states he has metallic taste in mouth. Patient's skin in treatment area healing. He is applying Neosporin and Silvadene BID behind right ear for moist desquamation, gave him nonadherent pads to place behind ear to protect from glasses. BP low but not orthostatic. Pt for IVF today. He states he has not been taking most of his routine medications due to difficulty swallowing. He states he plans to begin them today.                  ALLERGIES:  has No Known Allergies.  Meds: Current Outpatient Prescriptions  Medication Sig Dispense Refill  . acetaminophen (TYLENOL) 500 MG tablet Take 500 mg by mouth every 6 (six) hours as needed (As needed for pain).    .  bisacodyl (DULCOLAX) 5 MG EC tablet Take 5 mg by mouth daily as needed for moderate constipation. 10 mg po orally, as needed    . erythromycin ophthalmic ointment 1 application 2 (two) times daily. Place a thin strip into the right eye twice daily    . FentaNYL 37.5 MCG/HR PT72 Place 37.5 mcg/hr onto the skin every 3 (three) days. 5 patch 0  . HYDROcodone-acetaminophen (NORCO) 7.5-325 MG per tablet Take 1 tablet by mouth every 4 (four) hours as needed for moderate pain. 60 tablet 0  . Hypromellose (GENTEAL MILD OP) Apply 1 application to eye as needed (both eyes).    Marland Kitchen ibuprofen (ADVIL,MOTRIN) 600 MG tablet Take 600 mg by mouth 3 (three) times daily.    Marland Kitchen lidocaine (XYLOCAINE) 2 % solution Patient: Mix 1part 2% viscous lidocaine, 1part H20. Swish and/or swallow 40mL of this mixture, 62min before meals and at bedtime, up to QID, for soreness    . mineral oil enema Place 133 mLs (1 enema total) rectally once. For severe constipation. 133 mL 5  . polyvinyl alcohol (LIQUIFILM TEARS) 1.4 % ophthalmic solution 2 drops at bedtime.    . senna (SENOKOT) 8.6 MG TABS tablet Take 2 tablets (17.2 mg total) by mouth 2 (two) times daily as needed for mild constipation. To prevent constipation. 30 each 2  . silver sulfADIAZINE (SILVADENE) 1 % cream Apply  1 application topically 2 (two) times daily.    . sodium fluoride (FLUORISHIELD) 1.1 % GEL dental gel Instill one drop of gel per tooth space of fluoride tray. Place over teeth for 5 minutes. Remove. Spit out excess. Repeat nightly. 120 mL prn  . aspirin EC 81 MG tablet Take 81 mg by mouth daily.    Marland Kitchen atorvastatin (LIPITOR) 10 MG tablet TAKE ONE TABLET EACH DAY (Patient not taking: Reported on 07/30/2014) 30 tablet 0  . clopidogrel (PLAVIX) 75 MG tablet TAKE ONE TABLET BY MOUTH ONCE DAILY (Patient not taking: Reported on 07/30/2014) 30 tablet 1  . levothyroxine (SYNTHROID, LEVOTHROID) 150 MCG tablet Take 150 mcg by mouth daily before breakfast.    . metoprolol  succinate (TOPROL-XL) 25 MG 24 hr tablet Take 25 mg by mouth daily.    . Multiple Vitamin (MULTIVITAMIN WITH MINERALS) TABS tablet Take 1 tablet by mouth daily.    Marland Kitchen omeprazole (PRILOSEC) 40 MG capsule Take 40 mg by mouth daily.    . tamsulosin (FLOMAX) 0.4 MG CAPS capsule Take 0.4 mg by mouth.     No current facility-administered medications for this encounter.    Physical Findings: The patient is in no acute distress. Patient is alert and oriented. Wt Readings from Last 3 Encounters:  07/28/14 169 lb 4.8 oz (76.794 kg)  07/25/14 170 lb (77.111 kg)  07/21/14 169 lb 9.6 oz (76.93 kg)    weight is 168 lb 11.2 oz (76.522 kg). His temperature is 97.4 F (36.3 C). His blood pressure is 96/53 and his pulse is 87. His respiration is 20. Marland Kitchen  Oropharynx - resolving mucositis, dry mucosa, no thrush.  Skin healing well with some persistent but improved moist desquamation behind right ear.  Lungs CTAB, heart RRR, no peripheral edema in ankles.  Lab Findings: Lab Results  Component Value Date   WBC 4.8 07/25/2014   HGB 13.8 07/25/2014   HCT 42.1 07/25/2014   MCV 83.5 07/25/2014   PLT 188 07/25/2014   CMP Latest Ref Rng 07/28/2014 07/25/2014 07/17/2014  Glucose 70 - 140 mg/dl 91 92 96  BUN 7.0 - 26.0 mg/dL 15.9 11 18.5  Creatinine 0.7 - 1.3 mg/dL 1.0 0.99 0.9  Sodium 136 - 145 mEq/L 141 139 142  Potassium 3.5 - 5.1 mEq/L 3.8 3.9 3.8  Chloride 96 - 112 mmol/L - 104 -  CO2 22 - 29 mEq/L 24 26 26   Calcium 8.4 - 10.4 mg/dL 9.3 9.3 9.4  Total Protein 6.0 - 8.3 g/dL - - -  Total Bilirubin 0.2 - 1.2 mg/dL - - -  Alkaline Phos 39 - 117 U/L - - -  AST 0 - 37 U/L - - -  ALT 0 - 53 U/L - - -    Lab Results  Component Value Date   TSH 4.59* 02/13/2014    Radiographic Findings: Dg Abd 1 View  07/25/2014   CLINICAL DATA:  Evaluate for retained barium in the bowel prior to gastrostomy tube placement  EXAM: ABDOMEN - 1 VIEW  COMPARISON:  CT scan of the abdomen dated January 10, 2014.  FINDINGS: There  is contrast present throughout the colon. Exuberant diverticulosis is present in the ascending, descending, and rectosigmoid portions of the colon. No small bowel or stomach barium is present. There is multilevel degenerative disc disease of the lumbar spine with thoracolumbar curvature convex towards the right. There is curvilinear calcification which corresponds to calcification within a lower pole cyst in the left kidney on the previous  CT scan.  IMPRESSION: There is retained barium within the colon and evidence of exuberant diverticulosis.   Electronically Signed   By: David  Martinique   On: 07/25/2014 16:32   Dg Op Swallowing Func-medicare/speech Path  07/22/2014   CLINICAL DATA: dysphagia   FLUOROSCOPY FOR SWALLOWING FUNCTION STUDY:  Fluoroscopy was provided for swallowing function study, which was  administered by a speech pathologist.  Final results and recommendations  from this study are contained within the speech pathology report.    Ir Fluoro Rm 30-60 Min  07/25/2014   CLINICAL DATA:  HEAD AND NECK CANCER, STATUS POST RADIATION, DYSPHAGIA, MALNUTRITION  EXAM: IR FLOURO RM 0-60 MIN  COMPARISON:  07/25/2014  FINDINGS: Oral gastric catheter was advanced into the stomach. Stomach was insufflated with air. Fluoroscopic exam of the abdomen performed. This reveals a high position transverse colon which is anterior to the stomach fundus. Percutaneous puncture below the transverse colon into the stomach would risk vascular injury within the mesocolon. Therefore percutaneous gastrostomy cannot be performed in this fashion. Recommend surgical evaluation for gastrostomy placement.  IMPRESSION: High positioned transverse colon in relation to the stomach. percutaneous fluoroscopic tube placement would traverse the transverse mesocolon . See above comment. This was discussed at length with the family.   Electronically Signed   By: Jerilynn Mages.  Shick M.D.   On: 07/25/2014 15:57    Impression/Plan:    1) Head and Neck  Cancer Status:  Healing from RT  2) Nutritional Status: - weight: stabilizing - PEG tube: none  3) Risk Factors: patient knows to use good "sun hygiene"   4) Swallowing: has been seen by SLP and advised to continue with current diet as tolerated  5) Dental: Encouraged to continue regular followup with dentistry, and dental hygiene including f/u on 3-23 with Dr [name]  6) Thyroid function: preexisting condition of hypothyroidism   - on medication Lab Results  Component Value Date   TSH 4.59* 02/13/2014    7) Social: No active social issues to address at this time   8) Other: PT recommended; Rx written to [facility] for this today PO intake improved but patient feels better with IVF and is still hypotensive.  Continue IVF QOD until next f/u. Silvadene and Biafine refilled today for skin  9) Follow-up in 5 days.. The patient was encouraged to call with any issues or questions before then.  _____________________________________   [name], MD

## 2014-07-30 NOTE — Patient Instructions (Signed)
Dehydration, Adult Dehydration is when you lose more fluids from the body than you take in. Vital organs like the kidneys, brain, and heart cannot function without a proper amount of fluids and salt. Any loss of fluids from the body can cause dehydration.  CAUSES   Vomiting.  Diarrhea.  Excessive sweating.  Excessive urine output.  Fever. SYMPTOMS  Mild dehydration  Thirst.  Dry lips.  Slightly dry mouth. Moderate dehydration  Very dry mouth.  Sunken eyes.  Skin does not bounce back quickly when lightly pinched and released.  Dark urine and decreased urine production.  Decreased tear production.  Headache. Severe dehydration  Very dry mouth.  Extreme thirst.  Rapid, weak pulse (more than 100 beats per minute at rest).  Cold hands and feet.  Not able to sweat in spite of heat and temperature.  Rapid breathing.  Blue lips.  Confusion and lethargy.  Difficulty being awakened.  Minimal urine production.  No tears. DIAGNOSIS  Your caregiver will diagnose dehydration based on your symptoms and your exam. Blood and urine tests will help confirm the diagnosis. The diagnostic evaluation should also identify the cause of dehydration. TREATMENT  Treatment of mild or moderate dehydration can often be done at home by increasing the amount of fluids that you drink. It is best to drink small amounts of fluid more often. Drinking too much at one time can make vomiting worse. Refer to the home care instructions below. Severe dehydration needs to be treated at the hospital where you will probably be given intravenous (IV) fluids that contain water and electrolytes. HOME CARE INSTRUCTIONS   Ask your caregiver about specific rehydration instructions.  Drink enough fluids to keep your urine clear or pale yellow.  Drink small amounts frequently if you have nausea and vomiting.  Eat as you normally do.  Avoid:  Foods or drinks high in sugar.  Carbonated  drinks.  Juice.  Extremely hot or cold fluids.  Drinks with caffeine.  Fatty, greasy foods.  Alcohol.  Tobacco.  Overeating.  Gelatin desserts.  Wash your hands well to avoid spreading bacteria and viruses.  Only take over-the-counter or prescription medicines for pain, discomfort, or fever as directed by your caregiver.  Ask your caregiver if you should continue all prescribed and over-the-counter medicines.  Keep all follow-up appointments with your caregiver. SEEK MEDICAL CARE IF:  You have abdominal pain and it increases or stays in one area (localizes).  You have a rash, stiff neck, or severe headache.  You are irritable, sleepy, or difficult to awaken.  You are weak, dizzy, or extremely thirsty. SEEK IMMEDIATE MEDICAL CARE IF:   You are unable to keep fluids down or you get worse despite treatment.  You have frequent episodes of vomiting or diarrhea.  You have blood or green matter (bile) in your vomit.  You have blood in your stool or your stool looks black and tarry.  You have not urinated in 6 to 8 hours, or you have only urinated a small amount of very dark urine.  You have a fever.  You faint. MAKE SURE YOU:   Understand these instructions.  Will watch your condition.  Will get help right away if you are not doing well or get worse. Document Released: 05/23/2005 Document Revised: 08/15/2011 Document Reviewed: 01/10/2011 ExitCare Patient Information 2015 [employer]. This information is not intended to replace advice given to you by your health care provider. Make sure you discuss any questions you have with your health care   provider.  

## 2014-07-30 NOTE — Progress Notes (Addendum)
Patient states his mouth pain is "moderate". He is wearing Fentanyl patch 37.5 mg, takes Hydrocodone approximately once  A day for p[ain control. He is using Biotene, ACT mouth rinse. No signs of thrush on end of tongue; difficult to visualize entire tongue, mucositis appears to have resolved.  He is drinking Breeze Fruit drinks 4 cans daily, Ensure 1-2 cans daily, water. He has tried a few soft foods but states he has metallic taste in mouth. Patient's skin in treatment area healing. He is applying Neosporin and Silvadene BID behind right ear for moist desquamation, gave him nonadherent pads to place behind ear to protect from glasses. BP low but not orthostatic. Pt for IVF today. He states he has not been taking most of his routine medications due to difficulty swallowing. He states he plans to begin them today.

## 2014-07-31 ENCOUNTER — Telehealth: Payer: Self-pay | Admitting: *Deleted

## 2014-07-31 ENCOUNTER — Ambulatory Visit: Payer: Medicare Other

## 2014-07-31 NOTE — Progress Notes (Addendum)
To provide support and encouragement, care continuity and to assess for needs, met with patient during f/u appt with Dr. Isidore Moos.  1. He reported:  Minimal throat pain, continued use of Fentanyl patch.  Improving nutrition/hydration.  Strong desire to avoid PEG to which end he will continue to increase oral intake. 2. He understands he will receive IVF today, Friday and Monday. 3. Patient wife expressed desire to have assistance with his daily exercise and PT to increase his stamina.  She is going to investigate options available at [facility]. 4. I provide Biafine and Silvadene (RN notified), several Telfa non-adherent pads for his R ear. Patient understands I can be contacted with questions or concerns.  Gayleen Orem, RN, BSN, [place] at [place] [phone]

## 2014-07-31 NOTE — Telephone Encounter (Signed)
Thank you Usama Harkless

## 2014-07-31 NOTE — Telephone Encounter (Signed)
CALLED PATIENT TO INFORM OF STAT LAB FOR 08-05-14 @ 3:30 PM, LVM FOR A RETURN CALL

## 2014-07-31 NOTE — Telephone Encounter (Signed)
Spoke with patient.  Confirmed with him appts for tomorrow and Monday's IVF, and follow-up with Dr. Isidore Moos next Tuesday.  Gayleen Orem, RN, BSN, [place] at [place] [phone]

## 2014-07-31 NOTE — Telephone Encounter (Signed)
Per staff message from [place] the navigator, I have scheduled apapts. He will notified the patient

## 2014-08-01 ENCOUNTER — Ambulatory Visit: Payer: Medicare Other

## 2014-08-01 VITALS — BP 109/64 | HR 65 | Temp 97.9°F | Resp 22

## 2014-08-01 DIAGNOSIS — C4440 Unspecified malignant neoplasm of skin of scalp and neck: Secondary | ICD-10-CM

## 2014-08-01 MED ORDER — SODIUM CHLORIDE 0.9 % IV SOLN
INTRAVENOUS | Status: AC
  Administered 2014-08-01: 10:00:00 via INTRAVENOUS
  Filled 2014-08-01: qty 1000

## 2014-08-01 NOTE — Patient Instructions (Signed)
Dehydration, Adult Dehydration is when you lose more fluids from the body than you take in. Vital organs like the kidneys, brain, and heart cannot function without a proper amount of fluids and salt. Any loss of fluids from the body can cause dehydration.  CAUSES   Vomiting.  Diarrhea.  Excessive sweating.  Excessive urine output.  Fever. SYMPTOMS  Mild dehydration  Thirst.  Dry lips.  Slightly dry mouth. Moderate dehydration  Very dry mouth.  Sunken eyes.  Skin does not bounce back quickly when lightly pinched and released.  Dark urine and decreased urine production.  Decreased tear production.  Headache. Severe dehydration  Very dry mouth.  Extreme thirst.  Rapid, weak pulse (more than 100 beats per minute at rest).  Cold hands and feet.  Not able to sweat in spite of heat and temperature.  Rapid breathing.  Blue lips.  Confusion and lethargy.  Difficulty being awakened.  Minimal urine production.  No tears. DIAGNOSIS  Your caregiver will diagnose dehydration based on your symptoms and your exam. Blood and urine tests will help confirm the diagnosis. The diagnostic evaluation should also identify the cause of dehydration. TREATMENT  Treatment of mild or moderate dehydration can often be done at home by increasing the amount of fluids that you drink. It is best to drink small amounts of fluid more often. Drinking too much at one time can make vomiting worse. Refer to the home care instructions below. Severe dehydration needs to be treated at the hospital where you will probably be given intravenous (IV) fluids that contain water and electrolytes. HOME CARE INSTRUCTIONS   Ask your caregiver about specific rehydration instructions.  Drink enough fluids to keep your urine clear or pale yellow.  Drink small amounts frequently if you have nausea and vomiting.  Eat as you normally do.  Avoid:  Foods or drinks high in sugar.  Carbonated  drinks.  Juice.  Extremely hot or cold fluids.  Drinks with caffeine.  Fatty, greasy foods.  Alcohol.  Tobacco.  Overeating.  Gelatin desserts.  Wash your hands well to avoid spreading bacteria and viruses.  Only take over-the-counter or prescription medicines for pain, discomfort, or fever as directed by your caregiver.  Ask your caregiver if you should continue all prescribed and over-the-counter medicines.  Keep all follow-up appointments with your caregiver. SEEK MEDICAL CARE IF:  You have abdominal pain and it increases or stays in one area (localizes).  You have a rash, stiff neck, or severe headache.  You are irritable, sleepy, or difficult to awaken.  You are weak, dizzy, or extremely thirsty. SEEK IMMEDIATE MEDICAL CARE IF:   You are unable to keep fluids down or you get worse despite treatment.  You have frequent episodes of vomiting or diarrhea.  You have blood or green matter (bile) in your vomit.  You have blood in your stool or your stool looks black and tarry.  You have not urinated in 6 to 8 hours, or you have only urinated a small amount of very dark urine.  You have a fever.  You faint. MAKE SURE YOU:   Understand these instructions.  Will watch your condition.  Will get help right away if you are not doing well or get worse. Document Released: 05/23/2005 Document Revised: 08/15/2011 Document Reviewed: 01/10/2011 ExitCare Patient Information 2015 [employer]. This information is not intended to replace advice given to you by your health care provider. Make sure you discuss any questions you have with your health care   provider. Hypokalemia Hypokalemia means that the amount of potassium in the blood is lower than normal.Potassium is a chemical, called an electrolyte, that helps regulate the amount of fluid in the body. It also stimulates muscle contraction and helps nerves function properly.Most of the body's potassium is inside of  cells, and only a very small amount is in the blood. Because the amount in the blood is so small, minor changes can be life-threatening. CAUSES  Antibiotics.  Diarrhea or vomiting.  Using laxatives too much, which can cause diarrhea.  Chronic kidney disease.  Water pills (diuretics).  Eating disorders (bulimia).  Low magnesium level.  Sweating a lot. SIGNS AND SYMPTOMS  Weakness.  Constipation.  Fatigue.  Muscle cramps.  Mental confusion.  Skipped heartbeats or irregular heartbeat (palpitations).  Tingling or numbness. DIAGNOSIS  Your health care provider can diagnose hypokalemia with blood tests. In addition to checking your potassium level, your health care provider may also check other lab tests. TREATMENT Hypokalemia can be treated with potassium supplements taken by mouth or adjustments in your current medicines. If your potassium level is very low, you may need to get potassium through a vein (IV) and be monitored in the hospital. A diet high in potassium is also helpful. Foods high in potassium are:  Nuts, such as peanuts and pistachios.  Seeds, such as sunflower seeds and pumpkin seeds.  Peas, lentils, and lima beans.  Whole grain and bran cereals and breads.  Fresh fruit and vegetables, such as apricots, avocado, bananas, cantaloupe, kiwi, oranges, tomatoes, asparagus, and potatoes.  Orange and tomato juices.  Red meats.  Fruit yogurt. HOME CARE INSTRUCTIONS  Take all medicines as prescribed by your health care provider.  Maintain a healthy diet by including nutritious food, such as fruits, vegetables, nuts, whole grains, and lean meats.  If you are taking a laxative, be sure to follow the directions on the label. SEEK MEDICAL CARE IF:  Your weakness gets worse.  You feel your heart pounding or racing.  You are vomiting or having diarrhea.  You are diabetic and having trouble keeping your blood glucose in the normal range. SEEK IMMEDIATE  MEDICAL CARE IF:  You have chest pain, shortness of breath, or dizziness.  You are vomiting or having diarrhea for more than 2 days.  You faint. MAKE SURE YOU:   Understand these instructions.  Will watch your condition.  Will get help right away if you are not doing well or get worse. Document Released: 05/23/2005 Document Revised: 03/13/2013 Document Reviewed: 11/23/2012 ExitCare Patient Information 2015 [employer]. This information is not intended to replace advice given to you by your health care provider. Make sure you discuss any questions you have with your health care provider.  

## 2014-08-04 ENCOUNTER — Ambulatory Visit: Payer: Medicare Other

## 2014-08-04 VITALS — BP 126/63 | HR 53 | Temp 97.5°F | Resp 18

## 2014-08-04 DIAGNOSIS — C4440 Unspecified malignant neoplasm of skin of scalp and neck: Secondary | ICD-10-CM

## 2014-08-04 MED ORDER — SODIUM CHLORIDE 0.9 % IV SOLN
INTRAVENOUS | Status: AC
  Administered 2014-08-04: 09:00:00 via INTRAVENOUS
  Filled 2014-08-04: qty 1000

## 2014-08-04 NOTE — Patient Instructions (Signed)

## 2014-08-05 ENCOUNTER — Ambulatory Visit
Admission: RE | Admit: 2014-08-05 | Discharge: 2014-08-05 | Disposition: A | Discharge status: Home / Self Care | Payer: Medicare Other | Source: Ambulatory Visit | Attending: Radiation Oncology | Admitting: Radiation Oncology

## 2014-08-05 VITALS — BP 107/63 | HR 91 | Temp 97.9°F | Resp 20 | Wt 164.1 lb

## 2014-08-05 DIAGNOSIS — C4440 Unspecified malignant neoplasm of skin of scalp and neck: Secondary | ICD-10-CM

## 2014-08-05 LAB — BASIC METABOLIC PANEL (CC13)
Anion Gap: 10 mEq/L (ref 3–11)
BUN: 12 mg/dL (ref 7.0–26.0)
CO2: 27 mEq/L (ref 22–29)
CREATININE: 0.9 mg/dL (ref 0.7–1.3)
Calcium: 9.1 mg/dL (ref 8.4–10.4)
Chloride: 105 mEq/L (ref 98–109)
EGFR: 80 mL/min/{1.73_m2} — ABNORMAL LOW (ref >90)
Glucose: 165 mg/dl — ABNORMAL HIGH (ref 70–140)
Potassium: 3.7 mEq/L (ref 3.5–5.1)
Sodium: 141 mEq/L (ref 136–145)

## 2014-08-05 MED ORDER — SODIUM CHLORIDE 0.9 % IV SOLN: INTRAVENOUS | Status: DC

## 2014-08-05 MED ORDER — SODIUM CHLORIDE 0.9 % IV SOLN
INTRAVENOUS | Status: DC
  Filled 2014-08-05: qty 1000

## 2014-08-05 MED ORDER — SODIUM CHLORIDE 0.9 % IV SOLN
INTRAVENOUS | Status: DC
Start: 2014-08-06 — End: 2014-08-05

## 2014-08-05 MED ORDER — SODIUM CHLORIDE 0.9 % IV SOLN
INTRAVENOUS | Status: DC
Start: 2014-08-08 — End: 2014-08-05

## 2014-08-05 NOTE — Progress Notes (Signed)
Patient denies pain today, states he stopped wearing Fentanyl patch 2 days ago when he ran out of them. He denies taking any pain medications in several days stating he has no throat/swallowing pain. He is drinking Breeze, Ensure nutritional drinks; has only had 2 today. He states he is drinking water at home, trying foods but foods do not taste good. He states "I am constantly trying foods." His wife is applying Silvadene behind right ear 1-2 times daily; this area is scabbing and healing. His skin in neck treatment area is healed.  Mouth, tongue without signs of thrush. He is fatigued, slept 14 hours last night. He does states he took Unisom 1/2 tablet last night. Orthostatic VS taken. Patient had stat BMP drawn today, results not ready.

## 2014-08-06 ENCOUNTER — Ambulatory Visit: Payer: Medicare Other

## 2014-08-06 ENCOUNTER — Other Ambulatory Visit: Payer: Self-pay | Admitting: *Deleted

## 2014-08-06 ENCOUNTER — Telehealth: Payer: Self-pay | Admitting: *Deleted

## 2014-08-06 DIAGNOSIS — D72820 Lymphocytosis (symptomatic): Secondary | ICD-10-CM

## 2014-08-06 MED ORDER — SODIUM CHLORIDE 0.9 % IV SOLN
Freq: Once | INTRAVENOUS | Status: AC
  Administered 2014-08-06: 09:00:00 via INTRAVENOUS
  Filled 2014-08-06: qty 1000

## 2014-08-06 NOTE — Patient Instructions (Signed)
Dehydration, Adult Dehydration is when you lose more fluids from the body than you take in. Vital organs like the kidneys, brain, and heart cannot function without a proper amount of fluids and salt. Any loss of fluids from the body can cause dehydration.  CAUSES   Vomiting.  Diarrhea.  Excessive sweating.  Excessive urine output.  Fever. SYMPTOMS  Mild dehydration  Thirst.  Dry lips.  Slightly dry mouth. Moderate dehydration  Very dry mouth.  Sunken eyes.  Skin does not bounce back quickly when lightly pinched and released.  Dark urine and decreased urine production.  Decreased tear production.  Headache. Severe dehydration  Very dry mouth.  Extreme thirst.  Rapid, weak pulse (more than 100 beats per minute at rest).  Cold hands and feet.  Not able to sweat in spite of heat and temperature.  Rapid breathing.  Blue lips.  Confusion and lethargy.  Difficulty being awakened.  Minimal urine production.  No tears. DIAGNOSIS  Your caregiver will diagnose dehydration based on your symptoms and your exam. Blood and urine tests will help confirm the diagnosis. The diagnostic evaluation should also identify the cause of dehydration. TREATMENT  Treatment of mild or moderate dehydration can often be done at home by increasing the amount of fluids that you drink. It is best to drink small amounts of fluid more often. Drinking too much at one time can make vomiting worse. Refer to the home care instructions below. Severe dehydration needs to be treated at the hospital where you will probably be given intravenous (IV) fluids that contain water and electrolytes. HOME CARE INSTRUCTIONS   Ask your caregiver about specific rehydration instructions.  Drink enough fluids to keep your urine clear or pale yellow.  Drink small amounts frequently if you have nausea and vomiting.  Eat as you normally do.  Avoid:  Foods or drinks high in sugar.  Carbonated  drinks.  Juice.  Extremely hot or cold fluids.  Drinks with caffeine.  Fatty, greasy foods.  Alcohol.  Tobacco.  Overeating.  Gelatin desserts.  Wash your hands well to avoid spreading bacteria and viruses.  Only take over-the-counter or prescription medicines for pain, discomfort, or fever as directed by your caregiver.  Ask your caregiver if you should continue all prescribed and over-the-counter medicines.  Keep all follow-up appointments with your caregiver. SEEK MEDICAL CARE IF:  You have abdominal pain and it increases or stays in one area (localizes).  You have a rash, stiff neck, or severe headache.  You are irritable, sleepy, or difficult to awaken.  You are weak, dizzy, or extremely thirsty. SEEK IMMEDIATE MEDICAL CARE IF:   You are unable to keep fluids down or you get worse despite treatment.  You have frequent episodes of vomiting or diarrhea.  You have blood or green matter (bile) in your vomit.  You have blood in your stool or your stool looks black and tarry.  You have not urinated in 6 to 8 hours, or you have only urinated a small amount of very dark urine.  You have a fever.  You faint. MAKE SURE YOU:   Understand these instructions.  Will watch your condition.  Will get help right away if you are not doing well or get worse. Document Released: 05/23/2005 Document Revised: 08/15/2011 Document Reviewed: 01/10/2011 ExitCare Patient Information 2015 [employer]. This information is not intended to replace advice given to you by your health care provider. Make sure you discuss any questions you have with your health care   provider.  

## 2014-08-06 NOTE — Progress Notes (Signed)
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  Patient denies pain today, states he stopped wearing Fentanyl patch 2 days ago when he ran out of them. He denies taking any pain medications in several days stating he has no throat/swallowing pain. He is drinking 2 Breeze, 1 Ensure nutritional drink/day.   He states he is drinking water at home, trying foods but foods do not taste good. He states "I am constantly trying foods." His wife is applying Silvadene behind right ear 1-2 times daily; this area is scabbing and healing. His skin in neck treatment area is healed. He is fatigued, slept 14 hours last night. He does states he took Unisom 1/2 tablet last night. Vitals are not orthostatic today.  ALLERGIES:  has No Known Allergies.  Meds: Current Outpatient Prescriptions  Medication Sig Dispense Refill  . acetaminophen (TYLENOL) 500 MG tablet Take 500 mg by mouth every 6 (six) hours as needed (As needed for pain).    Marland Kitchen aspirin EC 81 MG tablet Take 81 mg by mouth daily.    Marland Kitchen atorvastatin (LIPITOR) 10 MG tablet TAKE ONE TABLET EACH DAY 30 tablet 0  . bisacodyl (DULCOLAX) 5 MG EC tablet Take 5 mg by mouth daily as needed for moderate constipation. 10 mg po orally, as needed    . clopidogrel (PLAVIX) 75 MG tablet TAKE ONE TABLET BY MOUTH ONCE DAILY 30 tablet 1  . erythromycin ophthalmic ointment 1 application 2 (two) times daily. Place a thin strip into the right eye twice daily    . FentaNYL 37.5 MCG/HR PT72 Place 37.5 mcg/hr onto the skin every 3 (three) days. 5 patch 0    . Hypromellose (GENTEAL MILD OP) Apply 1 application to eye as needed (both eyes).    Marland Kitchen ibuprofen (ADVIL,MOTRIN) 600 MG tablet Take 600 mg by mouth 3 (three) times daily.    Marland Kitchen levothyroxine (SYNTHROID, LEVOTHROID) 150 MCG tablet Take 150 mcg by mouth daily before breakfast.    . lidocaine (XYLOCAINE) 2 % solution Patient: Mix 1part 2% viscous lidocaine, 1part H20. Swish and/or swallow 66mL of this mixture, 4min before meals and at bedtime, up to QID, for soreness    . metoprolol succinate (TOPROL-XL) 25 MG 24 hr tablet Take 25 mg by mouth daily.    . mineral oil enema Place 133 mLs (1 enema total) rectally once. For severe constipation. 133 mL 5  . Multiple Vitamin (MULTIVITAMIN WITH MINERALS) TABS tablet Take 1 tablet by mouth daily.    Marland Kitchen omeprazole (PRILOSEC) 40 MG capsule Take 40 mg by mouth daily.    . polyvinyl alcohol (LIQUIFILM TEARS) 1.4 % ophthalmic solution 2 drops at bedtime.    . senna (SENOKOT) 8.6 MG TABS tablet Take 2 tablets (17.2 mg total) by mouth 2 (two) times daily as needed for mild constipation. To prevent constipation. 30 each 2  . silver sulfADIAZINE (SILVADENE) 1 % cream Apply 1 application topically 2 (two) times daily.    . sodium fluoride (FLUORISHIELD) 1.1 % GEL dental gel Instill one drop of gel per tooth space of  fluoride tray. Place over teeth for 5 minutes. Remove. Spit out excess. Repeat nightly. 120 mL prn  . tamsulosin (FLOMAX) 0.4 MG CAPS capsule Take 0.4 mg by mouth.    Marland Kitchen HYDROcodone-acetaminophen (NORCO) 7.5-325 MG per tablet Take 1 tablet by mouth every 4 (four) hours as needed for moderate pain. (Patient not taking: Reported on 08/05/2014) 60 tablet 0   No current facility-administered medications for this encounter.    Physical Findings: The patient is in no acute distress. Patient is alert and oriented.  Wt Readings from Last 3 Encounters:  08/05/14 164 lb 1.6 oz (74.435 kg)  07/30/14 168 lb 11.2 oz (76.522 kg)  07/28/14 169 lb 4.8 oz (76.794 kg)     weight is 164 lb 1.6 oz (74.435 kg). His oral temperature is 97.9 F (36.6 C). His blood pressure is 107/63 and his pulse is 91. His respiration is 20. Marland Kitchen  Oropharynx -  dry mucosa, no thrush.  Skin healing well   Lungs CTAB, heart RRR, no peripheral edema in ankles.  Lab Findings: Lab Results  Component Value Date   WBC 4.8 07/25/2014   HGB 13.8 07/25/2014   HCT 42.1 07/25/2014   MCV 83.5 07/25/2014   PLT 188 07/25/2014   CMP Latest Ref Rng 08/05/2014 07/28/2014 07/25/2014  Glucose 70 - 140 mg/dl 165(H) 91 92  BUN 7.0 - 26.0 mg/dL 12.0 15.9 11  Creatinine 0.7 - 1.3 mg/dL 0.9 1.0 0.99  Sodium 136 - 145 mEq/L 141 141 139  Potassium 3.5 - 5.1 mEq/L 3.7 3.8 3.9  Chloride 96 - 112 mmol/L - - 104  CO2 22 - 29 mEq/L 27 24 26   Calcium 8.4 - 10.4 mg/dL 9.1 9.3 9.3  Total Protein 6.0 - 8.3 g/dL - - -  Total Bilirubin 0.2 - 1.2 mg/dL - - -  Alkaline Phos 39 - 117 U/L - - -  AST 0 - 37 U/L - - -  ALT 0 - 53 U/L - - -    Lab Results  Component Value Date   TSH 4.59* 02/13/2014    Radiographic Findings: Dg Abd 1 View  07/25/2014   CLINICAL DATA:  Evaluate for retained barium in the bowel prior to gastrostomy tube placement  EXAM: ABDOMEN - 1 VIEW  COMPARISON:  CT scan of the abdomen dated January 10, 2014.  FINDINGS: There is contrast present throughout the colon. Exuberant diverticulosis is present in the ascending, descending, and rectosigmoid portions of the colon. No small bowel or stomach barium is present. There is multilevel degenerative disc disease of the lumbar spine with thoracolumbar curvature convex towards the right. There is curvilinear calcification which corresponds to calcification within a lower pole cyst in the left kidney on the previous CT scan.  IMPRESSION: There is retained barium within the colon and evidence of exuberant diverticulosis.   Electronically Signed   By: David  Martinique   On: 07/25/2014 16:32   Dg Op Swallowing Func-medicare/speech Path  07/22/2014    CLINICAL DATA: dysphagia   FLUOROSCOPY FOR SWALLOWING FUNCTION STUDY:  Fluoroscopy was provided for swallowing function study, which was  administered by a speech pathologist.  Final results and recommendations  from this study are contained within the speech pathology report.    Ir Fluoro Rm 30-60 Min  07/25/2014   CLINICAL DATA:  HEAD AND NECK CANCER, STATUS POST RADIATION, DYSPHAGIA, MALNUTRITION  EXAM: IR FLOURO RM 0-60 MIN  COMPARISON:  07/25/2014  FINDINGS: Oral gastric catheter was advanced into the stomach. Stomach  was insufflated with air. Fluoroscopic exam of the abdomen performed. This reveals a high position transverse colon which is anterior to the stomach fundus. Percutaneous puncture below the transverse colon into the stomach would risk vascular injury within the mesocolon. Therefore percutaneous gastrostomy cannot be performed in this fashion. Recommend surgical evaluation for gastrostomy placement.  IMPRESSION: High positioned transverse colon in relation to the stomach. percutaneous fluoroscopic tube placement would traverse the transverse mesocolon . See above comment. This was discussed at length with the family.   Electronically Signed   By: Jerilynn Mages.  Shick M.D.   On: 07/25/2014 15:57    Impression/Plan:    1) Head and Neck Cancer Status:  Healing from RT  2) Nutritional Status: poor PO intake r/t lack of desire to eat. Pain is now gone. We spoke about strategies to improve this.  Goal sheet given. - weight: still falling - PEG tube: none  3) Risk Factors: patient knows to use good "sun hygiene"   4) Swallowing: has been seen by SLP and advised to continue with current diet as tolerated  5) Dental: Encouraged to continue regular followup with dentistry, and dental hygiene including f/u on 3-23 with Dr [name]  6) Thyroid function: preexisting condition of hypothyroidism   - on medication Lab Results  Component Value Date   TSH 4.59* 02/13/2014    7) Social: No active social  issues to address at this time   8) Other: PT recommended; Rx written to [facility] but they have to pay out of pocket. Thus, I'll refer for Home Health PT  PO intake still suboptimal and he feels better with IVF; Continue IVF QOD until next f/u.   9) Follow-up in 7 days.. The patient was encouraged to call with any issues or questions before then.  _____________________________________   [name], MD

## 2014-08-06 NOTE — Telephone Encounter (Signed)
CALLED PATIENT TO INFORM OF FU WITH DR. Isidore Moos ON 08-13-14 @ 10:40 AM, LVM FOR A RETURN CALL

## 2014-08-07 DIAGNOSIS — C4402 Squamous cell carcinoma of skin of lip: Secondary | ICD-10-CM | POA: Diagnosis not present

## 2014-08-07 DIAGNOSIS — C7989 Secondary malignant neoplasm of other specified sites: Secondary | ICD-10-CM | POA: Diagnosis not present

## 2014-08-08 ENCOUNTER — Ambulatory Visit (HOSPITAL_BASED_OUTPATIENT_CLINIC_OR_DEPARTMENT_OTHER): Payer: Medicare Other

## 2014-08-08 ENCOUNTER — Other Ambulatory Visit: Payer: Self-pay | Admitting: *Deleted

## 2014-08-08 VITALS — BP 119/68 | HR 52 | Temp 97.8°F | Resp 18

## 2014-08-08 DIAGNOSIS — C089 Malignant neoplasm of major salivary gland, unspecified: Secondary | ICD-10-CM

## 2014-08-08 DIAGNOSIS — E860 Dehydration: Secondary | ICD-10-CM | POA: Diagnosis not present

## 2014-08-08 DIAGNOSIS — C4440 Unspecified malignant neoplasm of skin of scalp and neck: Secondary | ICD-10-CM

## 2014-08-08 MED ORDER — SODIUM CHLORIDE 0.9 % IV SOLN
Freq: Once | INTRAVENOUS | Status: AC
  Administered 2014-08-08: 14:00:00 via INTRAVENOUS
  Filled 2014-08-08: qty 1000

## 2014-08-08 NOTE — Patient Instructions (Signed)

## 2014-08-11 ENCOUNTER — Ambulatory Visit (HOSPITAL_BASED_OUTPATIENT_CLINIC_OR_DEPARTMENT_OTHER): Payer: Medicare Other

## 2014-08-11 VITALS — BP 115/40 | HR 53 | Temp 97.7°F | Resp 19

## 2014-08-11 DIAGNOSIS — C089 Malignant neoplasm of major salivary gland, unspecified: Secondary | ICD-10-CM

## 2014-08-11 DIAGNOSIS — E860 Dehydration: Secondary | ICD-10-CM

## 2014-08-11 DIAGNOSIS — C4440 Unspecified malignant neoplasm of skin of scalp and neck: Secondary | ICD-10-CM

## 2014-08-11 MED ORDER — SODIUM CHLORIDE 0.9 % IV SOLN
INTRAVENOUS | Status: DC
  Administered 2014-08-11: 09:00:00 via INTRAVENOUS
  Filled 2014-08-11: qty 1000

## 2014-08-11 NOTE — Patient Instructions (Signed)
Dehydration, Adult Dehydration is when you lose more fluids from the body than you take in. Vital organs like the kidneys, brain, and heart cannot function without a proper amount of fluids and salt. Any loss of fluids from the body can cause dehydration.  CAUSES   Vomiting.  Diarrhea.  Excessive sweating.  Excessive urine output.  Fever. SYMPTOMS  Mild dehydration  Thirst.  Dry lips.  Slightly dry mouth. Moderate dehydration  Very dry mouth.  Sunken eyes.  Skin does not bounce back quickly when lightly pinched and released.  Dark urine and decreased urine production.  Decreased tear production.  Headache. Severe dehydration  Very dry mouth.  Extreme thirst.  Rapid, weak pulse (more than 100 beats per minute at rest).  Cold hands and feet.  Not able to sweat in spite of heat and temperature.  Rapid breathing.  Blue lips.  Confusion and lethargy.  Difficulty being awakened.  Minimal urine production.  No tears. DIAGNOSIS  Your caregiver will diagnose dehydration based on your symptoms and your exam. Blood and urine tests will help confirm the diagnosis. The diagnostic evaluation should also identify the cause of dehydration. TREATMENT  Treatment of mild or moderate dehydration can often be done at home by increasing the amount of fluids that you drink. It is best to drink small amounts of fluid more often. Drinking too much at one time can make vomiting worse. Refer to the home care instructions below. Severe dehydration needs to be treated at the hospital where you will probably be given intravenous (IV) fluids that contain water and electrolytes. HOME CARE INSTRUCTIONS   Ask your caregiver about specific rehydration instructions.  Drink enough fluids to keep your urine clear or pale yellow.  Drink small amounts frequently if you have nausea and vomiting.  Eat as you normally do.  Avoid:  Foods or drinks high in sugar.  Carbonated  drinks.  Juice.  Extremely hot or cold fluids.  Drinks with caffeine.  Fatty, greasy foods.  Alcohol.  Tobacco.  Overeating.  Gelatin desserts.  Wash your hands well to avoid spreading bacteria and viruses.  Only take over-the-counter or prescription medicines for pain, discomfort, or fever as directed by your caregiver.  Ask your caregiver if you should continue all prescribed and over-the-counter medicines.  Keep all follow-up appointments with your caregiver. SEEK MEDICAL CARE IF:  You have abdominal pain and it increases or stays in one area (localizes).  You have a rash, stiff neck, or severe headache.  You are irritable, sleepy, or difficult to awaken.  You are weak, dizzy, or extremely thirsty. SEEK IMMEDIATE MEDICAL CARE IF:   You are unable to keep fluids down or you get worse despite treatment.  You have frequent episodes of vomiting or diarrhea.  You have blood or green matter (bile) in your vomit.  You have blood in your stool or your stool looks black and tarry.  You have not urinated in 6 to 8 hours, or you have only urinated a small amount of very dark urine.  You have a fever.  You faint. MAKE SURE YOU:   Understand these instructions.  Will watch your condition.  Will get help right away if you are not doing well or get worse. Document Released: 05/23/2005 Document Revised: 08/15/2011 Document Reviewed: 01/10/2011 ExitCare Patient Information 2015 [employer]. This information is not intended to replace advice given to you by your health care provider. Make sure you discuss any questions you have with your health care   provider.  

## 2014-08-12 ENCOUNTER — Telehealth: Payer: Self-pay | Admitting: *Deleted

## 2014-08-12 ENCOUNTER — Other Ambulatory Visit: Payer: Self-pay | Admitting: Internal Medicine

## 2014-08-12 ENCOUNTER — Encounter: Payer: Self-pay | Admitting: Neurology

## 2014-08-12 ENCOUNTER — Ambulatory Visit (INDEPENDENT_AMBULATORY_CARE_PROVIDER_SITE_OTHER): Payer: Medicare Other | Admitting: Neurology

## 2014-08-12 VITALS — BP 108/68 | HR 63 | Wt 166.3 lb

## 2014-08-12 DIAGNOSIS — G629 Polyneuropathy, unspecified: Secondary | ICD-10-CM | POA: Diagnosis not present

## 2014-08-12 DIAGNOSIS — G510 Bell's palsy: Secondary | ICD-10-CM

## 2014-08-12 DIAGNOSIS — C4402 Squamous cell carcinoma of skin of lip: Secondary | ICD-10-CM | POA: Diagnosis not present

## 2014-08-12 DIAGNOSIS — I951 Orthostatic hypotension: Secondary | ICD-10-CM

## 2014-08-12 DIAGNOSIS — C7989 Secondary malignant neoplasm of other specified sites: Secondary | ICD-10-CM | POA: Diagnosis not present

## 2014-08-12 NOTE — Progress Notes (Signed)
[name]  Follow-up Visit   Date: 08/12/2014    Adrian West MRN: [id number] DOB: 1927/12/22   Interim History: Adrian Lamer Jasmin Brooke Bonito. is a 81 y.o. 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    History of present illness: He was seeing Dr. Erling Cruz for 30+ years for peripheral neuropathy, left hand tremor, and vertigo. He was last seen in March 2014 at which time he was told that there was no additional therapies that could be offered. Adrian West has a 10-year history of vertigo, described as intermittent lightheadedness. It is triggered by changes in position, such standing up or leaning over. Symptoms last about 2-minutes. It occurs about 6-10 times a day, depending on activity level. He has never fallen, but does hold on to objects. Rolling in bed or abrupt changes in head position does not worse these symptoms.   Additionally, he reports "blacking out" over the past 2-3 years. There is no associated loss of consciousness with these spells, but he feels tired and weak. Spells generally last about an hour and there is no prodromal symptoms. In June, he passed out and again did not lose consciousness but continued to feel weak. He was admitted and symptoms were attributed to dehydration.   Of note, he has had an episode of slurred speech in May 2012 which lasted several hours. This occurred in the setting of missing 2 days of his aspirin. He had a stroke workup which was negative and symptoms were attributed to possible TIA. MRI of the brain 10/20/2010 showed mild periventricular subcortical chronic small vessel disease. Vessel imaging was normal. He was briefly on Plavix but developed a rash so  was maintained on aspirin.   Regarding his shortness of breath, his workup has included EMG (5 06-2010), CPK, VGCC, anti-Hu, anti-Ri, and acetylcholine receptor antibodies which is within normal. Etiology: phrenic nerve injury.  Follow-up 05/21/2013:  Clinically unchanged. Evaluated by Dr. Beryle Beams whose work-up for consistent with MGUS and Dr. Caryl Comes performed tilt tablet which did not show evidence of othostasis.  He recommended using waist high stockings, but did not try this.   - Follow-up 08/20/2013:  He reports no worsening of tremors and lightheadedness.  His wife bought vertigo natural oils that he places behind his ear, which he reports to have helped during his balance exercises.  There has been no interval falls and he has no new neurological complaints.  He is scheduled to go to [facility].  - UPDATE 02/11/2014:  He had right Bell's palsy about 30-days and had MRI brain which shows right facial nerve enhacement without associated parotid mass.  Over the past 2 weeks, he had also noticed problems with taste.  Denies any abnormal twitching, tearing, drooling, or high pitched noise.  He was evaluated at [facility] in May for dyspnea who did not recommend surgical intervention for R phrenic injury.    - UPDATE 08/12/2014: After his last visit with [place], patient started developing swelling around his neck and pain and was ultimately found to have recurrence of right squamous cell carcinoma involving the posterior ventricular region.  He underwent extensive resection of the right side of the neck and face including parotid duct to me and sacrificing the facial nerve, followed by head and neck  radiation.  He has not been tolerating food since getting radiation and now is on a liquid diet.  He complains of difficulty hearing and has had several adjustments made to his hearing bilaterally.  He continues to have severe right facial palsy and numbness of left side of the face.  He does  endorse significant tearing and drooling on the right.    Medications:  Current Outpatient Prescriptions on File Prior to Visit  Medication Sig Dispense Refill  . acetaminophen (TYLENOL) 500 MG tablet Take 500 mg by mouth every 6 (six) hours as needed (As needed for pain).    Marland Kitchen aspirin EC 81 MG tablet Take 81 mg by mouth daily.    Marland Kitchen atorvastatin (LIPITOR) 10 MG tablet TAKE ONE TABLET EACH DAY 30 tablet 0  . bisacodyl (DULCOLAX) 5 MG EC tablet Take 5 mg by mouth daily as needed for moderate constipation. 10 mg po orally, as needed    . clopidogrel (PLAVIX) 75 MG tablet TAKE ONE TABLET BY MOUTH ONCE DAILY 30 tablet 1  . erythromycin ophthalmic ointment 1 application 2 (two) times daily. Place a thin strip into the right eye twice daily    . FentaNYL 37.5 MCG/HR PT72 Place 37.5 mcg/hr onto the skin every 3 (three) days. 5 patch 0  . HYDROcodone-acetaminophen (NORCO) 7.5-325 MG per tablet Take 1 tablet by mouth every 4 (four) hours as needed for moderate pain. (Patient not taking: Reported on 08/05/2014) 60 tablet 0  . Hypromellose (GENTEAL MILD OP) Apply 1 application to eye as needed (both eyes).    Marland Kitchen ibuprofen (ADVIL,MOTRIN) 600 MG tablet Take 600 mg by mouth 3 (three) times daily.    Marland Kitchen levothyroxine (SYNTHROID, LEVOTHROID) 150 MCG tablet Take 150 mcg by mouth daily before breakfast.    . lidocaine (XYLOCAINE) 2 % solution Patient: Mix 1part 2% viscous lidocaine, 1part H20. Swish and/or swallow 52mL of this mixture, 58min before meals and at bedtime, up to QID, for soreness    . metoprolol succinate (TOPROL-XL) 25 MG 24 hr tablet Take 25 mg by mouth daily.    . mineral oil enema Place 133 mLs (1 enema total) rectally once. For severe constipation. 133 mL 5  . Multiple Vitamin (MULTIVITAMIN WITH MINERALS) TABS tablet Take 1 tablet by mouth daily.    Marland Kitchen omeprazole (PRILOSEC) 40 MG capsule Take 40 mg by mouth daily.    . polyvinyl alcohol (LIQUIFILM TEARS) 1.4 % ophthalmic solution 2 drops at bedtime.     . senna (SENOKOT) 8.6 MG TABS tablet Take 2 tablets (17.2 mg total) by mouth 2 (two) times daily as needed for mild constipation. To prevent constipation. 30 each 2  . silver sulfADIAZINE (SILVADENE) 1 % cream Apply 1 application topically 2 (two) times daily.    . sodium fluoride (FLUORISHIELD) 1.1 % GEL dental gel Instill one drop of gel per tooth space of fluoride tray. Place over teeth for 5 minutes. Remove. Spit out excess. Repeat nightly. 120 mL prn  . tamsulosin (FLOMAX) 0.4 MG CAPS capsule Take 0.4 mg by mouth.     No current facility-administered medications on file prior to visit.    Allergies: No Known Allergies   Review of Systems:  CONSTITUTIONAL: No fevers, chills, night sweats, or weight loss.   EYES: No visual changes or eye pain ENT: No hearing changes.  No history of nose bleeds.   RESPIRATORY: No cough, wheezing +shortness of breath.   CARDIOVASCULAR: Negative for chest pain, and palpitations.   GI: Negative  for abdominal discomfort, blood in stools or black stools.  No recent change in bowel habits.   GU:  No history of incontinence.   MUSCLOSKELETAL: No history of joint pain or swelling.  No myalgias.   SKIN: Negative for lesions, rash, and itching.   HEMATOLOGY/ONCOLOGY: Negative for prolonged bleeding, bruising easily, and swollen nodes ENDOCRINE: Negative for cold or heat intolerance, polydipsia or goiter.   PSYCH:  No depression or anxiety symptoms.   NEURO: As Above.   Vital Signs:  BP 108/68 mmHg  Pulse 63  Wt 166 lb 5 oz (75.439 kg)  SpO2 96%  Neurological Exam: MENTAL STATUS including orientation to time, place, person, recent and remote memory, attention span and concentration, language, and fund of knowledge is normal. Speech is not dysarthric, but his sentences are short due to dyspnea.  CRANIAL NERVES: Pupils round and reactive to light. Normal conjugate, extra-ocular eye movements in all directions of gaze. Facial sensation on the right is  reduced over the V2 distribution. There is marked facial asymmetry due to severe facial nerve paralysis. At baseline, there is mild right ptosis, incomplete eye closure, absence of wrinkling of the frontalis on the right, severe weakness of the zygomaticus muscles as evidenced by lack of smile, and moderate weakness of the orbicularis orbicularis oculi and orbicularis oris muscles. There are no abnormal movements to suggest aberrant reinnervation. Tongue is midline.  Hearing is reduced to finger rub bilaterally.  MOTOR: Mild atrophy of intrinsic hand muscles bilaterally. Left > right postural hand tremor with low amplitude. Tremor is not present at rest. Coarse tremulous hand writing.  Right Upper Extremity:    Left Upper Extremity:    Deltoid  5/5   Deltoid  5/5   Biceps  5/5   Biceps  5/5   Triceps  5-/5   Triceps  5-/5   Wrist extensors  5/5   Wrist extensors  5/5   Wrist flexors  5/5   Wrist flexors  5/5   Finger extensors  5/5   Finger extensors  5/5   Finger flexors  5/5   Finger flexors  5/5   Dorsal interossei  4/5   Dorsal interossei  4/5   Abductor pollicis  5/5   Abductor pollicis  5/5   Tone (Ashworth scale)  0   Tone (Ashworth scale)  0    MSRs:  Right Left  brachioradialis  2+   brachioradialis  2+   biceps  2+   biceps  2+   triceps  1+   triceps  1+   patellar  2+   patellar  2+    COORDINATION/GAIT: Bradykinesia with reduced rate of finger tapping and toe tapping bilaterally (L >R) . Slightly stooped posture with small steps, gait appears narrow and stable.    Data: MRI brain with and without contrast 03/06/2014: Continued RIGHT facial nerve enhancement, concern raised for retrograde perineural tumor spread.  Incompletely visualized, But suspicious retromandibular infratemporal area of signal abnormality concerning for tumor. This could represent deep extension from squamous cell carcinoma, versus a RIGHT parotid tumor. Recommend MRI face for further evaluation.  Findings discussed with ordering provider.  MRI brain wwo contrast 01/15/2014:  Findings consistent with RIGHT facial nerve Bell's palsy. See discussion above.  Stable chronic intracranial changes without acute stroke or mass lesion.  MRI brain 10/22/2012:  No acute infarct. Mild small vessel disease type changes. Global atrophy without hydrocephalus. No intracranial mass lesion detected on this unenhanced exam. Polypoid complex opacification left  maxillary sinus. Remodeling of the left alveolar ridge without change. CT imaging of the sinuses can be obtained for further delineation if clinically desired.   US carotids 10/11/2010: Mild intimal thickening bilaterally without significant plaque formation or stenosis.  Labs 03/06/2013:  ceruloplasmin 32, copper 108, MMA 0.23, HbA1c 5.5, TSH 2.00, B12 415 SPEP with IFE: Monoclonal IgG kappa protein is present Urine IFE:  No monoclonal free light chains (Bence Jones Protein) are detected.Urine IFE shows polyclonal increase in free Kappa and/or free Lambda light chains  Tilt tablet 05/06/2013:  Non-diagnostic test.  Given history though have suggested that he consider orthostatic maneuvers and or compression hose(waist high-20-30 mm) to see if empiric therapy assoc with improvement    IMPRESSION/PLAN: 1. 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 He also has mild facial numbness which may be due to localized irritation to the trigeminal nerve.  Based on the severity of his facial weakness, I do not suspect that he will significant improvement going forward, which patient understands   Unfortunately, there is no intervention that we can offer to expedite nerve recovery   I stressed the importance of keeping his eyes lubricated with eyedrops as well as using an eye patch at night to minimize the risk of corneal abrasion  2.  Idiopathic peripheral neuropathy with dysautonomia   Clinically stable  Neuropathy labs have been  unrevealing, except that he has MGUS (monoclonal IgG kappa gammopathy)  He has tried a number of medications in the past, none of which have helped. I explained that given his symptoms are predominately large fiber sensory loss, medications are not helpful.    Recommended knee high stockings, but patient deferred  3.  Benign essential tremors, subtle parkinsonian features which I will follow clinically  Per patient, has been tried on a number of medications without any change in symptoms  He is able to function without severe limitation, so will hold off on repeat trial of medication  4.  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  Return to clinic in 56-months  The duration of this appointment visit was 20 minutes of face-to-face time with the patient.  Greater than 50% of this time was spent in counseling, explanation of diagnosis, planning of further management, and coordination of care.   Thank you for allowing me to participate in patient's care.  If I can answer any additional questions, I would be pleased to do so.    Sincerely,    Roan Miklos K. Posey Pronto, DO

## 2014-08-12 NOTE — Progress Notes (Signed)
  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, with subsequent right posterior auricular/parotid metastasis  Stage IV T4N0M0 (due to perineural invasion at skull base)   Indication for treatment:  Post operative, curative      Radiation treatment dates:   06/11/2014-07/22/2014  Site/dose:   Right parotid bed, posterior auricular, right upper neck /  66 Gy in 33 fractions to positive margins, 60 Gy in 33 fractions to remainder of at risk tissue was prescribed; He decided to stop RT prematurely after 29 fractions (total dose received 58 Gy to positive margins; 52.72 Gy to remaining at risk tissue.)  Beams/energy:   IMRT helical / 6MV photons  Narrative: The patient tolerated radiation treatment with difficulty. He had severe pain, dry mouth, dysgeusia, and poor PO intake, weakness, weight loss, and skin irritation.  Palliative care was consulted, and feeding tube was ordered.  Ultimately, he did not have a feeding tube placed due to the location of his bowel in front of his stomach.  In spite of fentanyl patches and improvement in pain control, patient had such poor taste that his PO intake remained poor. IV fluids were given on multiple occasions. He decided to stop RT prematurely after 29 fractions (total dose received 58 Gy to positive margins; 52.72 Gy to remaining at risk tissue.)  Plan: The patient has completed radiation treatment. The patient will return to radiation oncology clinic for routine followup within one week. I advised them to call or return sooner if they have any questions or concerns related to their recovery or treatment.  -----------------------------------  [name], MD

## 2014-08-12 NOTE — Telephone Encounter (Signed)
Patient called 08/11/14 @ 1620. 1. He indicated he has a conflict with tomorrow's 10:30 appt scheduled with Dr. Isidore Moos.    He has an appt with his ophthalmologist that is difficult to reschedule.   Requested reschedule.  I indicated I would notify scheduler. 2. He reported throat has become increasingly sore, mouth and tongue "burning".  He noted he is off the Fentanyl patch and not taking any PRN pain medication.  I suggested he restart Lidocaine rinses prior to meals, take PRN Hydrocodone as necessary, consider applying Fentanyl patch again if needed.  I asked him to update in the next day or so with status.  He verbalized understanding.  Gayleen Orem, RN, BSN, [place] [facility] [place] [phone]

## 2014-08-13 ENCOUNTER — Ambulatory Visit
Admission: RE | Admit: 2014-08-13 | Discharge: 2014-08-13 | Disposition: A | Discharge status: Home / Self Care | Payer: Medicare Other | Source: Ambulatory Visit | Attending: Radiation Oncology | Admitting: Radiation Oncology

## 2014-08-13 DIAGNOSIS — H16211 Exposure keratoconjunctivitis, right eye: Secondary | ICD-10-CM | POA: Diagnosis not present

## 2014-08-13 DIAGNOSIS — H524 Presbyopia: Secondary | ICD-10-CM | POA: Diagnosis not present

## 2014-08-13 DIAGNOSIS — H02102 Unspecified ectropion of right lower eyelid: Secondary | ICD-10-CM | POA: Diagnosis not present

## 2014-08-13 DIAGNOSIS — G510 Bell's palsy: Secondary | ICD-10-CM | POA: Diagnosis not present

## 2014-08-14 ENCOUNTER — Telehealth: Payer: Self-pay | Admitting: *Deleted

## 2014-08-14 DIAGNOSIS — C7989 Secondary malignant neoplasm of other specified sites: Secondary | ICD-10-CM | POA: Diagnosis not present

## 2014-08-14 DIAGNOSIS — C4402 Squamous cell carcinoma of skin of lip: Secondary | ICD-10-CM | POA: Diagnosis not present

## 2014-08-14 NOTE — Telephone Encounter (Signed)
Returned patient's VM, discussed his expressed desire for Megace to help him with his absence of appetite, "I have no appetite at all".  He indicated a friend's spouse had excellent results with this medication and he is extremely eager to try it.   I arranged for him to see Selena Lesser, NP, Symptom Mgt, tomorrow morning at 10:00.  He verbalized understanding of appt time, registration procedure.  Gayleen Orem, RN, BSN, [place] at [place] [phone]

## 2014-08-15 ENCOUNTER — Telehealth: Payer: Self-pay | Admitting: *Deleted

## 2014-08-15 ENCOUNTER — Telehealth: Payer: Self-pay | Admitting: Hematology and Oncology

## 2014-08-15 ENCOUNTER — Ambulatory Visit (HOSPITAL_BASED_OUTPATIENT_CLINIC_OR_DEPARTMENT_OTHER): Payer: Medicare Other | Admitting: Nurse Practitioner

## 2014-08-15 VITALS — BP 115/56 | HR 62 | Temp 97.4°F | Resp 28 | Wt 164.3 lb

## 2014-08-15 DIAGNOSIS — R630 Anorexia: Secondary | ICD-10-CM | POA: Diagnosis not present

## 2014-08-15 DIAGNOSIS — R634 Abnormal weight loss: Secondary | ICD-10-CM

## 2014-08-15 DIAGNOSIS — E860 Dehydration: Secondary | ICD-10-CM | POA: Diagnosis not present

## 2014-08-15 DIAGNOSIS — C089 Malignant neoplasm of major salivary gland, unspecified: Secondary | ICD-10-CM | POA: Diagnosis not present

## 2014-08-15 MED ORDER — MIRTAZAPINE 7.5 MG PO TABS: ORAL_TABLET | ORAL | Status: DC

## 2014-08-15 NOTE — Telephone Encounter (Signed)
No POF sent. Schedule per telephone notes for symptom management in chart.

## 2014-08-15 NOTE — Telephone Encounter (Signed)
Received call from Lianne Bushy, PT for [place]. He states he saw patient yesterday, and patient reported some difficulty swallowing. Barnabas Lister feels patient would benefit from a Speech Therapy consultation. He is requesting permission to enter that order. Informed Barnabas Lister that Dr Isidore Moos is out of this office until 08/18/14 but will route his concern/request to her. He verbalized understanding.

## 2014-08-16 ENCOUNTER — Encounter: Payer: Self-pay | Admitting: Nurse Practitioner

## 2014-08-16 DIAGNOSIS — R630 Anorexia: Secondary | ICD-10-CM | POA: Insufficient documentation

## 2014-08-16 DIAGNOSIS — E860 Dehydration: Secondary | ICD-10-CM | POA: Insufficient documentation

## 2014-08-16 NOTE — Assessment & Plan Note (Signed)
Patient has completed radiation therapy; but continues with close followup per radiation oncology.  Patient continues with IV fluid rehydration on an every other day basis for the time being due to chronic dehydration.  Patient has plans to return to medical oncology for labs and a followup visit on 01/15/2015.  He knows to call in the interim for any new worries or concerns.

## 2014-08-16 NOTE — Progress Notes (Signed)
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  Patient is status post radiation therapy.  He is currently undergoing observation and close supportive care as needed.  Patient presented to the [facility] today with complaint of continued minimal appetite, dehydration, and chronic weight loss.  Patient states that he try streaking protein drinks on a daily basis; and has been holding off on obtaining a peg tube for supplementation.  He has been receiving IV fluid rehydration on an every other day basis for his chronic dehydration.  He is interested in discussing Megace or Remeron for appetite stimulant.   HPI  ROS  Past Medical History  Diagnosis Date  . Thyroid disease   . Hypertension   . GERD (gastroesophageal reflux disease)     PMH of stricture , recurrent;[name]  . Benign neoplasm of colon   . Tremor, essential   . Diverticulosis   . TIA (transient ischemic attack) May 2012  . Skin cancer   . Neuropathy, peripheral   . Lung abnormality 11/25/2008    "nerve damage from heart surgery"  . Hyperlipidemia   . MGUS (monoclonal gammopathy of unknown significance) 04/11/2013  . Diaphragm paralysis   . Esophageal stricture   . Hiatal hernia   . Bell's palsy   . Vertigo 2005  . Gait disturbance   . History of radiation therapy 06/11/14-07/22/14    skin cancer of scalp/face    Past Surgical History  Procedure Laterality Date  . Appendectomy    . Cataract extraction      [name]  . Cholecystectomy  1998    [name]  . Inguinal hernia repair    . Lumbar laminectomy    . Nasal sinus surgery    . Tonsillectomy    . Esophageal dilation       X3;[name]  . 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 Laparoscopic ablation renal mass  08/14/2009  . Mohs surgery  2012     ear  . Tilt table study N/A 05/06/2013    Procedure: TILT TABLE STUDY;  Surgeon:  [name], MD;  Location: [facility] CATH LAB;  Service: Cardiovascular;  Laterality: N/A;  . Right heart catheterization N/A 03/03/2014    Procedure: RIGHT HEART CATH;  Surgeon: [name], MD;  Location: [facility] CATH LAB;  Service: Cardiovascular;  Laterality: N/A;    has HYPOTHYROIDISM; MITRAL REGURGITATION; HYPERTENSION; GERD; Memory loss; DYSPNEA/SHORTNESS OF BREATH; SPINAL STENOSIS, LUMBAR; TIA (transient ischemic attack); PVC's (premature ventricular contractions); Other and unspecified hyperlipidemia; Overweight (BMI 25.0-29.9); MGUS (monoclonal gammopathy of unknown significance); Monoclonal B-cell lymphocytosis; Cranial nerve VII palsy; Polycythemia, secondary; Diaphragmatic paralysis; Left upper lobe pneumonia; Salivary gland malignant neoplasm; Dyspnea and respiratory abnormality; Skin cancer of scalp or skin of neck; DNR (do not resuscitate) discussion; Palliative care encounter; Weakness generalized; Loss of weight; Dysphagia; Anorexia; Weight loss; and Dehydration on his problem list.    has No Known Allergies.    Medication List       This list is accurate as of: 08/15/14 11:59 PM.  Always use your most recent med list.               acetaminophen 500 MG tablet  Commonly known as:  TYLENOL  Take 500 mg by mouth every 6 (six) hours as needed (As needed for pain).     aspirin EC 81  MG tablet  Take 81 mg by mouth daily.     atorvastatin 10 MG tablet  Commonly known as:  LIPITOR  TAKE ONE TABLET EACH DAY     bisacodyl 5 MG EC tablet  Commonly known as:  DULCOLAX  Take 5 mg by mouth daily as needed for moderate constipation. 10 mg po orally, as needed     clopidogrel 75 MG tablet  Commonly known as:  PLAVIX  TAKE ONE TABLET BY MOUTH ONCE DAILY     erythromycin ophthalmic ointment  1 application 2 (two) times daily. Place a thin strip into the right eye twice daily     FentaNYL 37.5 MCG/HR Pt72  Place 37.5 mcg/hr onto the skin every 3 (three) days.     GENTEAL MILD OP   Apply 1 application to eye as needed (both eyes).     HYDROcodone-acetaminophen 7.5-325 MG per tablet  Commonly known as:  NORCO  Take 1 tablet by mouth every 4 (four) hours as needed for moderate pain.     ibuprofen 600 MG tablet  Commonly known as:  ADVIL,MOTRIN  Take 600 mg by mouth 3 (three) times daily.     levothyroxine 150 MCG tablet  Commonly known as:  SYNTHROID, LEVOTHROID  Take 150 mcg by mouth daily before breakfast.     lidocaine 2 % solution  Commonly known as:  XYLOCAINE  MIX 1 PART LIDOCAINE & 1 PART WATER AND SWISH AND/OR SWALLOW 4m OF THIS MIXTURE 30 MINUTES BEFORE MEALS AND AT BEDTIME UPTO 4 TIMES DAILY     metoprolol succinate 25 MG 24 hr tablet  Commonly known as:  TOPROL-XL  Take 25 mg by mouth daily.     mineral oil enema  Place 133 mLs (1 enema total) rectally once. For severe constipation.     mirtazapine 7.5 MG tablet  Commonly known as:  REMERON  1-2 tabs PO QHS PRN.     multivitamin with minerals Tabs tablet  Take 1 tablet by mouth daily.     omeprazole 40 MG capsule  Commonly known as:  PRILOSEC  Take 40 mg by mouth daily.     polyvinyl alcohol 1.4 % ophthalmic solution  Commonly known as:  LIQUIFILM TEARS  2 drops at bedtime.     senna 8.6 MG Tabs tablet  Commonly known as:  SENOKOT  Take 2 tablets (17.2 mg total) by mouth 2 (two) times daily as needed for mild constipation. To prevent constipation.     silver sulfADIAZINE 1 % cream  Commonly known as:  SILVADENE  Apply 1 application topically 2 (two) times daily.     sodium fluoride 1.1 % Gel dental gel  Commonly known as:  FLUORISHIELD  Instill one drop of gel per tooth space of fluoride tray. Place over teeth for 5 minutes. Remove. Spit out excess. Repeat nightly.     tamsulosin 0.4 MG Caps capsule  Commonly known as:  FLOMAX  TAKE 1 CAPSULE BY MOUTH         PHYSICAL EXAMINATION  Oncology Vitals 08/15/2014 08/12/2014 08/11/2014 08/11/2014 08/08/2014 08/08/2014 08/06/2014  Height -  - - - - - -  Weight 74.526 kg 75.439 kg - - - - -  Weight (lbs) 164 lbs 5 oz 166 lbs 5 oz - - - - -  BMI (kg/m2) - - - - - - -  Temp 97.4 - - 97.7 - 97.8 97.6  Pulse 62 63 53 57 52 54 70  Resp 28 - 19 18  18 - -  SpO2 - 96 - 100 - 100 -  BSA (m2) - - - - - - -   BP Readings from Last 3 Encounters:  08/15/14 115/56  08/12/14 108/68  08/11/14 115/40    Physical Exam  Constitutional: He is oriented to person, place, and time. Vital signs are normal. He appears dehydrated. He appears unhealthy.  HENT:  Right side of patient's face with chronic edema and malformation from cancer diagnosis his baseline.  Eyes: Conjunctivae and EOM are normal. Pupils are equal, round, and reactive to light.  Neck: Normal range of motion.  Pulmonary/Chest: Effort normal. No respiratory distress.  Musculoskeletal: Normal range of motion.  Neurological: He is alert and oriented to person, place, and time.  Skin: Skin is warm and dry. There is pallor.  Psychiatric: Affect normal.  Nursing note and vitals reviewed.   LABORATORY DATA:. No visits with results within 3 Day(s) from this visit. Latest known visit with results is:  Hospital Outpatient Visit on 08/05/2014  Component Date Value Ref Range Status  . Sodium 08/05/2014 141  136 - 145 mEq/L Final  . Potassium 08/05/2014 3.7  3.5 - 5.1 mEq/L Final  . Chloride 08/05/2014 105  98 - 109 mEq/L Final  . CO2 08/05/2014 27  22 - 29 mEq/L Final  . Glucose 08/05/2014 165* 70 - 140 mg/dl Final  . BUN 08/05/2014 12.0  7.0 - 26.0 mg/dL Final  . Creatinine 08/05/2014 0.9  0.7 - 1.3 mg/dL Final  . Calcium 08/05/2014 9.1  8.4 - 10.4 mg/dL Final  . Anion Gap 08/05/2014 10  3 - 11 mEq/L Final  . EGFR 08/05/2014 80* >90 ml/min/1.73 m2 Final   eGFR is calculated using the CKD-EPI Creatinine Equation (2009)     RADIOGRAPHIC STUDIES: No results found.  ASSESSMENT/PLAN:    Anorexia Patient continues to complain of minimal appetite and poor oral intake.  He is  requesting consideration of an appetite stimulant-either Megace or Remeron.  After review of both medications and their side effects-patient would like to try Remeron 7.5 mg at bedtime as an appetite stimulant.  Advised patient he may take 7.5 mg up to 15 mg at bedtime for appetite stimulant.  Advised both patient and his wife that the Remeron may make him a little sleepy; so he should make sure he ambulates cautiously to prevent falls.   Dehydration Patient continues with minimal appetite and poor oral intake.  He continues to receive IV fluid rehydration on an every other day basis for the time being.   Salivary gland malignant neoplasm Patient has completed radiation therapy; but continues with close followup per radiation oncology.  Patient continues with IV fluid rehydration on an every other day basis for the time being due to chronic dehydration.  Patient has plans to return to medical oncology for labs and a followup visit on 01/15/2015.  He knows to call in the interim for any new worries or concerns.   Weight loss Patient continues with very little appetite and poor oral intake.  He continues to lose weight.  Patient states that he prefers to hold on getting a PEG tube for the time being.  He continues drinking protein drinks as often as possible.  He also receives IV fluid rehydration on an every other day basis per radiation oncology.   Patient stated understanding of all instructions; and was in agreement with this plan of care. The patient knows to call the clinic with any problems, questions or concerns.  Review/collaboration with Dr. Isidore Moos and Dr. Alvy Bimler regarding all aspects of patient's visit today.   Total time spent with patient was 25 minutes;  with greater than 75 percent of that time spent in face to face counseling regarding patient's symptoms,  and coordination of care and follow up.  Disclaimer: This note was dictated with voice recognition software. Similar  sounding words can inadvertently be transcribed and may not be corrected upon review.   Drue Second, NP 08/16/2014

## 2014-08-16 NOTE — Assessment & Plan Note (Signed)
Patient continues with minimal appetite and poor oral intake.  He continues to receive IV fluid rehydration on an every other day basis for the time being.

## 2014-08-16 NOTE — Assessment & Plan Note (Signed)
Patient continues to complain of minimal appetite and poor oral intake.  He is requesting consideration of an appetite stimulant-either Megace or Remeron.  After review of both medications and their side effects-patient would like to try Remeron 7.5 mg at bedtime as an appetite stimulant.  Advised patient he may take 7.5 mg up to 15 mg at bedtime for appetite stimulant.  Advised both patient and his wife that the Remeron may make him a little sleepy; so he should make sure he ambulates cautiously to prevent falls.

## 2014-08-16 NOTE — Assessment & Plan Note (Signed)
Patient continues with very little appetite and poor oral intake.  He continues to lose weight.  Patient states that he prefers to hold on getting a PEG tube for the time being.  He continues drinking protein drinks as often as possible.  He also receives IV fluid rehydration on an every other day basis per radiation oncology.

## 2014-08-18 ENCOUNTER — Encounter: Payer: Self-pay | Admitting: Internal Medicine

## 2014-08-18 ENCOUNTER — Other Ambulatory Visit: Payer: Self-pay | Admitting: Radiation Oncology

## 2014-08-18 ENCOUNTER — Telehealth: Payer: Self-pay | Admitting: *Deleted

## 2014-08-18 DIAGNOSIS — C4440 Unspecified malignant neoplasm of skin of scalp and neck: Secondary | ICD-10-CM

## 2014-08-18 NOTE — Telephone Encounter (Signed)
-----   Message from Heath Lark, MD sent at 08/18/2014  8:44 AM EDT ----- Regarding: how is he I just saw Selena Lesser saw him last week. Can you check to see how is he? I can see him on 3/28 at 2 pm if he can make it Thanks

## 2014-08-18 NOTE — Telephone Encounter (Signed)
Called patient to check on status, spoke with his wife. 1. She reported:  His oral intake is currently 4 boxes of Breeze, 2 cans ensure and some water.  Remeron prescribed las Friday has not helped his appetite. 2. I informed Dr. Isidore Moos:  She placed orders for labs and IVF for tomorrow.  RN Florian Buff is to call patient in morning with appt times. 3. I called patient wife, informed her of plan.  She verbalized appreciation.  Gayleen Orem, RN, BSN, [place] at [place] [phone]

## 2014-08-18 NOTE — Telephone Encounter (Signed)
Per Dr Isidore Moos, Lianne Bushy PT of [place] may order speech therapy for pt. Spoke with Barnabas Lister and advised him of Dr Pearlie Oyster approval for speech therapy and informed him that per Dr Isidore Moos, this RN will fax information re: Modified Barium Swallow. Per Barnabas Lister, faxed information to [facility], attention [name], fax [phone].

## 2014-08-18 NOTE — Telephone Encounter (Signed)
Left message for patient to call back. Checking on his status and see if he can come to appt on 09/01/14 at 2pm with Dr Alvy Bimler, This was left in message

## 2014-08-18 NOTE — Progress Notes (Signed)
Patient ID: Adrian West, male   DOB: 09-14-1927, 81 y.o.   MRN: [id number]    HISTORY AND PHYSICAL  Location:  [facility]    room 149 Place of Service: SNF (31)   Extended Emergency Contact Information Primary Emergency Contact: Lisenby,[name] Address: [place], [place] Phone: [phone] Relation: Spouse Secondary Emergency Contact: Hendrix,Patty  [place] Phone: [phone] Relation: None  Advanced Directive information Does patient have an advance directive?: Yes, Type of Advance Directive: Mental Health Advance Directive  Chief Complaint  Patient presents with  . 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    HPI:  Patient was admitted to [facility] 04/30/2014 following hospitalization from 04/25/2014 through 04/30/2014 at [facility]. 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 There was a partial paralysis of the right face. He underwent a surgical procedure that resulted in a right partial right colectomy and right parotidectomy. He had neck dissection and further repairs which involved a mid forehead brow lift and a right tarsal strip for ectropion repair. A right static sling using AlloDerm graft for elevation of the nasolabial fold was done. A right lateral lip and closure of the pre-existing parotidectomy using a flap was done.  It is anticipated that he will have a total of 30 days of radiation to the area of involvement.  He is medically complicated with multiple diagnoses as listed below.  He is having some trouble with swallowing and he has a gait disturbance for which he uses a cane. There are  problems of chronic vertigo since 2005. Other issues of hypertension, lumbar stenosis, and tremor seem under reasonable control. hypothyroidism  Past Medical History  Diagnosis Date  . Benign neoplasm of colon   . Tremor, essential   . Diverticulosis   . TIA (transient ischemic attack) May 2012  . Skin cancer   . Neuropathy, peripheral   . Lung abnormality 11/25/2008    "nerve damage from heart surgery"  . MGUS (monoclonal gammopathy of unknown significance) 04/11/2013  . Diaphragm paralysis   . Esophageal stricture   . Hiatal hernia   . Bell's palsy   . Vertigo 2005  . Gait disturbance   . History of radiation therapy 06/11/14-07/22/14    skin cancer of scalp/face  . Anorexia 08/16/2014  . 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 DNR (do not resuscitate) discussion 07/22/2014  . Dyspnea and respiratory abnormality 03/10/2014  . DYSPNEA/SHORTNESS OF BREATH 02/11/2010    R hemidiaphragm paralysis post mitral valve surgery 11/2008 with resultant Class 3-4 dyspnea; restrictive pattern on PFTs in excess of compromise expected with diaphragm injury (Note: ? Interstitial lung disease 10/15/13 Xray [facility]) 01/02/12 new subsegmental ATX on L (vs 09/03/11) Seeing Dr Theda Sers [facility]  Seen by Dr Corrin Parker, [facility],  02/2011. Component of thoracic height loss & alcohol related neuropathy questioned. No plication recommended  5/4- 10/14/2013 @ [facility] ; Dr  Ellis Parents.10/15/13 new focal consolidation mid anterior L lung:aspiration vs PNA . Underlying interstitial lung disease.10/15/13 Rx sent to [place] for Doxycycline & Augmentin by Dr Jeannine Kitten ( not picked up as of 10/18/13).  Flew  back to [facility]; seen @ 10/16/13 [facility], [place]. Levaquin X 5 days , Dr Evalee Mutton    . GERD 02/14/2008    Qualifier: Diagnosis of  By: [name] MD, William    . HYPERTENSION 02/14/2008        . HYPOTHYROIDISM 02/14/2008  . Loss of weight 07/22/2014  . Memory loss 12/31/2009    Qualifier: Diagnosis  of  By: [name] MD, [name]    . MITRAL REGURGITATION 02/14/2008    Qualifier: Diagnosis of  By: [name] MD, Cherlynn Kaiser MV surgery 11/2008, [name].complcated by paralysis of diaphragm     . 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 Other and unspecified hyperlipidemia 02/24/2012  . Polycythemia, secondary 01/10/2014  . PVC's (premature ventricular contractions) 04/05/2011  . 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 Skin cancer of scalp or skin of neck 05/21/2014  . SPINAL STENOSIS, LUMBAR 07/28/2010    Qualifier: Diagnosis of  By: [name] MD, [name], NS ( retired)    . Weakness generalized 07/22/2014  . Interstitial lung disease 05/05/2014  . Vertigo 2005  . Deaf   . Tremor 2015    Past Surgical History  Procedure Laterality Date  . Appendectomy    . Cataract extraction      [name]  . Cholecystectomy  1998    [name]  . Inguinal hernia repair    . Lumbar laminectomy    . Nasal sinus surgery    . Tonsillectomy    . Esophageal dilation       X3;[name]  . 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 Laparoscopic ablation renal mass  08/14/2009  . Mohs surgery  2012     ear  . Tilt table study N/A 05/06/2013    Procedure: TILT TABLE STUDY;  Surgeon: [name], MD;  Location: [facility] CATH LAB;  Service: Cardiovascular;  Laterality: N/A;  . Right heart catheterization N/A 03/03/2014    Procedure: RIGHT HEART CATH;  Surgeon: [name], MD;  Location: [facility] CATH LAB;  Service: Cardiovascular;  Laterality: N/A;  . Partial auriculectomy Right 04/25/2014  . Parotidectomy Right 04/25/2014    Sacrifice of the facial nerve  . Neck dissection Right 04/25/2014  . Brow lift Right 04/25/2014    Midforehead  . Ectropion repair Right  04/25/2014    Right tarsal strip  . Nasolabial repair Right 04/25/2014    Right static sling using AlloDerm graft elevation of the nasolabial fold    Patient Care Team: Janith Lima, MD as PCP - General (Internal Medicine) [name], MD as Attending Physician (Internal Medicine) [name], MD as Consulting Physician (Otolaryngology) [name], MD as Consulting Physician (Ophthalmology) Harriett Sine, MD as Consulting Physician (Dermatology) [name], MD as Consulting Physician (Cardiology) [name], DO as Consulting Physician (Neurology) [name], MD as Referring Physician (Otolaryngology) [name], MD as Attending Physician (Radiation Oncology) [name], RN as Oncology Nurse Navigator Brand Males, MD as Consulting Physician (Pulmonary Disease) [name], MD as Referring Physician (Otolaryngology)  History   Social History  . Marital Status: Married    Spouse Name: N/A  . Number of Children: N/A  .  Years of Education: N/A   Occupational History  . retired Equities trader     Social History Main Topics  . Smoking status: Never Smoker   . Smokeless tobacco: Never Used  . Alcohol Use: 0.0 oz/week    0 Standard drinks or equivalent per week     Comment: Drinks wine 3 per night  . Drug Use: No  . Sexual Activity: Not Currently   Other Topics Concern  . 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   Married x 53 years.  They have three daughters.     reports that he has never smoked. He has never used smokeless tobacco. He reports that he drinks alcohol. He reports that he does not use illicit drugs.  Family History  Problem Relation Age of Onset  . Heart attack Father 70  . Colon cancer Mother 65  . Heart attack Maternal Grandmother 60  . Uterine cancer Paternal Grandmother   . Stroke Neg Hx   . Diabetes Neg Hx   . Healthy  Sister    Family Status  Relation Status Death Age  . Mother Deceased 48    colon cancer  . Father Deceased 28    Heart attack     Immunization History  Administered Date(s) Administered  . Influenza Split 03/07/2011, 02/24/2012  . Influenza Whole 03/09/2009, 02/11/2010  . Influenza,inj,Quad PF,36+ Mos 02/11/2013, 02/13/2014  . Pneumococcal Polysaccharide-23 05/11/2004, 02/11/2010  . Pneumococcal-Unspecified 04/06/2013  . Td 08/17/2005    No Known Allergies  Medications: Patient's Medications  New Prescriptions   FENTANYL 37.5 MCG/HR PT72    Place 37.5 mcg/hr onto the skin every 3 (three) days.   MINERAL OIL ENEMA    Place 133 mLs (1 enema total) rectally once. For severe constipation.   MIRTAZAPINE (REMERON) 7.5 MG TABLET    1-2 tabs PO QHS PRN.   SODIUM FLUORIDE (FLUORISHIELD) 1.1 % GEL DENTAL GEL    Instill one drop of gel per tooth space of fluoride tray. Place over teeth for 5 minutes. Remove. Spit out excess. Repeat nightly.  Previous Medications   ACETAMINOPHEN (TYLENOL) 500 MG TABLET    Take 500 mg by mouth every 6 (six) hours as needed (As needed for pain).   ASPIRIN EC 81 MG TABLET    Take 81 mg by mouth daily.   BISACODYL (DULCOLAX) 5 MG EC TABLET    Take 5 mg by mouth daily as needed for moderate constipation. 10 mg po orally, as needed   ERYTHROMYCIN OPHTHALMIC OINTMENT    1 application 2 (two) times daily. Place a thin strip into the right eye twice daily   HYPROMELLOSE (GENTEAL MILD OP)    Apply 1 application to eye as needed (both eyes).   IBUPROFEN (ADVIL,MOTRIN) 600 MG TABLET    Take 600 mg by mouth 3 (three) times daily.   LEVOTHYROXINE (SYNTHROID, LEVOTHROID) 150 MCG TABLET    Take 150 mcg by mouth daily before breakfast.   METOPROLOL SUCCINATE (TOPROL-XL) 25 MG 24 HR TABLET    Take 25 mg by mouth daily.   MULTIPLE VITAMIN (MULTIVITAMIN WITH MINERALS) TABS TABLET    Take 1 tablet by mouth daily.   OMEPRAZOLE (PRILOSEC) 40 MG CAPSULE    Take 40 mg by mouth  daily.   POLYVINYL ALCOHOL (LIQUIFILM TEARS) 1.4 % OPHTHALMIC SOLUTION    2 drops at bedtime.   SILVER SULFADIAZINE (SILVADENE) 1 % CREAM  Apply 1 application topically 2 (two) times daily.  Modified Medications   Modified Medication Previous Medication   ATORVASTATIN (LIPITOR) 10 MG TABLET atorvastatin (LIPITOR) 10 MG tablet      TAKE ONE TABLET EACH DAY    Take 10 mg by mouth daily.   CLOPIDOGREL (PLAVIX) 75 MG TABLET clopidogrel (PLAVIX) 75 MG tablet      TAKE ONE TABLET BY MOUTH ONCE DAILY    TAKE ONE TABLET BY MOUTH ONCE DAILY   HYDROCODONE-ACETAMINOPHEN (NORCO) 7.5-325 MG PER TABLET HYDROcodone-acetaminophen (NORCO) 7.5-325 MG per tablet      Take 1 tablet by mouth every 4 (four) hours as needed for moderate pain.    Take 1 tablet by mouth every 4 (four) hours as needed for moderate pain.   LIDOCAINE (XYLOCAINE) 2 % SOLUTION lidocaine (XYLOCAINE) 2 % solution      MIX 1 PART LIDOCAINE & 1 PART WATER AND SWISH AND/OR SWALLOW 41m OF THIS MIXTURE 30 MINUTES BEFORE MEALS AND AT BEDTIME UPTO 4 TIMES DAILY    Patient: Mix 1part 2% viscous lidocaine, 1part H20. Swish and/or swallow 124mof this mixture, 302mbefore meals and at bedtime, up to QID, for soreness   SENNA (SENOKOT) 8.6 MG TABS TABLET senna (SENOKOT) 8.6 MG TABS tablet      Take 2 tablets (17.2 mg total) by mouth 2 (two) times daily as needed for mild constipation. To prevent constipation.    Take 1 tablet (8.6 mg total) by mouth at bedtime. To prevent constipation.   TAMSULOSIN (FLOMAX) 0.4 MG CAPS CAPSULE tamsulosin (FLOMAX) 0.4 MG CAPS capsule      TAKE 1 CAPSULE BY MOUTH    Take 0.4 mg by mouth.  Discontinued Medications   CLOPIDOGREL (PLAVIX) 75 MG TABLET    Take 75 mg by mouth daily.   HYDROCODONE-ACETAMINOPHEN (NORCO/VICODIN) 5-325 MG PER TABLET    Take 2 tablets by mouth every 6 (six) hours as needed for moderate pain.   TRAMADOL (ULTRAM) 50 MG TABLET    Take 50 mg by mouth every 6 (six) hours as needed for moderate pain.       Review of Systems  Constitutional: Positive for activity change (Anorexia) and fatigue. Negative for fever, appetite change and unexpected weight change.  HENT: Positive for hearing loss. Negative for congestion, ear pain, rhinorrhea, sore throat, tinnitus, trouble swallowing and voice change.        Status post right partial auriculectomy and parotidectomy.  Eyes:       Corrective lenses  Respiratory: Positive for shortness of breath. Negative for cough, choking, chest tightness and wheezing.        Possible interstitial fibrosis on chest x-ray. History of paralysis of the right diaphragm following surgery for mitral valve replacement.   Cardiovascular: Positive for palpitations. Negative for chest pain and leg swelling.       History of mitral valve replacement in 2005 for severe mitral regurgitation. History PVC, hypertension.  Gastrointestinal: Negative for nausea, abdominal pain, diarrhea, constipation and abdominal distention.  Endocrine: Negative for cold intolerance, heat intolerance, polydipsia, polyphagia and polyuria.       History hypothyroidism  Genitourinary: Negative for dysuria, urgency, frequency and testicular pain.       Not incontinent  Musculoskeletal: Positive for gait problem (using a cane). Negative for myalgias, back pain, arthralgias and neck pain.  Skin: Negative for color change, pallor and rash.       Multiple skin cancers of scalp  Allergic/Immunologic: Negative.   Neurological: Positive  for tremors and numbness. Negative for dizziness, syncope, speech difficulty, weakness and headaches.  Hematological: Negative for adenopathy. Does not bruise/bleed easily.  Psychiatric/Behavioral: Negative for hallucinations, behavioral problems, confusion, sleep disturbance and decreased concentration. The patient is not nervous/anxious.     Filed Vitals:   05/05/14 1335  BP: 117/72  Pulse: 66  Temp: 96.8 F (36 C)  Resp: 22  Height: 5' 10" (1.778 m)  Weight:  184 lb 9.6 oz (83.734 kg)   Body mass index is 26.49 kg/(m^2).  Physical Exam  Constitutional: He is oriented to person, place, and time. He appears well-developed and well-nourished. No distress.  HENT:  Right Ear: External ear normal.  Left Ear: External ear normal.  Nose: Nose normal.  Mouth/Throat: Oropharynx is clear and moist. No oropharyngeal exudate.  Bilateral partial deafness  Eyes: Conjunctivae and EOM are normal. Pupils are equal, round, and reactive to light.  Neck: No JVD present. No tracheal deviation present. No thyromegaly present.  Cardiovascular: Normal rate, regular rhythm, normal heart sounds and intact distal pulses.  Exam reveals no gallop and no friction rub.   No murmur heard. Pulmonary/Chest: No respiratory distress. He has no wheezes. He has rales. He exhibits no tenderness.  Abdominal: He exhibits no distension and no mass. There is no tenderness.  Musculoskeletal: Normal range of motion. He exhibits no edema or tenderness.  Unstable gait  Lymphadenopathy:    He has no cervical adenopathy.  Neurological: He is alert and oriented to person, place, and time. He has normal reflexes. No cranial nerve deficit. Coordination normal.  Skin: No rash noted. No erythema. No pallor.  Skin cancers of the scalp. Recovering from facial incisions from most recent surgery.  Psychiatric: He has a normal mood and affect. His behavior is normal. Judgment and thought content normal.     Labs reviewed: Hospital Outpatient Visit on 03/31/2014  Component Date Value Ref Range Status  . WBC 03/31/2014 7.2  4.0 - 10.5 K/uL Final  . RBC 03/31/2014 4.79  4.22 - 5.81 MIL/uL Final  . Hemoglobin 03/31/2014 15.0  13.0 - 17.0 g/dL Final  . HCT 03/31/2014 44.9  39.0 - 52.0 % Final  . MCV 03/31/2014 93.7  78.0 - 100.0 fL Final  . MCH 03/31/2014 31.3  26.0 - 34.0 pg Final  . MCHC 03/31/2014 33.4  30.0 - 36.0 g/dL Final  . RDW 03/31/2014 13.0  11.5 - 15.5 % Final  . Platelets 03/31/2014  326  150 - 400 K/uL Final  . Neutrophils Relative % 03/31/2014 82* 43 - 77 % Final  . Neutro Abs 03/31/2014 6.0  1.7 - 7.7 K/uL Final  . Lymphocytes Relative 03/31/2014 11* 12 - 46 % Final  . Lymphs Abs 03/31/2014 0.8  0.7 - 4.0 K/uL Final  . Monocytes Relative 03/31/2014 5  3 - 12 % Final  . Monocytes Absolute 03/31/2014 0.3  0.1 - 1.0 K/uL Final  . Eosinophils Relative 03/31/2014 2  0 - 5 % Final  . Eosinophils Absolute 03/31/2014 0.1  0.0 - 0.7 K/uL Final  . Basophils Relative 03/31/2014 0  0 - 1 % Final  . Basophils Absolute 03/31/2014 0.0  0.0 - 0.1 K/uL Final  . Prothrombin Time 03/31/2014 13.3  11.6 - 15.2 seconds Final  . INR 03/31/2014 1.00  0.00 - 1.49 Final  Scanned Document on 03/14/2014  Component Date Value Ref Range Status  . INR 10/07/2013 0.8* 0.9 - 1.1 Final  Admission on 03/03/2014, Discharged on 03/03/2014  Component Date  Value Ref Range Status  . Sodium 03/03/2014 139  137 - 147 mEq/L Final  . Potassium 03/03/2014 4.5  3.7 - 5.3 mEq/L Final  . Chloride 03/03/2014 103  96 - 112 mEq/L Final  . CO2 03/03/2014 27  19 - 32 mEq/L Final  . Glucose, Bld 03/03/2014 98  70 - 99 mg/dL Final  . BUN 03/03/2014 14  6 - 23 mg/dL Final  . Creatinine, Ser 03/03/2014 0.90  0.50 - 1.35 mg/dL Final  . Calcium 03/03/2014 8.7  8.4 - 10.5 mg/dL Final  . GFR calc non Af Amer 03/03/2014 76* >90 mL/min Final  . GFR calc Af Amer 03/03/2014 88* >90 mL/min Final   Comment: (NOTE)                          The eGFR has been calculated using the CKD EPI equation.                          This calculation has not been validated in all clinical situations.                          eGFR's persistently <90 mL/min signify possible Chronic Kidney                          Disease.  . Anion gap 03/03/2014 9  5 - 15 Final  . WBC 03/03/2014 5.6  4.0 - 10.5 K/uL Final  . RBC 03/03/2014 4.84  4.22 - 5.81 MIL/uL Final  . Hemoglobin 03/03/2014 15.4  13.0 - 17.0 g/dL Final  . HCT 03/03/2014 44.6  39.0 -  52.0 % Final  . MCV 03/03/2014 92.1  78.0 - 100.0 fL Final  . MCH 03/03/2014 31.8  26.0 - 34.0 pg Final  . MCHC 03/03/2014 34.5  30.0 - 36.0 g/dL Final  . RDW 03/03/2014 13.6  11.5 - 15.5 % Final  . Platelets 03/03/2014 195  150 - 400 K/uL Final  . Prothrombin Time 03/03/2014 12.8  11.6 - 15.2 seconds Final  . INR 03/03/2014 0.96  0.00 - 1.49 Final  . pH, Ven 03/03/2014 7.380* 7.250 - 7.300 Final  . pCO2, Ven 03/03/2014 44.2* 45.0 - 50.0 mmHg Final  . pO2, Ven 03/03/2014 35.0  30.0 - 45.0 mmHg Final  . Bicarbonate 03/03/2014 26.1* 20.0 - 24.0 mEq/L Final  . TCO2 03/03/2014 27  0 - 100 mmol/L Final  . O2 Saturation 03/03/2014 66.0   Final  . Acid-Base Excess 03/03/2014 1.0  0.0 - 2.0 mmol/L Final  . Sample type 03/03/2014 VENOUS   Final  . pH, Arterial 03/03/2014 7.400  7.350 - 7.450 Final  . pCO2 arterial 03/03/2014 39.3  35.0 - 45.0 mmHg Final  . pO2, Arterial 03/03/2014 72.0* 80.0 - 100.0 mmHg Final  . Bicarbonate 03/03/2014 24.4* 20.0 - 24.0 mEq/L Final  . TCO2 03/03/2014 26  0 - 100 mmol/L Final  . O2 Saturation 03/03/2014 94.0   Final  . Sample type 03/03/2014 ARTERIAL   Final  Appointment on 02/13/2014  Component Date Value Ref Range Status  . WBC 02/13/2014 7.4  4.0 - 10.5 K/uL Final  . RBC 02/13/2014 5.32  4.22 - 5.81 Mil/uL Final  . Hemoglobin 02/13/2014 16.9  13.0 - 17.0 g/dL Final  . HCT 02/13/2014 49.2  39.0 - 52.0 % Final  . MCV 02/13/2014 92.5  78.0 - 100.0 fl Final  . MCHC 02/13/2014 34.4  30.0 - 36.0 g/dL Final  . RDW 02/13/2014 13.6  11.5 - 15.5 % Final  . Platelets 02/13/2014 341.0  150.0 - 400.0 K/uL Final  . Neutrophils Relative % 02/13/2014 82.3* 43.0 - 77.0 % Final  . Lymphocytes Relative 02/13/2014 11.3* 12.0 - 46.0 % Final  . Monocytes Relative 02/13/2014 4.9  3.0 - 12.0 % Final  . Eosinophils Relative 02/13/2014 1.1  0.0 - 5.0 % Final  . Basophils Relative 02/13/2014 0.4  0.0 - 3.0 % Final  . Neutro Abs 02/13/2014 6.1  1.4 - 7.7 K/uL Final  . Lymphs Abs  02/13/2014 0.8  0.7 - 4.0 K/uL Final  . Monocytes Absolute 02/13/2014 0.4  0.1 - 1.0 K/uL Final  . Eosinophils Absolute 02/13/2014 0.1  0.0 - 0.7 K/uL Final  . Basophils Absolute 02/13/2014 0.0  0.0 - 0.1 K/uL Final  . Sodium 02/13/2014 137  135 - 145 mEq/L Final  . Potassium 02/13/2014 4.7  3.5 - 5.1 mEq/L Final  . Chloride 02/13/2014 101  96 - 112 mEq/L Final  . CO2 02/13/2014 29  19 - 32 mEq/L Final  . Glucose, Bld 02/13/2014 91  70 - 99 mg/dL Final  . BUN 02/13/2014 17  6 - 23 mg/dL Final  . Creatinine, Ser 02/13/2014 1.0  0.4 - 1.5 mg/dL Final  . Calcium 02/13/2014 9.2  8.4 - 10.5 mg/dL Final  . GFR 02/13/2014 79.32  >60.00 mL/min Final  . TSH 02/13/2014 4.59* 0.35 - 4.50 uIU/mL Final    Dg Abd 1 View  07/25/2014   CLINICAL DATA:  Evaluate for retained barium in the bowel prior to gastrostomy tube placement  EXAM: ABDOMEN - 1 VIEW  COMPARISON:  CT scan of the abdomen dated January 10, 2014.  FINDINGS: There is contrast present throughout the colon. Exuberant diverticulosis is present in the ascending, descending, and rectosigmoid portions of the colon. No small bowel or stomach barium is present. There is multilevel degenerative disc disease of the lumbar spine with thoracolumbar curvature convex towards the right. There is curvilinear calcification which corresponds to calcification within a lower pole cyst in the left kidney on the previous CT scan.  IMPRESSION: There is retained barium within the colon and evidence of exuberant diverticulosis.   Electronically Signed   By: David  Martinique   On: 07/25/2014 16:32   Dg Op Swallowing Func-medicare/speech Path  07/22/2014   CLINICAL DATA: dysphagia   FLUOROSCOPY FOR SWALLOWING FUNCTION STUDY:  Fluoroscopy was provided for swallowing function study, which was  administered by a speech pathologist.  Final results and recommendations  from this study are contained within the speech pathology report.    Ir Fluoro Rm 30-60 Min  07/25/2014   CLINICAL  DATA:  HEAD AND NECK CANCER, STATUS POST RADIATION, DYSPHAGIA, MALNUTRITION  EXAM: IR FLOURO RM 0-60 MIN  COMPARISON:  07/25/2014  FINDINGS: Oral gastric catheter was advanced into the stomach. Stomach was insufflated with air. Fluoroscopic exam of the abdomen performed. This reveals a high position transverse colon which is anterior to the stomach fundus. Percutaneous puncture below the transverse colon into the stomach would risk vascular injury within the mesocolon. Therefore percutaneous gastrostomy cannot be performed in this fashion. Recommend surgical evaluation for gastrostomy placement.  IMPRESSION: High positioned transverse colon in relation to the stomach. percutaneous fluoroscopic tube placement would traverse the transverse mesocolon . See above comment. This was discussed at length with the family.   Electronically  Signed   By: Jerilynn Mages.  Shick M.D.   On: 07/25/2014 15:57     Assessment/Plan  1. Salivary gland malignant neoplasm Status post surgery. To continue with radiation therapy.  2. Secondary malignancy of parotid lymph nodes Status post surgery. To continue with radiation therapy.  3. Weakness generalized Engaged with physical therapy and occupational therapy.  4. Loss of weight Related to loss of appetite  5. Essential hypertension Controlled  6. Hypothyroidism, unspecified hypothyroidism type Compensated  7. Dyspnea Chronic. Seeing Dr. [name].  8. Dysphagia Mild. May be related to loss of appetite and loss of weight. Continue to follow

## 2014-08-19 ENCOUNTER — Telehealth: Payer: Self-pay | Admitting: *Deleted

## 2014-08-19 ENCOUNTER — Encounter: Payer: Self-pay | Admitting: *Deleted

## 2014-08-19 ENCOUNTER — Telehealth: Payer: Self-pay

## 2014-08-19 ENCOUNTER — Ambulatory Visit: Payer: Medicare Other

## 2014-08-19 ENCOUNTER — Ambulatory Visit
Admission: RE | Admit: 2014-08-19 | Discharge: 2014-08-19 | Disposition: A | Discharge status: Home / Self Care | Payer: Medicare Other | Source: Ambulatory Visit | Attending: Radiation Oncology | Admitting: Radiation Oncology

## 2014-08-19 VITALS — BP 93/49 | HR 88 | Temp 97.9°F | Resp 18

## 2014-08-19 DIAGNOSIS — C7989 Secondary malignant neoplasm of other specified sites: Secondary | ICD-10-CM | POA: Diagnosis not present

## 2014-08-19 DIAGNOSIS — C4440 Unspecified malignant neoplasm of skin of scalp and neck: Secondary | ICD-10-CM

## 2014-08-19 DIAGNOSIS — C4402 Squamous cell carcinoma of skin of lip: Secondary | ICD-10-CM | POA: Diagnosis not present

## 2014-08-19 LAB — BASIC METABOLIC PANEL (CC13)
Anion Gap: 13 mEq/L — ABNORMAL HIGH (ref 3–11)
BUN: 21.3 mg/dL (ref 7.0–26.0)
CHLORIDE: 102 meq/L (ref 98–109)
CO2: 26 meq/L (ref 22–29)
CREATININE: 0.9 mg/dL (ref 0.7–1.3)
Calcium: 8.8 mg/dL (ref 8.4–10.4)
EGFR: 79 mL/min/{1.73_m2} — AB (ref >90)
Glucose: 126 mg/dl (ref 70–140)
POTASSIUM: 3.2 meq/L — AB (ref 3.5–5.1)
Sodium: 142 mEq/L (ref 136–145)

## 2014-08-19 MED ORDER — SODIUM CHLORIDE 0.9 % IV SOLN
INTRAVENOUS | Status: DC
  Administered 2014-08-19: 15:00:00 via INTRAVENOUS
  Filled 2014-08-19: qty 1000

## 2014-08-19 NOTE — Progress Notes (Signed)
To provide support and encouragement, care continuity and to assess for needs, met patient in Infusion where he was receiving IVF.  He reported he is feeling better today than he has for the past 3 days, recognized the newly prescribed Remeron as reason.   He reported he still lacks appetite.  I provided him an Epic appt calendar showing currently scheduled appts.  He understands he has IVF scheduled for this Thursday.  Rick Diehl, RN, BSN, CHPN Head & Neck Oncology Navigator [facility] [place] [phone]    

## 2014-08-19 NOTE — Telephone Encounter (Signed)
Per Dr Pearlie Oyster orders, patient scheduled for BMP today at 2:45 pm, IVF at 3:15 pm. IVF scheduled with Ubaldo Glassing, RN, med onc infusion room. She stated that if there is an opening, she will call patient to move IVF appointment to earlier time. Requested she call this RN back if pt's IVF appt is moved so his lab appt can be rescheduled as well. She verbalized understanding. Spoke with Ms Weyenberg and gave her today's appointment times. Also informed her that RN may call if an earlier infusion time becomes available. Ms Desta verbalized appreciation and understanding.

## 2014-08-19 NOTE — Telephone Encounter (Signed)
Left message re: appt time, pt can come in earlier for fluids. No answer, left callback number if pt can come in sooner.

## 2014-08-19 NOTE — Telephone Encounter (Signed)
S/w pt while he was in infusion receiving fluids. He has not had an increase in apetite with the remeron. He was able to take a walk yesterday.

## 2014-08-19 NOTE — Patient Instructions (Signed)

## 2014-08-20 ENCOUNTER — Ambulatory Visit: Payer: Self-pay

## 2014-08-20 ENCOUNTER — Telehealth: Payer: Self-pay | Admitting: *Deleted

## 2014-08-20 DIAGNOSIS — C7989 Secondary malignant neoplasm of other specified sites: Secondary | ICD-10-CM | POA: Diagnosis not present

## 2014-08-20 DIAGNOSIS — C4402 Squamous cell carcinoma of skin of lip: Secondary | ICD-10-CM | POA: Diagnosis not present

## 2014-08-20 NOTE — Telephone Encounter (Signed)
Dr Alvy Bimler would like to see patient on 3/28 @ 2:00pm . Wife will check with patient and call us back.

## 2014-08-21 ENCOUNTER — Other Ambulatory Visit: Payer: Self-pay | Admitting: *Deleted

## 2014-08-21 ENCOUNTER — Telehealth: Payer: Self-pay | Admitting: *Deleted

## 2014-08-21 ENCOUNTER — Ambulatory Visit: Payer: Self-pay

## 2014-08-21 DIAGNOSIS — C4402 Squamous cell carcinoma of skin of lip: Secondary | ICD-10-CM | POA: Diagnosis not present

## 2014-08-21 DIAGNOSIS — C7989 Secondary malignant neoplasm of other specified sites: Secondary | ICD-10-CM | POA: Diagnosis not present

## 2014-08-21 NOTE — Telephone Encounter (Signed)
Received voice mail from Adrian West, speech therapist with [facility]. She states she would like to stimulate Adrian West nerves on the right side of his face with a neuromuscular stimulator similar to a TENS unit. She is asking for Dr Pearlie Oyster input re: safety of this treatment. Will route her request to Dr Isidore Moos.

## 2014-08-22 ENCOUNTER — Ambulatory Visit
Admission: RE | Admit: 2014-08-22 | Discharge: 2014-08-22 | Disposition: A | Discharge status: Home / Self Care | Payer: Medicare Other | Source: Ambulatory Visit | Attending: Radiation Oncology | Admitting: Radiation Oncology

## 2014-08-22 ENCOUNTER — Encounter: Payer: Self-pay | Admitting: Radiation Oncology

## 2014-08-22 VITALS — BP 102/53 | HR 74 | Temp 98.2°F | Resp 20 | Wt 164.4 lb

## 2014-08-22 DIAGNOSIS — E039 Hypothyroidism, unspecified: Secondary | ICD-10-CM

## 2014-08-22 DIAGNOSIS — E038 Other specified hypothyroidism: Secondary | ICD-10-CM

## 2014-08-22 DIAGNOSIS — C4440 Unspecified malignant neoplasm of skin of scalp and neck: Secondary | ICD-10-CM

## 2014-08-22 DIAGNOSIS — C770 Secondary and unspecified malignant neoplasm of lymph nodes of head, face and neck: Secondary | ICD-10-CM

## 2014-08-22 MED ORDER — ZINC 15 MG PO CAPS: ORAL_CAPSULE | ORAL | Status: DC

## 2014-08-22 MED ORDER — FENTANYL 12 MCG/HR TD PT72: 12.5000 ug | MEDICATED_PATCH | TRANSDERMAL | Status: DC

## 2014-08-22 NOTE — Telephone Encounter (Signed)
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 Per Dr Isidore Moos, if Adrian West feels her treatment will be beneficial to Adrian West otherwise, Dr Isidore Moos is not aware of any contraindications for this treatment. Left this caller's name, call back number for any questions.

## 2014-08-22 NOTE — Progress Notes (Signed)
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 Pain Status: none, wearing Duragesic patch, questions getting off patch  Weight changes, if any: stable  Nutritional Status a) intake: Ensure 2-3 cans daily, breeze 4-5 cans daily, no foods, tastes not normal b) using a feeding tube?: na c) weight changes, if any: no  Swallowing Status: "effort to swallow"; throat / tongue/mouth "still sore but better"  Dental (if applicable): When was last visit with dentistry , next appt on 08/27/14 Using fluoride trays daily? yes  When was last ENT visit? When is next ENT visit? Next Wed with Dr [name] and Dr Brayton Caves.  Other notable issues, if any: energy level fluctuates but improving, dry mouth improving, skin behind right ear healed  MAIN COMPLAINT: poor taste in mouth and poor appetite. Remeron not helping this, yet.  ALLERGIES:  has No Known Allergies.  Meds: Current Outpatient Prescriptions  Medication Sig Dispense Refill  . acetaminophen (TYLENOL) 500 MG tablet Take 500 mg by mouth every 6 (six) hours as needed (As needed for  pain).    Marland Kitchen aspirin EC 81 MG tablet Take 81 mg by mouth daily.    Marland Kitchen atorvastatin (LIPITOR) 10 MG tablet TAKE ONE TABLET EACH DAY 30 tablet 0  . bisacodyl (DULCOLAX) 5 MG EC tablet Take 5 mg by mouth daily as needed for moderate constipation. 10 mg po orally, as needed    . clopidogrel (PLAVIX) 75 MG tablet TAKE ONE TABLET BY MOUTH ONCE DAILY 30 tablet 1  . erythromycin ophthalmic ointment 1 application 2 (two) times daily. Place a thin strip into the right eye twice daily    . FentaNYL 37.5 MCG/HR PT72 Place 37.5 mcg/hr onto the skin every 3 (three) days. 5 patch 0  . HYDROcodone-acetaminophen (NORCO) 7.5-325 MG per tablet Take 1 tablet by mouth every 4 (four) hours as needed for moderate pain. 60 tablet 0  . Hypromellose (GENTEAL MILD OP) Apply 1 application to eye as needed (both eyes).    Marland Kitchen ibuprofen (ADVIL,MOTRIN) 600 MG tablet Take 600 mg by mouth 3 (three) times daily.    Marland Kitchen levothyroxine (SYNTHROID, LEVOTHROID) 150 MCG tablet Take 150 mcg by mouth daily before breakfast.    . lidocaine (XYLOCAINE) 2 % solution MIX 1 PART LIDOCAINE & 1 PART WATER AND SWISH AND/OR SWALLOW 77ml OF THIS MIXTURE 30 MINUTES BEFORE MEALS AND AT BEDTIME UPTO 4 TIMES DAILY 100 mL 5  . metoprolol succinate (TOPROL-XL) 25 MG 24 hr tablet Take 25 mg by mouth daily.    . mineral oil enema Place 133 mLs (1 enema total) rectally once. For severe constipation.  133 mL 5  . mirtazapine (REMERON) 7.5 MG tablet 1-2 tabs PO QHS PRN. 30 tablet 0  . Multiple Vitamin (MULTIVITAMIN WITH MINERALS) TABS tablet Take 1 tablet by mouth daily.    Marland Kitchen omeprazole (PRILOSEC) 40 MG capsule Take 40 mg by mouth daily.    . polyvinyl alcohol (LIQUIFILM TEARS) 1.4 % ophthalmic solution 2 drops at bedtime.    . senna (SENOKOT) 8.6 MG TABS tablet Take 2 tablets (17.2 mg total) by mouth 2 (two) times daily as needed for mild constipation. To prevent constipation. 30 each 2  . sodium fluoride (FLUORISHIELD) 1.1 % GEL dental gel Instill one drop of gel  per tooth space of fluoride tray. Place over teeth for 5 minutes. Remove. Spit out excess. Repeat nightly. 120 mL prn  . tamsulosin (FLOMAX) 0.4 MG CAPS capsule TAKE 1 CAPSULE BY MOUTH 30 capsule 11   No current facility-administered medications for this encounter.    Physical Findings: Vitals with BMI 08/22/2014 08/22/2014  Height    Weight  164 lbs 6 oz  BMI    Systolic 546 270  Diastolic 53 80  Pulse 74 66  Respirations  20   The patient is in no acute distress. Patient is alert and oriented.  Wt Readings from Last 3 Encounters:  08/15/14 164 lb 4.8 oz (74.526 kg)  08/12/14 166 lb 5 oz (75.439 kg)  08/05/14 164 lb 1.6 oz (74.435 kg)    weight is 164 lb 6.4 oz (74.571 kg). His oral temperature is 98.2 F (36.8 C). His blood pressure is 102/53 and his pulse is 74. His respiration is 20. Marland Kitchen  Oropharynx -  dry mucosa, no thrush.  Skin healed nicely in RT fields.   Lab Findings: Lab Results  Component Value Date   WBC 4.8 07/25/2014   HGB 13.8 07/25/2014   HCT 42.1 07/25/2014   MCV 83.5 07/25/2014   PLT 188 07/25/2014   CMP Latest Ref Rng 08/19/2014 08/05/2014 07/28/2014  Glucose 70 - 140 mg/dl 126 165(H) 91  BUN 7.0 - 26.0 mg/dL 21.3 12.0 15.9  Creatinine 0.7 - 1.3 mg/dL 0.9 0.9 1.0  Sodium 136 - 145 mEq/L 142 141 141  Potassium 3.5 - 5.1 mEq/L 3.2(L) 3.7 3.8  Chloride 96 - 112 mmol/L - - -  CO2 22 - 29 mEq/L 26 27 24   Calcium 8.4 - 10.4 mg/dL 8.8 9.1 9.3  Total Protein 6.0 - 8.3 g/dL - - -  Total Bilirubin 0.2 - 1.2 mg/dL - - -  Alkaline Phos 39 - 117 U/L - - -  AST 0 - 37 U/L - - -  ALT 0 - 53 U/L - - -    Lab Results  Component Value Date   TSH 4.59* 02/13/2014    Radiographic Findings: Dg Abd 1 View  07/25/2014   CLINICAL DATA:  Evaluate for retained barium in the bowel prior to gastrostomy tube placement  EXAM: ABDOMEN - 1 VIEW  COMPARISON:  CT scan of the abdomen dated January 10, 2014.  FINDINGS: There is contrast present throughout the colon. Exuberant  diverticulosis is present in the ascending, descending, and rectosigmoid portions of the colon. No small bowel or stomach barium is present. There is multilevel degenerative disc disease of the lumbar spine with thoracolumbar curvature convex towards the right. There is curvilinear calcification which corresponds to calcification within a lower pole cyst in the left kidney on the previous CT scan.  IMPRESSION: There is retained barium within the colon and evidence  of exuberant diverticulosis.   Electronically Signed   By: David  Martinique   On: 07/25/2014 16:32   Ir Fluoro Rm 30-60 Min  07/25/2014   CLINICAL DATA:  HEAD AND NECK CANCER, STATUS POST RADIATION, DYSPHAGIA, MALNUTRITION  EXAM: IR FLOURO RM 0-60 MIN  COMPARISON:  07/25/2014  FINDINGS: Oral gastric catheter was advanced into the stomach. Stomach was insufflated with air. Fluoroscopic exam of the abdomen performed. This reveals a high position transverse colon which is anterior to the stomach fundus. Percutaneous puncture below the transverse colon into the stomach would risk vascular injury within the mesocolon. Therefore percutaneous gastrostomy cannot be performed in this fashion. Recommend surgical evaluation for gastrostomy placement.  IMPRESSION: High positioned transverse colon in relation to the stomach. percutaneous fluoroscopic tube placement would traverse the transverse mesocolon . See above comment. This was discussed at length with the family.   Electronically Signed   By: Jerilynn Mages.  Shick M.D.   On: 07/25/2014 15:57    Impression/Plan:    1) Head and Neck Cancer Status:  Healing from RT  2) Nutritional Status: He has improved intake of nutritional shakes very well, weight stabilizing.  H20 intake still limited.  3) Risk Factors: patient knows to use good "sun hygiene"   4) Swallowing: has been seen by SLP and advised to continue with current diet as tolerated  5) Dental: Encouraged to continue regular followup with dentistry, and  dental hygiene including f/u on 3-23 with Dr [name]  6) Thyroid function: preexisting condition of hypothyroidism   - on medication Lab Results  Component Value Date   TSH 4.59* 02/13/2014    7) Social: No active social issues to address at this time   8) Other: PT via [facility]  PO intake still suboptimal and he feels better with IVF; Continue IVF twice/ week  Dysguesia: Will try zinc capsules  Poor appetite: r/t poor taste. Continue Remeron, do not recommend other medications due to potential side effects  Pain: improving.  Rx given for Durgesic 12 mcg patches. May self-taper but with caution, dep on pain levels.  Driving:  Patient is driving currently.  I told him I am not qualified to verify his driving skills on the road.  I advised to not drive on highway or at night.  Advised daughter to have family members monitor his driving for safety.   He is ambulatory, and alert and oriented, and stamina is approaching his baseline.   9) I will check on him during IVF in a week. The patient was encouraged to call with any issues or questions before then.  _____________________________________   [name], MD

## 2014-08-22 NOTE — Progress Notes (Signed)
Pain Status: none, wearing Duragesic patch, questions  getting off patch  Weight changes, if any: stable  Nutritional Status a) intake: Ensure 2-3 cans daily, breeze 4-5 cans daily, no foods, tastes not normal b) using a feeding tube?: na c) weight changes, if any: no  Swallowing Status: "effort to swallow"; throat / tongue/mouth "still sore but better"  Dental (if applicable): When was last visit with dentistry  , next appt on 08/27/14  Using fluoride trays daily? yes   When was last ENT visit?   When is next ENT visit?  Next Wed with Dr [name], will see facial surgeon as well  Other notable issues, if any: energy level fluctuates but improving, dry mouth improving, skin behind right ear healed  BP 102/53 mmHg  Pulse 74  Temp(Src) 98.2 F (36.8 C) (Oral)  Resp 20  Wt 164 lb 6.4 oz (74.571 kg)

## 2014-08-25 ENCOUNTER — Ambulatory Visit (HOSPITAL_BASED_OUTPATIENT_CLINIC_OR_DEPARTMENT_OTHER): Payer: Medicare Other

## 2014-08-25 DIAGNOSIS — C089 Malignant neoplasm of major salivary gland, unspecified: Secondary | ICD-10-CM

## 2014-08-25 DIAGNOSIS — D72820 Lymphocytosis (symptomatic): Secondary | ICD-10-CM

## 2014-08-25 DIAGNOSIS — E860 Dehydration: Secondary | ICD-10-CM | POA: Diagnosis not present

## 2014-08-25 MED ORDER — SODIUM CHLORIDE 0.9 % IV SOLN
Freq: Once | INTRAVENOUS | Status: AC
  Administered 2014-08-25: 14:00:00 via INTRAVENOUS
  Filled 2014-08-25: qty 1000

## 2014-08-25 NOTE — Patient Instructions (Signed)
Dehydration, Adult Dehydration is when you lose more fluids from the body than you take in. Vital organs like the kidneys, brain, and heart cannot function without a proper amount of fluids and salt. Any loss of fluids from the body can cause dehydration.  CAUSES   Vomiting.  Diarrhea.  Excessive sweating.  Excessive urine output.  Fever. SYMPTOMS  Mild dehydration  Thirst.  Dry lips.  Slightly dry mouth. Moderate dehydration  Very dry mouth.  Sunken eyes.  Skin does not bounce back quickly when lightly pinched and released.  Dark urine and decreased urine production.  Decreased tear production.  Headache. Severe dehydration  Very dry mouth.  Extreme thirst.  Rapid, weak pulse (more than 100 beats per minute at rest).  Cold hands and feet.  Not able to sweat in spite of heat and temperature.  Rapid breathing.  Blue lips.  Confusion and lethargy.  Difficulty being awakened.  Minimal urine production.  No tears. DIAGNOSIS  Your caregiver will diagnose dehydration based on your symptoms and your exam. Blood and urine tests will help confirm the diagnosis. The diagnostic evaluation should also identify the cause of dehydration. TREATMENT  Treatment of mild or moderate dehydration can often be done at home by increasing the amount of fluids that you drink. It is best to drink small amounts of fluid more often. Drinking too much at one time can make vomiting worse. Refer to the home care instructions below. Severe dehydration needs to be treated at the hospital where you will probably be given intravenous (IV) fluids that contain water and electrolytes. HOME CARE INSTRUCTIONS   Ask your caregiver about specific rehydration instructions.  Drink enough fluids to keep your urine clear or pale yellow.  Drink small amounts frequently if you have nausea and vomiting.  Eat as you normally do.  Avoid:  Foods or drinks high in sugar.  Carbonated  drinks.  Juice.  Extremely hot or cold fluids.  Drinks with caffeine.  Fatty, greasy foods.  Alcohol.  Tobacco.  Overeating.  Gelatin desserts.  Wash your hands well to avoid spreading bacteria and viruses.  Only take over-the-counter or prescription medicines for pain, discomfort, or fever as directed by your caregiver.  Ask your caregiver if you should continue all prescribed and over-the-counter medicines.  Keep all follow-up appointments with your caregiver. SEEK MEDICAL CARE IF:  You have abdominal pain and it increases or stays in one area (localizes).  You have a rash, stiff neck, or severe headache.  You are irritable, sleepy, or difficult to awaken.  You are weak, dizzy, or extremely thirsty. SEEK IMMEDIATE MEDICAL CARE IF:   You are unable to keep fluids down or you get worse despite treatment.  You have frequent episodes of vomiting or diarrhea.  You have blood or green matter (bile) in your vomit.  You have blood in your stool or your stool looks black and tarry.  You have not urinated in 6 to 8 hours, or you have only urinated a small amount of very dark urine.  You have a fever.  You faint. MAKE SURE YOU:   Understand these instructions.  Will watch your condition.  Will get help right away if you are not doing well or get worse. Document Released: 05/23/2005 Document Revised: 08/15/2011 Document Reviewed: 01/10/2011 ExitCare Patient Information 2015 [employer]. This information is not intended to replace advice given to you by your health care provider. Make sure you discuss any questions you have with your health care   provider.  

## 2014-08-26 DIAGNOSIS — C4442 Squamous cell carcinoma of skin of scalp and neck: Secondary | ICD-10-CM | POA: Diagnosis not present

## 2014-08-26 DIAGNOSIS — C4402 Squamous cell carcinoma of skin of lip: Secondary | ICD-10-CM | POA: Diagnosis not present

## 2014-08-26 DIAGNOSIS — Z85828 Personal history of other malignant neoplasm of skin: Secondary | ICD-10-CM | POA: Diagnosis not present

## 2014-08-26 DIAGNOSIS — L570 Actinic keratosis: Secondary | ICD-10-CM | POA: Diagnosis not present

## 2014-08-26 DIAGNOSIS — D044 Carcinoma in situ of skin of scalp and neck: Secondary | ICD-10-CM | POA: Diagnosis not present

## 2014-08-26 DIAGNOSIS — L309 Dermatitis, unspecified: Secondary | ICD-10-CM | POA: Diagnosis not present

## 2014-08-26 DIAGNOSIS — C7989 Secondary malignant neoplasm of other specified sites: Secondary | ICD-10-CM | POA: Diagnosis not present

## 2014-08-27 ENCOUNTER — Ambulatory Visit (HOSPITAL_COMMUNITY): Payer: Self-pay | Admitting: Dentistry

## 2014-08-27 ENCOUNTER — Encounter (HOSPITAL_COMMUNITY): Payer: Self-pay | Admitting: Dentistry

## 2014-08-27 ENCOUNTER — Telehealth: Payer: Self-pay | Admitting: *Deleted

## 2014-08-27 VITALS — BP 116/56 | HR 61 | Temp 97.6°F | Wt 164.0 lb

## 2014-08-27 DIAGNOSIS — R1310 Dysphagia, unspecified: Secondary | ICD-10-CM

## 2014-08-27 DIAGNOSIS — C07 Malignant neoplasm of parotid gland: Secondary | ICD-10-CM | POA: Diagnosis not present

## 2014-08-27 DIAGNOSIS — K117 Disturbances of salivary secretion: Secondary | ICD-10-CM

## 2014-08-27 DIAGNOSIS — C4440 Unspecified malignant neoplasm of skin of scalp and neck: Secondary | ICD-10-CM

## 2014-08-27 DIAGNOSIS — R432 Parageusia: Secondary | ICD-10-CM

## 2014-08-27 DIAGNOSIS — H02103 Unspecified ectropion of right eye, unspecified eyelid: Secondary | ICD-10-CM | POA: Diagnosis not present

## 2014-08-27 DIAGNOSIS — C770 Secondary and unspecified malignant neoplasm of lymph nodes of head, face and neck: Secondary | ICD-10-CM | POA: Diagnosis not present

## 2014-08-27 DIAGNOSIS — Z0189 Encounter for other specified special examinations: Secondary | ICD-10-CM

## 2014-08-27 DIAGNOSIS — R682 Dry mouth, unspecified: Secondary | ICD-10-CM

## 2014-08-27 DIAGNOSIS — Y842 Radiological procedure and radiotherapy as the cause of abnormal reaction of the patient, or of later complication, without mention of misadventure at the time of the procedure: Secondary | ICD-10-CM

## 2014-08-27 DIAGNOSIS — K1233 Oral mucositis (ulcerative) due to radiation: Secondary | ICD-10-CM

## 2014-08-27 DIAGNOSIS — Z923 Personal history of irradiation: Secondary | ICD-10-CM

## 2014-08-27 DIAGNOSIS — G510 Bell's palsy: Secondary | ICD-10-CM | POA: Diagnosis not present

## 2014-08-27 DIAGNOSIS — Z08 Encounter for follow-up examination after completed treatment for malignant neoplasm: Secondary | ICD-10-CM | POA: Diagnosis not present

## 2014-08-27 MED ORDER — SODIUM FLUORIDE 1.1 % DT CREA: TOPICAL_CREAM | DENTAL | Status: DC

## 2014-08-27 NOTE — Progress Notes (Signed)
08/27/2014  Patient Name:   Adrian West Date of Birth:   April 21, 1928 Medical Record Number: [id number]  BP 116/56 mmHg  Pulse 61  Temp(Src) 97.6 F (36.4 C) (Oral)  Wt 164 lb (74.39 kg)   [name] presents for oral examination after radiation therapy. Patient completed radiation treatments from 06/11/2014 through 07/22/2014.  REVIEW OF CHIEF COMPLAINTS: DRY MOUTH: Yes, very dry. HARD TO SWALLOW: Yes. HURT TO SWALLOW: Yes TASTE CHANGES: Still have minimal taste and no appetite. SORES IN MOUTH: Not really. TRISMUS: No trismus symptoms. WEIGHT: 164 lbs.  HOME OH REGIMEN:  BRUSHING: Twice a day  FLOSSING: Daily RINSING: Using Biotene rinses and salt water and baking soda rinses as well. FLUORIDE: He is using fluoride in fluoride trays at bedtime. Requested prescription for PreviDent 5000 for use on an alternating basis with the fluoride trays.  TRISMUS EXERCISES:  Maximum interincisal opening: 35 mm   DENTAL EXAM:  Oral Hygiene:(PLAQUE): Plaque was noted. Oral hygiene improvement was highly recommended.  LOCATION OF MUCOSITIS: Left and right buccal mucosa. DESCRIPTION OF SALIVA: Moderate to severe xerostomia. ANY EXPOSED BONE: None noted OTHER WATCHED AREAS: Impacted  #32.  DX: Xerostomia, Dysgeusia, Dysphagia, Odynophagia, Accretions and Mucositis  RECOMMENDATIONS: 1. Brush after meals and at bedtime.  Fluoride in fluoride trays at bedtime for 5 minutes alternating with brushing with PreviDent 5000 with a toothbrush at bedtime. 2. Use trismus exercises as directed. 3. Use salt water/baking soda rinses. Use one half teaspoon of salt and 1/2 teaspoon a baking soda per 12 ounces of water. 4. Multiple sips of water as needed. 5. Return to primary Dentist -Dr. Ennis Forts for oral exam after radiation therapy alternating with periodontist, Dr. Satira Sark.   Lenn Cal, DDS

## 2014-08-27 NOTE — Telephone Encounter (Signed)
Called patient to discuss pain mgt for improved swallowing.  His wife said he was sleeping, she would have him call me in the morning.  She confirmed he has not reduced dosage of Fentanyl per his visit with Dr. Isidore Moos las week.  Gayleen Orem, RN, BSN, [facility] [place] [phone]

## 2014-08-27 NOTE — Patient Instructions (Signed)
RADIATION THERAPY AND DECISIONS REGARDING YOUR TEETH  Xerostomia (dry mouth) Your salivary glands may be in the filed of radiation.  Radiation may include all or part of your saliva glands.  This will cause your saliva to dry up and you will have a dry mouth.  The dry mouth will be for the rest of your life unless your radiation oncologist tells you otherwise.  Your saliva has many functions:  Saliva wets your tongue for speaking.  It coats your teeth and the inside of your mouth for easier movement.  It helps with chewing and swallowing food.  It helps clean away harmful acid and toxic products made by the germs in your mouth, therefore it helps prevent cavities.  It kills some germs in your mouth and helps to prevent gum disease.  It helps to carry flavor to your taste buds.  Once you have lost your saliva you will be at higher risk for tooth decay and gum disease.  What can be done to help improve your mouth when there's not enough saliva:  1.  Your dentist may give a prescription for Salagen.  It will not bring back all of your saliva but may bring back some of it.  Also your saliva may be thick and ropy or white and foamy. It will not feel like it use to feel.  2.  You will need to swish with water every time your mouth feels dry.  YOU CANNOT suck on any cough drops, mints, lemon drops, candy, vitamin C or any other products.  You cannot use anything other than water to make your mouth feel less dry.  If you want to drink anything else you have to drink it all at once and brush afterwards.  Be sure to discuss the details of your diet habits with your dentist or hygienist.  Radiation caries: This is decay that happens very quickly once your mouth is very dry due to radiation therapy.  Normally cavities take six months to two years to become a problem.  When you have dry mouth cavities may take as little as eight weeks to cause you a problem.  This is why dental check ups every two  months are necessary as long as you have a dry mouth. Radiation caries typically, but not always, start at your gum line where it is hard to see the cavity.  It is therefore also hard to fill these cavities adequately.  This high rate of cavities happens because your mouth no longer has saliva and therefore the acid made by the germs starts the decay process.  Whenever you eat anything the germs in your mouth change the food into acid.  The acid then burns a small hole in your tooth.  This small hole is the beginning of a cavity.  If this is not treated then it will grow bigger and become a cavity.  The way to avoid this hole getting bigger is to use fluoride every evening as prescribed by your dentist.  You have to make sure that your teeth are very clean before you use the fluoride.  This fluoride in turn will strengthen your teeth and prepare them for another day of fighting acid.  If you develop radiation caries many times the damage is so large that you will have to have all your teeth removed.  This could be a big problem if some of these teeth are in the field of radiation.  Further details of why this could be   a big problem will follow.  (See Osteoradionecrosis).  Loss of taste (dysgeusia) This happens to varying degrees once you've had radiation therapy to your jaw region.  Many times taste is not completely lost but becomes limited.  The loss of taste is mostly due to radiation affecting your taste buds.  However if you have no saliva in your mouth to carry the flavor to your taste buds it would be difficult for your taste buds to taste anything.  That is why using water or a prescription for Salagen prior to meals and during meals may help with some of the taste.  Keep in mind that taste generally returns very slowly over the course of several months or several years after radiation therapy.  Don't give up hope.  Trismus According to your Radiation Oncologist your TMJ or jaw joints are going to be  partially or fully in the field of radiation.  This means that over time the muscles that help you open and close your mouth may get stiff.  This will potentially result in your not being able to open your mouth wide enough or as wide as you can open it now.  Le me give you an example of how slowly this happens and how unaware people are of it.  A gentlemen that had radiation therapy two years ago came back to me complaining that bananas are just too large for him to be able to fit them in between his teeth.  He was not able to open wide enough to bite into a banana.  This happens slowly and over a period of time.  What do we do to try and prevent this?  Your dentist will probably give you a stack of sticks called a trismus exercise device .  This stack will help your remind your muscles and your jaw joint to open up to the same distance every day.  Use these sticks every morning when you wake up according to the instructions given by the dentist.   You must use these sticks for at least one to two years after radiation therapy.  The reason for that is because it happens so slowly and keeps going on for about two years after radiation therapy.  Your hospital dentist will help you monitor your mouth opening and make sure that it's not getting smaller.  Osteoradionecrosis (ORN) This is a condition where your jaw bone after having had radiation therapy becomes very dry.  It has very little blood supply to keep it alive.  If you develop a cavity that turns into an abscess or an infection then the jaw bone does not have enough blood supply to help fight the infection.  At this point it is very likely that the infection could cause the death of your jaw bone.  When you have dead bone it has to be removed.  Therefore you might end up having to have surgery to remove part of your jaw bone, the part of the jaw bone that has been affected.   Healing is also a problem if you are to have surgery in the areas where the bone  has had radiation therapy.  The same reasons apply.  If you have surgery you need more blood supply which is not available.  When blood supply and oxygen are not available again, there is a chance for the bone to die.  Occasionally ORN happens on its own with no obvious reason.  This is quite rare.  We believe that   patients who continue to smoke and/or drink alcohol have a higher chance of having this bone problem.  Therefore once your jaw bone has had radiation therapy if there are any teeth in that area, you should never have them pulled.  You should also never have any surgery on your teeth or gums in that area unless the oral surgeon or Periodontist is aware of your history of radiation. There is some expensive management techniques that might be used to limit your risks.  The risks for ORN either from infection or spontaneous ( or on it's own) are life long.      FLUORIDE TRAYS PATIENT INSTRUCTIONS    Obtain prescription from the pharmacy.  Don't be surprised if it needs to be ordered.  Be sure to let the pharmacy know when you are close to needing a new refill for them to have it ready for you without interruption of Fluoride use.  The best time to use your Fluoride is before bed time.  You must brush your teeth very well and floss before using the Fluoride in order to get the best use out of the Fluoride treatments.  Place 1 drop of Fluoride gel per tooth in the tray.  Place the tray on your lower teeth and your upper teeth.  Make sure the trays are seated all the way.  Remember, they only fit one way on your teeth.  Insert for 5 full minutes.  At the end of the 5 minutes, take the trays out.  SPIT OUT excess. .  Do NOT rinse your mouth!  Do NOT eat or drink after treatments for at least 30 minutes.  This is why the best time for your treatments is before bedtime.  Clean the inside of your Fluoride trays using COLD WATER and a toothbrush.  In order to keep your Trays from  discoloring and free from odors, soak them overnight in denture cleaners such as Efferdent.  Do not use bleach or non denture products.  Store the trays in a safe dry place AWAY from any heat until your next treatment.  If anything happens to your Fluoride trays, or they don't fit as well after any dental work, please let us know as soon as possible.  TRISMUS  Trismus is a condition where the jaw does not allow the mouth to open as wide as it usually does.  This can happen almost suddenly, or in other cases the process is so slow, it is hard to notice it-until it is too far along.  When the jaw joints and/or muscles have been exposed to radiation treatments, the onset of Trismus is very slow.  This is because the muscles are losing their stretching ability over a long period of time, as long as 2 YEARS after the end of radiation.  It is therefore important to exercise these muscles and joints.  TRISMUS EXERCISES   Stack of tongue depressors measuring the same or a little less than the last documented MIO (Maximum Interincisal Opening).  Secure them with a rubber band on both ends.  Place the stack in the patient's mouth, supporting the other end.  Allow 30 seconds for muscle stretching.  Rest for a few seconds.  Repeat 3-5 times  For all radiation patients, this exercise is recommended in the mornings and evenings unless otherwise instructed.  The exercise should be done for a period of 2 YEARS after the end of radiation.  MIO should be checked routinely on recall dental visits by   the general dentist or the hospital dentist.  The patient is advised to report any changes, soreness, or difficulties encountered when doing the exercises. 

## 2014-08-28 ENCOUNTER — Emergency Department (HOSPITAL_COMMUNITY)
Admission: EM | Admit: 2014-08-28 | Discharge: 2014-08-28 | Disposition: A | Discharge status: Home / Self Care | Payer: Medicare Other | Attending: Emergency Medicine | Admitting: Emergency Medicine

## 2014-08-28 ENCOUNTER — Encounter (HOSPITAL_COMMUNITY): Payer: Self-pay | Admitting: Emergency Medicine

## 2014-08-28 DIAGNOSIS — Z7902 Long term (current) use of antithrombotics/antiplatelets: Secondary | ICD-10-CM | POA: Diagnosis not present

## 2014-08-28 DIAGNOSIS — Z792 Long term (current) use of antibiotics: Secondary | ICD-10-CM | POA: Diagnosis not present

## 2014-08-28 DIAGNOSIS — Z85038 Personal history of other malignant neoplasm of large intestine: Secondary | ICD-10-CM | POA: Diagnosis not present

## 2014-08-28 DIAGNOSIS — S0512XA Contusion of eyeball and orbital tissues, left eye, initial encounter: Secondary | ICD-10-CM | POA: Diagnosis not present

## 2014-08-28 DIAGNOSIS — Z7982 Long term (current) use of aspirin: Secondary | ICD-10-CM | POA: Insufficient documentation

## 2014-08-28 DIAGNOSIS — Y9389 Activity, other specified: Secondary | ICD-10-CM | POA: Insufficient documentation

## 2014-08-28 DIAGNOSIS — K219 Gastro-esophageal reflux disease without esophagitis: Secondary | ICD-10-CM | POA: Diagnosis not present

## 2014-08-28 DIAGNOSIS — Z79899 Other long term (current) drug therapy: Secondary | ICD-10-CM | POA: Insufficient documentation

## 2014-08-28 DIAGNOSIS — Z8669 Personal history of other diseases of the nervous system and sense organs: Secondary | ICD-10-CM | POA: Diagnosis not present

## 2014-08-28 DIAGNOSIS — Y998 Other external cause status: Secondary | ICD-10-CM | POA: Insufficient documentation

## 2014-08-28 DIAGNOSIS — E785 Hyperlipidemia, unspecified: Secondary | ICD-10-CM | POA: Insufficient documentation

## 2014-08-28 DIAGNOSIS — I10 Essential (primary) hypertension: Secondary | ICD-10-CM | POA: Insufficient documentation

## 2014-08-28 DIAGNOSIS — S0993XA Unspecified injury of face, initial encounter: Secondary | ICD-10-CM | POA: Diagnosis not present

## 2014-08-28 DIAGNOSIS — E039 Hypothyroidism, unspecified: Secondary | ICD-10-CM | POA: Insufficient documentation

## 2014-08-28 DIAGNOSIS — Z85828 Personal history of other malignant neoplasm of skin: Secondary | ICD-10-CM | POA: Insufficient documentation

## 2014-08-28 DIAGNOSIS — H9190 Unspecified hearing loss, unspecified ear: Secondary | ICD-10-CM | POA: Insufficient documentation

## 2014-08-28 DIAGNOSIS — Y9289 Other specified places as the place of occurrence of the external cause: Secondary | ICD-10-CM | POA: Insufficient documentation

## 2014-08-28 DIAGNOSIS — T07 Unspecified multiple injuries: Secondary | ICD-10-CM | POA: Diagnosis not present
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
## 2014-08-28 NOTE — Discharge Instructions (Signed)
Assault, General °Assault includes any behavior, whether intentional or reckless, which results in bodily injury to another person and/or damage to property. Included in this would be any behavior, intentional or reckless, that by its nature would be understood (interpreted) by a reasonable person as intent to harm another person or to damage his/her property. Threats may be oral or written. They may be communicated through regular mail, computer, fax, or phone. These threats may be direct or implied. °FORMS OF ASSAULT INCLUDE: °· Physically assaulting a person. This includes physical threats to inflict physical harm as well as: °¨ Slapping. °¨ Hitting. °¨ Poking. °¨ Kicking. °¨ Punching. °¨ Pushing. °· Arson. °· Sabotage. °· Equipment vandalism. °· Damaging or destroying property. °· Throwing or hitting objects. °· Displaying a weapon or an object that appears to be a weapon in a threatening manner. °¨ Carrying a firearm of any kind. °¨ Using a weapon to harm someone. °· Using greater physical size/strength to intimidate another. °¨ Making intimidating or threatening gestures. °¨ Bullying. °¨ Hazing. °· Intimidating, threatening, hostile, or abusive language directed toward another person. °¨ It communicates the intention to engage in violence against that person. And it leads a reasonable person to expect that violent behavior may occur. °· Stalking another person. °IF IT HAPPENS AGAIN: °· Immediately call for emergency help (911 in [place] °· If someone poses clear and immediate danger to you, seek legal authorities to have a protective or restraining order put in place. °· Less threatening assaults can at least be reported to authorities. °STEPS TO TAKE IF A SEXUAL ASSAULT HAS HAPPENED °· Go to an area of safety. This may include a shelter or staying with a friend. Stay away from the area where you have been attacked. A large percentage of sexual assaults are caused by a friend, relative or associate. °· If  medications were given by your caregiver, take them as directed for the full length of time prescribed. °· Only take over-the-counter or prescription medicines for pain, discomfort, or fever as directed by your caregiver. °· If you have come in contact with a sexual disease, find out if you are to be tested again. If your caregiver is concerned about the HIV/AIDS virus, he/she may require you to have continued testing for several months. °· For the protection of your privacy, test results can not be given over the phone. Make sure you receive the results of your test. If your test results are not back during your visit, make an appointment with your caregiver to find out the results. Do not assume everything is normal if you have not heard from your caregiver or the medical facility. It is important for you to follow up on all of your test results. °· File appropriate papers with authorities. This is important in all assaults, even if it has occurred in a family or by a friend. °SEEK MEDICAL CARE IF: °· You have new problems because of your injuries. °· You have problems that may be because of the medicine you are taking, such as: °¨ Rash. °¨ Itching. °¨ Swelling. °¨ Trouble breathing. °· You develop belly (abdominal) pain, feel sick to your stomach (nausea) or are vomiting. °· You begin to run a temperature. °· You need supportive care or referral to a rape crisis center. These are centers with trained personnel who can help you get through this ordeal. °SEEK IMMEDIATE MEDICAL CARE IF: °· You are afraid of being threatened, beaten, or abused. In [place] call 911. °· You   receive new injuries related to abuse.  You develop severe pain in any area injured in the assault or have any change in your condition that concerns you.  You faint or lose consciousness.  You develop chest pain or shortness of breath. Document Released: 05/23/2005 Document Revised: 08/15/2011 Document Reviewed: 01/09/2008 [facility] Patient  Information 2015 [place], [place]. This information is not intended to replace advice given to you by your health care provider. Make sure you discuss any questions you have with your health care provider. At the time of your examination today.  You have no apparent injuries.  Please return immediately if he developed new or worsening symptoms or become concerned about your overall health in general

## 2014-08-28 NOTE — ED Notes (Signed)
Aline Brochure, MD at bedside for evaluation.
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
## 2014-08-29 ENCOUNTER — Ambulatory Visit
Admission: RE | Admit: 2014-08-29 | Discharge: 2014-08-29 | Disposition: A | Discharge status: Home / Self Care | Payer: Medicare Other | Source: Ambulatory Visit | Attending: Radiation Oncology | Admitting: Radiation Oncology

## 2014-08-29 ENCOUNTER — Telehealth: Payer: Self-pay | Admitting: *Deleted

## 2014-08-29 ENCOUNTER — Encounter: Payer: Self-pay | Admitting: Radiation Oncology

## 2014-08-29 ENCOUNTER — Ambulatory Visit: Payer: Medicare Other

## 2014-08-29 ENCOUNTER — Encounter: Payer: Self-pay | Admitting: *Deleted

## 2014-08-29 VITALS — BP 111/67 | HR 69 | Resp 20 | Wt 164.0 lb

## 2014-08-29 DIAGNOSIS — C4440 Unspecified malignant neoplasm of skin of scalp and neck: Secondary | ICD-10-CM

## 2014-08-29 DIAGNOSIS — E039 Hypothyroidism, unspecified: Secondary | ICD-10-CM

## 2014-08-29 DIAGNOSIS — C770 Secondary and unspecified malignant neoplasm of lymph nodes of head, face and neck: Secondary | ICD-10-CM

## 2014-08-29 LAB — BASIC METABOLIC PANEL (CC13)
Anion Gap: 9 mEq/L (ref 3–11)
BUN: 15.9 mg/dL (ref 7.0–26.0)
CALCIUM: 8.7 mg/dL (ref 8.4–10.4)
CO2: 26 meq/L (ref 22–29)
Chloride: 106 mEq/L (ref 98–109)
Creatinine: 0.7 mg/dL (ref 0.7–1.3)
EGFR: 85 mL/min/{1.73_m2} — ABNORMAL LOW (ref >90)
Glucose: 85 mg/dl (ref 70–140)
Potassium: 3 mEq/L — CL (ref 3.5–5.1)
Sodium: 141 mEq/L (ref 136–145)

## 2014-08-29 LAB — TSH CHCC: TSH: 0.182 m(IU)/L — ABNORMAL LOW (ref 0.320–4.118)

## 2014-08-29 MED ORDER — FENTANYL 25 MCG/HR TD PT72: 25.0000 ug | MEDICATED_PATCH | TRANSDERMAL | Status: DC

## 2014-08-29 MED ORDER — SODIUM CHLORIDE 0.9 % IV SOLN
Freq: Once | INTRAVENOUS | Status: AC
  Administered 2014-08-29: 15:00:00 via INTRAVENOUS
  Filled 2014-08-29: qty 1000

## 2014-08-29 MED ORDER — POTASSIUM CHLORIDE 20 MEQ/15ML (10%) PO SOLN: 20.0000 meq | Freq: Four times a day (QID) | ORAL | Status: DC

## 2014-08-29 NOTE — Progress Notes (Signed)
Pain Status: none, wearing Fentanyl patch 37.5 mg, not taking Hydrocodone  Weight changes, if any:   Nutritional Status a) intake: Ensure/Breeze 6 cans daily, trying to increase to 9 cans daily, not eating any foods but does try soft foods occasionally b) using a feeding tube?: na c) weight changes, if any: lost 6 oz in past week  Swallowing Status: dry mouth, had swallowing study   Dental (if applicable): When was last visit with dentistry  08/27/14 Dr [name]  Using fluoride trays daily?  yes   When was last ENT visit?   Dr [name] When is next ENT visit?  10/22/14 Dr [name]  Other notable issues, if any: will see Dr Lottie Rater for right eye surgery, had to cancel appt with him for today
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
## 2014-08-29 NOTE — Telephone Encounter (Signed)
Returned yesterday afternoon's VM left by Dorita Sciara of Nursing, [facility].    She informed that Mr. Loken will be keeping his appts this afternoon, will be likely brought by his dtr, Patty.    She confirmed understanding of 12:30 labs, 1:00 f/u with Dr. Isidore Moos, 1:30 IVF.     I notified Dr. Isidore Moos and [facility].  Gayleen Orem, RN, BSN, [place] at [place] [phone]

## 2014-08-29 NOTE — Patient Instructions (Signed)
Dehydration, Adult Dehydration is when you lose more fluids from the body than you take in. Vital organs like the kidneys, brain, and heart cannot function without a proper amount of fluids and salt. Any loss of fluids from the body can cause dehydration.  CAUSES   Vomiting.  Diarrhea.  Excessive sweating.  Excessive urine output.  Fever. SYMPTOMS  Mild dehydration  Thirst.  Dry lips.  Slightly dry mouth. Moderate dehydration  Very dry mouth.  Sunken eyes.  Skin does not bounce back quickly when lightly pinched and released.  Dark urine and decreased urine production.  Decreased tear production.  Headache. Severe dehydration  Very dry mouth.  Extreme thirst.  Rapid, weak pulse (more than 100 beats per minute at rest).  Cold hands and feet.  Not able to sweat in spite of heat and temperature.  Rapid breathing.  Blue lips.  Confusion and lethargy.  Difficulty being awakened.  Minimal urine production.  No tears. DIAGNOSIS  Your caregiver will diagnose dehydration based on your symptoms and your exam. Blood and urine tests will help confirm the diagnosis. The diagnostic evaluation should also identify the cause of dehydration. TREATMENT  Treatment of mild or moderate dehydration can often be done at home by increasing the amount of fluids that you drink. It is best to drink small amounts of fluid more often. Drinking too much at one time can make vomiting worse. Refer to the home care instructions below. Severe dehydration needs to be treated at the hospital where you will probably be given intravenous (IV) fluids that contain water and electrolytes. HOME CARE INSTRUCTIONS   Ask your caregiver about specific rehydration instructions.  Drink enough fluids to keep your urine clear or pale yellow.  Drink small amounts frequently if you have nausea and vomiting.  Eat as you normally do.  Avoid:  Foods or drinks high in sugar.  Carbonated  drinks.  Juice.  Extremely hot or cold fluids.  Drinks with caffeine.  Fatty, greasy foods.  Alcohol.  Tobacco.  Overeating.  Gelatin desserts.  Wash your hands well to avoid spreading bacteria and viruses.  Only take over-the-counter or prescription medicines for pain, discomfort, or fever as directed by your caregiver.  Ask your caregiver if you should continue all prescribed and over-the-counter medicines.  Keep all follow-up appointments with your caregiver. SEEK MEDICAL CARE IF:  You have abdominal pain and it increases or stays in one area (localizes).  You have a rash, stiff neck, or severe headache.  You are irritable, sleepy, or difficult to awaken.  You are weak, dizzy, or extremely thirsty. SEEK IMMEDIATE MEDICAL CARE IF:   You are unable to keep fluids down or you get worse despite treatment.  You have frequent episodes of vomiting or diarrhea.  You have blood or green matter (bile) in your vomit.  You have blood in your stool or your stool looks black and tarry.  You have not urinated in 6 to 8 hours, or you have only urinated a small amount of very dark urine.  You have a fever.  You faint. MAKE SURE YOU:   Understand these instructions.  Will watch your condition.  Will get help right away if you are not doing well or get worse. Document Released: 05/23/2005 Document Revised: 08/15/2011 Document Reviewed: 01/10/2011 ExitCare Patient Information 2015 [employer]. This information is not intended to replace advice given to you by your health care provider. Make sure you discuss any questions you have with your health care   provider.  

## 2014-08-30 ENCOUNTER — Encounter: Payer: Self-pay | Admitting: *Deleted

## 2014-08-30 NOTE — Progress Notes (Addendum)
Friday 08/29/14 - Visited patient's wife at [facility] following her admission yesterday to provide support and comfort.  Gayleen Orem, RN, BSN, [place] at [place] [phone]
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
## 2014-09-01 ENCOUNTER — Telehealth: Payer: Self-pay | Admitting: *Deleted

## 2014-09-01 NOTE — Telephone Encounter (Signed)
1. Patient called to report:  Oral intake this weekend included Cherrios and Rice Krispies with milk for breakfasts,  shrimp salad and lamb for Sunday lunch, coffee, sweet tea.  He further stated that these items actually tasted like they should! 2. He denied any intolerable throat/tongue pain.  He is continuing with the 37 mcg fentanyl, applying the last one tomorrow, after which he plans to transition to the 25 mcg. 3. We received notes provided by Dr. Isidore Moos last Friday, he stated he is complying.  Gayleen Orem, RN, BSN, [place] at [place] [phone]

## 2014-09-02 ENCOUNTER — Other Ambulatory Visit: Payer: Self-pay | Admitting: Hematology and Oncology

## 2014-09-03 DIAGNOSIS — C4402 Squamous cell carcinoma of skin of lip: Secondary | ICD-10-CM | POA: Diagnosis not present

## 2014-09-03 DIAGNOSIS — C7989 Secondary malignant neoplasm of other specified sites: Secondary | ICD-10-CM | POA: Diagnosis not present

## 2014-09-03 NOTE — Addendum Note (Signed)
Encounter addended by: [name], MD on: 09/03/2014  9:42 AM<BR>     Documentation filed: Clinical Notes

## 2014-09-05 ENCOUNTER — Telehealth: Payer: Self-pay | Admitting: *Deleted

## 2014-09-05 ENCOUNTER — Ambulatory Visit
Admission: RE | Admit: 2014-09-05 | Discharge: 2014-09-05 | Disposition: A | Discharge status: Home / Self Care | Payer: Medicare Other | Source: Ambulatory Visit | Attending: Radiation Oncology | Admitting: Radiation Oncology

## 2014-09-05 ENCOUNTER — Encounter: Payer: Self-pay | Admitting: Radiation Oncology

## 2014-09-05 VITALS — BP 97/48 | HR 58 | Temp 97.5°F | Resp 20 | Wt 165.3 lb

## 2014-09-05 DIAGNOSIS — C770 Secondary and unspecified malignant neoplasm of lymph nodes of head, face and neck: Secondary | ICD-10-CM

## 2014-09-05 DIAGNOSIS — C4440 Unspecified malignant neoplasm of skin of scalp and neck: Secondary | ICD-10-CM

## 2014-09-05 LAB — BASIC METABOLIC PANEL (CC13)
Anion Gap: 9 mEq/L (ref 3–11)
BUN: 11.7 mg/dL (ref 7.0–26.0)
CALCIUM: 8.9 mg/dL (ref 8.4–10.4)
CO2: 25 mEq/L (ref 22–29)
CREATININE: 0.7 mg/dL (ref 0.7–1.3)
Chloride: 107 mEq/L (ref 98–109)
EGFR: 84 mL/min/{1.73_m2} — ABNORMAL LOW (ref >90)
Glucose: 99 mg/dl (ref 70–140)
Potassium: 4.3 mEq/L (ref 3.5–5.1)
SODIUM: 141 meq/L (ref 136–145)

## 2014-09-05 NOTE — Telephone Encounter (Signed)
CALLED PATIENT TO INFORM OF APPTS. FOR 09-22-14, LVM FOR A RETURN CALL

## 2014-09-05 NOTE — Progress Notes (Signed)
Patient denies pain, dizziness. He states he is drinking average of 6 cans of Breeze, Ensure daily, drinking 4 glasses of water. He is eating cereal with milk daily. He continues to try soft foods.  He reports that his thick saliva is decreasing, his energy level is better, and he no longer has mouth, throat, tongue pain. He states he is wearing the "last" 37.5 mcg Duragesic patches today. He states he will switch to only 25 mcg patches today.   He has new glasses which have helped his vision. He c/o tearing of right eye.  He states he will reschedule appointment with Dr Lottie Rater for surgery on his left eye. He c/o decreased hearing in his left ear, has a new hearing aid. His left eye bruising is improving, less facial swelling . He is taking potassium, Zinc as ordered.  BP 97/48 mmHg  Pulse 58  Temp(Src) 97.5 F (36.4 C) (Oral)  Resp 20  Wt 165 lb 4.8 oz (74.98 kg)  Standing  Sitting  HR 69  BP 115/67
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
## 2014-09-05 NOTE — Telephone Encounter (Signed)
Per Dr Isidore Moos, spoke with patient and informed him his lab results from today are very good. Per Dr Isidore Moos, instructed him to continue taking Potassium until he sees her again for FU. Mr Schubach verbalized understanding and agreement.

## 2014-09-08 ENCOUNTER — Telehealth: Payer: Self-pay | Admitting: *Deleted

## 2014-09-08 NOTE — Telephone Encounter (Signed)
Called patient in follow-up to his VM of yesterday afternoon in which he indicated his energy "went downhill" over the weekend and that he thinks he may need IVF.  When we spoke, he indicated he was "feeling much better", reported that he continues to eat and drink as previously reported.  He said he would call me later today with an update on how he is feeling.  Gayleen Orem, RN, BSN, [place] at [place] [phone]

## 2014-09-10 ENCOUNTER — Telehealth: Payer: Self-pay | Admitting: *Deleted

## 2014-09-10 ENCOUNTER — Encounter: Payer: Self-pay | Admitting: Radiation Oncology

## 2014-09-10 ENCOUNTER — Ambulatory Visit (HOSPITAL_BASED_OUTPATIENT_CLINIC_OR_DEPARTMENT_OTHER): Payer: Medicare Other

## 2014-09-10 ENCOUNTER — Other Ambulatory Visit: Payer: Self-pay | Admitting: Radiation Oncology

## 2014-09-10 ENCOUNTER — Ambulatory Visit
Admission: RE | Admit: 2014-09-10 | Discharge: 2014-09-10 | Disposition: A | Discharge status: Home / Self Care | Payer: Medicare Other | Source: Ambulatory Visit | Attending: Radiation Oncology | Admitting: Radiation Oncology

## 2014-09-10 VITALS — BP 108/57 | HR 65 | Temp 97.0°F | Resp 18

## 2014-09-10 DIAGNOSIS — E038 Other specified hypothyroidism: Secondary | ICD-10-CM | POA: Diagnosis not present

## 2014-09-10 DIAGNOSIS — C4440 Unspecified malignant neoplasm of skin of scalp and neck: Secondary | ICD-10-CM

## 2014-09-10 DIAGNOSIS — C4441 Basal cell carcinoma of skin of scalp and neck: Secondary | ICD-10-CM

## 2014-09-10 DIAGNOSIS — E018 Other iodine-deficiency related thyroid disorders and allied conditions: Secondary | ICD-10-CM | POA: Diagnosis not present

## 2014-09-10 LAB — BASIC METABOLIC PANEL (CC13)
ANION GAP: 11 meq/L (ref 3–11)
BUN: 17.6 mg/dL (ref 7.0–26.0)
CALCIUM: 9.1 mg/dL (ref 8.4–10.4)
CO2: 23 meq/L (ref 22–29)
CREATININE: 0.7 mg/dL (ref 0.7–1.3)
Chloride: 105 mEq/L (ref 98–109)
EGFR: 85 mL/min/{1.73_m2} — ABNORMAL LOW (ref >90)
Glucose: 95 mg/dl (ref 70–140)
Potassium: 3.9 mEq/L (ref 3.5–5.1)
Sodium: 140 mEq/L (ref 136–145)

## 2014-09-10 LAB — CBC WITH DIFFERENTIAL/PLATELET
BASO%: 0.2 % (ref 0.0–2.0)
Basophils Absolute: 0 10*3/uL (ref 0.0–0.1)
EOS%: 3.2 % (ref 0.0–7.0)
Eosinophils Absolute: 0.1 10*3/uL (ref 0.0–0.5)
HCT: 38.7 % (ref 38.4–49.9)
HGB: 12.6 g/dL — ABNORMAL LOW (ref 13.0–17.1)
LYMPH%: 14 % (ref 14.0–49.0)
MCH: 27.7 pg (ref 27.2–33.4)
MCHC: 32.6 g/dL (ref 32.0–36.0)
MCV: 85.1 fL (ref 79.3–98.0)
MONO#: 0.3 10*3/uL (ref 0.1–0.9)
MONO%: 6.1 % (ref 0.0–14.0)
NEUT#: 3.1 10*3/uL (ref 1.5–6.5)
NEUT%: 76.5 % — ABNORMAL HIGH (ref 39.0–75.0)
Platelets: 221 10*3/uL (ref 140–400)
RBC: 4.55 10*6/uL (ref 4.20–5.82)
RDW: 16.7 % — ABNORMAL HIGH (ref 11.0–14.6)
WBC: 4.1 10*3/uL (ref 4.0–10.3)
lymph#: 0.6 10*3/uL — ABNORMAL LOW (ref 0.9–3.3)

## 2014-09-10 LAB — T4, FREE: FREE T4: 1.27 ng/dL (ref 0.80–1.80)

## 2014-09-10 MED ORDER — SODIUM CHLORIDE 0.9 % IV SOLN
INTRAVENOUS | Status: DC
  Administered 2014-09-10: 15:00:00 via INTRAVENOUS
  Filled 2014-09-10: qty 1000

## 2014-09-10 NOTE — Telephone Encounter (Signed)
error 

## 2014-09-10 NOTE — Telephone Encounter (Signed)
Received VM from patient as well as his dtr [employer] stating patient is dehydrated, "has gone down hill" rapidly over last 48 hrs.  Dtr indicated urgent request for IVF today.  Patient further stated he has not had any energy, not been able to eat over the past day.  Care Team notified.  Gayleen Orem, RN, BSN, [place] at [place] [phone]

## 2014-09-10 NOTE — Telephone Encounter (Signed)
Returned call from E. Lopez, Therapist, sports at [employer] where patient and his wife reside. She states that she and another clinic RN, Manuela Schwartz agree that Adrian West needs a palliative consult. She states that his caregiver, ie: wife, has her own medical condition and cannot properly care for him. She is requesting Dr Isidore Moos write an order, and it be faxed to Clinic RN @ [phone]. Informed her would route her request to Dr Isidore Moos. Confirmed with Mliss Sax that Adrian West will be coming to [facility] today for IVF.  09/10/14 Faxed letter from Dr Isidore Moos requesting patient receive a palliative care consult or be referred back to Wadie Lessen, NP, palliative care through [facility].

## 2014-09-10 NOTE — Patient Instructions (Signed)

## 2014-09-10 NOTE — Telephone Encounter (Signed)
LVM for patient and his dtr that he has 1:00 lab and 1:30 IVF appts at [facility] today.  i asked that he call me to confirm message receipt.  Gayleen Orem, RN, BSN, [place] at [place] [phone]

## 2014-09-10 NOTE — Telephone Encounter (Signed)
Spoke with patient.  He verbalized understanding of today's appts.  Gayleen Orem, RN, BSN, [place] at [place] [phone]

## 2014-09-11 ENCOUNTER — Telehealth: Payer: Self-pay | Admitting: *Deleted

## 2014-09-11 NOTE — Telephone Encounter (Signed)
Patient called to ask if he is being scheduled for additional IVF subsequent to yesterday's infusion. 1. I noted that additional fluids are not scheduled at this time but I would notify Dr. Isidore Moos and follow-up with him. 2. He stated yesterday's fluids have made him feel somewhat better but he reported intermittent dizziness. 3. I encouraged him to contact the RN at [facility] with this information, suggested he ask her to conducted orthostatic VS, encouraged him to have her call me if she had questions. 4. He verbalized understanding.  Gayleen Orem, RN, BSN, [place] at [place] [phone]

## 2014-09-12 ENCOUNTER — Ambulatory Visit (HOSPITAL_BASED_OUTPATIENT_CLINIC_OR_DEPARTMENT_OTHER): Payer: Medicare Other

## 2014-09-12 ENCOUNTER — Other Ambulatory Visit: Payer: Self-pay | Admitting: Radiation Oncology

## 2014-09-12 ENCOUNTER — Telehealth: Payer: Self-pay | Admitting: *Deleted

## 2014-09-12 ENCOUNTER — Encounter: Payer: Self-pay | Admitting: *Deleted

## 2014-09-12 DIAGNOSIS — C089 Malignant neoplasm of major salivary gland, unspecified: Secondary | ICD-10-CM | POA: Diagnosis not present

## 2014-09-12 DIAGNOSIS — C4440 Unspecified malignant neoplasm of skin of scalp and neck: Secondary | ICD-10-CM

## 2014-09-12 DIAGNOSIS — Z5189 Encounter for other specified aftercare: Secondary | ICD-10-CM | POA: Diagnosis not present

## 2014-09-12 MED ORDER — SODIUM CHLORIDE 0.9 % IV SOLN
INTRAVENOUS | Status: DC
  Administered 2014-09-12: 14:00:00 via INTRAVENOUS
  Filled 2014-09-12: qty 1000

## 2014-09-12 NOTE — Progress Notes (Addendum)
Met with patient in Infusion. In response to my inquiry, he reported: 1. Over the past couple days, has been drinking 6 boxes/cans of Breeze fruit juice/Ensure and a couple glasses of water daily but eating no solid food with the exception of a bowel of Cherrios this morning. 2. Absence of throat pain, "throat feels the best it has in a long time", is able to swallow. 3. Absence of appetite. 4. He last saw his PCP Dr. Ronnald Ramp 6 months ago.  I encouraged him to arrange an appointment with PCP for a thorough health evaluation.  He verbalized understanding. 5. He did contact [facility] RN to assess him for bouts of dizziness per our recent discussion.    I encouraged him to do so.  He verbalized understanding.   Gayleen Orem, RN, BSN, [place] at [place] [phone]

## 2014-09-12 NOTE — Telephone Encounter (Signed)
Spoke with patient dtr (patient sleeping) to inform appt for IVF has been scheduled for this afternoon @ 1:30.  She verbalized understanding.  Gayleen Orem, RN, BSN, [place] at [place] [phone]

## 2014-09-12 NOTE — Progress Notes (Signed)
1720: Pt ambulatory with cane at discharge. Escorted by this RN to [place] parking where daughter was waiting to drive patient home. Pt denies any dizziness or light-headedness at time of discharge. Discharged in no apparent distress.

## 2014-09-12 NOTE — Telephone Encounter (Signed)
I called patient's dtr, Darlin Drop, to convey importance of arranging appt for patient with PCP for a thorough evaluation to identify/address health issues concurrent with RT recovery.  She later called to report that there was a message on patient's phone that he has an appt on Monday, 09/15/14,at 10:00. I encouraged her also to have [facility] RN evaluate patient for his reports of intermittent dizziness.  She verbalized understanding.  Gayleen Orem, RN, BSN, [place] at [place] [phone]

## 2014-09-12 NOTE — Patient Instructions (Signed)
Dehydration, Adult Dehydration is when you lose more fluids from the body than you take in. Vital organs like the kidneys, brain, and heart cannot function without a proper amount of fluids and salt. Any loss of fluids from the body can cause dehydration.  CAUSES   Vomiting.  Diarrhea.  Excessive sweating.  Excessive urine output.  Fever. SYMPTOMS  Mild dehydration  Thirst.  Dry lips.  Slightly dry mouth. Moderate dehydration  Very dry mouth.  Sunken eyes.  Skin does not bounce back quickly when lightly pinched and released.  Dark urine and decreased urine production.  Decreased tear production.  Headache. Severe dehydration  Very dry mouth.  Extreme thirst.  Rapid, weak pulse (more than 100 beats per minute at rest).  Cold hands and feet.  Not able to sweat in spite of heat and temperature.  Rapid breathing.  Blue lips.  Confusion and lethargy.  Difficulty being awakened.  Minimal urine production.  No tears. DIAGNOSIS  Your caregiver will diagnose dehydration based on your symptoms and your exam. Blood and urine tests will help confirm the diagnosis. The diagnostic evaluation should also identify the cause of dehydration. TREATMENT  Treatment of mild or moderate dehydration can often be done at home by increasing the amount of fluids that you drink. It is best to drink small amounts of fluid more often. Drinking too much at one time can make vomiting worse. Refer to the home care instructions below. Severe dehydration needs to be treated at the hospital where you will probably be given intravenous (IV) fluids that contain water and electrolytes. HOME CARE INSTRUCTIONS   Ask your caregiver about specific rehydration instructions.  Drink enough fluids to keep your urine clear or pale yellow.  Drink small amounts frequently if you have nausea and vomiting.  Eat as you normally do.  Avoid:  Foods or drinks high in sugar.  Carbonated  drinks.  Juice.  Extremely hot or cold fluids.  Drinks with caffeine.  Fatty, greasy foods.  Alcohol.  Tobacco.  Overeating.  Gelatin desserts.  Wash your hands well to avoid spreading bacteria and viruses.  Only take over-the-counter or prescription medicines for pain, discomfort, or fever as directed by your caregiver.  Ask your caregiver if you should continue all prescribed and over-the-counter medicines.  Keep all follow-up appointments with your caregiver. SEEK MEDICAL CARE IF:  You have abdominal pain and it increases or stays in one area (localizes).  You have a rash, stiff neck, or severe headache.  You are irritable, sleepy, or difficult to awaken.  You are weak, dizzy, or extremely thirsty. SEEK IMMEDIATE MEDICAL CARE IF:   You are unable to keep fluids down or you get worse despite treatment.  You have frequent episodes of vomiting or diarrhea.  You have blood or green matter (bile) in your vomit.  You have blood in your stool or your stool looks black and tarry.  You have not urinated in 6 to 8 hours, or you have only urinated a small amount of very dark urine.  You have a fever.  You faint. MAKE SURE YOU:   Understand these instructions.  Will watch your condition.  Will get help right away if you are not doing well or get worse. Document Released: 05/23/2005 Document Revised: 08/15/2011 Document Reviewed: 01/10/2011 ExitCare Patient Information 2015 [employer]. This information is not intended to replace advice given to you by your health care provider. Make sure you discuss any questions you have with your health care   provider.  

## 2014-09-15 ENCOUNTER — Ambulatory Visit (INDEPENDENT_AMBULATORY_CARE_PROVIDER_SITE_OTHER): Payer: Medicare Other | Admitting: Internal Medicine

## 2014-09-15 ENCOUNTER — Encounter: Payer: Self-pay | Admitting: Internal Medicine

## 2014-09-15 VITALS — BP 104/64 | HR 75 | Temp 97.8°F | Resp 16 | Ht 67.0 in | Wt 160.0 lb

## 2014-09-15 DIAGNOSIS — R630 Anorexia: Secondary | ICD-10-CM | POA: Diagnosis not present

## 2014-09-15 DIAGNOSIS — I10 Essential (primary) hypertension: Secondary | ICD-10-CM | POA: Diagnosis not present

## 2014-09-15 DIAGNOSIS — R1310 Dysphagia, unspecified: Secondary | ICD-10-CM

## 2014-09-15 DIAGNOSIS — E038 Other specified hypothyroidism: Secondary | ICD-10-CM | POA: Diagnosis not present

## 2014-09-15 MED ORDER — MIRTAZAPINE 15 MG PO TABS: 15.0000 mg | ORAL_TABLET | Freq: Every day | ORAL | Status: DC

## 2014-09-15 MED ORDER — LEVOTHYROXINE SODIUM 125 MCG PO TABS: 125.0000 ug | ORAL_TABLET | Freq: Every day | ORAL | Status: DC

## 2014-09-15 NOTE — Assessment & Plan Note (Signed)
He has been receiving IV fluids for dehydration and has had a lot of nutritional support yet he is still losing weight He was seen by IR for a feeding tube placement but they were not able to perform the procedure Will refer to GI to consider feeding tube placement

## 2014-09-15 NOTE — Assessment & Plan Note (Signed)
His recent TSh was suppressed so I have lowered his synthroid dose

## 2014-09-15 NOTE — Assessment & Plan Note (Signed)
He has not been taking the mirtazipine correctly so I wrote it out at a higher dose hoping that it would stimulate his appetite

## 2014-09-15 NOTE — Patient Instructions (Signed)
Hypothyroidism The thyroid is a large gland located in the lower front of your neck. The thyroid gland helps control metabolism. Metabolism is how your body handles food. It controls metabolism with the hormone thyroxine. When this gland is underactive (hypothyroid), it produces too little hormone.  CAUSES These include:   Absence or destruction of thyroid tissue.  Goiter due to iodine deficiency.  Goiter due to medications.  Congenital defects (since birth).  Problems with the pituitary. This causes a lack of TSH (thyroid stimulating hormone). This hormone tells the thyroid to turn out more hormone. SYMPTOMS  Lethargy (feeling as though you have no energy)  Cold intolerance  Weight gain (in spite of normal food intake)  Dry skin  Coarse hair  Menstrual irregularity (if severe, may lead to infertility)  Slowing of thought processes Cardiac problems are also caused by insufficient amounts of thyroid hormone. Hypothyroidism in the newborn is cretinism, and is an extreme form. It is important that this form be treated adequately and immediately or it will lead rapidly to retarded physical and mental development. DIAGNOSIS  To prove hypothyroidism, your caregiver may do blood tests and ultrasound tests. Sometimes the signs are hidden. It may be necessary for your caregiver to watch this illness with blood tests either before or after diagnosis and treatment. TREATMENT  Low levels of thyroid hormone are increased by using synthetic thyroid hormone. This is a safe, effective treatment. It usually takes about four weeks to gain the full effects of the medication. After you have the full effect of the medication, it will generally take another four weeks for problems to leave. Your caregiver may start you on low doses. If you have had heart problems the dose may be gradually increased. It is generally not an emergency to get rapidly to normal. HOME CARE INSTRUCTIONS   Take your  medications as your caregiver suggests. Let your caregiver know of any medications you are taking or start taking. Your caregiver will help you with dosage schedules.  As your condition improves, your dosage needs may increase. It will be necessary to have continuing blood tests as suggested by your caregiver.  Report all suspected medication side effects to your caregiver. SEEK MEDICAL CARE IF: Seek medical care if you develop:  Sweating.  Tremulousness (tremors).  Anxiety.  Rapid weight loss.  Heat intolerance.  Emotional swings.  Diarrhea.  Weakness. SEEK IMMEDIATE MEDICAL CARE IF:  You develop chest pain, an irregular heart beat (palpitations), or a rapid heart beat. MAKE SURE YOU:   Understand these instructions.  Will watch your condition.  Will get help right away if you are not doing well or get worse. Document Released: 05/23/2005 Document Revised: 08/15/2011 Document Reviewed: 01/11/2008 Exit[employer] Patient Information 2015 [employer]. This information is not intended to replace advice given to you by your health care provider. Make sure you discuss any questions you have with your health care provider.  

## 2014-09-15 NOTE — Progress Notes (Signed)
Pre visit review using our clinic review tool, if applicable. No additional management support is needed unless otherwise documented below in the visit note. 

## 2014-09-15 NOTE — Assessment & Plan Note (Signed)
His BP is well controlled 

## 2014-09-15 NOTE — Progress Notes (Signed)
Subjective:    Patient ID: Adrian West, male    DOB: 22-Jan-1928, 81 y.o.   MRN: [id number]  Thyroid Problem Presents for follow-up visit. Symptoms include depressed mood, fatigue and weight loss. Patient reports no anxiety, cold intolerance, constipation, diaphoresis, diarrhea, dry skin, hair loss, heat intolerance, hoarse voice, leg swelling, nail problem, palpitations, tremors, visual change or weight gain. Past treatments include levothyroxine.      Review of Systems  Constitutional: Positive for weight loss, fatigue and unexpected weight change. Negative for fever, chills, weight gain, diaphoresis, activity change and appetite change.  HENT: Positive for trouble swallowing. Negative for hoarse voice, sore throat and voice change.   Eyes: Negative.   Respiratory: Negative.  Negative for cough, choking, chest tightness, shortness of breath and stridor.   Cardiovascular: Negative.  Negative for chest pain, palpitations and leg swelling.  Gastrointestinal: Negative.  Negative for nausea, vomiting, abdominal pain, diarrhea and constipation.  Endocrine: Negative.  Negative for cold intolerance and heat intolerance.  Genitourinary: Negative.  Negative for difficulty urinating.  Musculoskeletal: Negative.   Skin: Negative.  Negative for rash.  Allergic/Immunologic: Negative.   Neurological: Positive for facial asymmetry. Negative for dizziness, tremors and light-headedness.  Hematological: Negative.  Negative for adenopathy. Does not bruise/bleed easily.  Psychiatric/Behavioral: Negative.  The patient is not nervous/anxious.        Objective:   Physical Exam  Constitutional: He is oriented to person, place, and time.  Non-toxic appearance. He has a sickly appearance. He appears ill. He appears distressed.  HENT:  Mouth/Throat: Oropharynx is clear and moist. No oropharyngeal exudate.  Eyes: Conjunctivae are normal. Right eye exhibits no discharge. Left eye exhibits no discharge. No  scleral icterus.  Neck: Normal range of motion. Neck supple. No JVD present. No tracheal deviation present. No thyromegaly present.  Cardiovascular: Normal rate, regular rhythm, normal heart sounds and intact distal pulses.  Exam reveals no gallop and no friction rub.   No murmur heard. Pulmonary/Chest: Effort normal and breath sounds normal. No stridor. No respiratory distress. He has no wheezes. He has no rales. He exhibits no tenderness.  Abdominal: Soft. Bowel sounds are normal. He exhibits no distension and no mass. There is no tenderness. There is no rebound and no guarding.  Musculoskeletal: Normal range of motion. He exhibits no edema or tenderness.  Lymphadenopathy:    He has no cervical adenopathy.  Neurological: He is alert and oriented to person, place, and time. He has normal reflexes. He displays normal reflexes. A cranial nerve deficit is present. He exhibits normal muscle tone. Coordination normal.  Skin: Skin is warm and dry. No rash noted. He is not diaphoretic. No erythema. No pallor.  Psychiatric: He has a normal mood and affect. His behavior is normal. Judgment and thought content normal.  Vitals reviewed.    Lab Results  Component Value Date   WBC 4.1 09/10/2014   HGB 12.6* 09/10/2014   HCT 38.7 09/10/2014   PLT 221 09/10/2014   GLUCOSE 95 09/10/2014   CHOL 166 10/22/2013   TRIG 152.0* 10/22/2013   HDL 37.80* 10/22/2013   LDLDIRECT 145.2 09/20/2010   LDLCALC 98 10/22/2013   ALT 23 10/22/2013   AST 21 10/22/2013   NA 140 09/10/2014   K 3.9 09/10/2014   CL 104 07/25/2014   CREATININE 0.7 09/10/2014   BUN 17.6 09/10/2014   CO2 23 09/10/2014   TSH 0.182* 08/29/2014   PSA 0.69 02/14/2008   INR 1.09 07/25/2014   HGBA1C 5.5  03/06/2013       Assessment & Plan:

## 2014-09-16 ENCOUNTER — Telehealth: Payer: Self-pay | Admitting: Internal Medicine

## 2014-09-16 NOTE — Telephone Encounter (Signed)
The patient has been difficult to sedate with standard endoscopy means previously as well as has a history of paralyzed diaphragm. I think, in this case, he would be best served with surgically placed gastrostomy tube under the controlled setting of the operating theater.

## 2014-09-16 NOTE — Telephone Encounter (Signed)
Referral in epic for feeding tube placement. IR tried to place tube 07/25/14 and were not able to get the tube placed. Per note it states pt needs to see surgery for tube placement. Dr. Henrene Pastor do you want to see this pt or should they be referred to surgery? Please advise.

## 2014-09-16 NOTE — Telephone Encounter (Signed)
Dustin please see Dr. Blanch Media comments. PCP needs to refer pt to a surgeon.

## 2014-09-17 ENCOUNTER — Telehealth: Payer: Self-pay | Admitting: Internal Medicine

## 2014-09-17 ENCOUNTER — Other Ambulatory Visit: Payer: Self-pay | Admitting: Internal Medicine

## 2014-09-17 DIAGNOSIS — Z4659 Encounter for fitting and adjustment of other gastrointestinal appliance and device: Secondary | ICD-10-CM

## 2014-09-17 NOTE — Telephone Encounter (Signed)
Left message for pt to call back  °

## 2014-09-17 NOTE — Telephone Encounter (Signed)
Pt informed.  Transferred pt to PCP office.

## 2014-09-18 DIAGNOSIS — L570 Actinic keratosis: Secondary | ICD-10-CM | POA: Diagnosis not present

## 2014-09-18 DIAGNOSIS — D0439 Carcinoma in situ of skin of other parts of face: Secondary | ICD-10-CM | POA: Diagnosis not present

## 2014-09-18 NOTE — Telephone Encounter (Signed)
Spoke with pt and discussed with him Dr. Blanch Media recommendation regarding g tube placement by surgery. Pt instructed to contact his PCP for referral to surgery. Pt aware.

## 2014-09-22 ENCOUNTER — Ambulatory Visit (HOSPITAL_BASED_OUTPATIENT_CLINIC_OR_DEPARTMENT_OTHER)
Admission: RE | Admit: 2014-09-22 | Discharge: 2014-09-22 | Disposition: A | Discharge status: Home / Self Care | Payer: Medicare Other | Source: Ambulatory Visit | Attending: Radiation Oncology | Admitting: Radiation Oncology

## 2014-09-22 ENCOUNTER — Ambulatory Visit (HOSPITAL_BASED_OUTPATIENT_CLINIC_OR_DEPARTMENT_OTHER): Payer: Medicare Other

## 2014-09-22 ENCOUNTER — Other Ambulatory Visit: Payer: Self-pay | Admitting: Pharmacist

## 2014-09-22 ENCOUNTER — Encounter: Payer: Self-pay | Admitting: Radiation Oncology

## 2014-09-22 ENCOUNTER — Telehealth: Payer: Self-pay | Admitting: Oncology

## 2014-09-22 ENCOUNTER — Ambulatory Visit
Admission: RE | Admit: 2014-09-22 | Discharge: 2014-09-22 | Disposition: A | Discharge status: Home / Self Care | Payer: Medicare Other | Source: Ambulatory Visit | Attending: Radiation Oncology | Admitting: Radiation Oncology

## 2014-09-22 VITALS — BP 98/59 | HR 69 | Temp 97.5°F | Resp 18 | Wt 158.2 lb

## 2014-09-22 VITALS — BP 103/53 | HR 68 | Temp 97.4°F

## 2014-09-22 DIAGNOSIS — C770 Secondary and unspecified malignant neoplasm of lymph nodes of head, face and neck: Secondary | ICD-10-CM

## 2014-09-22 DIAGNOSIS — C089 Malignant neoplasm of major salivary gland, unspecified: Secondary | ICD-10-CM

## 2014-09-22 DIAGNOSIS — C4441 Basal cell carcinoma of skin of scalp and neck: Secondary | ICD-10-CM

## 2014-09-22 DIAGNOSIS — E876 Hypokalemia: Secondary | ICD-10-CM | POA: Diagnosis not present

## 2014-09-22 DIAGNOSIS — C4440 Unspecified malignant neoplasm of skin of scalp and neck: Secondary | ICD-10-CM

## 2014-09-22 LAB — BASIC METABOLIC PANEL (CC13)
Anion Gap: 12 mEq/L — ABNORMAL HIGH (ref 3–11)
BUN: 17.4 mg/dL (ref 7.0–26.0)
CALCIUM: 8.9 mg/dL (ref 8.4–10.4)
CHLORIDE: 106 meq/L (ref 98–109)
CO2: 23 meq/L (ref 22–29)
CREATININE: 0.8 mg/dL (ref 0.7–1.3)
EGFR: 81 mL/min/{1.73_m2} — ABNORMAL LOW (ref >90)
Glucose: 66 mg/dl — ABNORMAL LOW (ref 70–140)
Potassium: 3.2 mEq/L — ABNORMAL LOW (ref 3.5–5.1)
Sodium: 141 mEq/L (ref 136–145)

## 2014-09-22 MED ORDER — SODIUM CHLORIDE 0.9 % IV SOLN: INTRAVENOUS | Status: DC

## 2014-09-22 MED ORDER — SODIUM CHLORIDE 0.9 % IV SOLN
Freq: Once | INTRAVENOUS | Status: DC
  Filled 2014-09-22: qty 1000

## 2014-09-22 MED ORDER — SODIUM CHLORIDE 0.9 % IV SOLN
Freq: Once | INTRAVENOUS | Status: AC
  Administered 2014-09-22: 15:00:00 via INTRAVENOUS
  Filled 2014-09-22: qty 1000

## 2014-09-22 NOTE — Telephone Encounter (Signed)
Called infusion and was able to get an appointment for Layson's fluids today at 2:15 per Burman Nieves, RN.  Called patient and he is agreeable with this time.  Called Michele back in infusion to make the appointment.  She will work on scheduling his infusion appointments, twice a week, for the next 3 weeks.

## 2014-09-22 NOTE — Patient Instructions (Signed)
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
## 2014-09-22 NOTE — Progress Notes (Signed)
Per Dr. Pearlie Oyster order assessed vitals standing. Patient reports feeling dizzy upon standing. Standing heart rate 69 and blood pressure 98/59. Findings reported to Dr. Isidore Moos.

## 2014-09-23 ENCOUNTER — Encounter: Payer: Self-pay | Admitting: *Deleted

## 2014-09-23 ENCOUNTER — Telehealth: Payer: Self-pay | Admitting: *Deleted
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
## 2014-09-23 NOTE — Telephone Encounter (Signed)
CALLED PATIENT TO INFORM OF APPTS., LVM FOR A RETURN CALL 

## 2014-09-24 ENCOUNTER — Ambulatory Visit (HOSPITAL_BASED_OUTPATIENT_CLINIC_OR_DEPARTMENT_OTHER): Payer: Medicare Other

## 2014-09-24 VITALS — BP 117/58 | HR 53 | Temp 97.0°F

## 2014-09-24 DIAGNOSIS — C770 Secondary and unspecified malignant neoplasm of lymph nodes of head, face and neck: Secondary | ICD-10-CM

## 2014-09-24 MED ORDER — SODIUM CHLORIDE 0.9 % IV SOLN
Freq: Once | INTRAVENOUS | Status: DC
  Administered 2014-09-24: 15:00:00 via INTRAVENOUS
  Filled 2014-09-24: qty 1000

## 2014-09-24 MED ORDER — SODIUM CHLORIDE 0.9 % IV SOLN
Freq: Once | INTRAVENOUS | Status: AC
  Administered 2014-09-24: 16:00:00 via INTRAVENOUS
  Filled 2014-09-24: qty 1000

## 2014-09-24 NOTE — Patient Instructions (Signed)

## 2014-09-25 ENCOUNTER — Telehealth: Payer: Self-pay | Admitting: *Deleted

## 2014-09-25 ENCOUNTER — Telehealth: Payer: Self-pay | Admitting: Nutrition

## 2014-09-25 NOTE — Telephone Encounter (Signed)
Returned patient's VM in which he questioned directions for taking potassium liquid.  I LVM on home phone and cell with directions for administration.  I noted I would follow-up tomorrow morning to insure he understands.  Gayleen Orem, RN, BSN, [place] at [place] [phone]

## 2014-09-25 NOTE — Telephone Encounter (Signed)
Patient left message that he cannot attend nutrition consult on Friday, April 22 at 10:30. Patient requesting change in nutrition consult to Monday, April 20 first thing in the morning. I do have an opening at 9:00 on Monday, April 20, for which I scheduled patient. Contacted patient and left message on home phone.  **Disclaimer: This note was dictated with voice recognition software. Similar sounding words can inadvertently be transcribed and this note may contain transcription errors which may not have been corrected upon publication of note.**

## 2014-09-25 NOTE — Telephone Encounter (Signed)
Entry error

## 2014-09-26 ENCOUNTER — Encounter: Payer: Self-pay | Admitting: Nutrition

## 2014-09-26 ENCOUNTER — Telehealth: Payer: Self-pay | Admitting: *Deleted

## 2014-09-26 NOTE — Telephone Encounter (Signed)
Returned patient's VM re: taking K tablets instead of liquid form as currently prescribed and Rx for Megace.  LVM: 1. Encouraged him to continue taking liquid K as prescribed, to bring K tablets with him next week to determine if viable alternative.  He did not indicate reason for wanting to make change. 2. Indicated he can have further discussion re: Megace when he comes in next week.  Gayleen Orem, RN, BSN, [place] at [place] [phone]

## 2014-09-29 ENCOUNTER — Telehealth: Payer: Self-pay | Admitting: *Deleted

## 2014-09-29 ENCOUNTER — Encounter: Payer: Self-pay | Admitting: Nutrition

## 2014-09-29 NOTE — Telephone Encounter (Signed)
Entry error

## 2014-09-29 NOTE — Telephone Encounter (Addendum)
Pt called at 12:37.   1. He reported he is sleeping most of time. 2. Expressed urgency to have PEG place. 3. I confirmed with Dr. Isidore Moos that referral had been made to [facility]. 4. I confirmed with [facility] that patient has 11:00 appt this Friday 10/03/14 with Dr. Marlou Starks.   5. I notified patient.  Gayleen Orem, RN, BSN, [place] at [place] [phone]

## 2014-09-29 NOTE — Telephone Encounter (Signed)
Called patient's home, spoke with his dtr. Libby. 1. I informed her of patient's 11:00 appt this Friday with Dr. Marlou Starks, [facility], directed to arrive at 10:30.  I indicated Dr. Marlou Starks will discuss/schedule PEG placement during this appt. 2. I reviewed patient's [facility] appts for this week.   3. Dtr verbalized understanding of information provided.  Gayleen Orem, RN, BSN, [place] at [place] [phone]

## 2014-09-29 NOTE — Progress Notes (Signed)
Patient did not show up for scheduled nutrition appointment. 

## 2014-09-30 ENCOUNTER — Ambulatory Visit: Payer: Medicare Other | Admitting: Nutrition

## 2014-09-30 ENCOUNTER — Other Ambulatory Visit: Payer: Self-pay | Admitting: Medical Oncology

## 2014-09-30 ENCOUNTER — Telehealth: Payer: Self-pay | Admitting: Medical Oncology

## 2014-09-30 ENCOUNTER — Ambulatory Visit (HOSPITAL_BASED_OUTPATIENT_CLINIC_OR_DEPARTMENT_OTHER): Payer: Medicare Other

## 2014-09-30 DIAGNOSIS — C089 Malignant neoplasm of major salivary gland, unspecified: Secondary | ICD-10-CM | POA: Diagnosis not present

## 2014-09-30 MED ORDER — SODIUM CHLORIDE 0.9 % IV SOLN
Freq: Once | INTRAVENOUS | Status: DC
  Administered 2014-09-30: 15:00:00 via INTRAVENOUS
  Filled 2014-09-30: qty 1000

## 2014-09-30 NOTE — Telephone Encounter (Signed)
Per dr Isidore Moos she wants pt to receive IVF with potassium twice a week. Last IVF was 4/21. Orders released for today. She does not want any labs this week. I called pt left message to see if he can come in earlier today.

## 2014-09-30 NOTE — Patient Instructions (Signed)
Dehydration, Adult Dehydration is when you lose more fluids from the body than you take in. Vital organs like the kidneys, brain, and heart cannot function without a proper amount of fluids and salt. Any loss of fluids from the body can cause dehydration.  CAUSES   Vomiting.  Diarrhea.  Excessive sweating.  Excessive urine output.  Fever. SYMPTOMS  Mild dehydration  Thirst.  Dry lips.  Slightly dry mouth. Moderate dehydration  Very dry mouth.  Sunken eyes.  Skin does not bounce back quickly when lightly pinched and released.  Dark urine and decreased urine production.  Decreased tear production.  Headache. Severe dehydration  Very dry mouth.  Extreme thirst.  Rapid, weak pulse (more than 100 beats per minute at rest).  Cold hands and feet.  Not able to sweat in spite of heat and temperature.  Rapid breathing.  Blue lips.  Confusion and lethargy.  Difficulty being awakened.  Minimal urine production.  No tears. DIAGNOSIS  Your caregiver will diagnose dehydration based on your symptoms and your exam. Blood and urine tests will help confirm the diagnosis. The diagnostic evaluation should also identify the cause of dehydration. TREATMENT  Treatment of mild or moderate dehydration can often be done at home by increasing the amount of fluids that you drink. It is best to drink small amounts of fluid more often. Drinking too much at one time can make vomiting worse. Refer to the home care instructions below. Severe dehydration needs to be treated at the hospital where you will probably be given intravenous (IV) fluids that contain water and electrolytes. HOME CARE INSTRUCTIONS   Ask your caregiver about specific rehydration instructions.  Drink enough fluids to keep your urine clear or pale yellow.  Drink small amounts frequently if you have nausea and vomiting.  Eat as you normally do.  Avoid:  Foods or drinks high in sugar.  Carbonated  drinks.  Juice.  Extremely hot or cold fluids.  Drinks with caffeine.  Fatty, greasy foods.  Alcohol.  Tobacco.  Overeating.  Gelatin desserts.  Wash your hands well to avoid spreading bacteria and viruses.  Only take over-the-counter or prescription medicines for pain, discomfort, or fever as directed by your caregiver.  Ask your caregiver if you should continue all prescribed and over-the-counter medicines.  Keep all follow-up appointments with your caregiver. SEEK MEDICAL CARE IF:  You have abdominal pain and it increases or stays in one area (localizes).  You have a rash, stiff neck, or severe headache.  You are irritable, sleepy, or difficult to awaken.  You are weak, dizzy, or extremely thirsty. SEEK IMMEDIATE MEDICAL CARE IF:   You are unable to keep fluids down or you get worse despite treatment.  You have frequent episodes of vomiting or diarrhea.  You have blood or green matter (bile) in your vomit.  You have blood in your stool or your stool looks black and tarry.  You have not urinated in 6 to 8 hours, or you have only urinated a small amount of very dark urine.  You have a fever.  You faint. MAKE SURE YOU:   Understand these instructions.  Will watch your condition.  Will get help right away if you are not doing well or get worse. Document Released: 05/23/2005 Document Revised: 08/15/2011 Document Reviewed: 01/10/2011 ExitCare Patient Information 2015 [employer]. This information is not intended to replace advice given to you by your health care provider. Make sure you discuss any questions you have with your health care   provider.  

## 2014-09-30 NOTE — Progress Notes (Signed)
Nutrition follow-up completed with patient during IV fluids. Weight documented as 158.2 pounds April 18.  BMI within normal range at 24.77. Weight down approximately 11 pounds over 2 months. Patient states he ate half of a steak sandwich yesterday.   He enjoys drinking boost breeze and tolerates this consistency well.  Prefers regular ensure instead of Ensure Plus. Patient agrees to drink 5 resource breeze and 3 regular ensure every day to provide approximately 2000 cal.  Estimated nutrition needs: 2150-2350 calories, 96-110 grams protein, 2.2 L fluid.  Nutrition diagnosis: Food and nutrition related knowledge deficit continues.  Intervention:  Patient agreed to drink 8 bottles of nutrition supplements (250 calories each) daily by mouth.   Patient will continue to try to increase soft solid foods. Patient will meet with surgeon on Friday discuss feeding tube placement. Teach back method used.  Monitoring, evaluation, goals:  Patient will tolerate increased intake of oral nutrition supplements to minimize further weight loss.  Next visit: I will schedule a follow-up with patient after he meets with the surgeon to discuss feeding tube.  **Disclaimer: This note was dictated with voice recognition software. Similar sounding words can inadvertently be transcribed and this note may contain transcription errors which may not have been corrected upon publication of note.**

## 2014-09-30 NOTE — Telephone Encounter (Signed)
I left message on pt cell and home phone to come in now for fluids if he can .

## 2014-10-02 ENCOUNTER — Ambulatory Visit: Payer: Medicare Other

## 2014-10-02 ENCOUNTER — Other Ambulatory Visit (HOSPITAL_BASED_OUTPATIENT_CLINIC_OR_DEPARTMENT_OTHER): Payer: Medicare Other | Admitting: *Deleted

## 2014-10-02 DIAGNOSIS — R1310 Dysphagia, unspecified: Secondary | ICD-10-CM

## 2014-10-02 DIAGNOSIS — E876 Hypokalemia: Secondary | ICD-10-CM | POA: Diagnosis not present

## 2014-10-02 DIAGNOSIS — C089 Malignant neoplasm of major salivary gland, unspecified: Secondary | ICD-10-CM

## 2014-10-02 MED ORDER — SODIUM CHLORIDE 0.9 % IV SOLN
Freq: Once | INTRAVENOUS | Status: AC
  Administered 2014-10-02: 18:00:00 via INTRAVENOUS

## 2014-10-02 NOTE — Progress Notes (Unsigned)
Pt received 1 L NS without K+ & instructed to take oral K+ at home.  Pt reports that he has not been taking liq K+ at home due to taste but has oral pills at home &was instructed to take the oral equivalent of 20 meq K+ & to cont taking as ordered per Selena Lesser NP.

## 2014-10-03 ENCOUNTER — Encounter: Payer: Self-pay | Admitting: *Deleted

## 2014-10-03 ENCOUNTER — Ambulatory Visit: Payer: Self-pay

## 2014-10-03 ENCOUNTER — Telehealth: Payer: Self-pay | Admitting: *Deleted

## 2014-10-03 DIAGNOSIS — E46 Unspecified protein-calorie malnutrition: Secondary | ICD-10-CM | POA: Diagnosis not present

## 2014-10-03 NOTE — Telephone Encounter (Signed)
error 
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
## 2014-10-07 ENCOUNTER — Telehealth: Payer: Self-pay | Admitting: Internal Medicine

## 2014-10-07 ENCOUNTER — Other Ambulatory Visit: Payer: Self-pay | Admitting: Internal Medicine

## 2014-10-07 NOTE — Telephone Encounter (Signed)
New message      Request for surgical clearance:  1. What type of surgery is being performed?  Stomach tube placed  When is this surgery scheduled? Pending clearance---need to schedule as soon as possible 2. Are there any medications that need to be held prior to surgery and how long? Pt stopped plavix on his own 3-4 days ago---want Dr Caryl Comes to know this.  Is this ok?  Name of physician performing surgery? Dr Marlou [facility]arks at [facility] 3. What is your office phone and fax number? Fax [phone]

## 2014-10-08 ENCOUNTER — Telehealth: Payer: Self-pay | Admitting: *Deleted

## 2014-10-08 ENCOUNTER — Other Ambulatory Visit: Payer: Self-pay | Admitting: Radiation Oncology

## 2014-10-08 DIAGNOSIS — C770 Secondary and unspecified malignant neoplasm of lymph nodes of head, face and neck: Secondary | ICD-10-CM

## 2014-10-08 NOTE — Telephone Encounter (Signed)
Returned patient's VM in which he stated he feels he needs IVF. 1. He reported:  Oral intake includes 3-4 boxes of Breeze and 2-3 cans Ensure daily.  He had a bowl of Cheerios for breakfast and a cup of onion soup for lunch.  He feels week, is experiencing episodes of dizziness.  I encouraged him to be evaluated by [facility] nursing. 2. He indicated that PEG placement is presently scheduled for 10/24/14. 3. He stated based on past experience, he would benefit from IVF. 4. I notified Dr. Isidore Moos.  Gayleen Orem, RN, BSN, [facility] at [place] [phone]

## 2014-10-09 ENCOUNTER — Other Ambulatory Visit: Payer: Self-pay | Admitting: Oncology

## 2014-10-09 ENCOUNTER — Telehealth: Payer: Self-pay | Admitting: Oncology

## 2014-10-09 ENCOUNTER — Other Ambulatory Visit: Payer: Self-pay

## 2014-10-09 ENCOUNTER — Ambulatory Visit
Admission: RE | Admit: 2014-10-09 | Discharge: 2014-10-09 | Disposition: A | Discharge status: Home / Self Care | Payer: Medicare Other | Source: Ambulatory Visit | Attending: Radiation Oncology | Admitting: Radiation Oncology

## 2014-10-09 ENCOUNTER — Encounter: Payer: Self-pay | Admitting: Oncology

## 2014-10-09 ENCOUNTER — Ambulatory Visit (HOSPITAL_COMMUNITY)
Admission: RE | Admit: 2014-10-09 | Discharge: 2014-10-09 | Disposition: A | Discharge status: Home / Self Care | Payer: Medicare Other | Source: Ambulatory Visit | Attending: Radiation Oncology | Admitting: Radiation Oncology

## 2014-10-09 ENCOUNTER — Encounter: Payer: Self-pay | Admitting: *Deleted

## 2014-10-09 DIAGNOSIS — C4441 Basal cell carcinoma of skin of scalp and neck: Secondary | ICD-10-CM | POA: Diagnosis not present

## 2014-10-09 DIAGNOSIS — C770 Secondary and unspecified malignant neoplasm of lymph nodes of head, face and neck: Secondary | ICD-10-CM | POA: Diagnosis not present

## 2014-10-09 DIAGNOSIS — C089 Malignant neoplasm of major salivary gland, unspecified: Secondary | ICD-10-CM

## 2014-10-09 LAB — BASIC METABOLIC PANEL (CC13)
ANION GAP: 11 meq/L (ref 3–11)
BUN: 11.7 mg/dL (ref 7.0–26.0)
CALCIUM: 9 mg/dL (ref 8.4–10.4)
CHLORIDE: 105 meq/L (ref 98–109)
CO2: 26 meq/L (ref 22–29)
CREATININE: 0.8 mg/dL (ref 0.7–1.3)
EGFR: 82 mL/min/{1.73_m2} — ABNORMAL LOW (ref >90)
Glucose: 102 mg/dl (ref 70–140)
Potassium: 3.5 mEq/L (ref 3.5–5.1)
SODIUM: 142 meq/L (ref 136–145)

## 2014-10-09 MED ORDER — SODIUM CHLORIDE 0.9 % IV SOLN
Freq: Once | INTRAVENOUS | Status: DC
  Filled 2014-10-09: qty 1000

## 2014-10-09 MED ORDER — SODIUM CHLORIDE 0.9 % IV SOLN
Freq: Once | INTRAVENOUS | Status: AC
  Administered 2014-10-09: 11:00:00 via INTRAVENOUS
  Filled 2014-10-09: qty 1000

## 2014-10-09 NOTE — Telephone Encounter (Signed)
Called and spoke to Mr. Lorge daughter, [name].  Advised her that Mr. Kyler has a lab appointment for 9:00 today and then [facility] at [facility] at 10 am.  [name] verbalized agreement and understanding.

## 2014-10-09 NOTE — Progress Notes (Unsigned)
BMP Lab results reviewed with Dr. Valere Dross.  Per Dr. Valere Dross, patient is OK to get normal saline with 30 mEq potassium today.  Gayleen Orem, Head and Neck Navigator, to escort Mr. Vanover to [facility].

## 2014-10-09 NOTE — Progress Notes (Signed)
To provide support and encouragement, care continuity and to assess for needs, met with patient in [facility] lobby where he was waiting for lab draw.  He was accompanied by his dtr. Libby. 1. They verbalized understanding of 0800 IVF scheduled for tomorrow in [facility]. 2. We discussed pros/cons of feeding tube placement scheduled for next Friday.  He recognized that his oral intake is not sufficient to maintain his hydration/nutritional needs, that PEG would be helpful to address/meet his needs. 3. I took him by wheelchair to [facility] for IVF s/p review of lab results by Dr. Valere Dross.    Gayleen Orem, RN, BSN, [place] at [place] [phone]

## 2014-10-09 NOTE — Telephone Encounter (Signed)
Informed Dr. Marlou Starks office that patient has not been seen by Dr. Caryl Comes since 2014. Patient most recent card visit was with Dr. [name] in September and was to follow up in 2 month, but has not. Will forward to Dr. [name] and his nurse Webb Silversmith to address.  Informed [facility]'s office patient will most likely need office visit prior to clearance.

## 2014-10-09 NOTE — Telephone Encounter (Signed)
If he needs appt soon, I can see Mr Mormile but will need to be at CHF clinic on a day that I am there. Alternatively, can see a PA in [facility] office soon.

## 2014-10-09 NOTE — Procedures (Signed)
Diagnosis: Malignant neoplasm of salivary gland MD: Isidore Moos Procedure note: IV Started, 1L NS with potassium infused, IV removed per protocol. Condition during procedure: Tolerated well. Condition after procedure: Alert, oriented and ambulatory. Accompanied by daughter.

## 2014-10-10 ENCOUNTER — Ambulatory Visit
Admission: RE | Admit: 2014-10-10 | Discharge: 2014-10-10 | Disposition: A | Discharge status: Home / Self Care | Payer: Medicare Other | Source: Ambulatory Visit | Attending: Radiation Oncology | Admitting: Radiation Oncology

## 2014-10-10 ENCOUNTER — Encounter: Payer: Self-pay | Admitting: *Deleted

## 2014-10-10 ENCOUNTER — Ambulatory Visit (HOSPITAL_BASED_OUTPATIENT_CLINIC_OR_DEPARTMENT_OTHER): Payer: Medicare Other

## 2014-10-10 VITALS — BP 119/61 | HR 58 | Temp 98.2°F | Resp 18

## 2014-10-10 VITALS — BP 124/69 | HR 64 | Wt 160.3 lb

## 2014-10-10 DIAGNOSIS — C089 Malignant neoplasm of major salivary gland, unspecified: Secondary | ICD-10-CM | POA: Diagnosis not present

## 2014-10-10 DIAGNOSIS — C770 Secondary and unspecified malignant neoplasm of lymph nodes of head, face and neck: Secondary | ICD-10-CM

## 2014-10-10 MED ORDER — SODIUM CHLORIDE 0.9 % IV SOLN
Freq: Once | INTRAVENOUS | Status: AC
  Administered 2014-10-10: 09:00:00 via INTRAVENOUS
  Filled 2014-10-10: qty 1000

## 2014-10-10 NOTE — Progress Notes (Signed)
Radiation Oncology         ([phone] ________________________________  Name: Adrian West MRN: [id number]  Date: 10/10/2014  DOB: [birth year]  Follow-Up Visit Note  CC: [name], MD  [name], MD    ICD-9-CM ICD-10-CM   1. 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 The pt is feeling well, but is not looking forward to a feeding tube being put in by Dr. Marlou Starks. Pt states he is sleeping well. Pt states he is trying his best to eat. He has gained 2 lb.   Pain Status:None  Weight changes, if any: gain 2 lb in 2-3 weeks Nutritional Status  a) intake:3 cans of ensure and 5 boxes of Breeze each day, drinks milk and soups and cereal/milk. b) using a feeding tube?:Tentatively scheduled for Friday 10/17/14.  c) weight changes, if any:  last weight 158.3 on 09/22/14 and 160.3 today.  Swallowing Status:Eats some soft foods and drinks fluids.   ALLERGIES:  has No Known Allergies.  Meds: Current Outpatient Prescriptions  Medication Sig Dispense Refill  . atorvastatin (LIPITOR) 10 MG tablet TAKE ONE TABLET EACH DAY 30 tablet 1  . erythromycin ophthalmic ointment 1 application 2 (two) times daily. Place a thin strip into the right eye twice daily    . Hypromellose (GENTEAL MILD OP) Apply 1 application to eye as needed (both eyes).    Marland Kitchen levothyroxine (SYNTHROID, LEVOTHROID) 125  MCG tablet Take 1 tablet (125 mcg total) by mouth daily. 90 tablet 1  . metoprolol succinate (TOPROL-XL) 25 MG 24 hr tablet Take 25 mg by mouth daily.    . mirtazapine (REMERON) 15 MG tablet Take 1 tablet (15 mg total) by mouth at bedtime. 90 tablet 3  . potassium chloride 20 MEQ/15ML (10%) SOLN Take 15 mLs (20 mEq total) by mouth 4 (four) times daily. May mix into water before swallowing. 473 mL 2  . senna (SENOKOT) 8.6 MG TABS tablet Take 2 tablets (17.2 mg total) by mouth 2 (two) times daily as needed for mild constipation. To prevent constipation. 30 each 2  . sodium fluoride (FLUORISHIELD) 1.1 % GEL dental gel Instill one drop of gel per tooth space of fluoride tray. Place over teeth for 5 minutes. Remove. Spit out excess. Repeat nightly. 120 mL prn  . tamsulosin (FLOMAX) 0.4 MG CAPS capsule TAKE 1 CAPSULE BY MOUTH 30 capsule 11  . aspirin EC 81 MG tablet Take 81 mg by mouth daily.    . clopidogrel (PLAVIX) 75 MG tablet TAKE ONE TABLET BY MOUTH ONCE DAILY (Patient not taking: Reported on 10/10/2014) 30 tablet 1  . lidocaine (XYLOCAINE) 2 % solution MIX 1 PART LIDOCAINE & 1 PART WATER AND SWISH AND/OR SWALLOW 59ml OF THIS MIXTURE 30 MINUTES BEFORE MEALS AND AT BEDTIME UPTO 4 TIMES DAILY (Patient not taking: Reported on 10/10/2014) 100 mL 5  . omeprazole (PRILOSEC)  40 MG capsule Take 40 mg by mouth daily.     No current facility-administered medications for this encounter.    Physical Findings:  The patient is in no acute distress. Patient is alert and oriented. walks with cane    Wt Readings from Last 3 Encounters:  10/10/14 160 lb 4.8 oz (72.712 kg)  09/15/14 160 lb (72.576 kg)  08/29/14 164 lb (74.39 kg)    weight is 160 lb 4.8 oz (72.712 kg). His blood pressure is 124/69 and his pulse is 64. His oxygen saturation is 100%. .    Lab Findings: Lab Results  Component Value Date   WBC 4.1 09/10/2014   HGB 12.6* 09/10/2014   HCT 38.7 09/10/2014   MCV 85.1 09/10/2014   PLT 221 09/10/2014    CMP Latest Ref Rng 10/09/2014 09/22/2014 09/10/2014  Glucose 70 - 140 mg/dl 102 66(L) 95  BUN 7.0 - 26.0 mg/dL 11.7 17.4 17.6  Creatinine 0.7 - 1.3 mg/dL 0.8 0.8 0.7  Sodium 136 - 145 mEq/L 142 141 140  Potassium 3.5 - 5.1 mEq/L 3.5 3.2(L) 3.9  Chloride 96 - 112 mmol/L - - -  CO2 22 - 29 mEq/L 26 23 23   Calcium 8.4 - 10.4 mg/dL 9.0 8.9 9.1  Total Protein 6.0 - 8.3 g/dL - - -  Total Bilirubin 0.2 - 1.2 mg/dL - - -  Alkaline Phos 39 - 117 U/L - - -  AST 0 - 37 U/L - - -  ALT 0 - 53 U/L - - -    Lab Results  Component Value Date   TSH 0.182* 08/29/2014    Radiographic Findings: No results found.  Impression/Plan:    1) Head and Neck Cancer Status:  Healing from RT  2) Nutritional Status: The pt states he is drinking 3 cans of ensure and 5 boxes of Breeze each day, milk, soup and tries to get soft foods down. Improving.  We discussed feeding tube again.  With improvement in weight and intake, I feel that it is best to delay this and see if he continues to improve/stabilize, given risks of anesthesia and surgery. He will discuss w/ his family  3) Risk Factors: patient knows to use good "sun hygiene"   4) Swallowing: has been seen by SLP and advised to continue with current diet as tolerated  5) Dental: Encouraged to continue regular followup with dentistry, and dental hygiene per Dr [name]  6) Thyroid function: preexisting condition of hypothyroidism   - on medication. TSH has fluctuated which could be due to multiple issues.  Adjustment in meds by Dr Ronnald Ramp. PCP.   Lab Results  Component Value Date   TSH 0.182* 08/29/2014    7) Social: Wife is in assisted living.  8) Other: PT via Home Health PT   Dysguesia: Continue zinc capsules  Continue Remeron which may help sleep  Pain: denies - stop duragesic 29mcg  Hypokalemia: The pt has not switched over from the Potassium syrup to the pills, since he does not like the taste of the syrup. Advised the pt to take 80 mEq of  Potassium daily into 2-4 doses in pill form.  9) Continue IVF once/weekly (Fridays) for 3 weeks as these may him feel better.  Labs look good (BMP). I gave a card to the pt to follow-up in 3 weeks.  10) Defer to Dr [name] on f/u imaging, if any.  May consider delaying this until patient's performance status and recovery are improved.  This document serves as a record of services personally performed by [name], MD. It was created on her behalf by Darcus Austin, a trained medical scribe. The creation of this record is based on the scribe's personal observations and the provider's statements to them. This document has been checked and approved by the attending provider.      _____________________________________   [name], MD

## 2014-10-10 NOTE — Patient Instructions (Signed)
Dehydration, Adult Dehydration is when you lose more fluids from the body than you take in. Vital organs like the kidneys, brain, and heart cannot function without a proper amount of fluids and salt. Any loss of fluids from the body can cause dehydration.  CAUSES   Vomiting.  Diarrhea.  Excessive sweating.  Excessive urine output.  Fever. SYMPTOMS  Mild dehydration  Thirst.  Dry lips.  Slightly dry mouth. Moderate dehydration  Very dry mouth.  Sunken eyes.  Skin does not bounce back quickly when lightly pinched and released.  Dark urine and decreased urine production.  Decreased tear production.  Headache. Severe dehydration  Very dry mouth.  Extreme thirst.  Rapid, weak pulse (more than 100 beats per minute at rest).  Cold hands and feet.  Not able to sweat in spite of heat and temperature.  Rapid breathing.  Blue lips.  Confusion and lethargy.  Difficulty being awakened.  Minimal urine production.  No tears. DIAGNOSIS  Your caregiver will diagnose dehydration based on your symptoms and your exam. Blood and urine tests will help confirm the diagnosis. The diagnostic evaluation should also identify the cause of dehydration. TREATMENT  Treatment of mild or moderate dehydration can often be done at home by increasing the amount of fluids that you drink. It is best to drink small amounts of fluid more often. Drinking too much at one time can make vomiting worse. Refer to the home care instructions below. Severe dehydration needs to be treated at the hospital where you will probably be given intravenous (IV) fluids that contain water and electrolytes. HOME CARE INSTRUCTIONS   Ask your caregiver about specific rehydration instructions.  Drink enough fluids to keep your urine clear or pale yellow.  Drink small amounts frequently if you have nausea and vomiting.  Eat as you normally do.  Avoid:  Foods or drinks high in sugar.  Carbonated  drinks.  Juice.  Extremely hot or cold fluids.  Drinks with caffeine.  Fatty, greasy foods.  Alcohol.  Tobacco.  Overeating.  Gelatin desserts.  Wash your hands well to avoid spreading bacteria and viruses.  Only take over-the-counter or prescription medicines for pain, discomfort, or fever as directed by your caregiver.  Ask your caregiver if you should continue all prescribed and over-the-counter medicines.  Keep all follow-up appointments with your caregiver. SEEK MEDICAL CARE IF:  You have abdominal pain and it increases or stays in one area (localizes).  You have a rash, stiff neck, or severe headache.  You are irritable, sleepy, or difficult to awaken.  You are weak, dizzy, or extremely thirsty. SEEK IMMEDIATE MEDICAL CARE IF:   You are unable to keep fluids down or you get worse despite treatment.  You have frequent episodes of vomiting or diarrhea.  You have blood or green matter (bile) in your vomit.  You have blood in your stool or your stool looks black and tarry.  You have not urinated in 6 to 8 hours, or you have only urinated a small amount of very dark urine.  You have a fever.  You faint. MAKE SURE YOU:   Understand these instructions.  Will watch your condition.  Will get help right away if you are not doing well or get worse. Document Released: 05/23/2005 Document Revised: 08/15/2011 Document Reviewed: 01/10/2011 ExitCare Patient Information 2015 [employer]. This information is not intended to replace advice given to you by your health care provider. Make sure you discuss any questions you have with your health care   provider.  

## 2014-10-10 NOTE — Progress Notes (Signed)
Visited with patient in Infusion where he was receiving IVF.  Upon inquiry, he stated he felt better after yesterday's IVF.  I informed him that Dr. Isidore Moos wants to see him this afternoon at 3:00.  He verbalized understanding.  Gayleen Orem, RN, BSN, [place] at [place] [phone]

## 2014-10-10 NOTE — Progress Notes (Signed)
Pain Status:No  Weight changes, if TDV:VOHYWV over last 30 days.  Nutritional Status a) intake:3 cans of ensure and 5 boxes of Breeze each day b) using a feeding tube?:Tentatively scheduled for Friday 10/17/14. c) weight changes, if PXT:GGYIRS stable last weight 158.3 on 09/22/14 and 160.3 today.  Swallowing Status:Eats some soft foods and drinks fluids.  Dental    When was last ENT visit?   When is next ENT visit?   Other notable issues, if WNI:OEVOJJK. BP 139/66 mmHg  Pulse 56  Wt 160 lb 4.8 oz (72.712 kg)  SpO2 100%  Wt Readings from Last 3 Encounters:  10/10/14 160 lb 4.8 oz (72.712 kg)  09/15/14 160 lb (72.576 kg)  08/29/14 164 lb (74.39 kg)

## 2014-10-13 ENCOUNTER — Ambulatory Visit: Admission: RE | Admit: 2014-10-13 | Payer: Medicare Other | Source: Ambulatory Visit | Admitting: Radiation Oncology

## 2014-10-14 ENCOUNTER — Telehealth: Payer: Self-pay | Admitting: *Deleted

## 2014-10-14 NOTE — Telephone Encounter (Signed)
I spoke with Sharyn Lull at Dr Ethlyn Gallery office to determine the urgency of surgery.  Sharyn Lull states that pt called yesterday, spoke with someone else in their office,  and told them that he did not want to have surgery right now. Sharyn Lull is going to contact patient today to determine whether or not he wants to have surgery. Sharyn Lull states she will be back in touch with me once she knows the status of surgery.

## 2014-10-14 NOTE — Telephone Encounter (Signed)
CALLED PATIENT TO INFORM OF IV FLUID APPTS., LVM FOR A RETURN CALL

## 2014-10-15 ENCOUNTER — Telehealth: Payer: Self-pay | Admitting: Cardiology

## 2014-10-15 ENCOUNTER — Telehealth: Payer: Self-pay | Admitting: Internal Medicine

## 2014-10-15 ENCOUNTER — Ambulatory Visit (INDEPENDENT_AMBULATORY_CARE_PROVIDER_SITE_OTHER): Payer: Medicare Other | Admitting: Internal Medicine

## 2014-10-15 ENCOUNTER — Encounter: Payer: Self-pay | Admitting: Internal Medicine

## 2014-10-15 VITALS — BP 96/60 | HR 68 | Temp 97.9°F | Resp 18 | Wt 155.8 lb

## 2014-10-15 DIAGNOSIS — J209 Acute bronchitis, unspecified: Secondary | ICD-10-CM | POA: Diagnosis not present

## 2014-10-15 MED ORDER — LEVOFLOXACIN 500 MG PO TABS: 500.0000 mg | ORAL_TABLET | Freq: Every day | ORAL | Status: DC

## 2014-10-15 NOTE — Progress Notes (Signed)
Pre visit review using our clinic review tool, if applicable. No additional management support is needed unless otherwise documented below in the visit note. 

## 2014-10-15 NOTE — Telephone Encounter (Signed)
I will route this message to [facility] for follow-up.

## 2014-10-15 NOTE — Telephone Encounter (Signed)
New message     For Adrian West Pt has not returned their call regarding surgical clearance.  Therefore, he can see Dr [name] at his next available appt

## 2014-10-15 NOTE — Progress Notes (Signed)
   Subjective:    Patient ID: Adrian West, male    DOB: [birth year], 81 y.o.   MRN: [id number]  HPI The patient is an 81 YO man who is coming in today for 8-9 days of productive cough. He thinks he may have had some chills last week. Denies fevers. Denies nasal congestion or sinus pressure. He is not having more SOB although he does have some baseline SOB. He is coughing up green stuff. It is not getting any better. He has tried mucinex over the counter which did not do much.   Review of Systems  Constitutional: Positive for chills and appetite change. Negative for fever, activity change, fatigue and unexpected weight change.  HENT: Negative.   Eyes: Negative.   Respiratory: Positive for cough and shortness of breath. Negative for chest tightness and wheezing.   Cardiovascular: Negative.   Gastrointestinal: Negative.       Objective:   Physical Exam  Constitutional: He is oriented to person, place, and time. He appears well-developed and well-nourished.  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 Oropharynx without changes or purulent discharge. Nose without erythema or swelling.  Eyes: EOM are normal.  Neck:  Does have some changes with his salivary gland cancer  Cardiovascular: Normal rate.   Pulmonary/Chest: Effort normal. No respiratory distress.  Scattered rhonchi in the lungs, no focal changes, partially clears with coughing  Abdominal: Soft. He exhibits no distension. There is no tenderness.  Neurological: He is alert and oriented to person, place, and time. Coordination normal.  Skin: Skin is warm and dry.   Filed Vitals:   10/15/14 0831  BP: 96/60  Pulse: 68  Temp: 97.9 F (36.6 C)  TempSrc: Oral  Resp: 18  Weight: 155 lb 12.8 oz (70.67 kg)  SpO2: 96%      Assessment & Plan:

## 2014-10-15 NOTE — Patient Instructions (Signed)
We have sent in the antibiotic called levaquin to your pharmacy. Take 1 pill a day until it is gone. It may take the cough another week or two to go away entirely but if it does not start to get better call and let us know.  You can ask the front to change doctors for you and your wife.   Acute Bronchitis Bronchitis is inflammation of the airways that extend from the windpipe into the lungs (bronchi). The inflammation often causes mucus to develop. This leads to a cough, which is the most common symptom of bronchitis.  In acute bronchitis, the condition usually develops suddenly and goes away over time, usually in a couple weeks. Smoking, allergies, and asthma can make bronchitis worse. Repeated episodes of bronchitis may cause further lung problems.  CAUSES Acute bronchitis is most often caused by the same virus that causes a cold. The virus can spread from person to person (contagious) through coughing, sneezing, and touching contaminated objects. SIGNS AND SYMPTOMS   Cough.   Fever.   Coughing up mucus.   Body aches.   Chest congestion.   Chills.   Shortness of breath.   Sore throat.  DIAGNOSIS  Acute bronchitis is usually diagnosed through a physical exam. Your health care provider will also ask you questions about your medical history. Tests, such as chest X-rays, are sometimes done to rule out other conditions.  TREATMENT  Acute bronchitis usually goes away in a couple weeks. Oftentimes, no medical treatment is necessary. Medicines are sometimes given for relief of fever or cough. Antibiotic medicines are usually not needed but may be prescribed in certain situations. In some cases, an inhaler may be recommended to help reduce shortness of breath and control the cough. A cool mist vaporizer may also be used to help thin bronchial secretions and make it easier to clear the chest.  HOME CARE INSTRUCTIONS  Get plenty of rest.   Drink enough fluids to keep your urine  clear or pale yellow (unless you have a medical condition that requires fluid restriction). Increasing fluids may help thin your respiratory secretions (sputum) and reduce chest congestion, and it will prevent dehydration.   Take medicines only as directed by your health care provider.  If you were prescribed an antibiotic medicine, finish it all even if you start to feel better.  Avoid smoking and secondhand smoke. Exposure to cigarette smoke or irritating chemicals will make bronchitis worse. If you are a smoker, consider using nicotine gum or skin patches to help control withdrawal symptoms. Quitting smoking will help your lungs heal faster.   Reduce the chances of another bout of acute bronchitis by washing your hands frequently, avoiding people with cold symptoms, and trying not to touch your hands to your mouth, nose, or eyes.   Keep all follow-up visits as directed by your health care provider.  SEEK MEDICAL CARE IF: Your symptoms do not improve after 1 week of treatment.  SEEK IMMEDIATE MEDICAL CARE IF:  You develop an increased fever or chills.   You have chest pain.   You have severe shortness of breath.  You have bloody sputum.   You develop dehydration.  You faint or repeatedly feel like you are going to pass out.  You develop repeated vomiting.  You develop a severe headache. MAKE SURE YOU:   Understand these instructions.  Will watch your condition.  Will get help right away if you are not doing well or get worse. Document Released: 06/30/2004 Document Revised:  10/07/2013 Document Reviewed: 11/13/2012 ExitCare Patient Information 2015 [place], [place]. This information is not intended to replace advice given to you by your health care provider. Make sure you discuss any questions you have with your health care provider.

## 2014-10-15 NOTE — Telephone Encounter (Signed)
Pt stated Dr. Donita Brooks for him to transfer to her from Dr. Ronnald Ramp. Just need to verify and an okey from both doctor.

## 2014-10-15 NOTE — Assessment & Plan Note (Signed)
Given his fragile health will treat with course of levaquin for clearance. If no improvement or worsening he will call us back.

## 2014-10-15 NOTE — Telephone Encounter (Signed)
Fine with me

## 2014-10-16 ENCOUNTER — Telehealth: Payer: Self-pay | Admitting: *Deleted

## 2014-10-16 NOTE — Telephone Encounter (Signed)
Patient called to confirm date/time for upcoming IVF appts. He also reported: 1. Oral intake has improved.  Last HS he ate meatloaf, custard.  Now enjoys the taste of coffee, is drinking decaff. 2. He continues to drink 3 Ensure and 4-5 Breeze daily. 3. He is having BMs every 2-3 days with aid of Senna. He understands he can contact me with needs/concerns.  Gayleen Orem, RN, BSN, [place] at [place] [phone]

## 2014-10-17 ENCOUNTER — Other Ambulatory Visit: Payer: Self-pay | Admitting: Radiation Oncology

## 2014-10-17 ENCOUNTER — Ambulatory Visit (HOSPITAL_BASED_OUTPATIENT_CLINIC_OR_DEPARTMENT_OTHER): Payer: Medicare Other

## 2014-10-17 ENCOUNTER — Encounter: Payer: Self-pay | Admitting: *Deleted

## 2014-10-17 VITALS — BP 127/53 | HR 68 | Temp 97.4°F | Resp 18

## 2014-10-17 DIAGNOSIS — C770 Secondary and unspecified malignant neoplasm of lymph nodes of head, face and neck: Secondary | ICD-10-CM

## 2014-10-17 DIAGNOSIS — C089 Malignant neoplasm of major salivary gland, unspecified: Secondary | ICD-10-CM | POA: Diagnosis not present

## 2014-10-17 MED ORDER — SODIUM CHLORIDE 0.9 % IV SOLN
Freq: Once | INTRAVENOUS | Status: AC
  Administered 2014-10-17: 09:00:00 via INTRAVENOUS
  Filled 2014-10-17: qty 1000

## 2014-10-17 MED ORDER — POTASSIUM CHLORIDE ER 10 MEQ PO TBCR: 10.0000 meq | EXTENDED_RELEASE_TABLET | Freq: Three times a day (TID) | ORAL | Status: DC

## 2014-10-17 NOTE — Telephone Encounter (Signed)
Barkley Boards, RN at 10/15/2014 5:17 PM     Status: Signed        I will route this message to [facility] for follow-up.             Chauncey Reading Price at 10/15/2014 4:44 PM     Status: Signed        New message  For Anne Pt has not returned their call regarding surgical clearance. Therefore, he can see Dr [name] at his next available appt       Copied from 10/15/14 phone note.

## 2014-10-17 NOTE — Progress Notes (Signed)
To provide support and encouragement, care continuity and to assess for needs, met with patient in Infusion where he was receiving IVF. 1. He reported:  Absence of throat pain.  Improved oral intake. 2. Upon inquiry, he indicated he was not taking liquid potassium as prescribed by Dr. Isidore Moos d/t bitterness.  He indicated he was taking instead ca. 4-5 tabs potassium gluconate 550 mg, asked about equivalency to 80 mEq prescribed by Dr. Isidore Moos. 3. Infusion pharmacist calculated equivalency, confirmed 4-5 tabs inadequate which I communicated to patient. 4. Patient request tablet form for potassium, I notified Dr. Isidore Moos, delivered new Rx to him.  Gayleen Orem, RN, BSN, [place] at [place] [phone]

## 2014-10-17 NOTE — Patient Instructions (Signed)
Dehydration, Adult Dehydration is when you lose more fluids from the body than you take in. Vital organs like the kidneys, brain, and heart cannot function without a proper amount of fluids and salt. Any loss of fluids from the body can cause dehydration.  CAUSES   Vomiting.  Diarrhea.  Excessive sweating.  Excessive urine output.  Fever. SYMPTOMS  Mild dehydration  Thirst.  Dry lips.  Slightly dry mouth. Moderate dehydration  Very dry mouth.  Sunken eyes.  Skin does not bounce back quickly when lightly pinched and released.  Dark urine and decreased urine production.  Decreased tear production.  Headache. Severe dehydration  Very dry mouth.  Extreme thirst.  Rapid, weak pulse (more than 100 beats per minute at rest).  Cold hands and feet.  Not able to sweat in spite of heat and temperature.  Rapid breathing.  Blue lips.  Confusion and lethargy.  Difficulty being awakened.  Minimal urine production.  No tears. DIAGNOSIS  Your caregiver will diagnose dehydration based on your symptoms and your exam. Blood and urine tests will help confirm the diagnosis. The diagnostic evaluation should also identify the cause of dehydration. TREATMENT  Treatment of mild or moderate dehydration can often be done at home by increasing the amount of fluids that you drink. It is best to drink small amounts of fluid more often. Drinking too much at one time can make vomiting worse. Refer to the home care instructions below. Severe dehydration needs to be treated at the hospital where you will probably be given intravenous (IV) fluids that contain water and electrolytes. HOME CARE INSTRUCTIONS   Ask your caregiver about specific rehydration instructions.  Drink enough fluids to keep your urine clear or pale yellow.  Drink small amounts frequently if you have nausea and vomiting.  Eat as you normally do.  Avoid:  Foods or drinks high in sugar.  Carbonated  drinks.  Juice.  Extremely hot or cold fluids.  Drinks with caffeine.  Fatty, greasy foods.  Alcohol.  Tobacco.  Overeating.  Gelatin desserts.  Wash your hands well to avoid spreading bacteria and viruses.  Only take over-the-counter or prescription medicines for pain, discomfort, or fever as directed by your caregiver.  Ask your caregiver if you should continue all prescribed and over-the-counter medicines.  Keep all follow-up appointments with your caregiver. SEEK MEDICAL CARE IF:  You have abdominal pain and it increases or stays in one area (localizes).  You have a rash, stiff neck, or severe headache.  You are irritable, sleepy, or difficult to awaken.  You are weak, dizzy, or extremely thirsty. SEEK IMMEDIATE MEDICAL CARE IF:   You are unable to keep fluids down or you get worse despite treatment.  You have frequent episodes of vomiting or diarrhea.  You have blood or green matter (bile) in your vomit.  You have blood in your stool or your stool looks black and tarry.  You have not urinated in 6 to 8 hours, or you have only urinated a small amount of very dark urine.  You have a fever.  You faint. MAKE SURE YOU:   Understand these instructions.  Will watch your condition.  Will get help right away if you are not doing well or get worse. Document Released: 05/23/2005 Document Revised: 08/15/2011 Document Reviewed: 01/10/2011 ExitCare Patient Information 2015 [employer]. This information is not intended to replace advice given to you by your health care provider. Make sure you discuss any questions you have with your health care   provider.  

## 2014-10-17 NOTE — Telephone Encounter (Signed)
See phone note dated 10/07/14

## 2014-10-19 NOTE — Telephone Encounter (Signed)
yes

## 2014-10-20 NOTE — Telephone Encounter (Signed)
Pt states that he is not having surgery by Dr Marlou Starks at this time.   Pt denies any new cardiac symptoms, pt advised I have scheduled him to see Dr [name] 11/04/14.

## 2014-10-22 DIAGNOSIS — C770 Secondary and unspecified malignant neoplasm of lymph nodes of head, face and neck: Secondary | ICD-10-CM | POA: Diagnosis not present

## 2014-10-22 DIAGNOSIS — G510 Bell's palsy: Secondary | ICD-10-CM | POA: Diagnosis not present

## 2014-10-22 DIAGNOSIS — H02102 Unspecified ectropion of right lower eyelid: Secondary | ICD-10-CM | POA: Diagnosis not present

## 2014-10-22 DIAGNOSIS — Z09 Encounter for follow-up examination after completed treatment for conditions other than malignant neoplasm: Secondary | ICD-10-CM | POA: Diagnosis not present

## 2014-10-22 DIAGNOSIS — Z9889 Other specified postprocedural states: Secondary | ICD-10-CM | POA: Diagnosis not present

## 2014-10-23 ENCOUNTER — Telehealth: Payer: Self-pay | Admitting: *Deleted

## 2014-10-23 NOTE — Telephone Encounter (Signed)
CALLED PATIENT TO REMIND OF IV FLUIDS FOR 10-24-14 @ 8 AM, SPOKE WITH PATIENT AND HE IS AWARE OF THIS APPT.

## 2014-10-24 ENCOUNTER — Ambulatory Visit (HOSPITAL_BASED_OUTPATIENT_CLINIC_OR_DEPARTMENT_OTHER): Payer: Medicare Other

## 2014-10-24 VITALS — BP 94/30 | HR 66 | Temp 97.9°F | Resp 18

## 2014-10-24 DIAGNOSIS — C089 Malignant neoplasm of major salivary gland, unspecified: Secondary | ICD-10-CM | POA: Diagnosis not present

## 2014-10-24 MED ORDER — SODIUM CHLORIDE 0.9 % IV SOLN
Freq: Once | INTRAVENOUS | Status: AC
  Administered 2014-10-24: 08:00:00 via INTRAVENOUS
  Filled 2014-10-24: qty 1000

## 2014-10-24 NOTE — Patient Instructions (Signed)
Dehydration, Adult Dehydration is when you lose more fluids from the body than you take in. Vital organs like the kidneys, brain, and heart cannot function without a proper amount of fluids and salt. Any loss of fluids from the body can cause dehydration.  CAUSES   Vomiting.  Diarrhea.  Excessive sweating.  Excessive urine output.  Fever. SYMPTOMS  Mild dehydration  Thirst.  Dry lips.  Slightly dry mouth. Moderate dehydration  Very dry mouth.  Sunken eyes.  Skin does not bounce back quickly when lightly pinched and released.  Dark urine and decreased urine production.  Decreased tear production.  Headache. Severe dehydration  Very dry mouth.  Extreme thirst.  Rapid, weak pulse (more than 100 beats per minute at rest).  Cold hands and feet.  Not able to sweat in spite of heat and temperature.  Rapid breathing.  Blue lips.  Confusion and lethargy.  Difficulty being awakened.  Minimal urine production.  No tears. DIAGNOSIS  Your caregiver will diagnose dehydration based on your symptoms and your exam. Blood and urine tests will help confirm the diagnosis. The diagnostic evaluation should also identify the cause of dehydration. TREATMENT  Treatment of mild or moderate dehydration can often be done at home by increasing the amount of fluids that you drink. It is best to drink small amounts of fluid more often. Drinking too much at one time can make vomiting worse. Refer to the home care instructions below. Severe dehydration needs to be treated at the hospital where you will probably be given intravenous (IV) fluids that contain water and electrolytes. HOME CARE INSTRUCTIONS   Ask your caregiver about specific rehydration instructions.  Drink enough fluids to keep your urine clear or pale yellow.  Drink small amounts frequently if you have nausea and vomiting.  Eat as you normally do.  Avoid:  Foods or drinks high in sugar.  Carbonated  drinks.  Juice.  Extremely hot or cold fluids.  Drinks with caffeine.  Fatty, greasy foods.  Alcohol.  Tobacco.  Overeating.  Gelatin desserts.  Wash your hands well to avoid spreading bacteria and viruses.  Only take over-the-counter or prescription medicines for pain, discomfort, or fever as directed by your caregiver.  Ask your caregiver if you should continue all prescribed and over-the-counter medicines.  Keep all follow-up appointments with your caregiver. SEEK MEDICAL CARE IF:  You have abdominal pain and it increases or stays in one area (localizes).  You have a rash, stiff neck, or severe headache.  You are irritable, sleepy, or difficult to awaken.  You are weak, dizzy, or extremely thirsty. SEEK IMMEDIATE MEDICAL CARE IF:   You are unable to keep fluids down or you get worse despite treatment.  You have frequent episodes of vomiting or diarrhea.  You have blood or green matter (bile) in your vomit.  You have blood in your stool or your stool looks black and tarry.  You have not urinated in 6 to 8 hours, or you have only urinated a small amount of very dark urine.  You have a fever.  You faint. MAKE SURE YOU:   Understand these instructions.  Will watch your condition.  Will get help right away if you are not doing well or get worse. Document Released: 05/23/2005 Document Revised: 08/15/2011 Document Reviewed: 01/10/2011 ExitCare Patient Information 2015 [employer]. This information is not intended to replace advice given to you by your health care provider. Make sure you discuss any questions you have with your health care   provider.  

## 2014-10-27 ENCOUNTER — Encounter (HOSPITAL_COMMUNITY): Payer: Self-pay | Admitting: Neurology

## 2014-10-27 ENCOUNTER — Emergency Department (HOSPITAL_COMMUNITY): Payer: Medicare Other

## 2014-10-27 ENCOUNTER — Ambulatory Visit: Payer: Self-pay | Admitting: Internal Medicine

## 2014-10-27 ENCOUNTER — Observation Stay (HOSPITAL_COMMUNITY)
Admission: EM | Admit: 2014-10-27 | Discharge: 2014-10-29 | Disposition: A | Discharge status: Home / Self Care | Payer: Medicare Other | Attending: Internal Medicine | Admitting: Internal Medicine

## 2014-10-27 ENCOUNTER — Other Ambulatory Visit: Payer: Self-pay

## 2014-10-27 DIAGNOSIS — M4806 Spinal stenosis, lumbar region: Secondary | ICD-10-CM | POA: Diagnosis not present

## 2014-10-27 DIAGNOSIS — I080 Rheumatic disorders of both mitral and aortic valves: Secondary | ICD-10-CM | POA: Diagnosis not present

## 2014-10-27 DIAGNOSIS — I493 Ventricular premature depolarization: Secondary | ICD-10-CM | POA: Diagnosis not present

## 2014-10-27 DIAGNOSIS — R42 Dizziness and giddiness: Secondary | ICD-10-CM | POA: Diagnosis not present

## 2014-10-27 DIAGNOSIS — R0602 Shortness of breath: Secondary | ICD-10-CM | POA: Diagnosis not present

## 2014-10-27 DIAGNOSIS — I10 Essential (primary) hypertension: Secondary | ICD-10-CM | POA: Insufficient documentation

## 2014-10-27 DIAGNOSIS — G510 Bell's palsy: Secondary | ICD-10-CM | POA: Diagnosis not present

## 2014-10-27 DIAGNOSIS — Z85828 Personal history of other malignant neoplasm of skin: Secondary | ICD-10-CM | POA: Diagnosis not present

## 2014-10-27 DIAGNOSIS — Z8673 Personal history of transient ischemic attack (TIA), and cerebral infarction without residual deficits: Secondary | ICD-10-CM | POA: Insufficient documentation

## 2014-10-27 DIAGNOSIS — D472 Monoclonal gammopathy: Secondary | ICD-10-CM | POA: Diagnosis not present

## 2014-10-27 DIAGNOSIS — J986 Disorders of diaphragm: Secondary | ICD-10-CM | POA: Insufficient documentation

## 2014-10-27 DIAGNOSIS — G629 Polyneuropathy, unspecified: Secondary | ICD-10-CM | POA: Diagnosis not present

## 2014-10-27 DIAGNOSIS — K5790 Diverticulosis of intestine, part unspecified, without perforation or abscess without bleeding: Secondary | ICD-10-CM | POA: Diagnosis not present

## 2014-10-27 DIAGNOSIS — H9190 Unspecified hearing loss, unspecified ear: Secondary | ICD-10-CM | POA: Diagnosis not present

## 2014-10-27 DIAGNOSIS — E43 Unspecified severe protein-calorie malnutrition: Secondary | ICD-10-CM | POA: Insufficient documentation

## 2014-10-27 DIAGNOSIS — Z86018 Personal history of other benign neoplasm: Secondary | ICD-10-CM | POA: Insufficient documentation

## 2014-10-27 DIAGNOSIS — Z79899 Other long term (current) drug therapy: Secondary | ICD-10-CM | POA: Diagnosis not present

## 2014-10-27 DIAGNOSIS — K222 Esophageal obstruction: Secondary | ICD-10-CM | POA: Insufficient documentation

## 2014-10-27 DIAGNOSIS — J984 Other disorders of lung: Secondary | ICD-10-CM | POA: Insufficient documentation

## 2014-10-27 DIAGNOSIS — G519 Disorder of facial nerve, unspecified: Secondary | ICD-10-CM | POA: Insufficient documentation

## 2014-10-27 DIAGNOSIS — K449 Diaphragmatic hernia without obstruction or gangrene: Secondary | ICD-10-CM | POA: Insufficient documentation

## 2014-10-27 DIAGNOSIS — R001 Bradycardia, unspecified: Secondary | ICD-10-CM | POA: Diagnosis not present

## 2014-10-27 DIAGNOSIS — Z85858 Personal history of malignant neoplasm of other endocrine glands: Secondary | ICD-10-CM | POA: Diagnosis not present

## 2014-10-27 DIAGNOSIS — Z7982 Long term (current) use of aspirin: Secondary | ICD-10-CM | POA: Diagnosis not present

## 2014-10-27 DIAGNOSIS — C4492 Squamous cell carcinoma of skin, unspecified: Secondary | ICD-10-CM

## 2014-10-27 DIAGNOSIS — R05 Cough: Principal | ICD-10-CM | POA: Insufficient documentation

## 2014-10-27 DIAGNOSIS — I951 Orthostatic hypotension: Secondary | ICD-10-CM

## 2014-10-27 DIAGNOSIS — E785 Hyperlipidemia, unspecified: Secondary | ICD-10-CM | POA: Insufficient documentation

## 2014-10-27 DIAGNOSIS — K219 Gastro-esophageal reflux disease without esophagitis: Secondary | ICD-10-CM | POA: Diagnosis present

## 2014-10-27 DIAGNOSIS — R059 Cough, unspecified: Secondary | ICD-10-CM

## 2014-10-27 DIAGNOSIS — J209 Acute bronchitis, unspecified: Secondary | ICD-10-CM | POA: Diagnosis present

## 2014-10-27 DIAGNOSIS — I499 Cardiac arrhythmia, unspecified: Secondary | ICD-10-CM | POA: Diagnosis not present

## 2014-10-27 DIAGNOSIS — Z7189 Other specified counseling: Secondary | ICD-10-CM

## 2014-10-27 DIAGNOSIS — E039 Hypothyroidism, unspecified: Secondary | ICD-10-CM | POA: Diagnosis not present

## 2014-10-27 DIAGNOSIS — I959 Hypotension, unspecified: Secondary | ICD-10-CM | POA: Diagnosis not present

## 2014-10-27 DIAGNOSIS — J849 Interstitial pulmonary disease, unspecified: Secondary | ICD-10-CM | POA: Diagnosis not present

## 2014-10-27 DIAGNOSIS — Z792 Long term (current) use of antibiotics: Secondary | ICD-10-CM | POA: Insufficient documentation

## 2014-10-27 DIAGNOSIS — R1312 Dysphagia, oropharyngeal phase: Secondary | ICD-10-CM

## 2014-10-27 DIAGNOSIS — G250 Essential tremor: Secondary | ICD-10-CM | POA: Diagnosis not present

## 2014-10-27 HISTORY — DX: Reserved for concepts with insufficient information to code with codable children: IMO0002

## 2014-10-27 LAB — COMPREHENSIVE METABOLIC PANEL
ALT: 15 U/L — AB (ref 17–63)
AST: 20 U/L (ref 15–41)
Albumin: 3.5 g/dL (ref 3.5–5.0)
Alkaline Phosphatase: 62 U/L (ref 38–126)
Anion gap: 8 (ref 5–15)
BUN: 15 mg/dL (ref 6–20)
CO2: 24 mmol/L (ref 22–32)
CREATININE: 0.78 mg/dL (ref 0.61–1.24)
Calcium: 8.9 mg/dL (ref 8.9–10.3)
Chloride: 105 mmol/L (ref 101–111)
GFR calc Af Amer: >60 mL/min (ref >60)
GFR calc non Af Amer: >60 mL/min (ref >60)
Glucose, Bld: 75 mg/dL (ref 65–99)
POTASSIUM: 4.7 mmol/L (ref 3.5–5.1)
SODIUM: 137 mmol/L (ref 135–145)
Total Bilirubin: 1 mg/dL (ref 0.3–1.2)
Total Protein: 5.2 g/dL — ABNORMAL LOW (ref 6.5–8.1)

## 2014-10-27 LAB — TSH: TSH: 4.448 u[IU]/mL (ref 0.350–4.500)

## 2014-10-27 LAB — URINALYSIS, ROUTINE W REFLEX MICROSCOPIC
Bilirubin Urine: NEGATIVE
Glucose, UA: NEGATIVE mg/dL
HGB URINE DIPSTICK: NEGATIVE
KETONES UR: NEGATIVE mg/dL
Leukocytes, UA: NEGATIVE
Nitrite: NEGATIVE
PROTEIN: NEGATIVE mg/dL
Specific Gravity, Urine: 1.004 — ABNORMAL LOW (ref 1.005–1.030)
Urobilinogen, UA: 0.2 mg/dL (ref 0.0–1.0)
pH: 6 (ref 5.0–8.0)

## 2014-10-27 LAB — CBC WITH DIFFERENTIAL/PLATELET
BASOS PCT: 0 % (ref 0–1)
Basophils Absolute: 0 10*3/uL (ref 0.0–0.1)
EOS PCT: 4 % (ref 0–5)
Eosinophils Absolute: 0.2 10*3/uL (ref 0.0–0.7)
HEMATOCRIT: 38.3 % — AB (ref 39.0–52.0)
Hemoglobin: 12.8 g/dL — ABNORMAL LOW (ref 13.0–17.0)
LYMPHS ABS: 0.8 10*3/uL (ref 0.7–4.0)
Lymphocytes Relative: 15 % (ref 12–46)
MCH: 29.9 pg (ref 26.0–34.0)
MCHC: 33.4 g/dL (ref 30.0–36.0)
MCV: 89.5 fL (ref 78.0–100.0)
MONO ABS: 0.4 10*3/uL (ref 0.1–1.0)
MONOS PCT: 7 % (ref 3–12)
NEUTROS ABS: 4.3 10*3/uL (ref 1.7–7.7)
NEUTROS PCT: 74 % (ref 43–77)
PLATELETS: 189 10*3/uL (ref 150–400)
RBC: 4.28 MIL/uL (ref 4.22–5.81)
RDW: 15.2 % (ref 11.5–15.5)
WBC: 5.7 10*3/uL (ref 4.0–10.5)

## 2014-10-27 LAB — I-STAT CHEM 8, ED
BUN: 17 mg/dL (ref 6–20)
CALCIUM ION: 1.18 mmol/L (ref 1.13–1.30)
Chloride: 105 mmol/L (ref 101–111)
Creatinine, Ser: 0.8 mg/dL (ref 0.61–1.24)
Glucose, Bld: 70 mg/dL (ref 65–99)
HCT: 41 % (ref 39.0–52.0)
Hemoglobin: 13.9 g/dL (ref 13.0–17.0)
Potassium: 4.7 mmol/L (ref 3.5–5.1)
Sodium: 135 mmol/L (ref 135–145)
TCO2: 22 mmol/L (ref 0–100)

## 2014-10-27 LAB — CORTISOL: CORTISOL PLASMA: 5.4 ug/dL

## 2014-10-27 LAB — I-STAT TROPONIN, ED: Troponin i, poc: 0 ng/mL (ref 0.00–0.08)

## 2014-10-27 LAB — MAGNESIUM: Magnesium: 2.1 mg/dL (ref 1.7–2.4)

## 2014-10-27 LAB — TROPONIN I: Troponin I: <0.03 ng/mL

## 2014-10-27 LAB — GLUCOSE, CAPILLARY: GLUCOSE-CAPILLARY: 135 mg/dL — AB (ref 65–99)

## 2014-10-27 LAB — I-STAT CG4 LACTIC ACID, ED: Lactic Acid, Venous: 1.96 mmol/L (ref 0.5–2.0)

## 2014-10-27 MED ORDER — SODIUM CHLORIDE 0.9 % IJ SOLN
3.0000 mL | Freq: Two times a day (BID) | INTRAMUSCULAR | Status: DC
  Administered 2014-10-27 – 2014-10-28 (×2): 3 mL via INTRAVENOUS

## 2014-10-27 MED ORDER — ENOXAPARIN SODIUM 40 MG/0.4ML ~~LOC~~ SOLN
40.0000 mg | SUBCUTANEOUS | Status: DC
  Filled 2014-10-27: qty 0.4

## 2014-10-27 MED ORDER — ASPIRIN EC 81 MG PO TBEC
81.0000 mg | DELAYED_RELEASE_TABLET | Freq: Every day | ORAL | Status: DC
  Administered 2014-10-27 – 2014-10-28 (×2): 81 mg via ORAL
  Filled 2014-10-27 (×3): qty 1

## 2014-10-27 MED ORDER — ONDANSETRON HCL 4 MG/2ML IJ SOLN: 4.0000 mg | Freq: Four times a day (QID) | INTRAMUSCULAR | Status: DC | PRN

## 2014-10-27 MED ORDER — MIRTAZAPINE 15 MG PO TABS
15.0000 mg | ORAL_TABLET | Freq: Every day | ORAL | Status: DC
  Administered 2014-10-27 – 2014-10-28 (×2): 15 mg via ORAL
  Filled 2014-10-27 (×3): qty 1

## 2014-10-27 MED ORDER — ACETAMINOPHEN 650 MG RE SUPP: 650.0000 mg | Freq: Four times a day (QID) | RECTAL | Status: DC | PRN

## 2014-10-27 MED ORDER — ATORVASTATIN CALCIUM 10 MG PO TABS
10.0000 mg | ORAL_TABLET | Freq: Every day | ORAL | Status: DC
  Administered 2014-10-27 – 2014-10-28 (×2): 10 mg via ORAL
  Filled 2014-10-27 (×3): qty 1

## 2014-10-27 MED ORDER — BOOST / RESOURCE BREEZE PO LIQD
1.0000 | Freq: Three times a day (TID) | ORAL | Status: DC
Start: 2014-10-27 — End: 2014-10-28
  Administered 2014-10-27 – 2014-10-28 (×3): 1 via ORAL

## 2014-10-27 MED ORDER — HYPROMELLOSE (GONIOSCOPIC) 2.5 % OP SOLN
1.0000 [drp] | OPHTHALMIC | Status: DC | PRN
  Filled 2014-10-27: qty 15

## 2014-10-27 MED ORDER — ERYTHROMYCIN 5 MG/GM OP OINT
1.0000 "application " | TOPICAL_OINTMENT | Freq: Two times a day (BID) | OPHTHALMIC | Status: DC
  Administered 2014-10-27 – 2014-10-28 (×3): 1 via OPHTHALMIC
  Filled 2014-10-27: qty 3.5

## 2014-10-27 MED ORDER — SODIUM CHLORIDE 0.9 % IV BOLUS (SEPSIS)
1000.0000 mL | Freq: Once | INTRAVENOUS | Status: AC
  Administered 2014-10-27: 1000 mL via INTRAVENOUS

## 2014-10-27 MED ORDER — ONDANSETRON HCL 4 MG PO TABS: 4.0000 mg | ORAL_TABLET | Freq: Four times a day (QID) | ORAL | Status: DC | PRN

## 2014-10-27 MED ORDER — ENSURE PUDDING PO PUDG
1.0000 | Freq: Three times a day (TID) | ORAL | Status: DC
  Administered 2014-10-28 (×2): 1 via ORAL
  Filled 2014-10-27 (×6): qty 1

## 2014-10-27 MED ORDER — POLYETHYLENE GLYCOL 3350 17 G PO PACK
17.0000 g | PACK | Freq: Every day | ORAL | Status: DC | PRN
  Filled 2014-10-27: qty 1

## 2014-10-27 MED ORDER — LEVOTHYROXINE SODIUM 125 MCG PO TABS
125.0000 ug | ORAL_TABLET | Freq: Every day | ORAL | Status: DC
  Administered 2014-10-28 – 2014-10-29 (×2): 125 ug via ORAL
  Filled 2014-10-27 (×3): qty 1

## 2014-10-27 MED ORDER — ACETAMINOPHEN 325 MG PO TABS: 650.0000 mg | ORAL_TABLET | Freq: Four times a day (QID) | ORAL | Status: DC | PRN

## 2014-10-27 NOTE — Progress Notes (Addendum)
Admission note:  Arrival Method: Patient arrived on stretcher with daughter and staff accompanying. Mental Orientation:  Alert and oriented x 4. Telemetry: 6E-19, SB, P 56. Assessment:  See doc flow sheets. Skin: Dry scabs on the head, bruising on both arms,  2 nurses assessed Adrian West).  Warm, dry and intact.  Right ear partially amputated.  Right side droop noted.  Rash on the left hand noted. IV: Right FA saline lock.   Pain: Denies any pain at present. Safety Measures:  Bed in low position, phone and call bell at bedside. Fall Prevention Safety Plan: Reviewed and reinforced fall prevention safety plan, understood and acknowledged. Admission Screening: IN progress. 6700 Orientation: Patient has been oriented to the unit, staff and to the room.

## 2014-10-27 NOTE — ED Notes (Signed)
EPI drip stopped per Dr. Lenna Sciara, oxygen turned off.

## 2014-10-27 NOTE — ED Provider Notes (Signed)
Patient reports that he went to nurse at [facility] this morning for recheck of "bronchitis. Pulse oximetry noted to be low. EMS was called noted patient be hypotensive EMS administered saline 300 mL intravenously and started patient on epinephrine if intravenous drip. Patient was started on supplemental oxygen at [facility]. Patient presently states "I feel well" he states that he felt as if he were improving from his bronchitis. On exam patient alert Glasgow Coma Score 15 no distress.   Orlie Dakin, MD 10/27/14 [phone]
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
## 2014-10-27 NOTE — ED Provider Notes (Signed)
CSN: [id number]     Arrival date & time 10/27/14  1021 History   First MD Initiated Contact with Patient 10/27/14 1037     Chief Complaint  Patient presents with  . Hypotension     (Consider location/radiation/quality/duration/timing/severity/associated sxs/prior Treatment) Patient is a 81 y.o. male presenting with dizziness. The history is provided by the patient and the EMS personnel.  Dizziness Quality:  Lightheadedness Severity:  Moderate Onset quality:  Sudden Duration:  1 hour Timing:  Constant Progression:  Resolved Chronicity:  New Relieved by:  Nothing Worsened by:  Nothing Ineffective treatments:  None tried Associated symptoms: shortness of breath   Associated symptoms: no blood in stool, no chest pain, no diarrhea, no headaches, no nausea, no palpitations, no syncope, no vision changes, no vomiting and no weakness   Shortness of breath:    Severity:  Moderate   Onset quality:  Sudden   Duration:  1 hour   Timing:  Constant   Progression:  Resolved   Past Medical History  Diagnosis Date  . Benign neoplasm of colon   . Tremor, essential   . Diverticulosis   . TIA (transient ischemic attack) May 2012  . Neuropathy, peripheral   . Lung abnormality 11/25/2008    "nerve damage from heart surgery"  . MGUS (monoclonal gammopathy of unknown significance) 04/11/2013  . Diaphragm paralysis   . Esophageal stricture   . Hiatal hernia   . Bell's palsy   . Vertigo 2005  . Gait disturbance   . History of radiation therapy 06/11/14-07/22/14    skin cancer of scalp/face  . Anorexia 08/16/2014  . 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 DNR (do not resuscitate) discussion 07/22/2014  . Dyspnea and respiratory abnormality 03/10/2014  . DYSPNEA/SHORTNESS OF BREATH 02/11/2010    R hemidiaphragm paralysis post mitral valve surgery 11/2008 with resultant Class 3-4 dyspnea; restrictive pattern on PFTs in excess of compromise expected with diaphragm injury (Note: ? Interstitial lung disease  10/15/13 Xray [facility]) 01/02/12 new subsegmental ATX on L (vs 09/03/11) Seeing Dr Theda Sers [facility]  Seen by Dr Corrin Parker, [facility],  02/2011. Component of thoracic height loss & alcohol related neuropathy questioned. No plication recommended  5/4- 10/14/2013 @ [facility] ; Dr  Ellis Parents.10/15/13 new focal consolidation mid anterior L lung:aspiration vs PNA . Underlying interstitial lung disease.10/15/13 Rx sent to [facility] for Doxycycline & Augmentin by Dr Jeannine Kitten ( not picked up as of 10/18/13).  Flew back to [facility]; seen @ 10/16/13 [facility], [place]. Levaquin X 5 days , Dr Evalee Mutton    . GERD 02/14/2008    Qualifier: Diagnosis of  By: [name] MD, William    . HYPERTENSION 02/14/2008        . HYPOTHYROIDISM 02/14/2008  . Loss of weight 07/22/2014  . Memory loss 12/31/2009    Qualifier: Diagnosis of  By: [name] MD, [name]    . MITRAL REGURGITATION 02/14/2008    Qualifier: Diagnosis of  By: [name] MD, Cherlynn Kaiser MV surgery 11/2008, [name].complcated by paralysis of diaphragm     . 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 Other and unspecified hyperlipidemia 02/24/2012  . Polycythemia, secondary 01/10/2014  . PVC's (premature ventricular contractions) 04/05/2011  . SPINAL STENOSIS, LUMBAR 07/28/2010    Qualifier: Diagnosis of  By: [name] MD, [name],  NS ( retired)    . Weakness generalized 07/22/2014  . Interstitial lung disease 05/05/2014  . Vertigo 2005  . Deaf   . Tremor 2015  . Squamous carcinoma   . 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 Skin cancer of scalp or skin of neck 05/21/2014   Past Surgical History  Procedure Laterality Date  . Appendectomy    . Cataract extraction      [name]  . Cholecystectomy  1998    [name]  . Inguinal hernia repair     . Lumbar laminectomy    . Nasal sinus surgery    . Tonsillectomy    . Esophageal dilation       X3;[name]  . 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 Laparoscopic ablation renal mass  08/14/2009  . Mohs surgery  2012     ear  . Tilt table study N/A 05/06/2013    Procedure: TILT TABLE STUDY;  Surgeon: [name], MD;  Location: [facility] CATH LAB;  Service: Cardiovascular;  Laterality: N/A;  . Right heart catheterization N/A 03/03/2014    Procedure: RIGHT HEART CATH;  Surgeon: [name], MD;  Location: [facility] CATH LAB;  Service: Cardiovascular;  Laterality: N/A;  . Partial auriculectomy Right 04/25/2014  . Parotidectomy Right 04/25/2014    Sacrifice of the facial nerve  . Neck dissection Right 04/25/2014  . Brow lift Right 04/25/2014    Midforehead  . Ectropion repair Right 04/25/2014    Right tarsal strip  . Nasolabial repair Right 04/25/2014    Right static sling using AlloDerm graft elevation of the nasolabial fold  . Cardiac valve replacement    . Cardiac catheterization     Family History  Problem Relation Age of Onset  . Heart attack Father 56  . Colon cancer Mother 49  . Heart attack Maternal Grandmother 60  . Uterine cancer Paternal Grandmother   . Stroke Neg Hx   . Diabetes Neg Hx   . Healthy Sister    History  Substance Use Topics  . Smoking status: Never Smoker   . Smokeless tobacco: Never Used  . Alcohol Use: No     Comment: Drinks wine 3 per night    Review of Systems  Constitutional: Negative for fever, chills, diaphoresis, appetite change and fatigue.  Eyes: Negative for photophobia and visual disturbance.  Respiratory: Positive for shortness of breath. Negative for cough, chest tightness and wheezing.   Cardiovascular: Negative for chest pain, palpitations, leg swelling and syncope.  Gastrointestinal: Negative for nausea, vomiting, abdominal pain, diarrhea, blood in stool and abdominal  distention.  Genitourinary: Negative for dysuria and difficulty urinating.  Musculoskeletal: Negative for back pain, neck pain and neck stiffness.  Skin: Negative for color change, pallor and rash.  Neurological: Positive for dizziness and light-headedness. Negative for syncope, weakness, numbness and headaches.  All other systems reviewed and are negative.     Allergies  Review of patient's allergies indicates no known allergies.  Home Medications   Prior to Admission medications   Medication Sig Start Date End Date Taking? Authorizing Provider  aspirin EC 81 MG tablet Take 81 mg by mouth daily.   Yes Historical Provider, MD  atorvastatin (LIPITOR) 10 MG tablet TAKE ONE TABLET EACH DAY 10/08/14  Yes [name], MD  erythromycin ophthalmic ointment 1 application 2 (two) times daily. Place  a thin strip into the right eye twice daily   Yes Historical Provider, MD  GuaiFENesin (MUCINEX PO) Take 1 tablet by mouth daily as needed (congestion).   Yes Historical Provider, MD  Hypromellose (GENTEAL MILD OP) Apply 1 application to eye as needed (both eyes).   Yes Historical Provider, MD  levothyroxine (SYNTHROID, LEVOTHROID) 125 MCG tablet Take 1 tablet (125 mcg total) by mouth daily. 09/15/14  Yes Janith Lima, MD  loratadine (CLARITIN) 10 MG tablet Take 10 mg by mouth daily as needed (congestion).   Yes Historical Provider, MD  mirtazapine (REMERON) 15 MG tablet Take 1 tablet (15 mg total) by mouth at bedtime. 09/15/14  Yes Janith Lima, MD  feeding supplement, ENSURE ENLIVE, (ENSURE ENLIVE) LIQD Take 237 mLs by mouth 3 (three) times daily between meals. 10/29/14   Orson Eva, MD  feeding supplement, RESOURCE BREEZE, (RESOURCE BREEZE) LIQD Take 1 Container by mouth 5 (five) times daily. 10/29/14   Orson Eva, MD  UNABLE TO FIND Physical therapy and occupational therapy to evaluate and treat. diagonosis--deconditioning 10/29/14   Orson Eva, MD   BP 113/61 mmHg  Pulse 69  Temp(Src) 97.9 F  (36.6 C) (Oral)  Resp 18  Ht 5\' 10"  (1.778 m)  Wt 159 lb (72.122 kg)  BMI 22.81 kg/m2  SpO2 96% Physical Exam  Constitutional: He appears well-developed and well-nourished. No distress.  HENT:  Head: Normocephalic and atraumatic.  Mouth/Throat: Oropharynx is clear and moist.  Eyes: Conjunctivae and EOM are normal. Pupils are equal, round, and reactive to light.  Neck: Normal range of motion. Neck supple.  Cardiovascular: Normal rate, regular rhythm, normal heart sounds and intact distal pulses.  Exam reveals no gallop and no friction rub.   No murmur heard. Pulmonary/Chest: Effort normal and breath sounds normal. No respiratory distress. He has no wheezes. He has no rales.  Abdominal: Soft. Bowel sounds are normal. He exhibits no distension. There is no tenderness. There is no rebound and no guarding.  Musculoskeletal: He exhibits no edema or tenderness.  Neurological: He is alert. He has normal strength. No cranial nerve deficit or sensory deficit. He exhibits normal muscle tone. Coordination normal. GCS eye subscore is 4. GCS verbal subscore is 5. GCS motor subscore is 6.  Skin: Skin is warm and dry. No rash noted. He is not diaphoretic. No erythema. No pallor.  Nursing note and vitals reviewed.   ED Course  Procedures (including critical care time) Labs Review Labs Reviewed  COMPREHENSIVE METABOLIC PANEL - Abnormal; Notable for the following:    Total Protein 5.2 (*)    ALT 15 (*)    All other components within normal limits  CBC WITH DIFFERENTIAL/PLATELET - Abnormal; Notable for the following:    Hemoglobin 12.8 (*)    HCT 38.3 (*)    All other components within normal limits  URINALYSIS, ROUTINE W REFLEX MICROSCOPIC - Abnormal; Notable for the following:    Specific Gravity, Urine 1.004 (*)    All other components within normal limits  GLUCOSE, CAPILLARY - Abnormal; Notable for the following:    Glucose-Capillary 135 (*)    All other components within normal limits  CBC  - Abnormal; Notable for the following:    Hemoglobin 12.7 (*)    HCT 38.3 (*)    All other components within normal limits  BASIC METABOLIC PANEL - Abnormal; Notable for the following:    Glucose, Bld 101 (*)    Calcium 8.8 (*)    All other  components within normal limits  CBC - Abnormal; Notable for the following:    RBC 3.97 (*)    Hemoglobin 11.7 (*)    HCT 35.6 (*)    All other components within normal limits  BASIC METABOLIC PANEL - Abnormal; Notable for the following:    Calcium 8.8 (*)    All other components within normal limits  TSH  MAGNESIUM  TROPONIN I  CORTISOL  LACTIC ACID, PLASMA  TROPONIN I  ACTH STIMULATION, 3 TIME POINTS  I-STAT CHEM 8, ED  I-STAT CG4 LACTIC ACID, ED  I-STAT TROPOININ, ED    Imaging Review No results found.   EKG Interpretation   Date/Time:  Monday Oct 27 2014 10:26:59 EDT Ventricular Rate:  78 PR Interval:  131 QRS Duration: 142 QT Interval:  434 QTC Calculation: 494 R Axis:   68 Text Interpretation:  Sinus rhythm Ventricular bigeminy IVCD, consider  atypical RBBB Anteroseptal infarct, age indeterminate No significant  change since last tracing Confirmed by JACUBOWITZ  MD, SAM [phone]) on  10/27/2014 11:01:00 AM      MDM   Final diagnoses:  Cough    81 yo M with PMH of head and neck CA s/p excision and radiation treatment, HTN, mitral valve repair, presenting from ALF with reported hypoxia and hypotension.  Pt recently completed 7 day course of Levaquin for bronchitis.  Was feeling better after. However today came to nurse reporting dizziness, SOB.  Per Rozetta Nunnery, RN at facility- she took pt's vital signs and O2 sats in 52s with BP 80s/50s.  Placed on 3L O2 via [place], came up to 86% by the time EMS arrived.  EMS reports SBP as low as 50 on their arrival, O2 sats improved on 6L O2 via [place].  Pt given 300cc IVF and EMS reports pt having bigeminy on monitor with hypotension, and placed pt on Epi drip with reported improvement in BP.     On presentation, pt alert, in NAD, currently denies dizziness, SOB, chest pain.  EKG shows bigeminy, HR 78, no other acute findings.  Pt taken off Epi drip and O2 and vitals remaining stable.  Will workup for ACS vs infection vs electrolyte abnormality.    Workup reassuring. Pt remains stable and asymptomatic. However given concerning episode of hypoxia and hypotension will admit for further observation.  Admitted without other acute events during my care.  Discussed with attending Dr. Winfred Leeds.    Ellwood Dense, MD 10/30/14 [place], MD 10/31/14 [phone]

## 2014-10-27 NOTE — ED Notes (Signed)
Per EMS- Pt is coming from [facility], went up to nurses station c/o sob, dizzy. Initial BP for EMS 003 systolic. Bigemy noted to monitor r/t decrease in BP 50/26. EPI drip started at 2 mcg/min. Initial oxyen 89% RA, pt would not tolerate NRB. Pt has 20 gauge to right foot. Pt is alert.

## 2014-10-27 NOTE — ED Notes (Signed)
Dr  Noah Delaine short paged and notified of pt blood pressure.  No new orders at this time.   Imogene Burn notified as well.

## 2014-10-27 NOTE — ED Notes (Signed)
PLEASE CALL JOSIE (DAUGHTER) IF EMERGENCY CONTACT IS NEEDED [phone]

## 2014-10-28 ENCOUNTER — Observation Stay (HOSPITAL_BASED_OUTPATIENT_CLINIC_OR_DEPARTMENT_OTHER): Payer: Medicare Other

## 2014-10-28 ENCOUNTER — Observation Stay (HOSPITAL_COMMUNITY): Payer: Medicare Other

## 2014-10-28 DIAGNOSIS — R0600 Dyspnea, unspecified: Secondary | ICD-10-CM

## 2014-10-28 DIAGNOSIS — R05 Cough: Secondary | ICD-10-CM | POA: Diagnosis not present

## 2014-10-28 DIAGNOSIS — J209 Acute bronchitis, unspecified: Secondary | ICD-10-CM

## 2014-10-28 DIAGNOSIS — R1310 Dysphagia, unspecified: Secondary | ICD-10-CM | POA: Diagnosis not present

## 2014-10-28 DIAGNOSIS — I951 Orthostatic hypotension: Secondary | ICD-10-CM | POA: Diagnosis not present

## 2014-10-28 DIAGNOSIS — E038 Other specified hypothyroidism: Secondary | ICD-10-CM

## 2014-10-28 DIAGNOSIS — E43 Unspecified severe protein-calorie malnutrition: Secondary | ICD-10-CM | POA: Insufficient documentation

## 2014-10-28 LAB — CBC
HCT: 38.3 % — ABNORMAL LOW (ref 39.0–52.0)
Hemoglobin: 12.7 g/dL — ABNORMAL LOW (ref 13.0–17.0)
MCH: 29.8 pg (ref 26.0–34.0)
MCHC: 33.2 g/dL (ref 30.0–36.0)
MCV: 89.9 fL (ref 78.0–100.0)
PLATELETS: 203 10*3/uL (ref 150–400)
RBC: 4.26 MIL/uL (ref 4.22–5.81)
RDW: 15.3 % (ref 11.5–15.5)
WBC: 5.3 10*3/uL (ref 4.0–10.5)

## 2014-10-28 LAB — BASIC METABOLIC PANEL
ANION GAP: 7 (ref 5–15)
BUN: 15 mg/dL (ref 6–20)
CHLORIDE: 105 mmol/L (ref 101–111)
CO2: 25 mmol/L (ref 22–32)
CREATININE: 0.74 mg/dL (ref 0.61–1.24)
Calcium: 8.8 mg/dL — ABNORMAL LOW (ref 8.9–10.3)
GFR calc non Af Amer: >60 mL/min (ref >60)
GLUCOSE: 101 mg/dL — AB (ref 65–99)
Potassium: 4.2 mmol/L (ref 3.5–5.1)
Sodium: 137 mmol/L (ref 135–145)

## 2014-10-28 LAB — LACTIC ACID, PLASMA: LACTIC ACID, VENOUS: 1.4 mmol/L (ref 0.5–2.0)

## 2014-10-28 LAB — TROPONIN I: Troponin I: <0.03 ng/mL (ref <0.031)

## 2014-10-28 MED ORDER — COSYNTROPIN 0.25 MG IJ SOLR
0.2500 mg | Freq: Once | INTRAMUSCULAR | Status: AC
  Administered 2014-10-29: 0.25 mg via INTRAVENOUS
  Filled 2014-10-28: qty 0.25

## 2014-10-28 MED ORDER — BOOST / RESOURCE BREEZE PO LIQD
1.0000 | Freq: Every day | ORAL | Status: DC
  Administered 2014-10-28: 1 via ORAL

## 2014-10-28 MED ORDER — SODIUM CHLORIDE 0.9 % IV BOLUS (SEPSIS)
250.0000 mL | Freq: Once | INTRAVENOUS | Status: AC
  Administered 2014-10-28: 250 mL via INTRAVENOUS

## 2014-10-28 MED ORDER — COSYNTROPIN 0.25 MG IJ SOLR
0.2500 mg | Freq: Once | INTRAMUSCULAR | Status: DC
  Filled 2014-10-28: qty 0.25

## 2014-10-28 MED ORDER — ENSURE ENLIVE PO LIQD
237.0000 mL | Freq: Three times a day (TID) | ORAL | Status: DC
  Administered 2014-10-28: 237 mL via ORAL

## 2014-10-28 MED ORDER — SODIUM CHLORIDE 0.9 % IV SOLN
INTRAVENOUS | Status: AC
  Administered 2014-10-28: 75 mL/h via INTRAVENOUS
  Administered 2014-10-28: 20:00:00 via INTRAVENOUS

## 2014-10-28 NOTE — Progress Notes (Signed)
[facility] PROGRESS NOTE  Adrian West OYD:[id number] DOB: 09-08-1927 DOA: 10/27/2014  PCP: Olga Millers, MD  Brief HPI: Patient is a 81 year old Caucasian male with a past medical history of head and neck cancer involving the parotid gland and right ear, status post surgery and radiation treatment. Presented from his independent living facility with dizziness and was noted to have a blood pressure in the 80s and then subsequently the 60s. He was admitted to the hospital for further management.  Past medical history:  Past Medical History  Diagnosis Date  . Benign neoplasm of colon   . Tremor, essential   . Diverticulosis   . TIA (transient ischemic attack) May 2012  . Neuropathy, peripheral   . Lung abnormality 11/25/2008    "nerve damage from heart surgery"  . MGUS (monoclonal gammopathy of unknown significance) 04/11/2013  . Diaphragm paralysis   . Esophageal stricture   . Hiatal hernia   . Bell's palsy   . Vertigo 2005  . Gait disturbance   . History of radiation therapy 06/11/14-07/22/14    skin cancer of scalp/face  . Anorexia 08/16/2014  . 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 DNR (do not resuscitate) discussion 07/22/2014  . Dyspnea and respiratory abnormality 03/10/2014  . DYSPNEA/SHORTNESS OF BREATH 02/11/2010    R hemidiaphragm paralysis post mitral valve surgery 11/2008 with resultant Class 3-4 dyspnea; restrictive pattern on PFTs in excess of compromise expected with diaphragm injury (Note: ? Interstitial lung disease 10/15/13 Xray [facility]) 01/02/12 new subsegmental ATX on L (vs 09/03/11) Seeing Dr Theda Sers [facility]monary [facility]  Seen by Dr Corrin Parker, [facility],  02/2011. Component of thoracic height loss & alcohol related neuropathy questioned. No plication recommended  5/4- 10/14/2013 @ [facility] ; Dr  Ellis Parents.10/15/13 new focal consolidation mid anterior L lung:aspiration vs PNA . Underlying interstitial lung disease.10/15/13 Rx sent [facility]  for Doxycycline & Augmentin by Dr Jeannine Kitten ( not picked up as of 10/18/13).  Flew back to [facility]; seen @ 10/16/13 [facility], [place]. Levaquin X 5 days , Dr Evalee Mutton    . GERD 02/14/2008    Qualifier: Diagnosis of  By: [name] MD, William    . HYPERTENSION 02/14/2008        . HYPOTHYROIDISM 02/14/2008  . Loss of weight 07/22/2014  . Memory loss 12/31/2009    Qualifier: Diagnosis of  By: [name] MD, [name]    . MITRAL REGURGITATION 02/14/2008    Qualifier: Diagnosis of  By: [name] MD, Cherlynn Kaiser MV surgery 11/2008, [name].complcated by paralysis of diaphragm     . 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 Other and unspecified hyperlipidemia 02/24/2012  . Polycythemia, secondary 01/10/2014  . PVC's (premature ventricular contractions) 04/05/2011  . SPINAL STENOSIS, LUMBAR 07/28/2010    Qualifier: Diagnosis of  By: [name] MD, [name], NS ( retired)    . Weakness generalized 07/22/2014  . Interstitial lung disease 05/05/2014  . Vertigo 2005  . Deaf   . Tremor 2015  . Squamous carcinoma   . 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 Skin cancer of scalp or skin of neck 05/21/2014    Consultants: None  Procedures:  Echocardiogram Study Conclusions - Left ventricle: The cavity size was normal. There was mild  focalbasal hypertrophy of the septum. Systolic function was normal.The estimated ejection fraction was in the range of 60% to 65%.Wall motion was normal; there were no regional wall motionabnormalities. Doppler parameters are consistent with abnormalleft ventricular relaxation (grade 1 diastolic dysfunction). - Mitral valve: Prior procedures included surgical repair. Anannular ring prosthesis was present. The prosthesis had a normalrange of motion. The sewing ring appeared normal. - Left  atrium: The atrium was moderately dilated. - Right atrium: The atrium was mildly dilated. Impressions: - Compared to the prior study, there has been no significantinterval change.  Antibiotics: None. Patient recently completed a course of Levaquin for acute bronchitis.  Subjective: Patient feels well this morning. Still weak but better than yesterday. Denies any dizziness while lying on the bed. Denies any chest pain, shortness of breath.  Objective: Vital Signs  Filed Vitals:   10/28/14 1000 10/28/14 1003 10/28/14 1009 10/28/14 1200  BP: 85/64 108/46 91/52   Pulse: 70 85 75   Temp:      TempSrc:      Resp:      Height:    5\' 10"  (1.778 m)  Weight:      SpO2:        Intake/Output Summary (Last 24 hours) at 10/28/14 1305 Last data filed at 10/28/14 1221  Gross per 24 hour  Intake   2000 ml  Output      0 ml  Net   2000 ml   Filed Weights   10/27/14 2025  Weight: 68.947 kg (152 lb)    General appearance: alert, cooperative, no distress and Heart of hearing Resp: Coarse breath sounds bilaterally with no wheezing. Rales, rhonchi. Cardio: regular rate and rhythm, S1, S2 normal, no murmur, click, rub or gallop GI: soft, non-tender; bowel sounds normal; no masses,  no organomegaly Extremities: extremities normal, atraumatic, no cyanosis or edema Pulses: 2+ and symmetric Neurologic: Alert and oriented 3. No focal neurological deficits are noted.  Lab Results:  Basic Metabolic Panel:  Recent Labs Lab 10/27/14 1045 10/27/14 1055 10/27/14 1125 10/28/14 1102  NA 137 135  --  137  K 4.7 4.7  --  4.2  CL 105 105  --  105  CO2 24  --   --  25  GLUCOSE 75 70  --  101*  BUN 15 17  --  15  CREATININE 0.78 0.80  --  0.74  CALCIUM 8.9  --   --  8.8*  MG  --   --  2.1  --    Liver Function Tests:  Recent Labs Lab 10/27/14 1045  AST 20  ALT 15*  ALKPHOS 62  BILITOT 1.0  PROT 5.2*  ALBUMIN 3.5   CBC:  Recent Labs Lab 10/27/14 1045 10/27/14 1055  10/28/14 1102  WBC 5.7  --  5.3  NEUTROABS 4.3  --   --   HGB 12.8* 13.9 12.7*  HCT 38.3* 41.0 38.3*  MCV 89.5  --  89.9  PLT 189  --  203   Cardiac Enzymes:  Recent Labs Lab 10/27/14 2047  TROPONINI <0.03   CBG:  Recent Labs Lab 10/27/14 2023  GLUCAP 135*    Studies/Results: Dg Chest 2 View  10/28/2014   CLINICAL DATA:  Patient with cough for 2 days. History of bronchitis.  EXAM: CHEST  2 VIEW  COMPARISON:  Chest radiograph 10/27/2014  FINDINGS: Monitoring leads overlie the patient. Stable cardiac and mediastinal contours. Status post mitral annuloplasty. Pulmonary arterial enlargement. Minimal heterogeneous opacities right lung  base. No pleural effusion or pneumothorax. Lower thoracic spine degenerative changes with mild anterior height loss. Posttraumatic deformity to the sternum, chronic.  IMPRESSION: Heterogeneous opacities right lung base favored to represent atelectasis. Infection not excluded.  Enlarged pulmonary arteries.   Electronically Signed   By: Lovey Newcomer M.D.   On: 10/28/2014 11:08   Dg Chest Portable 1 View  10/27/2014   CLINICAL DATA:  Shortness of breath, hypotension  EXAM: PORTABLE CHEST - 1 VIEW  COMPARISON:  6/23/ 15  FINDINGS: Cardiomediastinal silhouette is stable. No acute infiltrate or pleural effusion. No pulmonary edema. Mild basilar atelectasis.  IMPRESSION: No active disease.   Electronically Signed   By: Lahoma Crocker M.D.   On: 10/27/2014 11:05    Medications:  Scheduled: . aspirin EC  81 mg Oral Daily  . atorvastatin  10 mg Oral q1800  . [START ON 10/29/2014] cosyntropin  0.25 mg Intravenous Once  . erythromycin  1 application Right Eye BID  . feeding supplement (ENSURE)  1 Container Oral TID BM  . feeding supplement (RESOURCE BREEZE)  1 Container Oral TID BM  . levothyroxine  125 mcg Oral QAC breakfast  . mirtazapine  15 mg Oral QHS  . sodium chloride  3 mL Intravenous Q12H   Continuous: . sodium chloride     QQI:[id number] **OR**  acetaminophen, hydroxypropyl methylcellulose / hypromellose, ondansetron **OR** ondansetron (ZOFRAN) IV, polyethylene glycol  Assessment/Plan:  Principal Problem:   Hypotension Active Problems:   Hypothyroidism   GERD   MGUS (monoclonal gammopathy of unknown significance)   Diaphragmatic paralysis   DNR (do not resuscitate) discussion   Dysphagia   Acute bronchitis   SCCA (squamous cell carcinoma) of skin    Orthostatic hypotension Patient was hypotensive when he presented to the department. He was initially placed on epinephrine drip by EMS. Patient received 1 L of fluid in the emergency department. Epinephrine was subsequently discontinued. Blood pressures have been stable. Orthostatics checked this morning show a drop in systolic blood pressure from 93 while lying down, 70 with sitting and 85 with standing. After 5 minutes the blood pressure stabilized. Heart rate also increased from 66 to 85. Patient did get lightheaded as well. He will be given more fluids. He is not on any antihypertensives regimen. He was on metoprolol at home, which has been held. Some of this could be autonomic. His cortisol level is noted to be low this morning. We will do a ACTH stimulation test. Lactic acid is normal. Recent acute bronchitis could've caused dehydration.  Recent bronchitis Continues to have a cough. Chest x-ray did not show any infiltrate. Repeated this morning and it shows only atelectasis. He recently completed about 12 days of Levaquin. No clear indication to continue Antbiotics at this time.   Dyspnea on exertion Possibly due to his bronchitis. Echocardiogram report as above. No systolic dysfunction noted. There is no evidence of fluid overload. PT and OT to evaluate. Check room air saturations.  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 His last treatment was in February. Is followed by Dr. Isidore Moos of radiation oncology and by Dr. [name] ENT  surgeon at [facility].  Hypothyroidism Continue Synthroid. TSH is normal  Dysphagia Dysphagia 2 diet with ensure and resource breeze.   DVT Prophylaxis: SCDs    Code Status: DO NOT RESUSCITATE  Family Communication: Discussed with his daughter Disposition Plans:. He is from independent living facility. Await PT and OT evaluation.  Follow-up  Appointment?: Needs close follow-up with PCP     [facility] Pager [phone] 10/28/2014, 1:05 PM  If 7PM-7AM, please contact night-coverage at [email or web address], password [facility]

## 2014-10-28 NOTE — Evaluation (Signed)
Occupational Therapy Evaluation Patient Details Name: Adrian West MRN: [id number] DOB: 1928/02/18 Today's Date: 10/28/2014    History of Present Illness The patient is an 81 year old male with history of squamous cell carcinoma for which he has had surgery, radiation, and is followed closely by ENT. 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  Pt requiring supervision for OOB mobility for safety due to hypotension. Educated pt at length in safety and fall prevention with pt considering use of a shower seat and having supervision during showering initially upon return home.  Pt politely declining further OT, but expression appreciation of education and recommendations.  OT is signing off.    Follow Up Recommendations  No OT follow up    Equipment Recommendations  Other (comment) (pt declining shower equipment)    Recommendations for Other Services       Precautions / Restrictions Precautions Precautions: Fall      Mobility Bed Mobility Overal bed mobility: Independent                Transfers Overall transfer level: Needs assistance Equipment used: None   Sit to Stand: Supervision         General transfer comment: supervision for IV pole and due to hypotension    Balance                                            ADL                                         General ADL Comments: Pt performs seated ADL with set up and standing with supervision for safety due to hypotension. Educated pt on benefits of a shower seat for safety and keeping  a seat nearby with prolonged standing activities such as during grooming.       Vision     Perception     Praxis      Pertinent Vitals/Pain Pain Assessment: No/denies pain     Hand Dominance Right   Extremity/Trunk Assessment Upper Extremity Assessment Upper Extremity Assessment: Overall WFL for tasks assessed   Lower Extremity Assessment Lower Extremity Assessment: Defer to PT evaluation   Cervical / Trunk Assessment Cervical / Trunk Assessment: Normal   Communication Communication Communication: HOH (hearing aid in L ear, s/p R ear resection from cancer)   Cognition Arousal/Alertness: Awake/alert Behavior During Therapy: WFL for tasks assessed/performed Overall Cognitive Status: Within Functional Limits for tasks assessed                     General Comments       Exercises       Shoulder Instructions      Home Living Family/patient expects to be discharged to:: Private residence Living Arrangements: Alone Available Help at Discharge: Family;Friend(s);Available PRN/intermittently Type of Home: Apartment (at [facility]) Home Access: Level entry     Home Layout: One level  Bathroom Shower/Tub: Occupational psychologist: Standard     Home Equipment: Cane - single point;Grab bars - tub/shower   Additional Comments: Drives to dining room to pick up 1 meal a day, pt with poor taste sensation since radiation and prefers to use ensure.      Prior Functioning/Environment Level of Independence: Independent with assistive device(s)        Comments: reports he uses a cane 50% of the time; still drives, enjoys golf    OT Diagnosis:     OT Problem List:     OT Treatment/Interventions:      OT Goals(Current goals can be found in the care plan section) Acute Rehab OT Goals Patient Stated Goal: wants to know more about his heart, and his BP issues  OT Frequency:     Barriers to D/C:            Co-evaluation               End of Session    Activity Tolerance: Patient tolerated treatment well Patient left: in bed;with call bell/phone within reach;with bed alarm set   Time: 1350-1410 OT Time Calculation (min): 20 min Charges:  OT General Charges $OT Visit: 1 Procedure OT Evaluation $Initial OT Evaluation Tier I: 1 Procedure G-Codes: OT G-codes **NOT FOR INPATIENT CLASS** Functional Assessment Tool Used: clinical judgement Functional Limitation: Self care Self Care Current Status (M3559): At least 1 percent but less than 20 percent impaired, limited or restricted Self Care Goal Status (R4163): At least 1 percent but less than 20 percent impaired, limited or restricted Self Care Discharge Status [phone]): At least 1 percent but less than 20 percent impaired, limited or restricted  Malka So 10/28/2014, 3:02 PM  [phone]

## 2014-10-28 NOTE — Progress Notes (Signed)
  Echocardiogram 2D Echocardiogram has been performed.  Adrian West 10/28/2014, 9:26 AM

## 2014-10-28 NOTE — Progress Notes (Signed)
Initial Nutrition Assessment  DOCUMENTATION CODES:  Severe malnutrition in context of chronic illness   Pt meets criteria for SEVERE MALNUTRITION in the context of chronic illness as evidenced by 17.8% weight loss in 4 months and severe muscle mass loss.  INTERVENTION:  Resource Breeze po 5 times daily, each supplement provides 250 kcal and 9 grams of protein.  Ensure Enlive po TID, each supplement provides 350 kcal and 20 grams of protein.  Encourage adequate PO intake.    NUTRITION DIAGNOSIS:  Malnutrition related to cancer and cancer related treatments as evidenced by percent weight loss, severe depletion of muscle mass.  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 He has received radiation to his throat and has recently decided he does not want to have a PEG placed because his swallowing is getting better and his appetite is returning.  Meal completion has been 0%. Pt reports a lost of appetite which has been ongoing over the past 3-4 months. Pt currently has Ensure and Resource Breeze ordered and has been consuming them. Pt reports he usually consumes Ensure TID and Resource Breeze 5 times daily per oncologist RD recommendations. Family at bedside and reports he has been on a liquid diet over the past ~100 days. Of note, pt is being followed by the oncologist dietitian from [place] center. Per Epic weight records, pt with a 17.8% weight loss in 4 months. Pt reports usual body weight of ~190 lbs. RD to modify supplement orders to aid in caloric and protein needs. Pt was  encouraged to eat his food at meals and to consume his supplements.   Labs and medications reviewed.  Height:  Ht Readings from Last 1 Encounters:  10/28/14 5\' 10"  (1.778 m)    Weight:  Wt Readings from Last 1 Encounters:  10/27/14 152 lb (68.947 kg)    Ideal Body Weight:  67 kg  Wt Readings from Last 10 Encounters:  10/27/14 152 lb (68.947 kg)  10/15/14 155 lb 12.8 oz (70.67 kg)  10/10/14 160 lb 4.8 oz (72.712 kg)  09/15/14 160 lb (72.576 kg)  08/29/14 164 lb (74.39 kg)  08/28/14 164 lb (74.39 kg)  08/27/14 164 lb (74.39 kg)  08/15/14 164 lb 4.8 oz (74.526 kg)  08/12/14 166 lb 5 oz (75.439 kg)  08/05/14 164 lb 1.6 oz (74.435 kg)    BMI:  Body mass index is 21.81 kg/(m^2).  Estimated Nutritional Needs:  Kcal:  2050-2250  Protein:  90-105 grams  Fluid:  2 - 2.2 L/day  Skin:  Reviewed, no issues  Diet Order:  DIET DYS 2 Room service appropriate?: Yes; Fluid consistency:: Thin  EDUCATION NEEDS:  No education needs identified at this time   Intake/Output Summary (Last 24 hours) at 10/28/14 1513 Last data filed at 10/28/14 1314  Gross per 24 hour  Intake   1240 ml  Output      0 ml  Net   1240 ml    Last BM:  5/22  Kallie Locks, MS, RD, LDN Pager # [phone] After  hours/ weekend pager # [phone]

## 2014-10-28 NOTE — Progress Notes (Addendum)
Physical Therapy Treatment Note  Clinical Impression: Serial BPs during session as follows:    10/28/14 0945 10/28/14 0954 10/28/14 1000  Vital Signs  Pulse Rate 66 72 70  BP 93/71 mmHg (!) 70/53 mmHg (!) 85/64 mmHg  Patient Position (if appropriate) Lying Sitting Standing     10/28/14 1003 10/28/14 1009  Vital Signs  Pulse Rate 85 75  BP (!) 108/46 mmHg (!) 91/52 mmHg  Patient Position (if appropriate) Standing (after 3 minutes of standing) Sitting (after approx 1 minute)    Pt reported a moderate amount of dizziness with activity; did not report feeling like he had to sit to keep from passing out; Session conducted on Room Air, and no dyspnea noted; Will monitor O2 sats with amb on Room Air next session;   Full PT eval note to follow;  Thanks,  Roney Marion, [place]  Acute Rehabilitation Services Pager [phone] Office [phone]

## 2014-10-28 NOTE — Clinical Social Work Note (Signed)
CSW Consult Acknowledged:   CSW received a consult for pt's being admitted from a facility. CSW reviewed chart and spoke with the pt. The pt is from [facility]. At this time there are no identified need for the CSW. CSW will sign off.      Vian, MSW, [place]

## 2014-10-28 NOTE — Evaluation (Signed)
Physical Therapy Evaluation Patient Details Name: Adrian West MRN: [id number] DOB: 23-Nov-1927 Today's Date: 10/28/2014   History of Present Illness  The patient is an 81 year old male with history of squamous cell carcinoma for which he has had surgery, radiation, and is followed closely by ENT. 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 Pt currently with functional limitations due to the deficits listed below (see PT Problem List).  Pt will benefit from skilled PT to increase their independence and safety with mobility to allow discharge to the venue listed below.       Follow Up Recommendations Home health PT;Outpatient PT (If he is indeed not homebound, he will benefit from Outpatient PT for gait and balance, and continued at least intermittent monitoring of BP response to activity)    Equipment Recommendations  None recommended by PT (possibly 3in1; will defer to OT)    Recommendations for Other Services OT consult     Precautions / Restrictions Precautions Precautions: Fall      Mobility  Bed Mobility Overal bed mobility: Independent                Transfers Overall transfer level: Needs assistance Equipment used: None Transfers: Sit to/from Stand Sit to Stand: Supervision         General transfer comment: Overall stands well; supervision for safety given his significant hypotension; Cues to self-monitor for activity tolerance  Ambulation/Gait Ambulation/Gait assistance: Supervision Ambulation Distance (Feet): 30 Feet Assistive device: 1 person hand held assist;None (occasionally reaching out for UE support) Gait  Pattern/deviations: Decreased stride length     General Gait Details: Cues to self-monitor for activity tolerance; we did cut amb short due to some dizziness in this setting of hypotension  Stairs            Wheelchair Mobility    Modified Rankin (Stroke Patients Only)       Balance                                             Pertinent Vitals/Pain Pain Assessment: No/denies pain    Home Living Family/patient expects to be discharged to:: Other (Comment) ([facility] apartment) Living Arrangements: Alone Available Help at Discharge: Family;Friend(s);Available PRN/intermittently Type of Home: Apartment Home Access: Level entry     Home Layout: One level Home Equipment: Cane - single point Additional Comments: Goes to dining room to eat meals    Prior Function Level of Independence: Independent with assistive device(s)         Comments: reports he uses a cane 50% of the time; still drives, enjoys golf     Hand Dominance        Extremity/Trunk Assessment   Upper Extremity Assessment: Defer to OT evaluation           Lower Extremity Assessment: Overall WFL for tasks assessed      Cervical / Trunk Assessment: Normal  Communication   Communication: HOH (R ear surgery; L ear with hearing aide)  Cognition Arousal/Alertness: Awake/alert Behavior During Therapy: WFL for tasks assessed/performed Overall Cognitive Status:  Within Functional Limits for tasks assessed                      General Comments General comments (skin integrity, edema, etc.): See other PT note of this date for serial BPs during session    Exercises        Assessment/Plan    PT Assessment Patient needs continued PT services  PT Diagnosis Difficulty walking   PT Problem List Decreased activity tolerance;Decreased balance;Decreased mobility;Cardiopulmonary status limiting activity  PT Treatment Interventions DME instruction;Gait  training;Stair training;Functional mobility training;Therapeutic activities;Therapeutic exercise;Balance training;Patient/family education   PT Goals (Current goals can be found in the Care Plan section) Acute Rehab PT Goals Patient Stated Goal: wants to know more about his heart, and his BP issues PT Goal Formulation: With patient Time For Goal Achievement: 11/11/14 Potential to Achieve Goals: Good    Frequency Min 3X/week   Barriers to discharge Decreased caregiver support must be independent; is by himself in his apartment at [facility] (I believe his wife is in a higher level of care at this time)    Co-evaluation               End of Session Equipment Utilized During Treatment: Other (comment) ([place]) Activity Tolerance: Patient tolerated treatment well;Other (comment) (though limited amb distance due to moderate dizziness) Patient left: in chair;with call bell/phone within reach Nurse Communication: Mobility status;Other (comment) (and hypotension)    Functional Assessment Tool Used: Clinical Judgement Functional Limitation: Mobility: Walking and moving around Mobility: Walking and Moving Around Current Status [phone]): At least 1 percent but less than 20 percent impaired, limited or restricted Mobility: Walking and Moving Around Goal Status [phone]): 0 percent impaired, limited or restricted    Time: 0315-9458 PT Time Calculation (min) (ACUTE ONLY): 26 min   Charges:   PT Evaluation $Initial PT Evaluation Tier I: 1 Procedure PT Treatments $Therapeutic Activity: 8-22 mins   PT G Codes:   PT G-Codes **NOT FOR INPATIENT CLASS** Functional Assessment Tool Used: Clinical Judgement Functional Limitation: Mobility: Walking and moving around Mobility: Walking and Moving Around Current Status (P9292): At least 1 percent but less than 20 percent impaired, limited or restricted Mobility: Walking and Moving Around Goal Status [phone]): 0 percent impaired, limited or  restricted    Roney Marion [facility] 10/28/2014, 11:55 AM  Roney Marion, [place] Pager [phone] Office [phone]

## 2014-10-29 DIAGNOSIS — I951 Orthostatic hypotension: Secondary | ICD-10-CM

## 2014-10-29 DIAGNOSIS — E43 Unspecified severe protein-calorie malnutrition: Secondary | ICD-10-CM | POA: Diagnosis not present

## 2014-10-29 DIAGNOSIS — I9589 Other hypotension: Secondary | ICD-10-CM | POA: Diagnosis not present

## 2014-10-29 DIAGNOSIS — J986 Disorders of diaphragm: Secondary | ICD-10-CM

## 2014-10-29 LAB — CBC
HEMATOCRIT: 35.6 % — AB (ref 39.0–52.0)
HEMOGLOBIN: 11.7 g/dL — AB (ref 13.0–17.0)
MCH: 29.5 pg (ref 26.0–34.0)
MCHC: 32.9 g/dL (ref 30.0–36.0)
MCV: 89.7 fL (ref 78.0–100.0)
Platelets: 175 10*3/uL (ref 150–400)
RBC: 3.97 MIL/uL — ABNORMAL LOW (ref 4.22–5.81)
RDW: 15 % (ref 11.5–15.5)
WBC: 4.3 10*3/uL (ref 4.0–10.5)

## 2014-10-29 LAB — BASIC METABOLIC PANEL
Anion gap: 7 (ref 5–15)
BUN: 15 mg/dL (ref 6–20)
CALCIUM: 8.8 mg/dL — AB (ref 8.9–10.3)
CHLORIDE: 106 mmol/L (ref 101–111)
CO2: 24 mmol/L (ref 22–32)
CREATININE: 0.67 mg/dL (ref 0.61–1.24)
GFR calc Af Amer: >60 mL/min (ref >60)
GFR calc non Af Amer: >60 mL/min (ref >60)
GLUCOSE: 91 mg/dL (ref 65–99)
POTASSIUM: 4 mmol/L (ref 3.5–5.1)
Sodium: 137 mmol/L (ref 135–145)

## 2014-10-29 LAB — ACTH STIMULATION, 3 TIME POINTS
CORTISOL BASE: 7.4 ug/dL
Cortisol, 30 Min: 19 ug/dL
Cortisol, 60 Min: 23.2 ug/dL

## 2014-10-29 MED ORDER — UNABLE TO FIND: Status: DC

## 2014-10-29 MED ORDER — ENSURE ENLIVE PO LIQD: 237.0000 mL | Freq: Three times a day (TID) | ORAL | Status: DC

## 2014-10-29 MED ORDER — BOOST / RESOURCE BREEZE PO LIQD: 1.0000 | Freq: Every day | ORAL | Status: DC

## 2014-10-29 NOTE — Progress Notes (Signed)
Adrian West to be D/C'd [facility]  per MD order.  Discussed prescriptions and follow up appointments with the patient. Prescriptions given to patient, medication list explained in detail. Pt verbalized understanding.    Medication List    STOP taking these medications        metoprolol succinate 25 MG 24 hr tablet  Commonly known as:  TOPROL-XL     potassium chloride 10 MEQ tablet  Commonly known as:  K-DUR     senna 8.6 MG Tabs tablet  Commonly known as:  SENOKOT     sodium fluoride 1.1 % Gel dental gel  Commonly known as:  FLUORISHIELD      TAKE these medications        aspirin EC 81 MG tablet  Take 81 mg by mouth daily.     atorvastatin 10 MG tablet  Commonly known as:  LIPITOR  TAKE ONE TABLET EACH DAY     erythromycin ophthalmic ointment  1 application 2 (two) times daily. Place a thin strip into the right eye twice daily     feeding supplement (ENSURE ENLIVE) Liqd  Take 237 mLs by mouth 3 (three) times daily between meals.     feeding supplement (RESOURCE BREEZE) Liqd  Take 1 Container by mouth 5 (five) times daily.     GENTEAL MILD OP  Apply 1 application to eye as needed (both eyes).     levothyroxine 125 MCG tablet  Commonly known as:  SYNTHROID, LEVOTHROID  Take 1 tablet (125 mcg total) by mouth daily.     loratadine 10 MG tablet  Commonly known as:  CLARITIN  Take 10 mg by mouth daily as needed (congestion).     mirtazapine 15 MG tablet  Commonly known as:  REMERON  Take 1 tablet (15 mg total) by mouth at bedtime.     MUCINEX PO  Take 1 tablet by mouth daily as needed (congestion).     UNABLE TO FIND  - Physical therapy and occupational therapy to evaluate and treat.  - diagonosis--deconditioning        Filed Vitals:   10/29/14 1030  BP:   Pulse: 69  Temp:   Resp:     Skin clean, dry and intact without evidence of skin break down, no evidence of skin tears noted. IV catheter discontinued intact. Site without signs and  symptoms of complications. Dressing and pressure applied. Pt denies pain at this time. No complaints noted. Pt denied offer to take medication before leaving.   An After Visit Summary was printed and given to the patient. Patient escorted via [place], and D/C home via private auto.  Marijean Heath C 10/29/2014 2:56 PM

## 2014-10-29 NOTE — Care Management Note (Signed)
Case Management Note  Patient Details  Name: Adrian West MRN: [id number] Date of Birth: September 30, 1927  Subjective/Objective:            CM following for progression and d/c planning.         Action/Plan: 10/29/2014 Met with pt and daughter, plan is to return to [place], pt and daughter currently determining level of care needed at this time.  Pt anxious to d/c and upset about being NPO.   Expected Discharge Date:       10/29/2014          Expected Discharge Plan:  [place]  In-House Referral:  Clinical Social Work  Discharge planning Services  CM Consult  Post Acute Care Choice:    Choice offered to:  NA  DME Arranged:   HHPT DME Agency:   HHPT provider to be arranged by [name] , where pt is an independent living resident. Pt orders for HHPT, PT eval notes and d/c summary and faxed to [facility] @ [phone] per instructions from [facility]. Order also provided to pt and daughter to take with d/c instructions.   HH Arranged:    [place] Agency:     Status of Service:    Complete. Medicare Important Message Given:  No Date Medicare IM Given:    Medicare IM give by:    Date Additional Medicare IM Given:    Additional Medicare Important Message give by:     If discussed at [place] of Stay Meetings, dates discussed:    Additional Comments:  [name], RN 10/29/2014, 10:06 AM

## 2014-10-29 NOTE — Discharge Summary (Signed)
Physician Discharge Summary  Adrian West Mckelvin YFV:[id number] DOB: 03-10-1928 DOA: 10/27/2014  PCP: Olga Millers, MD  Admit date: 10/27/2014 Discharge date: 10/29/2014  Recommendations for Outpatient Follow-up:  1. Pt will need to follow up with PCP in 2 weeks post discharge 2. Please obtain BMP on 11/03/14 Discharge Diagnoses:  Orthostatic hypotension Patient was hypotensive when he presented to the department. He was initially placed on epinephrine drip by EMS. Patient received 1 L of fluid in the emergency department. Epinephrine was subsequently discontinued. Blood pressures have been stable. Orthostatics checked this morning show a drop in systolic blood pressure from 93 while lying down, 70 with sitting and 85 with standing. After 5 minutes the blood pressure stabilized. Heart rate also increased from 66 to 85.Marland Kitchen  He was on metoprolol at home, which has been held. Some of this could be autonomic. His cortisol level is noted to be low this morning. We will do a ACTH stimulation test. Lactic acid is normal. Recent acute bronchitis could've caused dehydration. -Metoprolol succinate was discontinued and will not be restarted -His hypotension was likely multifactorial including volume depletion, medications, and dysautonomia from his underlying medical conditions -Cortrosyn stimulation test negative -The patient was started on intravenous fluids with improvement of his blood pressure -Telemetry was reviewed without any concerning dysrhythmia -Patient had occasional PVCs -Repeat orthostatic vital signs obtained 10/29/2014--negative -The patient symptomatically improved without any further dizziness or vertigo -Echocardiogram showed good functioning of prosthetic mitral valve, EF 16-38%, grade 1 diastolic dysfunction, no wall motion abnormality -summary of clinical findings and summary were discussed with the patient's daughter -The patient's potassium supplementation was discontinued as his  initial potassium was at the upper limit of normal. Please repeat BMP in 1 week Recent bronchitis Continues to have a cough. Chest x-ray did not show any infiltrate. Repeated this morning and it shows only atelectasis. He recently completed about 12 days of Levaquin. No clear indication to continue Antbiotics at this time.  -Patient will remain off of antibodies at this time -The patient remained afebrile and hemodynamically stable without any leukocytosis Dyspnea on exertion Possibly due to his bronchitis. Echocardiogram report as above. No systolic dysfunction noted. There is no evidence of fluid overload. PT and OT to evaluate.  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 His last treatment was in February. Is followed by Dr. Isidore Moos of radiation oncology and by Dr. [name] ENT surgeon at [facility].  Hypothyroidism Continue Synthroid. TSH acceptable -Recheck TSH in 4-6 weeks  Dysphagia Dysphagia 2 diet with ensure and resource breeze. 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 Presented from his independent living facility with dizziness and was noted to have a blood pressure in the 80s and then subsequently the 60s. He was admitted to the hospital for further management. Orthostatic vital signs were  positive. The patient was started on intravenous fluids. His metoprolol  subsequently was discontinued. The patient was placed on telemetry. There were no concerning dysrhythmias on telemetry. Echocardiogram was unremarkable. The patient symptomatically improved. Repeat orthostatic vital signs were negative. The patient will be discharged in stable condition. Outpatient physical therapy will be set up at his independent living facility.   Discharge Exam: Filed Vitals:   10/29/14 1030  BP:   Pulse: 69  Temp:   Resp:    Filed Vitals:   10/28/14 2059 10/29/14 0453 10/29/14 0824 10/29/14 1030  BP: 111/51 123/58 113/61   Pulse: 62 58 58 69  Temp: 98 F (36.7 C) 98 F (36.7 C) 97.9 F (36.6 C)   TempSrc: Oral Oral Oral   Resp: 18 14 18    Height:      Weight: 72.122 kg (159 lb)     SpO2: 98% 99% 96%    General: A&O x 3, NAD, pleasant, cooperative Cardiovascular: RRR, no rub, no gallop, no S3 Respiratory: CTAB, no wheeze, no rhonchi Abdomen:soft, nontender, nondistended, positive bowel sounds Extremities: No edema, No lymphangitis, no petechiae  Discharge Instructions      Discharge Instructions    Diet - low sodium heart healthy    Complete by:  As directed      Increase activity slowly    Complete by:  As directed             Medication List    STOP taking these medications        metoprolol succinate 25 MG 24 hr tablet  Commonly known as:  TOPROL-XL     potassium chloride 10 MEQ tablet  Commonly known as:  K-DUR     senna 8.6 MG Tabs tablet  Commonly known as:  SENOKOT     sodium fluoride 1.1 % Gel dental gel  Commonly known as:  FLUORISHIELD      TAKE these medications        aspirin EC 81 MG tablet  Take 81 mg by mouth daily.     atorvastatin 10 MG tablet  Commonly known as:  LIPITOR  TAKE ONE TABLET EACH DAY     erythromycin ophthalmic ointment  1 application 2 (two) times daily. Place a thin strip into the right eye twice daily     feeding  supplement (ENSURE ENLIVE) Liqd  Take 237 mLs by mouth 3 (three) times daily between meals.     feeding supplement (RESOURCE BREEZE) Liqd  Take 1 Container by mouth 5 (five) times daily.     GENTEAL MILD OP  Apply 1 application to eye as needed (both eyes).     levothyroxine 125 MCG tablet  Commonly known as:  SYNTHROID, LEVOTHROID  Take 1 tablet (125 mcg total) by mouth daily.     loratadine 10 MG tablet  Commonly known as:  CLARITIN  Take 10 mg by mouth daily as needed (congestion).     mirtazapine 15 MG tablet  Commonly known as:  REMERON  Take 1 tablet (15 mg total) by mouth at bedtime.     MUCINEX PO  Take 1 tablet by mouth daily as needed (congestion).         The results of significant diagnostics from this hospitalization (including imaging, microbiology, ancillary and laboratory) are listed below for reference.    Significant Diagnostic Studies: Dg Chest 2 View  10/28/2014   CLINICAL DATA:  Patient with cough for 2 days. History of bronchitis.  EXAM: CHEST  2 VIEW  COMPARISON:  Chest radiograph 10/27/2014  FINDINGS: Monitoring leads  overlie the patient. Stable cardiac and mediastinal contours. Status post mitral annuloplasty. Pulmonary arterial enlargement. Minimal heterogeneous opacities right lung base. No pleural effusion or pneumothorax. Lower thoracic spine degenerative changes with mild anterior height loss. Posttraumatic deformity to the sternum, chronic.  IMPRESSION: Heterogeneous opacities right lung base favored to represent atelectasis. Infection not excluded.  Enlarged pulmonary arteries.   Electronically Signed   By: Lovey Newcomer M.D.   On: 10/28/2014 11:08   Dg Chest Portable 1 View  10/27/2014   CLINICAL DATA:  Shortness of breath, hypotension  EXAM: PORTABLE CHEST - 1 VIEW  COMPARISON:  6/23/ 15  FINDINGS: Cardiomediastinal silhouette is stable. No acute infiltrate or pleural effusion. No pulmonary edema. Mild basilar atelectasis.  IMPRESSION: No active  disease.   Electronically Signed   By: Lahoma Crocker M.D.   On: 10/27/2014 11:05     Microbiology: No results found for this or any previous visit (from the past 240 hour(s)).   Labs: Basic Metabolic Panel:  Recent Labs Lab 10/27/14 1045 10/27/14 1055 10/27/14 1125 10/28/14 1102 10/29/14 0531  NA 137 135  --  137 137  K 4.7 4.7  --  4.2 4.0  CL 105 105  --  105 106  CO2 24  --   --  25 24  GLUCOSE 75 70  --  101* 91  BUN 15 17  --  15 15  CREATININE 0.78 0.80  --  0.74 0.67  CALCIUM 8.9  --   --  8.8* 8.8*  MG  --   --  2.1  --   --    Liver Function Tests:  Recent Labs Lab 10/27/14 1045  AST 20  ALT 15*  ALKPHOS 62  BILITOT 1.0  PROT 5.2*  ALBUMIN 3.5   No results for input(s): LIPASE, AMYLASE in the last 168 hours. No results for input(s): AMMONIA in the last 168 hours. CBC:  Recent Labs Lab 10/27/14 1045 10/27/14 1055 10/28/14 1102 10/29/14 0531  WBC 5.7  --  5.3 4.3  NEUTROABS 4.3  --   --   --   HGB 12.8* 13.9 12.7* 11.7*  HCT 38.3* 41.0 38.3* 35.6*  MCV 89.5  --  89.9 89.7  PLT 189  --  203 175   Cardiac Enzymes:  Recent Labs Lab 10/27/14 2047 10/28/14 2026  TROPONINI <0.03 <0.03   BNP: Invalid input(s): POCBNP CBG:  Recent Labs Lab 10/27/14 2023  GLUCAP 135*    Time coordinating discharge:  Greater than 30 minutes  Signed:  Ryn Peine, DO [facility] Pager: [phone] 10/29/2014, 11:02 AM

## 2014-10-30 ENCOUNTER — Telehealth: Payer: Self-pay | Admitting: *Deleted

## 2014-10-30 ENCOUNTER — Telehealth: Payer: Self-pay | Admitting: Internal Medicine

## 2014-10-30 NOTE — Telephone Encounter (Signed)
Adrian West from [facility] needs an order sent there for a repeat BMP on 5/31. [facility] fax is [phone] Patient was in hospital and he has a follow up appt with you on 6/20

## 2014-10-30 NOTE — Telephone Encounter (Signed)
CALLED PATIENT TO REMIND OF IV FLUIDS AND FU WITH DR. Isidore Moos ON 10/31/14, LVM FOR A RETURN CALL

## 2014-10-30 NOTE — Telephone Encounter (Signed)
Transition Care Management Follow-up Telephone Call D/C 10/29/14  How have you been since you were released from the hospital? Spoke to wife states he is doing ok   Do you understand why you were in the hospital? YES   Do you understand the discharge instrcutions? YES  Items Reviewed:  Medications reviewed: YES  Allergies reviewed: YES  Dietary changes reviewed: YES, heart healthy & low sodium  Referrals reviewed: NO   Functional Questionnaire:   Activities of Daily Living (ADLs):   She states he are independent in the following: bathing and hygiene, feeding, continence, grooming, toileting and dressing States he require assistance with the following: ambulation and dressing   Any transportation issues/concerns?: NO   Any patient concerns? NO   Confirmed importance and date/time of follow-up visits scheduled: YES, appt made for 11/24/14   Confirmed with patient if condition begins to worsen call PCP or go to the ER.

## 2014-10-31 ENCOUNTER — Ambulatory Visit
Admission: RE | Admit: 2014-10-31 | Discharge: 2014-10-31 | Disposition: A | Discharge status: Home / Self Care | Payer: Medicare Other | Source: Ambulatory Visit | Attending: Radiation Oncology | Admitting: Radiation Oncology

## 2014-10-31 ENCOUNTER — Ambulatory Visit (HOSPITAL_BASED_OUTPATIENT_CLINIC_OR_DEPARTMENT_OTHER): Payer: Medicare Other

## 2014-10-31 VITALS — BP 115/51 | HR 57 | Temp 97.4°F | Resp 16 | Ht 70.0 in | Wt 159.1 lb

## 2014-10-31 VITALS — BP 104/78 | HR 80 | Temp 97.4°F | Resp 17

## 2014-10-31 DIAGNOSIS — C089 Malignant neoplasm of major salivary gland, unspecified: Secondary | ICD-10-CM

## 2014-10-31 DIAGNOSIS — I950 Idiopathic hypotension: Secondary | ICD-10-CM | POA: Diagnosis not present

## 2014-10-31 DIAGNOSIS — R1310 Dysphagia, unspecified: Secondary | ICD-10-CM | POA: Diagnosis not present

## 2014-10-31 DIAGNOSIS — J40 Bronchitis, not specified as acute or chronic: Secondary | ICD-10-CM | POA: Diagnosis not present

## 2014-10-31 DIAGNOSIS — C770 Secondary and unspecified malignant neoplasm of lymph nodes of head, face and neck: Secondary | ICD-10-CM

## 2014-10-31 DIAGNOSIS — R269 Unspecified abnormalities of gait and mobility: Secondary | ICD-10-CM | POA: Diagnosis not present

## 2014-10-31 MED ORDER — SODIUM CHLORIDE 0.9 % IV SOLN
Freq: Once | INTRAVENOUS | Status: AC
  Administered 2014-10-31: 13:00:00 via INTRAVENOUS
  Filled 2014-10-31: qty 1000

## 2014-10-31 NOTE — Patient Instructions (Signed)
Dehydration, Adult Dehydration is when you lose more fluids from the body than you take in. Vital organs like the kidneys, brain, and heart cannot function without a proper amount of fluids and salt. Any loss of fluids from the body can cause dehydration.  CAUSES   Vomiting.  Diarrhea.  Excessive sweating.  Excessive urine output.  Fever. SYMPTOMS  Mild dehydration  Thirst.  Dry lips.  Slightly dry mouth. Moderate dehydration  Very dry mouth.  Sunken eyes.  Skin does not bounce back quickly when lightly pinched and released.  Dark urine and decreased urine production.  Decreased tear production.  Headache. Severe dehydration  Very dry mouth.  Extreme thirst.  Rapid, weak pulse (more than 100 beats per minute at rest).  Cold hands and feet.  Not able to sweat in spite of heat and temperature.  Rapid breathing.  Blue lips.  Confusion and lethargy.  Difficulty being awakened.  Minimal urine production.  No tears. DIAGNOSIS  Your caregiver will diagnose dehydration based on your symptoms and your exam. Blood and urine tests will help confirm the diagnosis. The diagnostic evaluation should also identify the cause of dehydration. TREATMENT  Treatment of mild or moderate dehydration can often be done at home by increasing the amount of fluids that you drink. It is best to drink small amounts of fluid more often. Drinking too much at one time can make vomiting worse. Refer to the home care instructions below. Severe dehydration needs to be treated at the hospital where you will probably be given intravenous (IV) fluids that contain water and electrolytes. HOME CARE INSTRUCTIONS   Ask your caregiver about specific rehydration instructions.  Drink enough fluids to keep your urine clear or pale yellow.  Drink small amounts frequently if you have nausea and vomiting.  Eat as you normally do.  Avoid:  Foods or drinks high in sugar.  Carbonated  drinks.  Juice.  Extremely hot or cold fluids.  Drinks with caffeine.  Fatty, greasy foods.  Alcohol.  Tobacco.  Overeating.  Gelatin desserts.  Wash your hands well to avoid spreading bacteria and viruses.  Only take over-the-counter or prescription medicines for pain, discomfort, or fever as directed by your caregiver.  Ask your caregiver if you should continue all prescribed and over-the-counter medicines.  Keep all follow-up appointments with your caregiver. SEEK MEDICAL CARE IF:  You have abdominal pain and it increases or stays in one area (localizes).  You have a rash, stiff neck, or severe headache.  You are irritable, sleepy, or difficult to awaken.  You are weak, dizzy, or extremely thirsty. SEEK IMMEDIATE MEDICAL CARE IF:   You are unable to keep fluids down or you get worse despite treatment.  You have frequent episodes of vomiting or diarrhea.  You have blood or green matter (bile) in your vomit.  You have blood in your stool or your stool looks black and tarry.  You have not urinated in 6 to 8 hours, or you have only urinated a small amount of very dark urine.  You have a fever.  You faint. MAKE SURE YOU:   Understand these instructions.  Will watch your condition.  Will get help right away if you are not doing well or get worse. Document Released: 05/23/2005 Document Revised: 08/15/2011 Document Reviewed: 01/10/2011 ExitCare Patient Information 2015 [employer]. This information is not intended to replace advice given to you by your health care provider. Make sure you discuss any questions you have with your health care   provider.  

## 2014-10-31 NOTE — Progress Notes (Signed)
1650- pt returned from radiation, IVFs at 50 mL/hr. Rate increased back to 366 mL/hr, 381mL/hr + 10 % overfill to rate.

## 2014-10-31 NOTE — Telephone Encounter (Signed)
Please call in verbal order for BMP on 11/04/14.

## 2014-10-31 NOTE — Progress Notes (Signed)
Radiation Oncology         [phone] ________________________________  Name: Adrian West MRN: [id number]  Date: 10/31/2014  DOB: [birth year]  Follow-Up Visit Note  CC: Olga Millers, MD  [name], MD    ICD-9-CM ICD-10-CM   1. 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 Recently discharged after admission for orthostatic hypotension felt to be of multifactorial etiology. He stopped metoprolol.   Has f/u with cardiology and PCP  He occasionally feels pain that goes through his right ear and disappears. He is currently getting IV fluids. He denies trouble swallowing and is trying to eat half solid foods and half ensure/breeze. He is drinking 3 ensures per day and 4 breeze's per day. He eats a big bowel of cereal and coffee in the morning. He is moving his way up to eating "a plate of food." He reports his throat is feeling better. He was discharged from the hospital on Wednesday for orthostatic hypotension. This was thought to be multifactorial in cause.He is back at [facility]. His weight is stable today. He was taken off the potassium and zinc tablets. He (himself) has stopped taking the Remeron  about 2-3 days ago.  ALLERGIES:  has No Known Allergies.  Meds: Current Outpatient Prescriptions  Medication Sig Dispense Refill  . aspirin EC 81 MG tablet Take 81 mg by mouth daily.    Marland Kitchen atorvastatin (LIPITOR) 10 MG tablet TAKE ONE TABLET EACH DAY 30 tablet 1  . erythromycin ophthalmic ointment 1 application 2 (two) times daily. Place a thin strip into the right eye twice daily    . feeding supplement, ENSURE ENLIVE, (ENSURE ENLIVE) LIQD Take 237 mLs by mouth 3 (three) times daily between meals. 237 mL 12  . feeding supplement, RESOURCE BREEZE, (RESOURCE BREEZE) LIQD Take 1 Container by mouth 5 (five) times daily. 1 Container 99  . GuaiFENesin (MUCINEX PO) Take 1 tablet by mouth daily as needed (congestion).    . Hypromellose (GENTEAL MILD OP) Apply 1 application to eye as needed (both eyes).    Marland Kitchen levothyroxine (SYNTHROID, LEVOTHROID) 125 MCG tablet Take 1 tablet (125 mcg total) by mouth daily. 90 tablet 1  . loratadine (CLARITIN) 10 MG tablet Take 10 mg by mouth daily as needed (congestion).    . mirtazapine (REMERON) 15 MG tablet Take 1 tablet (15 mg total) by mouth at bedtime. 90 tablet 3  . UNABLE TO FIND Physical therapy and occupational therapy to evaluate and treat. diagonosis--deconditioning 1 Act 0   No current facility-administered medications for this encounter.    Physical Findings:  The patient  is in no acute distress. Patient is alert and oriented.     Wt Readings from Last 3 Encounters:  10/31/14 159 lb 1.6 oz (72.167 kg)  10/28/14 159 lb (72.122 kg)  10/15/14 155 lb 12.8 oz (70.67 kg)    height is 5\' 10"  (1.778 m) and weight is 159 lb 1.6 oz (72.167 kg). His oral temperature is 97.4 F (36.3 C). His blood pressure is 115/51 and his pulse is 57. His respiration is 16 and oxygen saturation is 100%. . No palpable masses in the upper and lower neck nor in the post or pre auricular. Mouth is dry, but with increased salivary function than previously. No lesions or thrush  noted in the mouth. No edema in his ankles.  Lab Findings: Lab Results  Component Value Date   WBC 4.3 10/29/2014   HGB 11.7* 10/29/2014   HCT 35.6* 10/29/2014   MCV 89.7 10/29/2014   PLT 175 10/29/2014   CMP Latest Ref Rng 10/29/2014 10/28/2014 10/27/2014  Glucose 65 - 99 mg/dL 91 101(H) 70  BUN 6 - 20 mg/dL 15 15 17   Creatinine 0.61 - 1.24 mg/dL 0.67 0.74 0.80  Sodium 135 - 145 mmol/L 137 137 135  Potassium 3.5 - 5.1 mmol/L 4.0 4.2 4.7  Chloride 101 - 111 mmol/L 106 105 105  CO2 22 - 32 mmol/L 24 25 -  Calcium 8.9 - 10.3 mg/dL 8.8(L) 8.8(L) -  Total Protein 6.5 - 8.1 g/dL - - -  Total Bilirubin 0.3 - 1.2 mg/dL - - -  Alkaline Phos 38 - 126 U/L - - -  AST 15 - 41 U/L - - -  ALT 17 - 63 U/L - - -    Lab Results  Component Value Date   TSH 4.448 10/27/2014    Radiographic Findings: Dg Chest 2 View  10/28/2014   CLINICAL DATA:  Patient with cough for 2 days. History of bronchitis.  EXAM: CHEST  2 VIEW  COMPARISON:  Chest radiograph 10/27/2014  FINDINGS: Monitoring leads overlie the patient. Stable cardiac and mediastinal contours. Status post mitral annuloplasty. Pulmonary arterial enlargement. Minimal heterogeneous opacities right lung base. No pleural effusion or pneumothorax. Lower thoracic spine degenerative changes with mild anterior height loss. Posttraumatic deformity to the sternum, chronic.  IMPRESSION: Heterogeneous opacities right lung base favored to represent atelectasis. Infection not excluded.  Enlarged pulmonary arteries.   Electronically Signed   By: Lovey Newcomer M.D.   On: 10/28/2014 11:08   Dg Chest Portable 1 View  10/27/2014   CLINICAL DATA:  Shortness of breath, hypotension  EXAM: PORTABLE CHEST - 1 VIEW  COMPARISON:  6/23/ 15  FINDINGS: Cardiomediastinal silhouette is stable. No acute infiltrate or pleural effusion. No pulmonary edema. Mild basilar atelectasis.  IMPRESSION: No active disease.   Electronically Signed   By: Lahoma Crocker M.D.   On: 10/27/2014 11:05     Impression/Plan:    1) Head and Neck Cancer Status:  NED on physical exam, side effects diminishing  2) Nutritional Status: He is drinking 3 ensures and 4 breezes a day. The pt's appetite is increasing gradually.  We discussed feeding tube again.  With improvement in weight and intake, he and I feel that it is best to delay this and see if he continues to improve/stabilize, given risks of anesthesia and surgery.  3) Risk Factors: patient knows to use good "sun hygiene"   4) Swallowing: has been seen by SLP and advised to continue with current diet as tolerated  5) Dental: Encouraged to continue regular followup with dentistry, and dental hygiene per Dr [name].  6) Thyroid function: preexisting condition of hypothyroidism   - on medication.  Lab Results  Component Value Date   TSH 4.448 10/27/2014    7) Social: Wife is in assisted living.  8) Other: PT via [facility]  PCP and cardiology f/u encouraged  (orthostatic hypotension)  Continue Remeron every other day for the next 2 weeks and then stop.  9) Continue IVF once/weekly (Fridays) for several weeks as these may him feel better.  10) He is to f/u with me in 1 month.  This document serves as a record of services personally performed by [name], MD. It was created on her behalf by Darcus Austin, a trained medical scribe. The creation of this record is based on the scribe's personal observations and the provider's statements to them. This document has been checked and approved by the attending provider.      _____________________________________   [name], MD

## 2014-10-31 NOTE — Progress Notes (Addendum)
Adrian Kitchen West here for follow up.  He denies pain.  He is currently getting IV fluids.  He denies trouble swallowing and is trying to eat half solid foods and half ensure/breeze.  He is drinking 3 ensures per day and 4 breeze's per day.  He reports his throat is feeling better.  He was discharged from the hospital on Wednesday with dehydration/hypotension.  He is back at [facility].  His weight is stable today.    BP 115/51 mmHg  Pulse 57  Temp(Src) 97.4 F (36.3 C) (Oral)  Resp 16  Ht 5\' 10"  (1.778 m)  Wt 159 lb 1.6 oz (72.167 kg)  BMI 22.83 kg/m2  SpO2 100%   Wt Readings from Last 3 Encounters:  10/31/14 159 lb 1.6 oz (72.167 kg)  10/28/14 159 lb (72.122 kg)  10/15/14 155 lb 12.8 oz (70.67 kg)

## 2014-10-31 NOTE — Telephone Encounter (Signed)
Faxed order to [facility].

## 2014-11-04 ENCOUNTER — Encounter: Payer: Self-pay | Admitting: Cardiology

## 2014-11-04 ENCOUNTER — Ambulatory Visit (INDEPENDENT_AMBULATORY_CARE_PROVIDER_SITE_OTHER): Payer: Medicare Other | Admitting: Cardiology

## 2014-11-04 VITALS — BP 107/66 | HR 70 | Ht 70.0 in | Wt 157.0 lb

## 2014-11-04 DIAGNOSIS — I493 Ventricular premature depolarization: Secondary | ICD-10-CM | POA: Diagnosis not present

## 2014-11-04 DIAGNOSIS — I951 Orthostatic hypotension: Secondary | ICD-10-CM | POA: Diagnosis not present

## 2014-11-04 DIAGNOSIS — I080 Rheumatic disorders of both mitral and aortic valves: Secondary | ICD-10-CM

## 2014-11-04 NOTE — Patient Instructions (Signed)
Medication Instructions:  No changes today.  Labwork: None today.  Testing/Procedures: None today.  Follow-Up: Your physician wants you to follow-up in: 6 months with Dr [name]. (November 2016).You will receive a reminder letter in the mail two months in advance. If you don't receive a letter, please call our office to schedule the follow-up appointment.   Any Other Special Instructions Will Be Listed Below (If Applicable).  Use knee high  graded compression stockings 20-30 mmHg--put them on when you get up in the morning and take them off at night.   Eliot Ford  Dr McLean's nurse--[phone]

## 2014-11-05 ENCOUNTER — Other Ambulatory Visit: Payer: Self-pay | Admitting: *Deleted

## 2014-11-05 DIAGNOSIS — C089 Malignant neoplasm of major salivary gland, unspecified: Secondary | ICD-10-CM

## 2014-11-05 DIAGNOSIS — R269 Unspecified abnormalities of gait and mobility: Secondary | ICD-10-CM | POA: Diagnosis not present

## 2014-11-05 DIAGNOSIS — R1310 Dysphagia, unspecified: Secondary | ICD-10-CM | POA: Diagnosis not present

## 2014-11-05 DIAGNOSIS — I950 Idiopathic hypotension: Secondary | ICD-10-CM | POA: Diagnosis not present

## 2014-11-05 DIAGNOSIS — J40 Bronchitis, not specified as acute or chronic: Secondary | ICD-10-CM | POA: Diagnosis not present
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
## 2014-11-06 ENCOUNTER — Telehealth: Payer: Self-pay | Admitting: *Deleted

## 2014-11-06 DIAGNOSIS — J40 Bronchitis, not specified as acute or chronic: Secondary | ICD-10-CM | POA: Diagnosis not present

## 2014-11-06 DIAGNOSIS — R1310 Dysphagia, unspecified: Secondary | ICD-10-CM | POA: Diagnosis not present

## 2014-11-06 DIAGNOSIS — C089 Malignant neoplasm of major salivary gland, unspecified: Secondary | ICD-10-CM | POA: Diagnosis not present

## 2014-11-06 DIAGNOSIS — R269 Unspecified abnormalities of gait and mobility: Secondary | ICD-10-CM | POA: Diagnosis not present

## 2014-11-06 DIAGNOSIS — I950 Idiopathic hypotension: Secondary | ICD-10-CM | POA: Diagnosis not present

## 2014-11-06 NOTE — Telephone Encounter (Signed)
CALLED PATIENT TO INFORM OF APPTS., LVM FOR A RETURN CALL 

## 2014-11-07 ENCOUNTER — Telehealth: Payer: Self-pay | Admitting: *Deleted

## 2014-11-07 DIAGNOSIS — I950 Idiopathic hypotension: Secondary | ICD-10-CM | POA: Diagnosis not present

## 2014-11-07 DIAGNOSIS — C089 Malignant neoplasm of major salivary gland, unspecified: Secondary | ICD-10-CM | POA: Diagnosis not present

## 2014-11-07 DIAGNOSIS — R269 Unspecified abnormalities of gait and mobility: Secondary | ICD-10-CM | POA: Diagnosis not present

## 2014-11-07 DIAGNOSIS — J40 Bronchitis, not specified as acute or chronic: Secondary | ICD-10-CM | POA: Diagnosis not present

## 2014-11-07 DIAGNOSIS — R1310 Dysphagia, unspecified: Secondary | ICD-10-CM | POA: Diagnosis not present

## 2014-11-07 NOTE — Telephone Encounter (Signed)
Oncology Nurse Navigator Documentation  Oncology Nurse Navigator Flowsheets 11/07/2014  Navigator Encounter Type Telephone  Returned patient's morning VM asking about upcoming IVF appts.  I confirmed he has IVF scheduled for Monday at 1345 as well as Monday 6/13 and 6/20.  He verbalized understanding, thanked me for my call.  Time Spent with Patient [place], RN, BSN, [place] at [place] [phone]

## 2014-11-10 ENCOUNTER — Other Ambulatory Visit: Payer: Self-pay | Admitting: *Deleted

## 2014-11-10 ENCOUNTER — Ambulatory Visit (HOSPITAL_BASED_OUTPATIENT_CLINIC_OR_DEPARTMENT_OTHER): Payer: Medicare Other

## 2014-11-10 VITALS — BP 112/56 | HR 55 | Temp 97.9°F | Resp 18

## 2014-11-10 DIAGNOSIS — C089 Malignant neoplasm of major salivary gland, unspecified: Secondary | ICD-10-CM | POA: Diagnosis not present

## 2014-11-10 DIAGNOSIS — J40 Bronchitis, not specified as acute or chronic: Secondary | ICD-10-CM | POA: Diagnosis not present

## 2014-11-10 DIAGNOSIS — R1310 Dysphagia, unspecified: Secondary | ICD-10-CM | POA: Diagnosis not present

## 2014-11-10 DIAGNOSIS — C770 Secondary and unspecified malignant neoplasm of lymph nodes of head, face and neck: Secondary | ICD-10-CM

## 2014-11-10 DIAGNOSIS — R269 Unspecified abnormalities of gait and mobility: Secondary | ICD-10-CM | POA: Diagnosis not present

## 2014-11-10 DIAGNOSIS — I950 Idiopathic hypotension: Secondary | ICD-10-CM | POA: Diagnosis not present

## 2014-11-10 MED ORDER — SODIUM CHLORIDE 0.9 % IV SOLN
INTRAVENOUS | Status: DC
  Administered 2014-11-10: 15:00:00 via INTRAVENOUS

## 2014-11-10 NOTE — Progress Notes (Signed)
Verified with Gayleen Orem, RN, patient is to receive Normal Saline infusion today without potassium added.  States Dr. Isidore Moos would like patient to receive fluids on Fridays before the weekend and [name] will work on getting appointments adjusted. Patient verbalized understanding.

## 2014-11-10 NOTE — Patient Instructions (Signed)
Dehydration, Adult Dehydration is when you lose more fluids from the body than you take in. Vital organs like the kidneys, brain, and heart cannot function without a proper amount of fluids and salt. Any loss of fluids from the body can cause dehydration.  CAUSES   Vomiting.  Diarrhea.  Excessive sweating.  Excessive urine output.  Fever. SYMPTOMS  Mild dehydration  Thirst.  Dry lips.  Slightly dry mouth. Moderate dehydration  Very dry mouth.  Sunken eyes.  Skin does not bounce back quickly when lightly pinched and released.  Dark urine and decreased urine production.  Decreased tear production.  Headache. Severe dehydration  Very dry mouth.  Extreme thirst.  Rapid, weak pulse (more than 100 beats per minute at rest).  Cold hands and feet.  Not able to sweat in spite of heat and temperature.  Rapid breathing.  Blue lips.  Confusion and lethargy.  Difficulty being awakened.  Minimal urine production.  No tears. DIAGNOSIS  Your caregiver will diagnose dehydration based on your symptoms and your exam. Blood and urine tests will help confirm the diagnosis. The diagnostic evaluation should also identify the cause of dehydration. TREATMENT  Treatment of mild or moderate dehydration can often be done at home by increasing the amount of fluids that you drink. It is best to drink small amounts of fluid more often. Drinking too much at one time can make vomiting worse. Refer to the home care instructions below. Severe dehydration needs to be treated at the hospital where you will probably be given intravenous (IV) fluids that contain water and electrolytes. HOME CARE INSTRUCTIONS   Ask your caregiver about specific rehydration instructions.  Drink enough fluids to keep your urine clear or pale yellow.  Drink small amounts frequently if you have nausea and vomiting.  Eat as you normally do.  Avoid:  Foods or drinks high in sugar.  Carbonated  drinks.  Juice.  Extremely hot or cold fluids.  Drinks with caffeine.  Fatty, greasy foods.  Alcohol.  Tobacco.  Overeating.  Gelatin desserts.  Wash your hands well to avoid spreading bacteria and viruses.  Only take over-the-counter or prescription medicines for pain, discomfort, or fever as directed by your caregiver.  Ask your caregiver if you should continue all prescribed and over-the-counter medicines.  Keep all follow-up appointments with your caregiver. SEEK MEDICAL CARE IF:  You have abdominal pain and it increases or stays in one area (localizes).  You have a rash, stiff neck, or severe headache.  You are irritable, sleepy, or difficult to awaken.  You are weak, dizzy, or extremely thirsty. SEEK IMMEDIATE MEDICAL CARE IF:   You are unable to keep fluids down or you get worse despite treatment.  You have frequent episodes of vomiting or diarrhea.  You have blood or green matter (bile) in your vomit.  You have blood in your stool or your stool looks black and tarry.  You have not urinated in 6 to 8 hours, or you have only urinated a small amount of very dark urine.  You have a fever.  You faint. MAKE SURE YOU:   Understand these instructions.  Will watch your condition.  Will get help right away if you are not doing well or get worse. Document Released: 05/23/2005 Document Revised: 08/15/2011 Document Reviewed: 01/10/2011 ExitCare Patient Information 2015 [employer]. This information is not intended to replace advice given to you by your health care provider. Make sure you discuss any questions you have with your health care   provider.  

## 2014-11-11 ENCOUNTER — Telehealth: Payer: Self-pay | Admitting: *Deleted

## 2014-11-11 NOTE — Telephone Encounter (Signed)
IVF appts were entered wrong, per Burman Nieves I have cancelled appts and rescheduled appts. Malachy Mood from radiation will contact patient

## 2014-11-12 ENCOUNTER — Telehealth: Payer: Self-pay | Admitting: *Deleted

## 2014-11-12 NOTE — Telephone Encounter (Addendum)
Oncology Nurse Navigator Documentation  Oncology Nurse Navigator Flowsheets 11/12/2014  Navigator Encounter Type Telephone  Confirmed with patient his Friday appts for IVF beginning this Friday, 11/14/14,  through Friday, 12/05/14.  He verbalized understanding of appt times and that all appts are at [facility].  Time Spent with Patient 15

## 2014-11-13 ENCOUNTER — Ambulatory Visit (INDEPENDENT_AMBULATORY_CARE_PROVIDER_SITE_OTHER): Payer: Medicare Other | Admitting: Pulmonary Disease

## 2014-11-13 ENCOUNTER — Encounter: Payer: Self-pay | Admitting: Pulmonary Disease

## 2014-11-13 VITALS — BP 108/68 | HR 55 | Temp 97.0°F | Wt 154.6 lb

## 2014-11-13 DIAGNOSIS — I950 Idiopathic hypotension: Secondary | ICD-10-CM | POA: Diagnosis not present

## 2014-11-13 DIAGNOSIS — C089 Malignant neoplasm of major salivary gland, unspecified: Secondary | ICD-10-CM | POA: Diagnosis not present

## 2014-11-13 DIAGNOSIS — R269 Unspecified abnormalities of gait and mobility: Secondary | ICD-10-CM | POA: Diagnosis not present

## 2014-11-13 DIAGNOSIS — J441 Chronic obstructive pulmonary disease with (acute) exacerbation: Secondary | ICD-10-CM | POA: Diagnosis not present

## 2014-11-13 DIAGNOSIS — R1310 Dysphagia, unspecified: Secondary | ICD-10-CM | POA: Diagnosis not present

## 2014-11-13 DIAGNOSIS — J40 Bronchitis, not specified as acute or chronic: Secondary | ICD-10-CM | POA: Diagnosis not present

## 2014-11-13 MED ORDER — LEVALBUTEROL HCL 0.63 MG/3ML IN NEBU: 0.6300 mg | INHALATION_SOLUTION | Freq: Three times a day (TID) | RESPIRATORY_TRACT | Status: DC

## 2014-11-13 MED ORDER — METHYLPREDNISOLONE ACETATE 80 MG/ML IJ SUSP
80.0000 mg | Freq: Once | INTRAMUSCULAR | Status: AC
  Administered 2014-11-13: 80 mg via INTRAMUSCULAR

## 2014-11-13 MED ORDER — LEVALBUTEROL HCL 0.63 MG/3ML IN NEBU
0.6300 mg | INHALATION_SOLUTION | Freq: Once | RESPIRATORY_TRACT | Status: AC
  Administered 2014-11-13: 0.63 mg via RESPIRATORY_TRACT

## 2014-11-13 NOTE — Patient Instructions (Signed)
Today we updated your med list in our EPIC system...    Continue your current medications the same...  We decided to start you on a NEBULIZER w/ XOPENEX- use the treatments 3 times daily...  Follow the breathing treatment w/ a "postural drainage" treatment  by lying w/ your head & chest in the downsloping position to facilitate drainage from the bottom of your lungs... (cough while you are in this position & try to clear out the mucus)...  Start on the OTC MUCINEX 600mg  - one tab 4 times daily w/ some extra fluids by mouth... (this helps loosen the thick phlegm making it easier to drain out & expectorate)  For the inflammation- we gave you a Depo shot & a PREDNISONE Dosepak (start on this tomorrow & follow directions on the pack)  Call for any questions...   You should plana follow up visit w/ DrRamaswami in about 3 weeks.Marland KitchenMarland Kitchen

## 2014-11-13 NOTE — Progress Notes (Signed)
Subjective:    Patient ID: Adrian West, male    DOB: 10-22-1927, 81 y.o.   MRN: [id number]  HPI  I had seen him 10/22/13 as followup from his visit to [facility] [place], [place]. 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 Instead she was concerned about possible a) aspiration and b) ILD c) CLL. Precise notes are unavailable at this time as well although as he was exiting the office I noticed he had the GI notes from [place]; I will review these IT appears that 10/05/13 he arrived in [place], [place]  And by 10/07/13 he developed cough, congestion and felt sick. It appears that CT scan of chest wihtin a few days of arrival   (10/07/13 per appt records he ha with him) did not show any penumonia (date and image NA but on its way from Dr Jeannine Kitten via [facility]). Review of some records he has with him shows urgent care visit 10/09/13 with sudden onset cough x 1 day and hypoxemia. He refused admission, ER visit and CXR. He then says, he continued to be sick and on 10/15/13 he showed me a CXR report at [facility], [place] showing new Left Upper Mid Zone pneumonia (he did not show me this at last visit). He arrived back in [place] on 51/3/15 and was taken straight to local ER where pneumonia was diagnosed and giving levaquin (meanwhile Dr Jeannine Kitten had called in  on agumenting + doxy but he never took this). When I saw him 10/22/13 advised him to continue levaquin 5 more days. Same day PCP [name], MD did CXR that showed LUL mass like infitlrate. Then on 10/31/13 had CT chest whtat showed 4.7cm lingular.left upper lobe mass like density. Our radiolgoist concerned it was cancer. I then contacted Dr Jeannine Kitten who reviewed her CT and images of 10/31/13 I ahd emailed; the current mass like density is NEW. They called me in panic. Wife was upset and felt he needed more abx. I initially wanted him on augmentin but he told  my triage he did not have this anymore. So we called in levaquin again for 7 days. Now 2-3 more days of levaquin remaining. Wife says cough, congestion are improved but still with decreased po, decreased appetite and also weight loss of 20# since arriving in [place] 10/05/13 one month ago . HE is complaining of persistent vertigo  ~  OV 11/26/2013:  Chief Complaint  Patient presents with  . Follow-up    CXR done today--Still having sob with exertion.  Denies wheezing, tightness or cough. Discuss results of testing done at [facility] in [place]  He is feeling better after his pneumonia Rx. In fact he says best he has felt in long time. I only got thevarious records of his [place] workup few to several days ago. There is a stack of them; not reviewed them yet. In any event, need to have this pneumonia resolve completely before implementing their recommendation. CXR 11/26/13 shos significant  Improvement but radiology is concerned about underlying mass; DR Jeannine Kitten over phone had not indicated presence of mass  11/26/2013   CLINICAL DATA:  Followup pneumonia.  EXAM: CHEST  2 VIEW  COMPARISON:  10/22/2013, 09/17/2012 and chest CT 10/31/2013  FINDINGS: Lungs are adequately inflated with chronic bibasilar interstitial disease. There is continued opacification of the left perihilar/lingular region slightly improved. No definite  effusion. Cardiomediastinal silhouette and remainder of the exam is unchanged.  IMPRESSION: Improving left perihilar/lingular opacification suggesting improving infection, however cannot exclude underlying mass as suggested on recent chest CT. Recommend continued followup to resolution and followup CT if this abnormality persists.  Chronic bibasilar interstitial disease.   Electronically Signed   By: Marin Olp M.D.   On: 11/26/2013 10:02  ~  OV 01/10/2014 Chief Complaint  Patient presents with  . Follow-up    Pt here after CT scan. Pt states he has no change in breathing. Denies CP  and cough. C/o doe.   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  Since June 2015 visits are followup of a [facility] 2015  [facility] [place] [facility]. Then had several series of recommendations for [place]  to continue in terms of dyspnea workup and mgmt. Mostly it seems that diaphragm function is improved and they are recommending re-testing his heart completely (right heart and left). However, this has been on hold due to him developing left upper lobe pneumonia during trip to [place], [place] in May 2015. Today he is here with followup of the CT scan for followup of pneumonia. The official report is pending but in my opinion the pneumonia is 90% resolved. He still has basal interstitial lung disease changes possibly. Also of note he did have a CT abdomen to evaluate his renal mass which is status post cryoablation the official results of this are also pending. NEW ISSUE: Off note, in the last 10 days he has developed acute onset left eyelid twitching along with right parotid swelling without any pain. This was supported by his hematologist Steele Sizer is undergoing CLL evaluation] today and he has an appointment with ENT pending today Ct Chest W Contrast 01/10/2014   IMPRESSION: Improved aeration of the left upper lobe with mild linear opacity, most in keeping with resolving pneumonia with mild residual infiltrate versus scarring.  Decreased size of mediastinal lymph nodes, with a subcarinal node measuring 1.5 cm short axis warranting continued attention on follow-up.  Dilated right and left pulmonary arteries may reflect pulmonary arterial hypertension. Bilateral lower lobe opacities/ scarring persists.  Post ablation changes lower pole left kidney. Higher attenuation component along the superior rim is indeterminate (likely post treatment however cannot exclude a residual enhancing component as no unenhanced images were  obtained). Recommend renal MRI.  Indeterminate sub cm hypodensity upper/ interpolar left kidney, unchanged.   Electronically Signed   By: Carlos Levering M.D.   On: 01/10/2014 16:27   ~  OV 02/05/2014 Chief Complaint  Patient presents with  . Follow-up    Pt states he has bells palsy on right side. Pt c/o DOE, dry hacking cough. Pt states he had "severe chest pain for 5 minutes". Pt stated he did not take anything to alleviate CP but relaxed and the CP resolved.   Followup multifactorial dyspnea - His workup recommendations from [facility] has been interrupted because of left upper lobe pneumonia sustained while he was in [place] in May 2015. It is now resolved. We tried to initiate workup for dyspnea again but then at last visit he developed some problems with his face and so his dyspnea workup had to be deferred again. He now tells me that he has been diagnosed with Bell's palsy by ENT and he is on no specific treatment. Continues to have profound dyspnea at rest and with exertion. Class 3 levels. He  is still frustrated. He has appointment with cardiology now for a cardiac stress test. I am not sure if a right heart evaluation is pending but I have written to cardiology about this. Of note he wants to repeat  SNIFTT  testing show that his diaphragm function has returned as they've said in [place]. Of note, also for the first time he desaturated with exertion and he has new onset crackles in his lower lobes. Past medical history: He has new onset of loss of appetite for the last 2 weeks and loss of taste. I've asked him to talk to his primary care physician about this. He also had a transient chest pain but his cardiology appointment pending   ~  03/10/14:  ROV w/ MR>    Chief Complaint  Patient presents with  . Follow-up    Pt still has bell's palsy on right side. Pt states his breathing has imporved since last OV. Pt denies cough and CP/tightness.   Followup Dyspnea - Dyspnea  continues. He had cardiac cath right heart and was normal. TOday CT shows persistentce of definite ILD findings. Possible UIP pattern. Dr Jeannine Kitten at [facility] recommend GI swallow eval and bronch bal with concern for aspiration. Last visit he did desaturate with exertion but today he did not. His ono was abnormal but he decline o2  - the over-arching issue is that his "bell's palsy" got worse. HE has permanent lid lag and red right conjunctiva. 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 Dr Redmond Baseman thinks ENT workup should get top priority over everything else at this moment. We agreed. Patient himself wants to go to [place] to see the [facility] with [place]. Feels he is a84 and does not mind the risk. Feels he has lived his life       ~   11/13/2014:  Add-on appt w/ SN>   Chief Complaint  Patient presents with  . Acute Visit    RM Pt>  add-on appt requested for bronchitis & productive cough...     Recently treated for acute bronchitic exacerbation> he had 12d of Levaquin around time of his hospitalization, then the [facility] nurse gave him another? now c/o cough, copious beige sputum production, no hemoptysis, denies f/c/s; he has chr stable DOE; he is concerned about the congestion because he doesn't want it to go into pneumonia again he says; on no breathing treatments or meds...     Hx pneumonia that looked like a 5cm mass on CXR 5/15 in [place]> this resolved on serial CXRs/ scans...    COPD, GOLD Stage2, w/ chronic bronchitis> he is a never smoker & denies prior hx of any lung problems.    Decrease right diaphragmatic function => hx of restrictive physiology at that time    DOE after 50 feet which is sl improved for him compared to his recent symptoms.    Hx MV repair 2010 for MVP w/ severeMR (post-op noted to have the right phrenic nerve paralysis)> followed by DrMcLean, seen 5/16 7 his note is  reviewed...    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     S/P hosp by [facility] 5/23 - 10/29/14 for dehydration, post hypotension (given IVF & meds adjusted), acute bronchitis (treated w/ 12d of Levaquin)    S/P plastic surg on right eyelid (ectropion repair right lower lid) at [facility] 5/16  EXAM reveals Afeb, VSS, O2sat=99% on RA at rest;  HEENT> s/p extensive right head surg w/ partial ear removal, parotid surg, & bells palsy;  Chest> cough, congestion, bibasilar rhonchi, min exp wheezing, no consolidation;  Heart> irreg w/o m/r/g heard;  Abd> soft, neg;  Ext> w/o c/c/e...   Spirometry 5/15 in [place] showed FVC=2.68 (73%), FEV1=1.65 (65%), %1sec=62, mid-flows reduced at 45% predicted...this is c/w mod airflow obstruction and GOLD Stage 2 COPD.  Hi res CT Chest 10/15 showed some bibasilar scarring & ground glass opacities  CT scan of neck 10/22/14 at [facility] post op changes, w/o signs of residual or recurrent dis.  Last 2DEcho 5/16 showed norm LVF w/ EF=60-65%, no regional wall motion abn, Gr1DD, mod LAdil (16mm), mild RAdil, s/p MVrepair, norm PAs...  Last CXR was 10/27/14 showing s/p mitral annuloplasty, enlargement of PAs, min incr markings at lung bases/ NAD, degen changes in [facility] and post-traumatic deformity of sternum  IMP?PLAN>>  Adrian West has a complex/ evolving pulmonary picture, currently behaving like COPD/ AB and we discussed Rx w/ home nebulizer, Xopenex via NEB Tid, followed by  postural drainage technique at home & Flutter to drain his lung bases;  In this regard he will try MUCINEX 600mg  Qid w/ extra water;  For the airway inflammation we decided to give him a Depo80 shot & Rx for PRED dosepak x6d... He will f/u w/ MR in 3 weeks.    Past Medical History  Diagnosis Date  . Benign neoplasm of colon   . Tremor, essential   . Diverticulosis   . TIA (transient ischemic attack) May 2012  . Neuropathy, peripheral   . Lung abnormality 11/25/2008    "nerve damage from heart surgery"  . MGUS (monoclonal gammopathy of unknown significance) 04/11/2013  . Diaphragm paralysis   . Esophageal stricture   . Hiatal hernia   . Bell's palsy   . Vertigo 2005  . Gait disturbance   . History of radiation therapy 06/11/14-07/22/14    skin cancer of scalp/face  . Anorexia 08/16/2014  . 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 DNR (do not resuscitate) discussion 07/22/2014  . Dyspnea and respiratory abnormality 03/10/2014  . DYSPNEA/SHORTNESS OF BREATH 02/11/2010    R hemidiaphragm paralysis post mitral valve surgery 11/2008 with resultant Class 3-4 dyspnea; restrictive pattern on PFTs in excess of compromise expected with diaphragm injury (Note: ? Interstitial lung disease 10/15/13 Xray [facility]) 01/02/12 new subsegmental ATX on L (vs 09/03/11) Seeing Dr Theda Sers [facility]  Seen by Dr Corrin Parker, [facility],  02/2011. Component of thoracic height loss & alcohol related neuropathy questioned. No plication recommended  5/4- 10/14/2013 @ [facility] ; Dr  Ellis Parents.10/15/13 new focal consolidation mid anterior L lung:aspiration vs PNA . Underlying interstitial lung disease.10/15/13 Rx sent to [facility] for Doxycycline & Augmentin by Dr Jeannine Kitten ( not picked up as of 10/18/13).  Flew back to [facility]; seen @ 10/16/13 [facility], [place]. Levaquin X 5 days , Dr Evalee Mutton    . GERD 02/14/2008  Qualifier: Diagnosis of  By: [name] MD, [place] 02/14/2008        .  HYPOTHYROIDISM 02/14/2008  . Loss of weight 07/22/2014  . Memory loss 12/31/2009    Qualifier: Diagnosis of  By: [name] MD, [name]    . MITRAL REGURGITATION 02/14/2008    Qualifier: Diagnosis of  By: [name] MD, Cherlynn Kaiser MV surgery 11/2008, [name].complcated by paralysis of diaphragm     . 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 Other and unspecified hyperlipidemia 02/24/2012  . Polycythemia, secondary 01/10/2014  . PVC's (premature ventricular contractions) 04/05/2011  . SPINAL STENOSIS, LUMBAR 07/28/2010    Qualifier: Diagnosis of  By: [name] MD, [name], NS ( retired)    . Weakness generalized 07/22/2014  . Interstitial lung disease 05/05/2014  . Vertigo 2005  . Deaf   . Tremor 2015  . Squamous carcinoma   . 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 Skin cancer of scalp or skin of neck 05/21/2014    Past Surgical History  Procedure Laterality Date  . Appendectomy    . Cataract extraction      [name]  . Cholecystectomy  1998    [name]  . Inguinal hernia repair    . Lumbar laminectomy    . Nasal sinus surgery    . Tonsillectomy    . Esophageal dilation       X3;[name]  . 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 Laparoscopic ablation renal mass  08/14/2009  . Mohs surgery  2012     ear  . Tilt table study N/A 05/06/2013    Procedure: TILT TABLE STUDY;  Surgeon: [name], MD;  Location: [facility] C[facility] LAB;  Service: Cardiovascular;  Laterality: N/A;  . Right heart catheterization N/A 03/03/2014    Procedure: RIGHT HEART CATH;  Surgeon: [name], MD;  Location: [facility] CATH LAB;  Service: Cardiovascular;  Laterality: N/A;  . Partial auriculectomy Right 04/25/2014  . Parotidectomy Right 04/25/2014    Sacrifice  of the facial nerve  . Neck dissection Right 04/25/2014  . Brow lift Right 04/25/2014    Midforehead  . Ectropion repair Right 04/25/2014    Right tarsal strip  . Nasolabial repair Right 04/25/2014    Right static sling using AlloDerm graft elevation of the nasolabial fold  . Cardiac valve replacement    . Cardiac catheterization      Outpatient Encounter Prescriptions as of 11/13/2014  Medication Sig  . aspirin EC 81 MG tablet Take 81 mg by mouth daily.  Marland Kitchen atorvastatin (LIPITOR) 10 MG tablet TAKE ONE TABLET EACH DAY  . erythromycin ophthalmic ointment 1 application 2 (two) times daily. Place a thin strip into the right eye twice daily  . feeding supplement, ENSURE ENLIVE, (ENSURE ENLIVE) LIQD Take 237 mLs by mouth 3 (three) times daily between meals.  . feeding supplement, RESOURCE BREEZE, (RESOURCE BREEZE) LIQD Take 1 Container by mouth 5 (five) times daily.  Marland Kitchen levothyroxine (SYNTHROID, LEVOTHROID) 125 MCG tablet Take 1 tablet (125 mcg total) by mouth daily.    No Known Allergies   Current Medications, Allergies, Past Medical History, Past Surgical History, Family History, and Social  History were reviewed in [facility] record.   Review of Systems  Constitutional: Positive for fatigue. Negative for fever, chills and diaphoresis.  HENT: Positive for congestion, hearing loss and postnasal drip. Negative for dental problem, nosebleeds, rhinorrhea, sinus pressure, sneezing, sore throat and trouble swallowing.   Eyes: Negative for redness and itching.  Respiratory: Positive for cough, chest tightness, shortness of breath and wheezing.   Cardiovascular: Negative for chest pain, palpitations and leg swelling.  Gastrointestinal: Negative for nausea and vomiting.  Genitourinary: Negative for dysuria.  Musculoskeletal: Negative for joint swelling.  Skin: Negative for rash.  Neurological: Negative for headaches.  Hematological: Does not bruise/bleed easily.    Psychiatric/Behavioral: Negative for dysphoric mood. The patient is not nervous/anxious.       Objective:   Physical Exam  Filed Vitals:   11/13/14 1238  BP: 108/68  Pulse: 55  Temp: 97 F (36.1 C)  TempSrc: Oral  Weight: 154 lb 9.6 oz (70.126 kg)  SpO2: 99%    General:  WD, Thin, chr ill appearing 81 y/o WM in NAD; alert & oriented; pleasant & cooperative... HEENT:  Conjunctiva- pink, Sclera- nonicteric, EOM-wnl, PERRLA, right ear part amputated, canal occluded; NOSE-clear drainage; THROAT-sl red Neck:  Supple w/ decr ROM; no JVD; normal carotid impulses w/o bruits; no thyromegaly or nodules palpated; no lymphadenopathy. Chest:  decr BS bilat w/ rhonchi, congestion, end-exp wheezing,etc (no signs of consolidation) Heart:  irreg; S1 & S2 without murmurs, rubs, or gallops detected. Abdomen:  Soft & nontender- no guarding or rebound; normal bowel sounds; no organomegaly or masses palpated. Ext:  pos arthritic changes; no varicose veins, venous insuffic, or edema;  Pulses intact w/o bruits. Neuro:  Right bells palsey, gait normal & balance OK. Derm:  No lesions noted; no rash etc. Lymph:  No cervical, supraclavicular, axillary, or inguinal adenopathy palpated.       Assessment & Plan:   IMP >>  Recently treated for acute bronchitic exacerbation COPD, GOLD Stage2, w/ chronic bronchitis Decrease right diaphragmatic function DOE at 71 feet   PLAN >>  Adrian West has a complex/ evolving pulmonary picture, currently behaving like COPD/ AB and we discussed Rx w/ home nebulizer, Xopenex via NEB Tid, followed by postural drainage technique at home & Flutter to drain his lung bases;  In this regard he will try MUCINEX 600mg  Qid w/ extra water;  For the airway inflammation we decided to give him a Depo80 shot & Rx for PRED dosepak x6d... He will f/u w/ MR in 3 weeks.   Patient's Medications  New Prescriptions   PREDNISONE Dosepak >>     Take as directed on pack.   MUCINEX 600mg  tabs >>      Take one tab 4 times daily w/ fluids.   LEVALBUTEROL (XOPENEX) 0.63 MG/3ML NEBULIZER SOLUTION    Take 3 mLs (0.63 mg total) by nebulization 3 (three) times daily.  Previous Medications   ASPIRIN EC 81 MG TABLET    Take 81 mg by mouth daily.   ATORVASTATIN (LIPITOR) 10 MG TABLET    TAKE ONE TABLET EACH DAY   ERYTHROMYCIN OPHTHALMIC OINTMENT    1 application 2 (two) times daily. Place a thin strip into the right eye twice daily   FEEDING SUPPLEMENT, ENSURE ENLIVE, (ENSURE ENLIVE) LIQD    Take 237 mLs by mouth 3 (three) times daily between meals.   FEEDING SUPPLEMENT, RESOURCE BREEZE, (RESOURCE BREEZE) LIQD    Take 1 Container by mouth 5 (five) times daily.  LEVOTHYROXINE (SYNTHROID, LEVOTHROID) 125 MCG TABLET    Take 1 tablet (125 mcg total) by mouth daily.  Modified Medications   No medications on file  Discontinued Medications   No medications on file

## 2014-11-14 ENCOUNTER — Telehealth: Payer: Self-pay | Admitting: Pulmonary Disease

## 2014-11-14 ENCOUNTER — Ambulatory Visit (HOSPITAL_BASED_OUTPATIENT_CLINIC_OR_DEPARTMENT_OTHER): Payer: Medicare Other

## 2014-11-14 VITALS — BP 106/60 | HR 55 | Temp 97.6°F | Resp 17

## 2014-11-14 DIAGNOSIS — C089 Malignant neoplasm of major salivary gland, unspecified: Secondary | ICD-10-CM

## 2014-11-14 DIAGNOSIS — J441 Chronic obstructive pulmonary disease with (acute) exacerbation: Secondary | ICD-10-CM

## 2014-11-14 DIAGNOSIS — C770 Secondary and unspecified malignant neoplasm of lymph nodes of head, face and neck: Secondary | ICD-10-CM

## 2014-11-14 MED ORDER — SODIUM CHLORIDE 0.9 % IV SOLN
Freq: Once | INTRAVENOUS | Status: AC
  Administered 2014-11-14: 10:00:00 via INTRAVENOUS

## 2014-11-14 MED ORDER — LEVALBUTEROL HCL 0.63 MG/3ML IN NEBU: 0.6300 mg | INHALATION_SOLUTION | Freq: Three times a day (TID) | RESPIRATORY_TRACT | Status: DC

## 2014-11-14 MED ORDER — PREDNISONE 5 MG (21) PO TBPK: ORAL_TABLET | ORAL | Status: DC

## 2014-11-14 NOTE — Addendum Note (Signed)
Addended by: Maurice March on: 11/14/2014 04:59 PM   Modules accepted: Orders

## 2014-11-14 NOTE — Telephone Encounter (Signed)
Late add: Rx cannot be obtained by DME. Rx sent to local pharmacy. Pt aware.

## 2014-11-14 NOTE — Patient Instructions (Signed)
Dehydration, Adult Dehydration is when you lose more fluids from the body than you take in. Vital organs like the kidneys, brain, and heart cannot function without a proper amount of fluids and salt. Any loss of fluids from the body can cause dehydration.  CAUSES   Vomiting.  Diarrhea.  Excessive sweating.  Excessive urine output.  Fever. SYMPTOMS  Mild dehydration  Thirst.  Dry lips.  Slightly dry mouth. Moderate dehydration  Very dry mouth.  Sunken eyes.  Skin does not bounce back quickly when lightly pinched and released.  Dark urine and decreased urine production.  Decreased tear production.  Headache. Severe dehydration  Very dry mouth.  Extreme thirst.  Rapid, weak pulse (more than 100 beats per minute at rest).  Cold hands and feet.  Not able to sweat in spite of heat and temperature.  Rapid breathing.  Blue lips.  Confusion and lethargy.  Difficulty being awakened.  Minimal urine production.  No tears. DIAGNOSIS  Your caregiver will diagnose dehydration based on your symptoms and your exam. Blood and urine tests will help confirm the diagnosis. The diagnostic evaluation should also identify the cause of dehydration. TREATMENT  Treatment of mild or moderate dehydration can often be done at home by increasing the amount of fluids that you drink. It is best to drink small amounts of fluid more often. Drinking too much at one time can make vomiting worse. Refer to the home care instructions below. Severe dehydration needs to be treated at the hospital where you will probably be given intravenous (IV) fluids that contain water and electrolytes. HOME CARE INSTRUCTIONS   Ask your caregiver about specific rehydration instructions.  Drink enough fluids to keep your urine clear or pale yellow.  Drink small amounts frequently if you have nausea and vomiting.  Eat as you normally do.  Avoid:  Foods or drinks high in sugar.  Carbonated  drinks.  Juice.  Extremely hot or cold fluids.  Drinks with caffeine.  Fatty, greasy foods.  Alcohol.  Tobacco.  Overeating.  Gelatin desserts.  Wash your hands well to avoid spreading bacteria and viruses.  Only take over-the-counter or prescription medicines for pain, discomfort, or fever as directed by your caregiver.  Ask your caregiver if you should continue all prescribed and over-the-counter medicines.  Keep all follow-up appointments with your caregiver. SEEK MEDICAL CARE IF:  You have abdominal pain and it increases or stays in one area (localizes).  You have a rash, stiff neck, or severe headache.  You are irritable, sleepy, or difficult to awaken.  You are weak, dizzy, or extremely thirsty. SEEK IMMEDIATE MEDICAL CARE IF:   You are unable to keep fluids down or you get worse despite treatment.  You have frequent episodes of vomiting or diarrhea.  You have blood or green matter (bile) in your vomit.  You have blood in your stool or your stool looks black and tarry.  You have not urinated in 6 to 8 hours, or you have only urinated a small amount of very dark urine.  You have a fever.  You faint. MAKE SURE YOU:   Understand these instructions.  Will watch your condition.  Will get help right away if you are not doing well or get worse. Document Released: 05/23/2005 Document Revised: 08/15/2011 Document Reviewed: 01/10/2011 ExitCare Patient Information 2015 [employer]. This information is not intended to replace advice given to you by your health care provider. Make sure you discuss any questions you have with your health care   provider.  

## 2014-11-14 NOTE — Telephone Encounter (Signed)
Called and spoke to pt. Pt requesting pred, xopenex, and neb machine. DME order was canceled. Pred sent to preferred pharmacy and new DME order placed. Pt verbalized understanding and denied any further questions or concerns at this time.

## 2014-11-15 ENCOUNTER — Other Ambulatory Visit: Payer: Self-pay | Admitting: Cardiology

## 2014-11-17 ENCOUNTER — Ambulatory Visit: Payer: Self-pay

## 2014-11-17 ENCOUNTER — Telehealth: Payer: Self-pay | Admitting: Pulmonary Disease

## 2014-11-17 ENCOUNTER — Telehealth: Payer: Self-pay | Admitting: *Deleted

## 2014-11-17 DIAGNOSIS — J441 Chronic obstructive pulmonary disease with (acute) exacerbation: Secondary | ICD-10-CM | POA: Diagnosis not present

## 2014-11-17 DIAGNOSIS — R1310 Dysphagia, unspecified: Secondary | ICD-10-CM | POA: Diagnosis not present

## 2014-11-17 DIAGNOSIS — R269 Unspecified abnormalities of gait and mobility: Secondary | ICD-10-CM | POA: Diagnosis not present

## 2014-11-17 DIAGNOSIS — C089 Malignant neoplasm of major salivary gland, unspecified: Secondary | ICD-10-CM | POA: Diagnosis not present

## 2014-11-17 DIAGNOSIS — J40 Bronchitis, not specified as acute or chronic: Secondary | ICD-10-CM | POA: Diagnosis not present

## 2014-11-17 DIAGNOSIS — I950 Idiopathic hypotension: Secondary | ICD-10-CM | POA: Diagnosis not present

## 2014-11-17 NOTE — Telephone Encounter (Signed)
Called and spoke with Darnelle Spangle at [facility]. Order was received on Friday evening (11/14/14). Usually takes 3-4 business days before patients will hear from the DME to arrange. Per Rodena Piety at [employer], she will contact patient today and arrange to deliver nebulizer. Will return pt's call in about an hour to allow for [facility] to contact patient. Rhonda J Cobb

## 2014-11-17 NOTE — Telephone Encounter (Signed)
Left message advising patient that it usually takes about 4-5 days for the nebulizer to be received after it was ordered.  Advised him that order went out on 6/10.  Advised him that I would check on order and call him back.  [facility]- please check on this order.  Thanks.

## 2014-11-17 NOTE — Telephone Encounter (Signed)
Called and spoke with patient and he stated that [facility] has contacted him and he will get his nebulizer today. Nothing else needed at this time. Rhonda J Cobb

## 2014-11-17 NOTE — Telephone Encounter (Signed)
Oncology Nurse Navigator Documentation  Oncology Nurse Navigator Flowsheets 11/17/2014  Navigator Encounter Type Telephone  1. Patient called to confirm upcoming Friday appts for IVF.   2. He questioned need to have nutrition appt on 6/20 1430 because of improved oral intake.  He stated his swallowing has improved, he is not experiencing any pain with swallowing.  "I have my own system that is working well." 3. I encouraged him to accept an appt with Barb, RD, to evaluate his nutritional status in context of historical deficiencies.  I suggested that she could see him during one of his upcoming Friday IVF appts rather than his making a special trip.  He indicated agreement.  I notified Dory Peru.  Time Spent with Patient Stella, RN, BSN, [place] at [facility] [phone]

## 2014-11-19 ENCOUNTER — Telehealth: Payer: Self-pay | Admitting: *Deleted

## 2014-11-19 NOTE — Telephone Encounter (Signed)
Oncology Nurse Navigator Documentation  Oncology Nurse Navigator Flowsheets 11/19/2014  Navigator Encounter Type Telephone  Called patient again for clarification of his 6/12 VM re: interest in meeting with Wadie Lessen to discuss palliative care.  I informed him that Stanton Kidney can meet with him this Friday at 0800 in Radiation Oncology prior to his 0845 IVF appt.  He verbalized understanding.  I confirmed his acknowledgement with Stanton Kidney.  Time Spent with Patient [place], RN, BSN, [place] at [facility] [phone]

## 2014-11-21 ENCOUNTER — Ambulatory Visit (HOSPITAL_BASED_OUTPATIENT_CLINIC_OR_DEPARTMENT_OTHER): Payer: Medicare Other

## 2014-11-21 ENCOUNTER — Ambulatory Visit
Admission: RE | Admit: 2014-11-21 | Discharge: 2014-11-21 | Disposition: A | Discharge status: Home / Self Care | Payer: Medicare Other | Source: Ambulatory Visit | Attending: Radiation Oncology | Admitting: Radiation Oncology

## 2014-11-21 VITALS — BP 115/58 | HR 51 | Temp 96.9°F | Resp 18

## 2014-11-21 DIAGNOSIS — C089 Malignant neoplasm of major salivary gland, unspecified: Secondary | ICD-10-CM | POA: Diagnosis not present

## 2014-11-21 MED ORDER — SODIUM CHLORIDE 0.9 % IV SOLN
Freq: Once | INTRAVENOUS | Status: AC
  Administered 2014-11-21: 09:00:00 via INTRAVENOUS

## 2014-11-21 NOTE — Patient Instructions (Signed)
Dehydration, Adult Dehydration is when you lose more fluids from the body than you take in. Vital organs like the kidneys, brain, and heart cannot function without a proper amount of fluids and salt. Any loss of fluids from the body can cause dehydration.  CAUSES   Vomiting.  Diarrhea.  Excessive sweating.  Excessive urine output.  Fever. SYMPTOMS  Mild dehydration  Thirst.  Dry lips.  Slightly dry mouth. Moderate dehydration  Very dry mouth.  Sunken eyes.  Skin does not bounce back quickly when lightly pinched and released.  Dark urine and decreased urine production.  Decreased tear production.  Headache. Severe dehydration  Very dry mouth.  Extreme thirst.  Rapid, weak pulse (more than 100 beats per minute at rest).  Cold hands and feet.  Not able to sweat in spite of heat and temperature.  Rapid breathing.  Blue lips.  Confusion and lethargy.  Difficulty being awakened.  Minimal urine production.  No tears. DIAGNOSIS  Your caregiver will diagnose dehydration based on your symptoms and your exam. Blood and urine tests will help confirm the diagnosis. The diagnostic evaluation should also identify the cause of dehydration. TREATMENT  Treatment of mild or moderate dehydration can often be done at home by increasing the amount of fluids that you drink. It is best to drink small amounts of fluid more often. Drinking too much at one time can make vomiting worse. Refer to the home care instructions below. Severe dehydration needs to be treated at the hospital where you will probably be given intravenous (IV) fluids that contain water and electrolytes. HOME CARE INSTRUCTIONS   Ask your caregiver about specific rehydration instructions.  Drink enough fluids to keep your urine clear or pale yellow.  Drink small amounts frequently if you have nausea and vomiting.  Eat as you normally do.  Avoid:  Foods or drinks high in sugar.  Carbonated  drinks.  Juice.  Extremely hot or cold fluids.  Drinks with caffeine.  Fatty, greasy foods.  Alcohol.  Tobacco.  Overeating.  Gelatin desserts.  Wash your hands well to avoid spreading bacteria and viruses.  Only take over-the-counter or prescription medicines for pain, discomfort, or fever as directed by your caregiver.  Ask your caregiver if you should continue all prescribed and over-the-counter medicines.  Keep all follow-up appointments with your caregiver. SEEK MEDICAL CARE IF:  You have abdominal pain and it increases or stays in one area (localizes).  You have a rash, stiff neck, or severe headache.  You are irritable, sleepy, or difficult to awaken.  You are weak, dizzy, or extremely thirsty. SEEK IMMEDIATE MEDICAL CARE IF:   You are unable to keep fluids down or you get worse despite treatment.  You have frequent episodes of vomiting or diarrhea.  You have blood or green matter (bile) in your vomit.  You have blood in your stool or your stool looks black and tarry.  You have not urinated in 6 to 8 hours, or you have only urinated a small amount of very dark urine.  You have a fever.  You faint. MAKE SURE YOU:   Understand these instructions.  Will watch your condition.  Will get help right away if you are not doing well or get worse. Document Released: 05/23/2005 Document Revised: 08/15/2011 Document Reviewed: 01/10/2011 ExitCare Patient Information 2015 [employer]. This information is not intended to replace advice given to you by your health care provider. Make sure you discuss any questions you have with your health care   provider.  

## 2014-11-24 ENCOUNTER — Ambulatory Visit: Payer: Self-pay

## 2014-11-24 ENCOUNTER — Inpatient Hospital Stay: Payer: Medicare Other | Admitting: Internal Medicine

## 2014-11-24 ENCOUNTER — Encounter: Payer: Self-pay | Admitting: Nutrition

## 2014-11-24 DIAGNOSIS — R269 Unspecified abnormalities of gait and mobility: Secondary | ICD-10-CM | POA: Diagnosis not present

## 2014-11-24 DIAGNOSIS — I950 Idiopathic hypotension: Secondary | ICD-10-CM | POA: Diagnosis not present

## 2014-11-24 DIAGNOSIS — R1310 Dysphagia, unspecified: Secondary | ICD-10-CM | POA: Diagnosis not present

## 2014-11-24 DIAGNOSIS — C089 Malignant neoplasm of major salivary gland, unspecified: Secondary | ICD-10-CM | POA: Diagnosis not present

## 2014-11-24 DIAGNOSIS — J40 Bronchitis, not specified as acute or chronic: Secondary | ICD-10-CM | POA: Diagnosis not present

## 2014-11-25 ENCOUNTER — Telehealth: Payer: Self-pay | Admitting: Internal Medicine

## 2014-11-25 NOTE — Telephone Encounter (Signed)
Patient no showed on hospital f/u yesterday 6/20. Please advise

## 2014-11-25 NOTE — Telephone Encounter (Signed)
Please reschedule, thanks

## 2014-11-25 NOTE — Telephone Encounter (Signed)
Talked with patient and he did not know he had an appointment yesterday. He is following up with Dr. Lenna Gilford on the issue so he decided not to make an appointment

## 2014-11-25 NOTE — Telephone Encounter (Signed)
Please call and try to reschedule if they want. If he is more palliative that is fine to not reschedule.

## 2014-11-26 ENCOUNTER — Telehealth: Payer: Self-pay | Admitting: *Deleted

## 2014-11-26 NOTE — Telephone Encounter (Signed)
Oncology Nurse Navigator Documentation  Oncology Nurse Navigator Flowsheets 11/26/2014  Navigator Encounter Type Telephone  Called patient to inform him that Friday's IVF appt has been rescheduled for 2:15 PM.  He verbalized understanding.  Time Spent with Patient 15

## 2014-11-26 NOTE — Telephone Encounter (Signed)
Oncology Nurse Navigator Documentation  Oncology Nurse Navigator Flowsheets 11/26/2014  Navigator Encounter Type Telephone  Patient called to cancel Friday morning IVF d/t conflict with 2 other important medical appts.  He asked to be rescheduled for Friday afternoon.  I indicated I would f/u with Infusion and get back to him.  Time Spent with Patient Boone, RN, BSN, [place] at [place] [phone]

## 2014-11-27 DIAGNOSIS — J40 Bronchitis, not specified as acute or chronic: Secondary | ICD-10-CM | POA: Diagnosis not present

## 2014-11-27 DIAGNOSIS — R269 Unspecified abnormalities of gait and mobility: Secondary | ICD-10-CM | POA: Diagnosis not present

## 2014-11-27 DIAGNOSIS — C089 Malignant neoplasm of major salivary gland, unspecified: Secondary | ICD-10-CM | POA: Diagnosis not present

## 2014-11-27 DIAGNOSIS — R1310 Dysphagia, unspecified: Secondary | ICD-10-CM | POA: Diagnosis not present

## 2014-11-27 DIAGNOSIS — I950 Idiopathic hypotension: Secondary | ICD-10-CM | POA: Diagnosis not present

## 2014-11-28 ENCOUNTER — Ambulatory Visit (INDEPENDENT_AMBULATORY_CARE_PROVIDER_SITE_OTHER): Payer: Medicare Other | Admitting: Adult Health

## 2014-11-28 ENCOUNTER — Ambulatory Visit (HOSPITAL_BASED_OUTPATIENT_CLINIC_OR_DEPARTMENT_OTHER): Payer: Medicare Other

## 2014-11-28 ENCOUNTER — Encounter: Payer: Self-pay | Admitting: Adult Health

## 2014-11-28 VITALS — BP 112/44 | HR 53 | Temp 97.3°F

## 2014-11-28 VITALS — BP 108/62 | HR 50 | Temp 97.8°F | Ht 71.0 in | Wt 154.0 lb

## 2014-11-28 DIAGNOSIS — C089 Malignant neoplasm of major salivary gland, unspecified: Secondary | ICD-10-CM

## 2014-11-28 DIAGNOSIS — H16211 Exposure keratoconjunctivitis, right eye: Secondary | ICD-10-CM | POA: Diagnosis not present

## 2014-11-28 DIAGNOSIS — H04221 Epiphora due to insufficient drainage, right lacrimal gland: Secondary | ICD-10-CM | POA: Diagnosis not present

## 2014-11-28 DIAGNOSIS — G510 Bell's palsy: Secondary | ICD-10-CM | POA: Diagnosis not present

## 2014-11-28 DIAGNOSIS — H02102 Unspecified ectropion of right lower eyelid: Secondary | ICD-10-CM | POA: Diagnosis not present

## 2014-11-28 DIAGNOSIS — J441 Chronic obstructive pulmonary disease with (acute) exacerbation: Secondary | ICD-10-CM | POA: Diagnosis not present

## 2014-11-28 DIAGNOSIS — C770 Secondary and unspecified malignant neoplasm of lymph nodes of head, face and neck: Secondary | ICD-10-CM

## 2014-11-28 MED ORDER — SODIUM CHLORIDE 0.9 % IV SOLN
INTRAVENOUS | Status: DC
  Administered 2014-11-28: 15:00:00 via INTRAVENOUS

## 2014-11-28 NOTE — Patient Instructions (Signed)
Dehydration, Adult Dehydration is when you lose more fluids from the body than you take in. Vital organs like the kidneys, brain, and heart cannot function without a proper amount of fluids and salt. Any loss of fluids from the body can cause dehydration.  CAUSES   Vomiting.  Diarrhea.  Excessive sweating.  Excessive urine output.  Fever. SYMPTOMS  Mild dehydration  Thirst.  Dry lips.  Slightly dry mouth. Moderate dehydration  Very dry mouth.  Sunken eyes.  Skin does not bounce back quickly when lightly pinched and released.  Dark urine and decreased urine production.  Decreased tear production.  Headache. Severe dehydration  Very dry mouth.  Extreme thirst.  Rapid, weak pulse (more than 100 beats per minute at rest).  Cold hands and feet.  Not able to sweat in spite of heat and temperature.  Rapid breathing.  Blue lips.  Confusion and lethargy.  Difficulty being awakened.  Minimal urine production.  No tears. DIAGNOSIS  Your caregiver will diagnose dehydration based on your symptoms and your exam. Blood and urine tests will help confirm the diagnosis. The diagnostic evaluation should also identify the cause of dehydration. TREATMENT  Treatment of mild or moderate dehydration can often be done at home by increasing the amount of fluids that you drink. It is best to drink small amounts of fluid more often. Drinking too much at one time can make vomiting worse. Refer to the home care instructions below. Severe dehydration needs to be treated at the hospital where you will probably be given intravenous (IV) fluids that contain water and electrolytes. HOME CARE INSTRUCTIONS   Ask your caregiver about specific rehydration instructions.  Drink enough fluids to keep your urine clear or pale yellow.  Drink small amounts frequently if you have nausea and vomiting.  Eat as you normally do.  Avoid:  Foods or drinks high in sugar.  Carbonated  drinks.  Juice.  Extremely hot or cold fluids.  Drinks with caffeine.  Fatty, greasy foods.  Alcohol.  Tobacco.  Overeating.  Gelatin desserts.  Wash your hands well to avoid spreading bacteria and viruses.  Only take over-the-counter or prescription medicines for pain, discomfort, or fever as directed by your caregiver.  Ask your caregiver if you should continue all prescribed and over-the-counter medicines.  Keep all follow-up appointments with your caregiver. SEEK MEDICAL CARE IF:  You have abdominal pain and it increases or stays in one area (localizes).  You have a rash, stiff neck, or severe headache.  You are irritable, sleepy, or difficult to awaken.  You are weak, dizzy, or extremely thirsty. SEEK IMMEDIATE MEDICAL CARE IF:   You are unable to keep fluids down or you get worse despite treatment.  You have frequent episodes of vomiting or diarrhea.  You have blood or green matter (bile) in your vomit.  You have blood in your stool or your stool looks black and tarry.  You have not urinated in 6 to 8 hours, or you have only urinated a small amount of very dark urine.  You have a fever.  You faint. MAKE SURE YOU:   Understand these instructions.  Will watch your condition.  Will get help right away if you are not doing well or get worse. Document Released: 05/23/2005 Document Revised: 08/15/2011 Document Reviewed: 01/10/2011 ExitCare Patient Information 2015 [employer]. This information is not intended to replace advice given to you by your health care provider. Make sure you discuss any questions you have with your health care   provider.  

## 2014-11-28 NOTE — Assessment & Plan Note (Signed)
Recent exacerbation , now resolving  PLAN Continue on current regimen  May use Xopenex Neb As needed   follow up Dr. [name] in 2-3 months and As needed

## 2014-11-28 NOTE — Progress Notes (Signed)
   Subjective:    Patient ID: Adrian West, male    DOB: [birth year], 81 y.o.   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 He was also given  Xopenex nebulizer . He returns today reporting that he is improved with decreased cough and shortness of breath. Occasional prod cough with clear mucus.  He denies any hemoptysis, orthopnea, PND, increased leg swelling, or fever.   Review of Systems  Constitutional:   No  weight loss, night sweats,  Fevers, chills, + fatigue, or  lassitude.  HEENT:   No headaches,  +Difficulty swallowing,  Tooth/dental problems, or  Sore throat,                No sneezing, itching, ear ache, nasal congestion, post nasal drip,   CV:  No chest pain,  Orthopnea, PND, swelling in lower extremities, anasarca, dizziness, palpitations, syncope.   GI  No heartburn, indigestion, abdominal pain, nausea, vomiting, diarrhea, change in bowel habits, loss of appetite, bloody stools.   Resp: .  No chest wall deformity  Skin: no rash or lesions.  GU: no dysuria, change in color of urine, no urgency or frequency.  No flank pain, no hematuria   MS:  No joint pain or swelling.  No decreased range of motion.  No back pain.  Psych:  No change in mood or affect. No depression or anxiety.  No memory loss.          Objective:   Physical Exam GEN: A/Ox3; pleasant , NAD, Frail and elderly  HEENT:  [place]/AT,  EACs-clear, TMs-wnl, NOSE-clear, THROAT-clear, no lesions, no postnasal drip or exudate noted.   NECK:  Supple w/ fair ROM; no JVD; normal carotid impulses w/o bruits; no thyromegaly or nodules palpated; no lymphadenopathy.  RESP  Decreased breath sounds in the  basesno accessory muscle use, no dullness to percussion  CARD:  RRR, no m/r/g  , no peripheral edema, pulses intact, no cyanosis or clubbing.  GI:   Soft & nt; nml bowel sounds; no organomegaly or masses detected.  Musco: Warm bil, no deformities or joint swelling noted.   Neuro: alert, no focal deficits noted.   Chronic right-sided facial weakness  Skin: Warm, no lesions or rashes         Assessment & Plan:

## 2014-11-28 NOTE — Assessment & Plan Note (Signed)
Continue follow with radiation oncology as planned

## 2014-11-28 NOTE — Patient Instructions (Signed)
Continue on current regimen  May use Xopenex Neb As needed   follow up Dr. [name] in 2-3 months and As needed

## 2014-12-01 ENCOUNTER — Ambulatory Visit: Payer: Self-pay

## 2014-12-01 ENCOUNTER — Ambulatory Visit
Admission: RE | Admit: 2014-12-01 | Discharge: 2014-12-01 | Disposition: A | Discharge status: Home / Self Care | Payer: Medicare Other | Source: Ambulatory Visit | Attending: Radiation Oncology | Admitting: Radiation Oncology

## 2014-12-01 ENCOUNTER — Encounter: Payer: Self-pay | Admitting: *Deleted

## 2014-12-01 ENCOUNTER — Other Ambulatory Visit: Payer: Self-pay

## 2014-12-01 VITALS — BP 107/56 | HR 62 | Temp 98.1°F | Resp 36 | Wt 155.4 lb

## 2014-12-01 DIAGNOSIS — C770 Secondary and unspecified malignant neoplasm of lymph nodes of head, face and neck: Secondary | ICD-10-CM | POA: Diagnosis not present

## 2014-12-01 NOTE — Progress Notes (Signed)
Radiation Oncology         [phone] ________________________________  Name: Adrian West MRN: [id number]  Date: 12/01/2014  DOB: 30-May-1928  Follow-Up Visit Note  CC: Olga Millers, MD  [name], MD  No diagnosis found.   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 No pain. Could be drinking more fluids, but he dislikes water.  Eating soft foods and drinking supplements.  Not sure he needs IVF any longer.  Recently saw his cardiologist and pulmonologist.  Main complaint - wishes his energy was better. Weight stable.  ALLERGIES:  has No Known Allergies.  Meds: Current Outpatient Prescriptions  Medication Sig Dispense Refill  . aspirin EC 81 MG tablet Take 81 mg by mouth daily.    Marland Kitchen atorvastatin (LIPITOR) 10 MG tablet TAKE ONE TABLET EACH DAY 30 tablet 6  . erythromycin ophthalmic ointment 1 application 2 (two) times daily. Place a thin strip into the right eye twice daily    . feeding supplement, ENSURE ENLIVE, (ENSURE ENLIVE) LIQD Take 237 mLs by mouth 3 (three) times daily between meals. 237 mL 12  . feeding supplement, RESOURCE BREEZE, (RESOURCE BREEZE) LIQD Take 1 Container by mouth 5 (five) times daily. 1 Container 99  . levalbuterol (XOPENEX) 0.63 MG/3ML nebulizer solution Take 3 mLs (0.63 mg total) by nebulization 3 (three) times daily. 270 mL 2  . levothyroxine (SYNTHROID, LEVOTHROID) 125 MCG tablet Take 1 tablet  (125 mcg total) by mouth daily. 90 tablet 1  . metoprolol succinate (TOPROL-XL) 25 MG 24 hr tablet Take 1 tablet by mouth daily.     No current facility-administered medications for this encounter.    Physical Findings:  The patient is in no acute distress. Patient is alert and oriented.     Wt Readings from Last 3 Encounters:  12/01/14 155 lb 6.4 oz (70.489 kg)  11/28/14 154 lb (69.854 kg)  11/13/14 154 lb 9.6 oz (70.126 kg)    weight is 155 lb 6.4 oz (70.489 kg). His oral temperature is 98.1 F (36.7 C). His blood pressure is 107/56 and his pulse is 62. His respiration is 36 and oxygen saturation is 78%. .  Vitals with BMI 12/01/2014 12/01/2014  Height    Weight  155 lbs 6 oz  BMI    Systolic 841 92  Diastolic 56 47  Pulse 62 90  Respirations  36   Note rise in BP and decrease in pulse with STANDING.  Gen: NAD, ambulatory No palpable masses in the upper and lower neck nor in the post or pre auricular regions. Mouth is with some salivary function. No lesions or thrush noted in the mouth. No edema in his ankles. Lungs CTAB. Skin with signs of sun damage.  Lab Findings: Lab Results  Component Value Date   WBC 4.3 10/29/2014   HGB 11.7* 10/29/2014   HCT 35.6* 10/29/2014   MCV 89.7 10/29/2014   PLT 175 10/29/2014  CMP Latest Ref Rng 10/29/2014 10/28/2014 10/27/2014  Glucose 65 - 99 mg/dL 91 101(H) 70  BUN 6 - 20 mg/dL 15 15 17   Creatinine 0.61 - 1.24 mg/dL 0.67 0.74 0.80  Sodium 135 - 145 mmol/L 137 137 135  Potassium 3.5 - 5.1 mmol/L 4.0 4.2 4.7  Chloride 101 - 111 mmol/L 106 105 105  CO2 22 - 32 mmol/L 24 25 -  Calcium 8.9 - 10.3 mg/dL 8.8(L) 8.8(L) -  Total Protein 6.5 - 8.1 g/dL - - -  Total Bilirubin 0.3 - 1.2 mg/dL - - -  Alkaline Phos 38 - 126 U/L - - -  AST 15 - 41 U/L - - -  ALT 17 - 63 U/L - - -    Lab Results  Component Value Date   TSH 4.448 10/27/2014    Radiographic Findings: No results found.   AT [facility] CT Neck/Thyroid W Contrast (10/22/2014  1:22 PM)  Was without evidence of disease per report.      Impression/Plan:    1) Head and Neck Cancer Status:  NED    2) Nutritional Status: encouraged to drink more.  He will tried decaf iced tea, which he prefers to H20.    3) Risk Factors: patient knows to use good "sun hygiene"   4) Swallowing: has been seen by SLP and advised to continue with current diet as tolerated  5) Dental: Encouraged to continue regular followup with dentistry, and dental hygiene per Dr [name].  6) Thyroid function: preexisting condition of hypothyroidism   - on medication.  Lab Results  Component Value Date   TSH 4.448 10/27/2014    7) Social: Wife and pt plan to vacation in [place] in a week or two.  8) Other: PT via [facility]   9) Discontinue weekly IVF and increase PO intake of iced tea(decaf)  10) f/u with Dr [name] with repeat imaging in ~ 19mo  F/u with me in ~6wks  Call if any concerns.  _____________________________________   [name], MD

## 2014-12-01 NOTE — Progress Notes (Signed)
Oncology Nurse Navigator Documentation  Oncology Nurse Navigator Flowsheets 12/01/2014  Navigator Encounter Type Clinic/MDC  Patient Visit Type Radonc  To provide support and encouragement, care continuity and to assess for needs, met with patient during f/u appt with Dr. Isidore Moos.  He was accompanied by his dtrs [name] and [place].  He reported:  Denied difficulty swallowing.  Drinking 2 Ensure, 3 Breeze daily.  Eating variety of soft foods.  Weight stable.  Appt with Dr. [name] in August.  Had cleaning with personal dentist last week. He was encouraged by Dr. Isidore Moos and myself to increase fluid intake, water and decaf iced tea. He understands he can contact me with needs, concerns.   Time Spent with Patient Betances, RN, BSN, [place] at [place] [phone]

## 2014-12-01 NOTE — Progress Notes (Addendum)
Pain Status: He is currently in no pain. Vital Signs: BP: 92/47 P: 90 R:36 T:98.1 Oral Pox: 100% Orthostatic Standing Vital Signs: BP:107/56 P:62 Pox:78%  Nutritional Status a) intake: PO Diet: Soft, Ensure-2 cans and Beeze-3 cans b) using a feeding tube?: no c) weight changes, if any:  Wt Readings from Last 3 Encounters:  12/01/14 155 lb 6.4 oz (70.489 kg)  11/28/14 154 lb (69.854 kg)  11/13/14 154 lb 9.6 oz (70.126 kg)    Swallowing Status: Pt denies dysphagia.  Smoking or chewing tobacco? no   Dental (if applicable): When was last visit with dentistry Week of June 20th Using fluoride trays daily? Yes   When was last ENT visit? March 2016  When is next ENT visit? August 2016  Summary of last medical oncology visit (if applicable) is.   Next med/onc visit is on.  Imaging done in the last month (if applicable) revealed:   Other notable issues, if any: Reports continued tearing and blurring to left eye. Dry mouth, reports occasional clear moderate consistency sputum. He reports continued fatigue. Pt BP increased when performing orthostatic vital signs.  However his POX decreased.   Continued to monitor POX while sitting for several minutes.  Oxygen increased to 100%

## 2014-12-02 DIAGNOSIS — I950 Idiopathic hypotension: Secondary | ICD-10-CM | POA: Diagnosis not present

## 2014-12-02 DIAGNOSIS — J40 Bronchitis, not specified as acute or chronic: Secondary | ICD-10-CM | POA: Diagnosis not present

## 2014-12-02 DIAGNOSIS — R1310 Dysphagia, unspecified: Secondary | ICD-10-CM | POA: Diagnosis not present

## 2014-12-02 DIAGNOSIS — R269 Unspecified abnormalities of gait and mobility: Secondary | ICD-10-CM | POA: Diagnosis not present

## 2014-12-02 DIAGNOSIS — C089 Malignant neoplasm of major salivary gland, unspecified: Secondary | ICD-10-CM | POA: Diagnosis not present

## 2014-12-04 DIAGNOSIS — C089 Malignant neoplasm of major salivary gland, unspecified: Secondary | ICD-10-CM | POA: Diagnosis not present

## 2014-12-04 DIAGNOSIS — R269 Unspecified abnormalities of gait and mobility: Secondary | ICD-10-CM | POA: Diagnosis not present

## 2014-12-04 DIAGNOSIS — I950 Idiopathic hypotension: Secondary | ICD-10-CM | POA: Diagnosis not present

## 2014-12-04 DIAGNOSIS — J40 Bronchitis, not specified as acute or chronic: Secondary | ICD-10-CM | POA: Diagnosis not present

## 2014-12-04 DIAGNOSIS — R1310 Dysphagia, unspecified: Secondary | ICD-10-CM | POA: Diagnosis not present

## 2014-12-05 ENCOUNTER — Encounter: Payer: Self-pay | Admitting: Nutrition

## 2014-12-05 ENCOUNTER — Ambulatory Visit: Payer: Self-pay

## 2014-12-09 ENCOUNTER — Ambulatory Visit: Payer: Self-pay

## 2014-12-09 DIAGNOSIS — J40 Bronchitis, not specified as acute or chronic: Secondary | ICD-10-CM | POA: Diagnosis not present

## 2014-12-09 DIAGNOSIS — R269 Unspecified abnormalities of gait and mobility: Secondary | ICD-10-CM | POA: Diagnosis not present

## 2014-12-09 DIAGNOSIS — C089 Malignant neoplasm of major salivary gland, unspecified: Secondary | ICD-10-CM | POA: Diagnosis not present

## 2014-12-09 DIAGNOSIS — I950 Idiopathic hypotension: Secondary | ICD-10-CM | POA: Diagnosis not present

## 2014-12-09 DIAGNOSIS — R1310 Dysphagia, unspecified: Secondary | ICD-10-CM | POA: Diagnosis not present

## 2014-12-11 DIAGNOSIS — I950 Idiopathic hypotension: Secondary | ICD-10-CM | POA: Diagnosis not present

## 2014-12-11 DIAGNOSIS — C089 Malignant neoplasm of major salivary gland, unspecified: Secondary | ICD-10-CM | POA: Diagnosis not present

## 2014-12-11 DIAGNOSIS — R269 Unspecified abnormalities of gait and mobility: Secondary | ICD-10-CM | POA: Diagnosis not present

## 2014-12-11 DIAGNOSIS — R1310 Dysphagia, unspecified: Secondary | ICD-10-CM | POA: Diagnosis not present

## 2014-12-11 DIAGNOSIS — J40 Bronchitis, not specified as acute or chronic: Secondary | ICD-10-CM | POA: Diagnosis not present

## 2014-12-12 ENCOUNTER — Ambulatory Visit: Payer: Self-pay

## 2014-12-17 DIAGNOSIS — J441 Chronic obstructive pulmonary disease with (acute) exacerbation: Secondary | ICD-10-CM | POA: Diagnosis not present

## 2015-01-14 ENCOUNTER — Other Ambulatory Visit: Payer: Self-pay | Admitting: Hematology and Oncology

## 2015-01-15 ENCOUNTER — Other Ambulatory Visit: Payer: Medicare Other

## 2015-01-15 ENCOUNTER — Ambulatory Visit: Payer: Medicare Other | Admitting: Hematology and Oncology

## 2015-01-16 ENCOUNTER — Telehealth: Payer: Self-pay | Admitting: *Deleted

## 2015-01-16 NOTE — Telephone Encounter (Signed)
  Oncology Nurse Navigator Documentation   Navigator Encounter Type: Telephone (01/16/15 1611)     Patient called stating he and his wife will be vacationing in [place] until Labor Day, requested that his 8/19 appt with Dr. Isidore Moos be rescheduled after 02/09/15.  He can be reached at [phone] to reschedule.  I notified Enid Derry.

## 2015-01-17 DIAGNOSIS — J441 Chronic obstructive pulmonary disease with (acute) exacerbation: Secondary | ICD-10-CM | POA: Diagnosis not present

## 2015-01-23 ENCOUNTER — Ambulatory Visit: Payer: Medicare Other | Admitting: Radiation Oncology

## 2015-01-27 DIAGNOSIS — Z85828 Personal history of other malignant neoplasm of skin: Secondary | ICD-10-CM | POA: Diagnosis not present

## 2015-01-27 DIAGNOSIS — Z923 Personal history of irradiation: Secondary | ICD-10-CM | POA: Diagnosis not present

## 2015-01-27 DIAGNOSIS — Z483 Aftercare following surgery for neoplasm: Secondary | ICD-10-CM | POA: Diagnosis not present

## 2015-01-27 DIAGNOSIS — C770 Secondary and unspecified malignant neoplasm of lymph nodes of head, face and neck: Secondary | ICD-10-CM | POA: Diagnosis not present

## 2015-01-27 DIAGNOSIS — Z9009 Acquired absence of other part of head and neck: Secondary | ICD-10-CM | POA: Diagnosis not present

## 2015-01-29 ENCOUNTER — Telehealth: Payer: Self-pay | Admitting: *Deleted

## 2015-01-29 NOTE — Telephone Encounter (Signed)
  Oncology Nurse Navigator Documentation    Navigator Encounter Type: Telephone (01/29/15 1535)         Interventions: Coordination of Care (01/29/15 1535)            Time Spent with Patient: 15 (01/29/15 1535)     Patient called to report:  Staying in [place] until 02/16/15, needs to reschedule 9/9 appt with Dr. Isidore Moos for 9/16.  Had appt with Dr. [name] 8/16, CT scan indicated no evidence of recurrence.  Has recovered 50% of baseline oral intake, has gained 5 lbs, currently weighs 155 lbs. He understands I can be contacted with concerns/needs.  Gayleen Orem, RN, BSN, [place] at [place] [phone]

## 2015-02-04 ENCOUNTER — Ambulatory Visit: Payer: Self-pay | Admitting: Internal Medicine

## 2015-02-12 ENCOUNTER — Telehealth: Payer: Self-pay | Admitting: *Deleted

## 2015-02-12 NOTE — Telephone Encounter (Signed)
  Oncology Nurse Navigator Documentation    Navigator Encounter Type: Telephone (02/12/15 1116)         Interventions: Coordination of Care (02/12/15 1116)     Mr. Pokorski called to confirm date/time of upcoming appt with Dr. Isidore Moos. He verbalized understanding his appt is tomorrow at 4:10.  Gayleen Orem, RN, BSN, [place] at [place] [phone]           Time Spent with Patient: 15 (02/12/15 1116)

## 2015-02-13 ENCOUNTER — Ambulatory Visit
Admission: RE | Admit: 2015-02-13 | Discharge: 2015-02-13 | Disposition: A | Discharge status: Home / Self Care | Payer: Medicare Other | Source: Ambulatory Visit | Attending: Radiation Oncology | Admitting: Radiation Oncology

## 2015-02-13 ENCOUNTER — Encounter: Payer: Self-pay | Admitting: Radiation Oncology

## 2015-02-13 VITALS — BP 130/69 | HR 60 | Temp 97.5°F | Ht 71.0 in | Wt 155.4 lb

## 2015-02-13 DIAGNOSIS — C770 Secondary and unspecified malignant neoplasm of lymph nodes of head, face and neck: Secondary | ICD-10-CM | POA: Diagnosis not present

## 2015-02-13 NOTE — Progress Notes (Signed)
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 He was provided with proper documentation upon request to apply for a handicap sticker. I read the notes from [facility]. Dr. [name] saw the patient on August 23rd of 2016. He had a CT scan completed that day of the neck, which showed no evidence of disease. He reports that his energy level has improved and that he is able to eat more. The patient had trouble pronouncing words and reported that his eye "tears a lot". The patient expressed major concern with his severe hearing loss and vertigo. He is frustrated in social settings because it causes issues such as, "at dinner I can't hear anything." His other major complaint was a rash that comes and goes, located on his arms and some of his back. His dermatologist is aware of this occasional rash and provides cream to treat it. He projected a healthy mental status. His wife is currently in the [facility] Emergency Room and therefore could not make this appointment with her husband.  Overall, his QOL is improving, however.  Pain  issues, if any: None Using a feeding tube?: None Weight changes, if any: Maintaining weight Wt Readings from Last 3 Encounters:  02/13/15 155 lb 6.4 oz (70.489 kg)  12/01/14 155 lb 6.4 oz (70.489 kg)  11/28/14 154 lb (69.854 kg)   Swallowing issues, if any: Swallowing without difficulty.  Denies any odonyphagia Smoking or chewing tobacco? No Using fluoride trays daily? Yes.  Daily use Last ENT visit was on: 01/27/15 and had CT Scans and has appt. In 05/2015 Other notable issues, if any: Vertigo x 10 years and thus far no relief from past medical/pharmaceutical interventions.  Ambulates with cane.   ALLERGIES:  has No Known Allergies.  Meds: Current Outpatient Prescriptions  Medication Sig Dispense Refill  . aspirin EC 81 MG tablet Take 81 mg by mouth daily.    Marland Kitchen atorvastatin (LIPITOR) 10 MG tablet TAKE ONE TABLET EACH DAY 30 tablet 6  . levothyroxine (SYNTHROID, LEVOTHROID) 125 MCG tablet Take 1 tablet (125 mcg total) by mouth daily. 90 tablet 1   No current facility-administered medications for this encounter.    Physical Findings: The patient is in no acute distress. Patient is alert and oriented. HEENT: Oral mucosa, no lesions, mucus membranes are dry No palpable lymphadenopathy supraclavicular and clavicular areas Dry skin noted on the posterior auricular region on the right Reports stable decreased sensation through the right side of the face Tearing of the right eye  Wt Readings from Last 3 Encounters:  02/13/15 155 lb  6.4 oz (70.489 kg)  12/01/14 155 lb 6.4 oz (70.489 kg)  11/28/14 154 lb (69.854 kg)    height is 5\' 11"  (1.803 m) and weight is 155 lb 6.4 oz (70.489 kg). His temperature is 97.5 F (36.4 C). His blood pressure is 130/69 and his pulse is 60. .       Lab Findings: Lab Results  Component Value Date   WBC 4.3 10/29/2014   HGB 11.7* 10/29/2014   HCT 35.6* 10/29/2014   MCV 89.7 10/29/2014   PLT 175 10/29/2014   CMP Latest Ref Rng 10/29/2014  10/28/2014 10/27/2014  Glucose 65 - 99 mg/dL 91 101(H) 70  BUN 6 - 20 mg/dL 15 15 17   Creatinine 0.61 - 1.24 mg/dL 0.67 0.74 0.80  Sodium 135 - 145 mmol/L 137 137 135  Potassium 3.5 - 5.1 mmol/L 4.0 4.2 4.7  Chloride 101 - 111 mmol/L 106 105 105  CO2 22 - 32 mmol/L 24 25 -  Calcium 8.9 - 10.3 mg/dL 8.8(L) 8.8(L) -  Total Protein 6.5 - 8.1 g/dL - - -  Total Bilirubin 0.3 - 1.2 mg/dL - - -  Alkaline Phos 38 - 126 U/L - - -  AST 15 - 41 U/L - - -  ALT 17 - 63 U/L - - -    Lab Results  Component Value Date   TSH 4.448 10/27/2014    Radiographic Findings:  as above  Impression/Plan: Sigmond Patalano is a 81 year old male presenting to clinic in regards to his Squamous cell carcinoma of skin of head. He is recovering from the effects of radiation and sugery and managing his symptoms via healthy methods. To address his vertigo and hearing symptoms the patient is referred to Dr. [name]. He expressed interest in seeing an allergist. He is to consult his PCP in regards to this request and keep his dermatologist informed of the status of his occasional rash of concern, located on both arms and some of his back. He is aware of his follow-up appointment with Dr. Danne Baxter next week to discuss possible surgery on his left ear and of his appointment with Dr. Joline Salt next week. Dr. Joline Salt is aware of the patient's appointment to discuss surgery with Dr. Danne Baxter. The patient is also aware of his appointment with Dr. Brayton Caves next week to discuss surgery on his eye.  The patient was reminded of the importance of using fluoride trays and following up with his dentist on a regular basis. He is also aware of the importance of completing annual lab work to keep track of his TSH levels. All vocalized questions and concerns have been addressed. If the patient develops any further questions or concerns in regards to his treatment and recovery, he has been encouraged to contact Dr. Isidore Moos, MD. The patient is aware of his  follow up appointment with radiation oncology to take place in March of 2017.   1) Head and Neck Cancer Status:  NED    2) Nutritional Status: no issues  3) Risk Factors: patient knows to use good "sun hygiene"   4) Swallowing: has been seen by SLP and advised to continue with current diet as tolerated  5) Dental: Encouraged to continue regular followup with dentistry, and dental hygiene per Dr [name].  6) Thyroid function: preexisting condition of hypothyroidism   - on medication.  Lab Results  Component Value Date   TSH 4.448 10/27/2014    7) Social: His wife is currently in the emergency room.  8)  Other: PT via [facility]     This document serves as a record of services personally performed by [name], MD. It was created on her behalf by Lenn Cal, a trained medical scribe. The creation of this record is based on the scribe's personal observations and the provider's statements to them. This document has been checked and approved by the attending provider.  _____________________________________   [name], MD

## 2015-02-13 NOTE — Progress Notes (Signed)
BP 130/69 mmHg  Pulse 60  Temp(Src) 97.5 F (36.4 C)  Ht 5\' 11"  (1.803 m)  Wt 155 lb 6.4 oz (70.489 kg)  BMI 21.68 kg/m2  Pain issues, if any: None Using a feeding tube?: None Weight changes, if any: Maintaining weight Wt Readings from Last 3 Encounters:  02/13/15 155 lb 6.4 oz (70.489 kg)  12/01/14 155 lb 6.4 oz (70.489 kg)  11/28/14 154 lb (69.854 kg)   Swallowing issues, if any: Swallowing without difficulty.  Denies any odonyphagia Smoking or chewing tobacco? No Using fluoride trays daily? Yes.  Daily use Last ENT visit was on: 01/27/15 and had CT Scans and has appt. In 05/2015 Other notable issues, if any: Vertigo x 10 years and thus far no relief from past medical/pharmaceutical interventions.  Ambulates with cane

## 2015-02-17 DIAGNOSIS — J441 Chronic obstructive pulmonary disease with (acute) exacerbation: Secondary | ICD-10-CM | POA: Diagnosis not present

## 2015-02-18 DIAGNOSIS — D495 Neoplasm of unspecified behavior of other genitourinary organs: Secondary | ICD-10-CM | POA: Diagnosis not present

## 2015-02-18 DIAGNOSIS — C649 Malignant neoplasm of unspecified kidney, except renal pelvis: Secondary | ICD-10-CM | POA: Diagnosis not present

## 2015-02-18 DIAGNOSIS — N529 Male erectile dysfunction, unspecified: Secondary | ICD-10-CM | POA: Diagnosis not present

## 2015-02-18 DIAGNOSIS — Z7982 Long term (current) use of aspirin: Secondary | ICD-10-CM | POA: Diagnosis not present

## 2015-02-18 DIAGNOSIS — G510 Bell's palsy: Secondary | ICD-10-CM | POA: Diagnosis not present

## 2015-02-18 DIAGNOSIS — H02103 Unspecified ectropion of right eye, unspecified eyelid: Secondary | ICD-10-CM | POA: Diagnosis not present

## 2015-02-21 IMAGING — CR DG CHEST 2V
2 series · 2 of 2 positions shown · non-contrast
Comparison: 02/09/2013

CLINICAL DATA: Cough and shortness of Breath.

EXAM:
CHEST  2 VIEW

[view not recorded (1 of 2)]
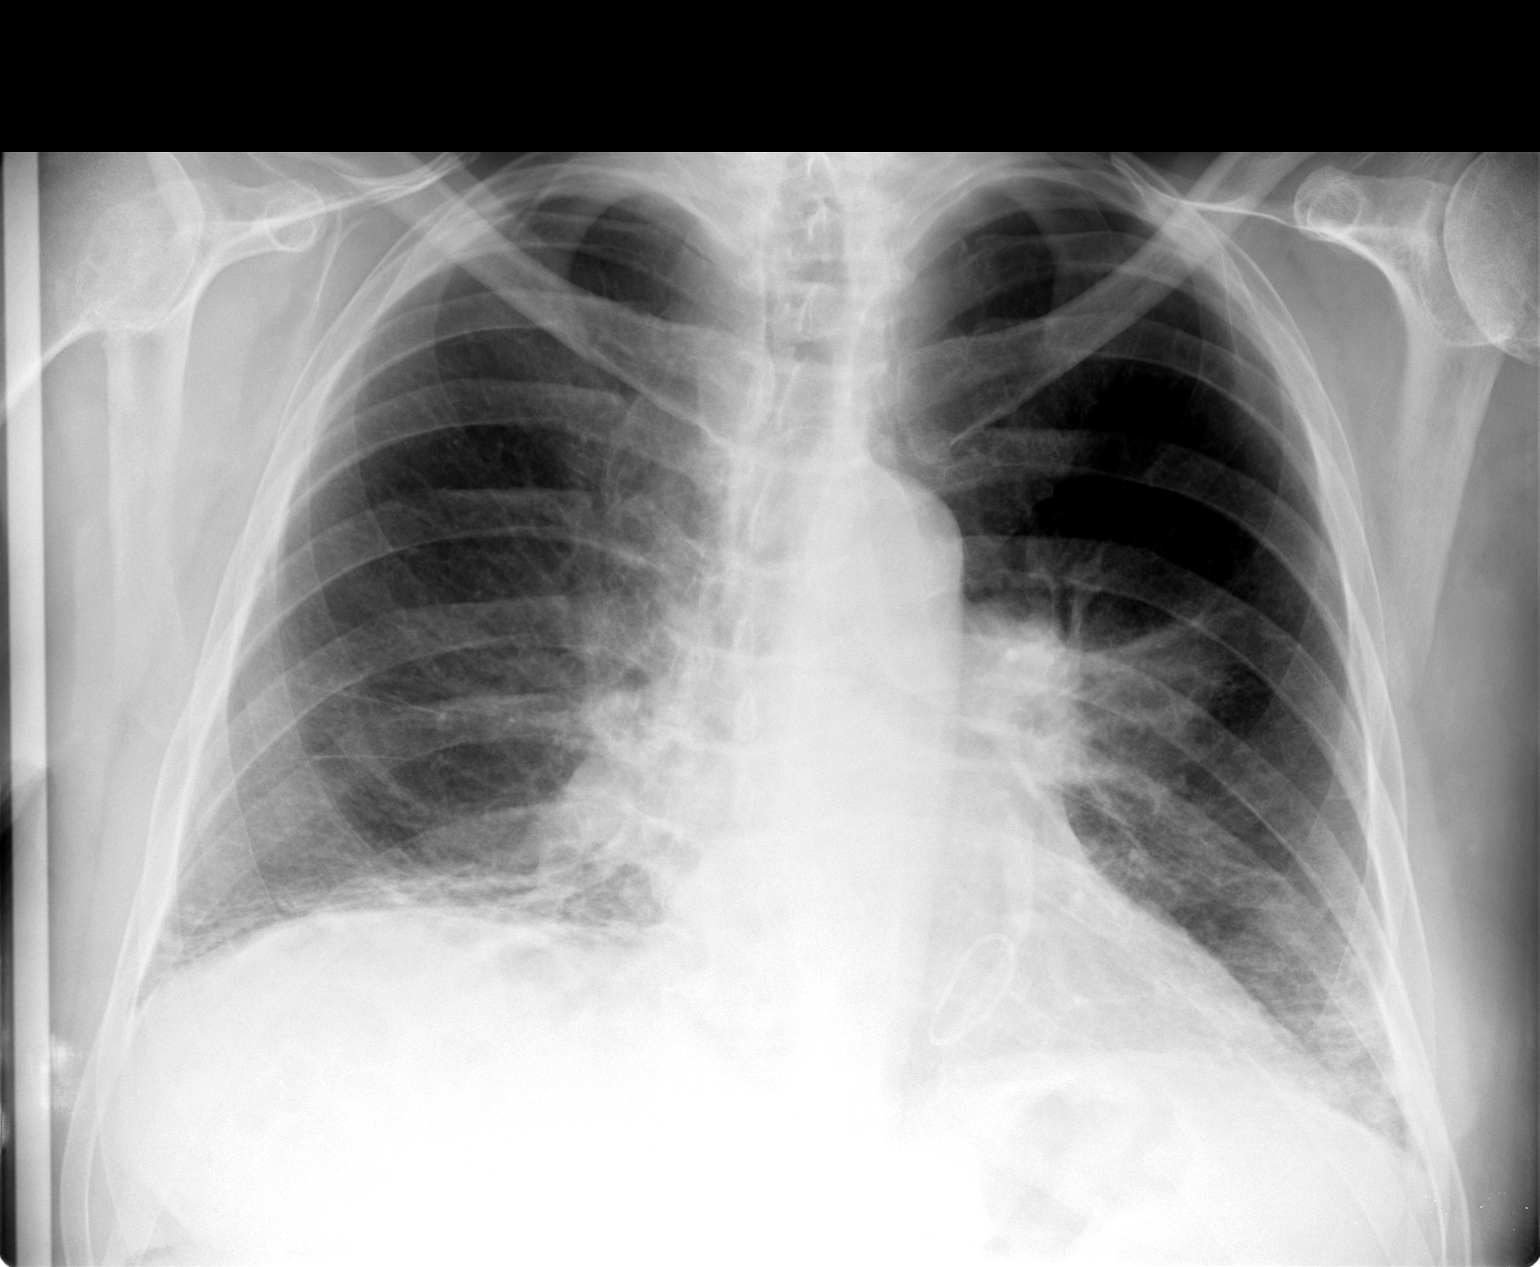

[view not recorded (2 of 2)]
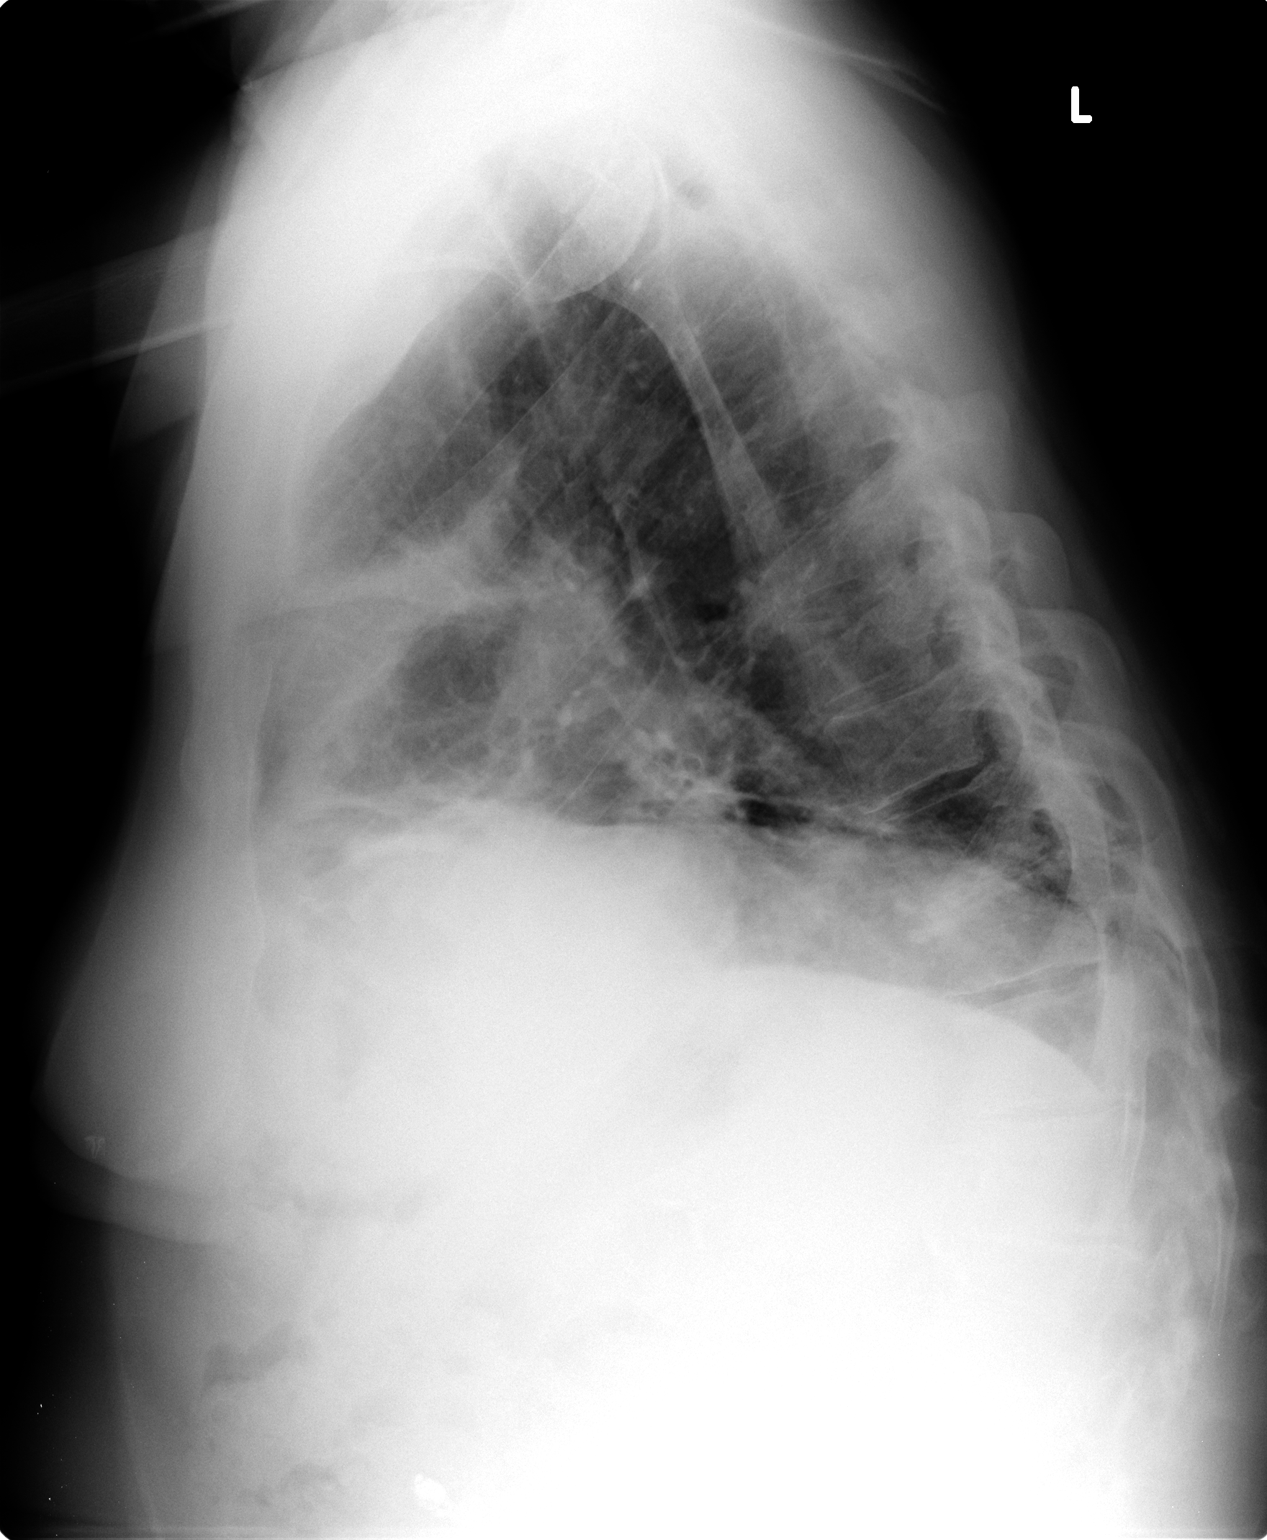

[2 of 2 positions shown; findings below may reference images not displayed]

FINDINGS: There is opacity extend laterally and anteriorly from the left hilum
in the left upper lobe. This has increased from the prior exam.
There is basilar linear and reticular opacity, which is similar to
the prior study. This latter opacity may all be due to combination
scarring and atelectasis.

There is relative lucency in the left upper lobe, more prominent
than on the prior study. Relative decrease markings are also noted
in the right upper lobe near the apex. These findings are consistent
with emphysema.

Cardiac silhouette is normal in size. There are changes from cardiac
surgery and valve replacement. No mediastinal mass is appreciated.
There are no pleural effusions.

Bony thorax is intact.
IMPRESSION: 1. Opacity has developed extending laterally in anteriorly from the
left hilum. Left upper lobe lucency, consistent with emphysema,
appears increased. The possibility of a centrally obstructing mass
must be considered.

Followup chest CT with contrast is recommended.

2. Persistent linear and reticular basilar opacity is likely
combination scarring atelectasis.

## 2015-02-23 ENCOUNTER — Telehealth: Payer: Self-pay | Admitting: *Deleted

## 2015-02-23 DIAGNOSIS — Z85528 Personal history of other malignant neoplasm of kidney: Secondary | ICD-10-CM | POA: Diagnosis not present

## 2015-02-23 DIAGNOSIS — N289 Disorder of kidney and ureter, unspecified: Secondary | ICD-10-CM | POA: Diagnosis not present

## 2015-02-23 NOTE — Telephone Encounter (Signed)
CALLED PATIENT TO INFORM OF FU ON 08-14-15 @ 10:20 AM WITH DR. Isidore Moos, NO ANSWER WILL CALL LATER

## 2015-03-04 DIAGNOSIS — H16211 Exposure keratoconjunctivitis, right eye: Secondary | ICD-10-CM | POA: Diagnosis not present

## 2015-03-04 DIAGNOSIS — H189 Unspecified disorder of cornea: Secondary | ICD-10-CM | POA: Diagnosis not present

## 2015-03-04 DIAGNOSIS — H02102 Unspecified ectropion of right lower eyelid: Secondary | ICD-10-CM | POA: Diagnosis not present

## 2015-03-04 DIAGNOSIS — C770 Secondary and unspecified malignant neoplasm of lymph nodes of head, face and neck: Secondary | ICD-10-CM | POA: Diagnosis not present

## 2015-03-04 DIAGNOSIS — L574 Cutis laxa senilis: Secondary | ICD-10-CM | POA: Diagnosis not present

## 2015-03-04 DIAGNOSIS — G510 Bell's palsy: Secondary | ICD-10-CM | POA: Diagnosis not present

## 2015-03-19 DIAGNOSIS — J441 Chronic obstructive pulmonary disease with (acute) exacerbation: Secondary | ICD-10-CM | POA: Diagnosis not present

## 2015-03-30 DIAGNOSIS — H02102 Unspecified ectropion of right lower eyelid: Secondary | ICD-10-CM | POA: Diagnosis not present

## 2015-03-30 DIAGNOSIS — Z961 Presence of intraocular lens: Secondary | ICD-10-CM | POA: Diagnosis not present

## 2015-03-30 DIAGNOSIS — H04221 Epiphora due to insufficient drainage, right lacrimal gland: Secondary | ICD-10-CM | POA: Diagnosis not present

## 2015-04-01 NOTE — Telephone Encounter (Signed)
Error

## 2015-04-13 ENCOUNTER — Telehealth: Payer: Self-pay | Admitting: *Deleted

## 2015-04-13 NOTE — Telephone Encounter (Signed)
  Oncology Nurse Navigator Documentation   Navigator Encounter Type: Telephone (04/13/15 1143)   Treatment Phase: Other (04/13/15 1143)     Interventions: Coordination of Care (04/13/15 1143)     I returned patient's VMs from this morning.   He reported 2 week onset of:  R neck swelling with soreness  R side of mouth soreness  Intermittent radiating R ear pain He requested follow-up with Dr. Isidore Moos in the next couple of days or if she thought more appropriate, with Dr. [name], [facility]. I indicated I would notify Dr. Isidore Moos, notify him of her guidance. He verbalized understanding.  Gayleen Orem, RN, BSN, [place] at [place] [phone]           Time Spent with Patient: 15 (04/13/15 1143)

## 2015-04-13 NOTE — Telephone Encounter (Signed)
  Oncology Nurse Navigator Documentation   Navigator Encounter Type: Telephone (04/13/15 1245)   Treatment Phase: Other (04/13/15 1245)     Interventions: Coordination of Care (04/13/15 1245)     LVMM for patient indicating Dr. Isidore Moos recommends he see Dr. [name] to address new onset symptoms.  I asked he return my call to confirm message receipt.  Gayleen Orem, RN, BSN, [place] at [place] [phone]           Time Spent with Patient: 15 (04/13/15 1245)

## 2015-04-15 DIAGNOSIS — C770 Secondary and unspecified malignant neoplasm of lymph nodes of head, face and neck: Secondary | ICD-10-CM | POA: Diagnosis not present

## 2015-04-19 DIAGNOSIS — J441 Chronic obstructive pulmonary disease with (acute) exacerbation: Secondary | ICD-10-CM | POA: Diagnosis not present

## 2015-04-20 DIAGNOSIS — H02103 Unspecified ectropion of right eye, unspecified eyelid: Secondary | ICD-10-CM | POA: Insufficient documentation

## 2015-04-21 ENCOUNTER — Telehealth: Payer: Self-pay | Admitting: Cardiology

## 2015-04-21 NOTE — Telephone Encounter (Signed)
Dr [name] completed request to hold aspirin from [facility]. Dr [name] has said may hold aspirin for surgery then restart.  I have given the completed form to medical records to fax.

## 2015-04-21 NOTE — Telephone Encounter (Signed)
New Message    Office calling stating they faxed a surgical clearance form and need the information faxed back asap. Please call back or fax to Fax: [phone].

## 2015-04-22 NOTE — Telephone Encounter (Signed)
Follow up      [facility] did not receive the clearance and stop aspirin request.  Please refax it to [phone].

## 2015-04-22 NOTE — Telephone Encounter (Signed)
Clearance faxed to [facility] at [phone]

## 2015-04-28 DIAGNOSIS — Z9089 Acquired absence of other organs: Secondary | ICD-10-CM | POA: Diagnosis not present

## 2015-04-28 DIAGNOSIS — Z79899 Other long term (current) drug therapy: Secondary | ICD-10-CM | POA: Diagnosis not present

## 2015-04-28 DIAGNOSIS — H04562 Stenosis of left lacrimal punctum: Secondary | ICD-10-CM | POA: Diagnosis not present

## 2015-04-28 DIAGNOSIS — Z7982 Long term (current) use of aspirin: Secondary | ICD-10-CM | POA: Diagnosis not present

## 2015-04-28 DIAGNOSIS — G510 Bell's palsy: Secondary | ICD-10-CM | POA: Diagnosis not present

## 2015-04-28 DIAGNOSIS — H02102 Unspecified ectropion of right lower eyelid: Secondary | ICD-10-CM | POA: Diagnosis not present

## 2015-04-28 DIAGNOSIS — H04201 Unspecified epiphora, right lacrimal gland: Secondary | ICD-10-CM | POA: Diagnosis not present

## 2015-04-28 DIAGNOSIS — H04202 Unspecified epiphora, left lacrimal gland: Secondary | ICD-10-CM | POA: Diagnosis not present

## 2015-04-28 DIAGNOSIS — E785 Hyperlipidemia, unspecified: Secondary | ICD-10-CM | POA: Diagnosis not present

## 2015-04-28 DIAGNOSIS — Z859 Personal history of malignant neoplasm, unspecified: Secondary | ICD-10-CM | POA: Diagnosis not present

## 2015-04-28 DIAGNOSIS — Z8589 Personal history of malignant neoplasm of other organs and systems: Secondary | ICD-10-CM | POA: Diagnosis not present

## 2015-04-28 DIAGNOSIS — Z923 Personal history of irradiation: Secondary | ICD-10-CM | POA: Diagnosis not present

## 2015-04-28 DIAGNOSIS — H0289 Other specified disorders of eyelid: Secondary | ICD-10-CM | POA: Diagnosis not present

## 2015-04-28 DIAGNOSIS — Z9049 Acquired absence of other specified parts of digestive tract: Secondary | ICD-10-CM | POA: Diagnosis not present

## 2015-04-28 DIAGNOSIS — H02105 Unspecified ectropion of left lower eyelid: Secondary | ICD-10-CM | POA: Diagnosis not present

## 2015-04-28 DIAGNOSIS — E039 Hypothyroidism, unspecified: Secondary | ICD-10-CM | POA: Diagnosis not present

## 2015-04-28 DIAGNOSIS — Z800 Family history of malignant neoplasm of digestive organs: Secondary | ICD-10-CM | POA: Diagnosis not present

## 2015-04-28 DIAGNOSIS — H04561 Stenosis of right lacrimal punctum: Secondary | ICD-10-CM | POA: Diagnosis not present

## 2015-04-28 DIAGNOSIS — H02103 Unspecified ectropion of right eye, unspecified eyelid: Secondary | ICD-10-CM | POA: Diagnosis not present

## 2015-05-02 DIAGNOSIS — H04301 Unspecified dacryocystitis of right lacrimal passage: Secondary | ICD-10-CM | POA: Diagnosis not present

## 2015-05-02 DIAGNOSIS — E039 Hypothyroidism, unspecified: Secondary | ICD-10-CM | POA: Diagnosis not present

## 2015-05-04 DIAGNOSIS — Z961 Presence of intraocular lens: Secondary | ICD-10-CM | POA: Diagnosis not present

## 2015-05-04 DIAGNOSIS — H04321 Acute dacryocystitis of right lacrimal passage: Secondary | ICD-10-CM | POA: Diagnosis not present

## 2015-05-04 DIAGNOSIS — G510 Bell's palsy: Secondary | ICD-10-CM | POA: Diagnosis not present

## 2015-05-11 DIAGNOSIS — Z1389 Encounter for screening for other disorder: Secondary | ICD-10-CM | POA: Diagnosis not present

## 2015-05-11 DIAGNOSIS — I480 Paroxysmal atrial fibrillation: Secondary | ICD-10-CM | POA: Diagnosis not present

## 2015-05-11 DIAGNOSIS — M7062 Trochanteric bursitis, left hip: Secondary | ICD-10-CM | POA: Diagnosis not present

## 2015-05-11 DIAGNOSIS — I10 Essential (primary) hypertension: Secondary | ICD-10-CM | POA: Diagnosis not present

## 2015-05-11 DIAGNOSIS — J45998 Other asthma: Secondary | ICD-10-CM | POA: Diagnosis not present

## 2015-05-11 DIAGNOSIS — F418 Other specified anxiety disorders: Secondary | ICD-10-CM | POA: Diagnosis not present

## 2015-05-12 DIAGNOSIS — H04332 Acute lacrimal canaliculitis of left lacrimal passage: Secondary | ICD-10-CM | POA: Diagnosis not present

## 2015-05-12 DIAGNOSIS — Z9889 Other specified postprocedural states: Secondary | ICD-10-CM | POA: Diagnosis not present

## 2015-05-12 DIAGNOSIS — G510 Bell's palsy: Secondary | ICD-10-CM | POA: Diagnosis not present

## 2015-05-12 DIAGNOSIS — E039 Hypothyroidism, unspecified: Secondary | ICD-10-CM | POA: Diagnosis not present

## 2015-05-12 DIAGNOSIS — Z961 Presence of intraocular lens: Secondary | ICD-10-CM | POA: Diagnosis not present

## 2015-05-12 DIAGNOSIS — H02103 Unspecified ectropion of right eye, unspecified eyelid: Secondary | ICD-10-CM | POA: Diagnosis not present

## 2015-05-12 DIAGNOSIS — H18891 Other specified disorders of cornea, right eye: Secondary | ICD-10-CM | POA: Diagnosis not present

## 2015-05-12 DIAGNOSIS — Z79899 Other long term (current) drug therapy: Secondary | ICD-10-CM | POA: Diagnosis not present

## 2015-05-19 DIAGNOSIS — J441 Chronic obstructive pulmonary disease with (acute) exacerbation: Secondary | ICD-10-CM | POA: Diagnosis not present

## 2015-05-20 DIAGNOSIS — Z9089 Acquired absence of other organs: Secondary | ICD-10-CM | POA: Diagnosis not present

## 2015-05-20 DIAGNOSIS — H04321 Acute dacryocystitis of right lacrimal passage: Secondary | ICD-10-CM | POA: Diagnosis not present

## 2015-05-20 DIAGNOSIS — Z08 Encounter for follow-up examination after completed treatment for malignant neoplasm: Secondary | ICD-10-CM | POA: Diagnosis not present

## 2015-05-20 DIAGNOSIS — G510 Bell's palsy: Secondary | ICD-10-CM | POA: Diagnosis not present

## 2015-05-20 DIAGNOSIS — E039 Hypothyroidism, unspecified: Secondary | ICD-10-CM | POA: Diagnosis not present

## 2015-05-20 DIAGNOSIS — Z85828 Personal history of other malignant neoplasm of skin: Secondary | ICD-10-CM | POA: Diagnosis not present

## 2015-05-20 DIAGNOSIS — H02103 Unspecified ectropion of right eye, unspecified eyelid: Secondary | ICD-10-CM | POA: Diagnosis not present

## 2015-05-20 DIAGNOSIS — C770 Secondary and unspecified malignant neoplasm of lymph nodes of head, face and neck: Secondary | ICD-10-CM | POA: Diagnosis not present

## 2015-05-20 DIAGNOSIS — Z923 Personal history of irradiation: Secondary | ICD-10-CM | POA: Diagnosis not present

## 2015-05-27 DIAGNOSIS — E038 Other specified hypothyroidism: Secondary | ICD-10-CM | POA: Diagnosis not present

## 2015-05-27 DIAGNOSIS — Z1389 Encounter for screening for other disorder: Secondary | ICD-10-CM | POA: Diagnosis not present

## 2015-05-27 DIAGNOSIS — K219 Gastro-esophageal reflux disease without esophagitis: Secondary | ICD-10-CM | POA: Diagnosis not present

## 2015-05-27 DIAGNOSIS — E784 Other hyperlipidemia: Secondary | ICD-10-CM | POA: Diagnosis not present

## 2015-05-27 DIAGNOSIS — I10 Essential (primary) hypertension: Secondary | ICD-10-CM | POA: Diagnosis not present

## 2015-05-27 DIAGNOSIS — R0609 Other forms of dyspnea: Secondary | ICD-10-CM | POA: Diagnosis not present

## 2015-06-23 ENCOUNTER — Other Ambulatory Visit: Payer: Self-pay | Admitting: Cardiology

## 2015-06-23 NOTE — Telephone Encounter (Signed)
Medication   metoprolol succinate (TOPROL-XL) 25 MG 24 hr tablet [[id number]]       metoprolol succinate (TOPROL-XL) 25 MG 24 hr tablet [id number] DISCONTINUED     Order Details    Dose: 25 mg Route: Oral Frequency: Daily   Dispense Quantity:  -- Refills:  -- Fills Remaining:  --          Sig: Take 25 mg by mouth daily.         Discontinue Date:  10/29/2014 [place]:  Orson Eva, MD Discontinue Reason:  Stop Taking at Discharge   Written Date:  -- Expiration Date:  -- Ordering Date:  03/03/14    Start Date:  -- End Date:  10/29/14     Ordering Provider:  -- Authorizing Provider:  Historical Provider, MD Ordering User:  Sandrea Hammond, [place]

## 2015-08-11 NOTE — Progress Notes (Signed)
Adrian West presents for follow up of radiation completed 07/22/2014  Pain issues, if any: He reports pain that comes and goes to his Right Ear (Surgery site). He states it is a 6/10. He said the pain comes quickly and will go away just as quickly.  He also reports an occasional headache which is relieved with Ibuprofen.  Using a feeding tube?: No Weight changes, if any:  Wt Readings from Last 3 Encounters:  08/14/15 162 lb (73.483 kg)  02/13/15 155 lb 6.4 oz (70.489 kg)  12/01/14 155 lb 6.4 oz (70.489 kg)   Swallowing issues, if any: He reports soreness when swallowing. He reports he is able to eat what he wants without difficulty.  Smoking or chewing tobacco? No Using fluoride trays daily? He is using the fluoride trays every morning.  Last ENT visit was on: Dr. [name] 05/20/15, He has an appointment again with Dr. [name] in 30 days.  Other notable issues, if any: He reports a decreased appetite over the last few months. He reports he is never hungry.  He also reports a decrease in his energy level, and sleeps at home, quite a bit, with multiple naps during the day. He also complains of vertigo at times, and hearing difficulties.   BP 134/77 mmHg  Pulse 73  Temp(Src) 97.3 F (36.3 C)  Wt 162 lb (73.483 kg)  SpO2 98%

## 2015-08-14 ENCOUNTER — Telehealth: Payer: Self-pay | Admitting: *Deleted

## 2015-08-14 ENCOUNTER — Encounter: Payer: Self-pay | Admitting: Radiation Oncology

## 2015-08-14 ENCOUNTER — Ambulatory Visit
Admission: RE | Admit: 2015-08-14 | Discharge: 2015-08-14 | Disposition: A | Discharge status: Home / Self Care | Payer: Medicare Other | Source: Ambulatory Visit | Attending: Radiation Oncology | Admitting: Radiation Oncology

## 2015-08-14 VITALS — BP 134/77 | HR 73 | Temp 97.3°F | Wt 162.0 lb

## 2015-08-14 DIAGNOSIS — C770 Secondary and unspecified malignant neoplasm of lymph nodes of head, face and neck: Secondary | ICD-10-CM

## 2015-08-14 NOTE — Telephone Encounter (Signed)
CALLED PATIENT TO INFORM OF FU, LVM FOR A RETURN CALL

## 2015-08-14 NOTE — Progress Notes (Signed)
Radiation Oncology         ([phone]) [phone] ________________________________  Name: Adrian West MRN: [id number]  Date: 08/14/2015  DOB: 12/25/1927  Follow-Up Visit Note  CC: No primary care provider on file.  [name], MD    ICD-9-CM ICD-10-CM   1. 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 He reports that he is able to eat what he wants without difficulty. He reports a decreased appetite over the last few months. He reports that he is never hungry. He also reports a decrease in his energy level, and sleeps at home, quite a bit, with multiple naps during the day.   He also complains of vertigo at times, and hearing difficulties. His vertigo is a chronic issue that has been going on for a decade.   The noted hearing loss is in the left ear. 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 He reports pain that comes and goes to his right ear (surgery site). He states that it is a 6/10. He reports that the pain comes quickly and will go away just as quickly. He also reports an occasional headache which is relieved with Ibuprofen. He is not using a feeding tube.   Mr. Howle last imaging from [facility] was a report of a CT of the neck and thyroid with contrast in August. This showed no evidence of  residual or recurrent tumor.   When he saw Dr. [name] (ENT) on 05/19/25, he had no evidence of disease on exam. He has an appointment again with Dr. [name] in 30 days.   At [facility], Dr. Toy Cookey performed some further surgery for his Ectropion, picking up from where Dr. Brayton Caves left off.   The patient is not smoking or chewing tobacco. He is using the fluoride trays every morning.  Mild difficulty with swallowing, but this is stable.  ALLERGIES:  has No Known Allergies.  Meds: Current Outpatient Prescriptions  Medication Sig Dispense Refill  . aspirin EC 81 MG tablet Take 81 mg by mouth daily.    Marland Kitchen atorvastatin (LIPITOR) 10 MG tablet TAKE ONE TABLET EACH DAY 30 tablet 6  . levothyroxine (SYNTHROID, LEVOTHROID) 125 MCG tablet Take 1 tablet (125 mcg total) by mouth daily. 90 tablet 1   No current facility-administered medications for this encounter.    Physical Findings: The patient is in no acute distress. Patient is alert and oriented.  No palpable lymphadenopathy noted in the in preauricular, postauricular, cervical, or supraclavicular regions.   Extraocular movements are intact.  Decreased sensation over the right cheek, right jaw   Oral cavity is moist and without lesions.     Wt Readings from Last 3 Encounters:  08/14/15 162 lb (73.483 kg)  02/13/15 155 lb 6.4 oz (70.489 kg)  12/01/14 155 lb 6.4 oz (70.489 kg)  weight is 162 lb (73.483 kg). His temperature is 97.3 F (36.3 C). His blood pressure is 134/77 and his pulse is 73. His oxygen saturation is 98%. .     Lab Findings: Lab Results  Component Value Date   WBC 4.3 10/29/2014   HGB 11.7* 10/29/2014   HCT 35.6* 10/29/2014   MCV 89.7 10/29/2014   PLT 175 10/29/2014   CMP Latest Ref Rng 10/29/2014 10/28/2014 10/27/2014  Glucose 65 - 99 mg/dL 91 101(H) 70  BUN 6 - 20 mg/dL 15 15 17   Creatinine 0.61 - 1.24 mg/dL 0.67 0.74 0.80  Sodium 135 - 145 mmol/L 137 137 135  Potassium 3.5 - 5.1 mmol/L 4.0 4.2 4.7    Chloride 101 - 111 mmol/L 106 105 105  CO2 22 - 32 mmol/L 24 25 -  Calcium 8.9 - 10.3 mg/dL 8.8(L) 8.8(L) -  Total Protein 6.5 - 8.1 g/dL - - -  Total Bilirubin 0.3 - 1.2 mg/dL - - -  Alkaline Phos 38 - 126 U/L - - -  AST 15 - 41 U/L - - -  ALT 17 - 63 U/L - - -    Lab Results  Component Value Date   TSH 4.448 10/27/2014    Radiographic Findings:  as above  Impression/Plan: Adrian West is a 82 year old male presenting to clinic in regards to his Squamous cell carcinoma of skin of head. He is recovering from the effects of radiation and sugery and managing his symptoms via healthy methods. We will schedule the patient to meet with Elzie Rings in survivorship in 1-2 months. I will follow up with him in 5 months.   1) Head and Neck Cancer Status:  NED  2) Nutritional Status: Patient reports an extremely poor appetite. But weight is improved.  I advised the patient to speak with Elzie Rings in survivorship about this issue, and to also speak to his chef about making hearty soups and seafood, which is does well with, and finding  other ways to increase nutritious intake.   3) Risk Factors: patient knows to use good "sun hygiene"   4) Swallowing: has been seen by SLP and advised to continue with current diet as tolerated.  5) Dental: Patient is compliant with fluoride rinses  6) Thyroid function: preexisting condition of hypothyroidism   - on medication. Not compliant with taking medication over past week.  Advised to take levothyroxine every morning on empty stomach. This may help his energy.  Lab Results  Component Value Date   TSH 4.448 10/27/2014    7) Social: no issues   8) Other: I will ask Dr [name] if he wants to get an MRI to work up shooting pains in  R ear going on for at least a few months.    We will schedule the patient to meet with Elzie Rings in survivorship in 1-2 months. I will follow up with him in 5 months.    _____________________________________   [name], MD  This document serves as a record of services personally performed by [name], MD. It was created on her behalf by Jenell Milliner, a trained medical scribe. The creation of this record is based on the scribe's personal observations and the provider's statements to them. This document has been checked and approved by the attending provider

## 2015-08-17 ENCOUNTER — Telehealth: Payer: Self-pay | Admitting: *Deleted

## 2015-08-17 NOTE — Telephone Encounter (Signed)
Received a phone call from Adrian West and he wants to move his fu appt. From 01-15-16 to 02-12-16 @ 11 a.m., appt. Has been moved to 02-12-16 @ 11 am per patient request

## 2015-08-20 ENCOUNTER — Telehealth: Payer: Self-pay | Admitting: Adult Health

## 2015-08-20 NOTE — Telephone Encounter (Signed)
Error

## 2015-08-20 NOTE — Telephone Encounter (Signed)
I attempted to reach Mr. Dichter to schedule his survivorship visit with me, at the request of Dr. Isidore Moos.  I left a voicemail letting Mr. Height know that I would mail him a copy of his appt calendar and he could call me with any questions, concerns, or if he needed to reschedule the appt.    He will see me on Monday, 09/28/15 at 11 am.  I have mailed this appt to him, along with a letter and my business card.  I look forward to participating in his care!   Mike Craze, NP [place] [phone]

## 2015-08-20 NOTE — Telephone Encounter (Signed)
[name] returned my call and asked that I see him sooner than April, as he is very concerned about his appetite, hearing loss, ear pain, and other concerns.    I had previously scheduled him to see me in late April, based on recommendations by Dr. Isidore Moos. However, the patient wishes to come in as soon as possible to address his concerns.  I let him know that I would try to include Adrian West, Adrian West in our conversations, as one of his biggest concerns is his lack of appetite.  He tells me that he is gaining weight, which is encouraging.  He is also struggling with vertigo. When asked if his ENT physician at [facility] had helped work-up/address his vertigo, he stated that they had not. I explained that often vertigo is the result of inner ear dysfunction and he stated "well my doctors at [facility] don't seem to think that is my problem."    I let [name] know that I would be happy to accommodate moving his appt up to see me to early next week.  I have rescheduled his appt to Monday, 08/24/15 at 9am.  I gave the patient instructions on where to check in for the appt.  He voiced understanding and appreciation.  I will see him on Monday and asked that he ignore the mailing that will be coming to his home with the appt scheduled for April.   Mike Craze, NP [place] [phone]

## 2015-08-24 ENCOUNTER — Encounter: Payer: Self-pay | Admitting: Adult Health

## 2015-08-24 ENCOUNTER — Ambulatory Visit (HOSPITAL_BASED_OUTPATIENT_CLINIC_OR_DEPARTMENT_OTHER): Payer: Medicare Other | Admitting: Adult Health

## 2015-08-24 VITALS — BP 127/73 | HR 68 | Temp 97.4°F | Resp 16 | Wt 164.4 lb

## 2015-08-24 DIAGNOSIS — C770 Secondary and unspecified malignant neoplasm of lymph nodes of head, face and neck: Secondary | ICD-10-CM

## 2015-08-24 DIAGNOSIS — E039 Hypothyroidism, unspecified: Secondary | ICD-10-CM

## 2015-08-24 NOTE — Patient Instructions (Signed)
Thank you for coming in today!  I really enjoyed meeting you.   Here are a few things I would recommend to try to help with your reported symptoms.   *For your dizziness, try to increase the amount of fluids you drink per day to 8-10 glasses.  Drink more non-caffeinated fluids (water, juices, Boost/Ensure, sports drinks, caffeine-free sodas/tea, etc.)   *For your fatigue, keep up the great work with your exercise!  This may also help stimulate your appetite as well.  The fatigue may be related to sub-optimal control of your hypothyroidism.  *For your hypothyroidism, take the levothyroxine (Synthroid) first thing in the morning with water about 1 hour before breakfast.  Do not take the medicine with food or other medicines.  *For your appetite, let's see how you do with increasing your fluids and exercise and after adjustments are made in optimizing your thyroid.  We may consider starting Remeron if your symptoms don't improve, but this medication has risks which we discussed today.  *For your dry mouth, try placing a humidifier beside your bed to help moisten the air in your room.  Continue to use the Biotene products.   I will touch base with you in about 1 month via phone to see how you are doing.  Please call me with any questions before that time!  Mike Craze, NP [phone]

## 2015-08-24 NOTE — Progress Notes (Signed)
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 Path revealed well-differentiated SCC with dense inflammatory cell infiltrate.    2013 Surgery MOHS surgery x 2 for SCC of postauricular skin.    01/10/2014 Miscellaneous Presented to [facility] with new (R) facial paralysis.    01/15/2014 Imaging MRI Brain: Findings consistent with (R) facial nerve Bell's palsy. Stable chronic intracranial changes without acute stroke or mass lesion.    03/07/2014 Imaging MRI Brain: Continued (R) facial nerve enhancement, concern for retrograde perineural tumor spread; Suspicious retromandibular infratemporal area concerning for tumor that could represent deep extension from SCC vs. (R) parotid tumor.    03/10/2014 Imaging CT neck: 16 mm soft tissue mass posterior to mandibular condyle, worrisome for recurrent carcinoma. Given MRI findings, likely perineural spread of tumor along facial nerve of descending temporal segment; fatty cleft in (R) parotid gland appears benign.    03/31/2014 Procedure CT-guided needle biopsy fo deep right facial mass deep to parotid gland to mandibular ramus done. Path showed atypia/fibrosis; immunohistochemistry showed suspicion for poorly diff. SCC.    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 Non-pathologically enlarged bilat cervical nodes; no evidence of distant metastases.  Small hypermetabolic focus in splenic flexure.    04/25/2014 Surgery Surgery at [facility]): (R) partial auriculectomy; (R) total parotidectomy w/ dissection & sacrifice of the facial nerve; (R) mastoidectomy w/ dissection & sacrifice of the facial nerve; (R) selective neck  dissection.    04/25/2014 Surgery Surgery at [facility]): (R) direct mid-forehead brow lift; (R) tarsal strip ectropion repair; (R) static sling using AlloDerm graft for elevation of the nasolabial fold; (R) lateral lip and closure of pre-existing parotidectomy using flap.    05/23/2014 Surgery Surgery done at [facility]): Placement of right-sided gold weight to upper eyelid.    06/11/2014 - 07/22/2014 Radiation Therapy ([place])-XRT Right parotid bed, posterior auricular, and right upper neck. Pt stopped treatment prematurely after 29 tx d/t side effects. Total dose received: 58 Gy to positive margins; 52.72 Gy to remaining at-risk tissue.    10/22/2014 Imaging CT Neck: Interval postsurgical changes of a (R) parotidectomy, selective lymph node dissection, and partial mastoidectomy for squamous cell carcinoma. No evidence of residual or recurrent disease. Stable postsurgical changes involving maxillary sinuses   01/27/2015 Imaging CT Neck: Stable postsurgical changes of (R) parotidectomy, partial auriculectomy, partial mastoidectomy, & (R) neck dissection with no evidence of local residual or recurrent tumor. Slight interval enlargement of (L) submandibular LN, 10 mm, more rounded.   04/28/2015 Surgery [facility]): Medial canthal tendon tightening & Punctoplasty for (R) Epiphora with eyelid laxity, right lower eyelid & Punctal stenosis, right lower eyelid       INTERVAL HISTORY:  Mr. Moskwa reports to survivorship clinic today with chronic complaints; the patient was initially referred to survivorship by Dr. Isidore Moos, who asked that the patient be seen sometime in April/May, but the patient elected to come in sooner to have his concerns addressed more quickly.   His main concerns are as follows:  -Dizziness: Has been a longstanding problem for him for many years.  He was told by his ENT physician that it was "not due to an inner ear problem."  He has  tried Meclizine and Bonine in the past, with  minimal effectiveness; he feels like his symptoms have gotten worse in the past 2 years.  Symptoms occur intermittently and are worse when he moves from a seated to standing position.  -Fatigue: He does not sleep well at night; gets up to urinate every 2 hours; takes at least 2 1-hour naps per day and does not like that he sleeps so much.  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    Pain: Endorses periodic pain to tongue, mouth, throat, jaws, and neck  Nutritional Status:  -Intake/Diet: Regular as tolerated; he enjoys soups, seafood, and salads.  -Using a feeding tube? No -Weight change: GAIN ~ 2 lbs since 08/14/15  Swallowing:  "Okay; I use a straw a lot which helps"   Last ENT visit: 05/20/15 [name]): No clinical evidence of recurrence; RTC in 4 months; will get scan sometime in 10/2015  Last Dentist visit:  Using fluoride daily; seeing dentist 3-4 times per year.   Summary of last Rad Onc visit:  08/14/15 Isidore Moos): NED; see survivorship in 1-2 months to help with QOL concerns; RTC in 5 months.   Last TSH value: TSH 4.448 on 10/27/14  Last Imaging:  CT neck (01/27/15)-Stable postsurgical changes of (R) parotidectomy, partial auriculectomy, partial mastoidectomy, & (R) neck dissection with no evidence of local residual or recurrent tumor. Slight interval enlargement of (L) submandibular LN, 10 mm, more rounded.    ADDITIONAL REVIEW OF SYSTEMS:  Review of Systems  Constitutional: Positive for  malaise/fatigue. Negative for fever.  HENT: Positive for hearing loss and sore throat.        (+) dry mouth  Eyes:       (R) eye epiphora  Respiratory: Negative for cough.   Cardiovascular: Negative for leg swelling.  Gastrointestinal: Negative for nausea, vomiting, abdominal pain, diarrhea and constipation.  Genitourinary: Negative for dysuria.       (+) Nocturia every 2 hours.   Neurological: Positive for dizziness.       Worse when moving from seated to standing position  Psychiatric/Behavioral: Negative for depression.     CURRENT MEDICATIONS:  Outpatient Encounter Prescriptions as of 08/24/2015  Medication Sig  . potassium chloride (K-DUR) 10 MEQ tablet Take 10 mEq by mouth daily.  Marland Kitchen aspirin EC 81 MG tablet Take 81 mg by mouth daily.  Marland Kitchen atorvastatin (LIPITOR) 10 MG tablet TAKE ONE TABLET EACH DAY  . levothyroxine (SYNTHROID, LEVOTHROID) 125 MCG tablet Take 1 tablet (125 mcg total) by mouth daily.   No facility-administered encounter medications on file as of 08/24/2015.    ALLERGIES:  No Known Allergies   PHYSICAL EXAM:  Filed Vitals:   08/24/15 0910 08/24/15 0915  BP: 140/38 127/73  Pulse: 54 68  Temp: 97.4 F (36.3 C)   Resp: 16   *Orthostatic VS as above.  Weight Date  164 lb 6.4 oz (74.571 kg) 08/24/15  162  lb (73.483 kg) 08/14/15  155 lb 6.4 oz (70.489 kg) 02/13/15  155 lb 6.4 oz (70.489 kg) 12/01/14  158 lb 3.2 oz (71.759 kg) 09/22/14  164 lb 4.8 oz (74.526 kg) 08/15/14  169 lb 9.6 oz (76.93 kg) 07/21/14  171 lb 3.2 oz (77.656 kg) 07/14/14  175 lb 6.4 oz (79.561 kg) 07/09/14  183 lb 8 oz (83.235 kg) 06/30/14  187 lb 6.4 oz (85.004 kg) 06/16/14  185 lb (83.915 kg) Pre-treatment (RT consult date): 05/21/14   General: Chronically-ill appearing gentleman in no acute distress.  Unaccompanied today.  HEENT: Head is atraumatic.  Pupils equal and reactive to light. Right eye with (+) epiphora and mild conjunctival redness. Left conjunctiva clear without exudate.  Sclerae  anicteric. Oral mucosa is pink and dry without lesions. Tongue pink and dry; small area of ?mucositis to mid-posterior tongue. No masses palpated on tongue or floor of mouth.  Oropharynx is pink & dry, without lesions. Portion of right pinna absent s/p surgery.  Left hearing aid in place.  Lymph: No preauricular, postauricular, supraclavicular, or infraclavicular lymphadenopathy noted on palpation.   Neck: No palpable masses. Healed scars to head and neck.  Skin on neck is intact.  Cardiovascular: Normal rate and rhythm. Respiratory: Clear to auscultation bilaterally. Chest expansion symmetric without accessory muscle use. Breathing non-labored.   GI: Abdomen soft and round. No tenderness to palpation. Bowel sounds normoactive.  GU: Deferred.   Neuro: No sensation to right side of face; Right eye ptosis and lagging right eyelid blink, however patient is able to completely close his right eye, it is just more sluggish to close than the left.  Gait unsteady; ambulates with standard cane.  Psych: Normal mood and affect for situation. Extremities: No edema, cyanosis, or clubbing.   Skin: Warm and dry.    LABORATORY DATA:  None at this visit.  DIAGNOSTIC IMAGING:  None at this visit.    ASSESSMENT & PLAN:  Mr. June is a pleasant 82 y.o. 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  He received 29/33 planned radiation treatments and patient elected to stop therapy early d/t side effects; completed treatment on 07/22/14.  Patient presents to survivorship clinic today for survivorship concerns post-completion of treatment.   1. 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 Smoker is clinically without evidence of recurrence on physical exam today.  He will return to clinic to see Dr. Isidore Moos in 02/2016 with TSH re-check at that visit.    2. Dizziness/Mild  orthostatic hypotension: One likely cause of his chronic dizziness is fluid volume deficit, based on his report of dizziness when changing positions and his mild orthostatic hypotension demonstrated today when moving from seated to standing position (SBP decreased from 140 to 127; pulse increase from 54 to 68).  I encouraged him to increase his non-caffeinated fluid intake to 8-10 cups per day.  I gave him some examples of fluids that would "qualify" in this daily goal.  We discussed that this may help improve his symptoms, while also helping address his xerostomia as well.    3. 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 Tolson to get a humidifier for his room to help add moisture to the air, particularly at night while sleeping.  He is continuing to use the Biotene products, which offer him temporary relief.  His tongue is sore and  did have a small patch of mild mucositis, so I encouraged him to start using the salt/baking soda/water rinses to swish & spit to see if this will help improve his symptoms.  I will follow-up with him in 1 month and if his symptoms are still there, I will consider prescribing Nystatin swish+spit for potential oral candidiasis.    4. Intermittent mouth/jaw/tongue pain & sore throat: This is likely related to his extensive surgeries and subsequent radiation therapy.  These symptoms have not worsened and do not require any intervention at present.  He will see his ENT physician, Dr. [name], next month.  It is important for Mr. Menna to mention any new or concerning symptoms to his treating oncology team.    5. Decreased appetite: We discussed normal age-related changes that occur in the elderly population with regards to taste changes and decreased appetite.  His oncology treatment history further complicates his ability to taste and enjoy food.  We discussed the possibility of adding Remeron 15 mg QHS.  However, I am reluctant to start this medication  given his current dizziness, unsteady gait, and high falls risk.  I shared with him the mechanism of action of this medication and also my concerns in prescribing it for him at this time.  If his dizziness improves with increased fluid consumption, then we can re-visit a closely monitored trial of Remeron to help with his appetite.  However, I did provide encouragement that his weight is increasing, which is great news and indicative that he is getting the nutrition he needs at this time.   6. Hypothyroidism: I reinforced previous education Mr. Gohn has received regarding best practices when taking levothyroxine (Synthroid).  I discussed with him the importance of taking the medication first thing in the morning on an empty stomach about 1 hour before breakfast.  I explained the rationale for this based on the medication's pharmacodynamics.  I provided him written instructions for how to take this medication as well.  We will continue to monitor his TSH and make adjustments, as appropriate.  I have ordered a TSH for re-check for his visit with Dr. Isidore Moos in 02/2016, but if he continues to have complaints consistent with hypothyroidism, then I will consider moving this lab visit up and make dose adjustments, as needed, as well.    7.  Fatigue: His complaints of fatigue could be secondary to inadequate thyroid supplementation and subsequent hypothyroidism.  His last TSH was normal in 10/2014, but he endorses that he is not consistent about taking it as directed.  I reinforced the importance of compliance with the Synthroid, as thyroid hormone regulation could be contributing to a variety of his current complaints, including the fatigue.  Mr. Eben is also likely not getting adequate restorative sleep per night based on his reported nocturia, which is likely contributing to his fatigue.  My hope is that once he is compliant with taking his Synthroid, that daytime fatigue may improve.  I also encouraged him to continue  to exercise as he is able, as this may help increase his appetite, improve his fatigue/help him sleep better at night, and promote overall health and wellness.    8. Hearing loss: I encouraged him to continue to see his hearing specialist, as clinically indicated.  I also encouraged him to use some of the "gadgets" he brought in to show me today that help minimize background noise and help him hear better in a conference room type setting with a specific app on  the device.  This is very frustrating for him; I validated his concerns and offered support.    9. Smoking/Drinking alcohol cessation: He reports that he drinks 3-4 glasses of wine per day.  I encouraged him to cut back his alcohol consumption, as cutting back and cessation from both alcohol and tobacco are important risk reduction measures for preventing cancer recurrence in H&N cancer patients.  He voiced understanding.   10. Emotional support/At risk for depression: Mr. Bollmann is able to articulate several things he enjoys doing, which include reading and exercise.  He has been limited by being able to read much because of his eyesight, but he is still hopeful and able to enjoy some books.  He also enjoys being physically active when he is able; he and his wife enjoy using the pool at their home at the [facility].  I do not think he is clinically depressed at this time, but he is certainly at risk based on his current symptoms and concerns.  If he becomes clinically depressed, then adding something like Remeron may be a good option to help with both appetite and depression.  We will continue to monitor for emotional distress, as appropriate.    Dispo:  -I will follow-up with the pt in about 1 month via phone to see how he is feeling.  Will consider closely monitored trial of Remeron if appetite stimulation is still one of his biggest concerns and/or if he begins to lose weight.  -He will see Dr. [name] in 09/2015 per  patient.  -He will see Dr. Isidore Moos in 02/2016 with TSH.   A total of 35 minutes was spent in face-to-face care of this patient, with greater than 50% of that time spent in counseling & care-coordination.    Mike Craze, NP Survivorship Program [place] [phone]   (Coding/Billing notation: This patient is a no charge per Dr. Pablo Ledger, Medical Director of Survivorship; pt has never been seen by medical oncology)

## 2015-09-04 ENCOUNTER — Ambulatory Visit (INDEPENDENT_AMBULATORY_CARE_PROVIDER_SITE_OTHER): Payer: Medicare Other | Admitting: Nurse Practitioner

## 2015-09-04 ENCOUNTER — Telehealth: Payer: Self-pay | Admitting: Cardiology

## 2015-09-04 ENCOUNTER — Encounter: Payer: Self-pay | Admitting: Nurse Practitioner

## 2015-09-04 VITALS — BP 120/64 | HR 59 | Ht 70.0 in | Wt 164.4 lb

## 2015-09-04 DIAGNOSIS — R42 Dizziness and giddiness: Secondary | ICD-10-CM | POA: Diagnosis not present

## 2015-09-04 DIAGNOSIS — I951 Orthostatic hypotension: Secondary | ICD-10-CM | POA: Diagnosis not present

## 2015-09-04 DIAGNOSIS — I493 Ventricular premature depolarization: Secondary | ICD-10-CM

## 2015-09-04 NOTE — Patient Instructions (Addendum)
We will be checking the following labs today - NONE   Medication Instructions:    Continue with your current medicines as you are now taking.     Testing/Procedures To Be Arranged:  N/A  Follow-Up:   See me in 4 to 6 weeks with an EKG    Other Special Instructions:   N/A    If you need a refill on your cardiac medications before your next appointment, please call your pharmacy.   Call the [place] office at [phone] if you have any questions, problems or concerns.
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
## 2015-09-04 NOTE — Telephone Encounter (Signed)
Pt states he saw PA at [facility] yesterday for vertigo/lightheadedness.  Pt states metoprolol and potassium were discontinued yesterday Pt states HR yesterday 48, 94/52, lab checked. Pt states he went to see nurse at [facility] today because of lightheadedness. Pt states HR 32-44 irregular today, BP today 102/60 by nurse today at [facility].  Pt states he has a history of vertigo, pt states for the last 2 or so weeks he has been almost constantly lightheaded.

## 2015-09-04 NOTE — Telephone Encounter (Signed)
Pt requested to see Kathrene Alu today.  Reviewed with Kathrene Alu-- Cecille Rubin will see pt today, pt to be here at 12:45PM.  Pt advised to be here at 12:45PM, pt advised not to drive.  Pt verbalized understanding.

## 2015-09-04 NOTE — Telephone Encounter (Signed)
New message  Pt c/o BP issue:  1. What are your last 5 BP readings? Heart rate is rating from 32-44 Checking manually.  2. Are you having any other symptoms (ex. Dizziness, headache, blurred vision, passed out)? Dizziness.  3. Comments: Seen primary care on 09/03/2015. Pt has stopped metoprolol and potassium. Please called the resident first and then call the nurse back to discuss. Req a same day appt

## 2015-09-18 ENCOUNTER — Telehealth: Payer: Self-pay | Admitting: *Deleted

## 2015-09-18 NOTE — Telephone Encounter (Signed)
lvm moved pt's appointment to May 3 @ 2:00 due to provider on vacation.  Pt will call back if any conflict

## 2015-09-28 ENCOUNTER — Ambulatory Visit: Payer: Self-pay | Admitting: Adult Health

## 2015-09-29 ENCOUNTER — Telehealth: Payer: Self-pay | Admitting: Adult Health

## 2015-09-29 NOTE — Telephone Encounter (Signed)
I briefly spoke with Mr. Benthall to check on him to see how he is doing since our visit about 1 month ago.  He tells me that he has recently seen Dr. [name] at [facility], who is planning on ordering scans soon "to see how my cancer is doing."  He also tells me that his main struggle continues to be issues with his balance and dizziness. He saw his PCP, Dr. [name] recently and they are working on getting him in a "balance study."    He voiced appreciation for my call.  He asked that I share this message with both Gayleen Orem, R-H&N Navigator, as well as Dr. Isidore Moos.  He asked that we "keep a close watch over him" and call to check in on him periodically before he has his follow-up visit with Dr. Isidore Moos in 02/2016.    I encouraged him to call us with any needs as well; he has our direct contact information.    Mike Craze, NP [place] [phone]

## 2015-10-01 NOTE — Telephone Encounter (Signed)
A user error has taken place: charting done on wrong patient and has been corrected.

## 2015-10-02 ENCOUNTER — Ambulatory Visit: Payer: Self-pay | Admitting: Nurse Practitioner

## 2015-10-07 ENCOUNTER — Encounter: Payer: Self-pay | Admitting: Nurse Practitioner

## 2015-10-07 ENCOUNTER — Ambulatory Visit (INDEPENDENT_AMBULATORY_CARE_PROVIDER_SITE_OTHER): Payer: Medicare Other | Admitting: Nurse Practitioner

## 2015-10-07 VITALS — BP 108/56 | HR 72 | Ht 70.0 in | Wt 161.8 lb

## 2015-10-07 DIAGNOSIS — I493 Ventricular premature depolarization: Secondary | ICD-10-CM

## 2015-10-07 DIAGNOSIS — R0602 Shortness of breath: Secondary | ICD-10-CM | POA: Diagnosis not present

## 2015-10-07 MED ORDER — AZITHROMYCIN 250 MG PO TABS: ORAL_TABLET | ORAL | Status: DC
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
## 2015-10-07 NOTE — Patient Instructions (Addendum)
We will be checking the following labs today - NONE   Medication Instructions:    Continue with your current medicines.   I am sending in a prescription for another round of antibiotics - Zpak  - go by the pharmacy and pick up - Take 2 pills tonight and then one daily    Testing/Procedures To Be Arranged:  N/A  Follow-Up:  See me in about 2 months.    Other Special Instructions:   N/A    If you need a refill on your cardiac medications before your next appointment, please call your pharmacy.   Call the [place] office at [phone] if you have any questions, problems or concerns.

## 2015-10-16 ENCOUNTER — Encounter: Payer: Self-pay | Admitting: Nurse Practitioner

## 2015-10-16 NOTE — Patient Instructions (Signed)
We will be checking the following labs today - None   Medication Instructions:    Continue with your current medicines.     Testing/Procedures To Be Arranged:  N/A  Follow-Up:   See me in 4 months.     Other Special Instructions:   N/A    If you need a refill on your cardiac medications before your next appointment, please call your pharmacy.   Call the [place] office at [phone] if you have any questions, problems or concerns.
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
## 2015-10-29 ENCOUNTER — Encounter: Payer: Self-pay | Admitting: Nurse Practitioner

## 2015-11-21 IMAGING — RF DG SWALLOWING FUNCTION
1 series · 1 of 1 positions shown · non-contrast
Comparison: none

[Series 1: run · 1 of 1 slices shown]
[im 1/1]
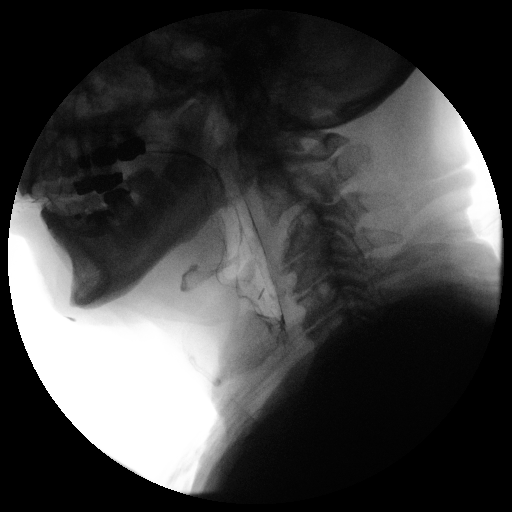

[1 of 1 positions shown; findings below may reference images not displayed]

FLUOROSCOPY FOR SWALLOWING FUNCTION STUDY:
Fluoroscopy was provided for swallowing function study, which was administered by a speech pathologist.  Final results and recommendations from this study are contained within the speech pathology report.

## 2015-11-24 IMAGING — CR DG ABDOMEN 1V
2 series · 2 of 2 positions shown · non-contrast
Comparison: CT scan of the abdomen dated January 10, 2014.

CLINICAL DATA: Evaluate for retained barium in the bowel prior to
gastrostomy tube placement

EXAM:
ABDOMEN - 1 VIEW

[t abdomen supine (1 of 2)]
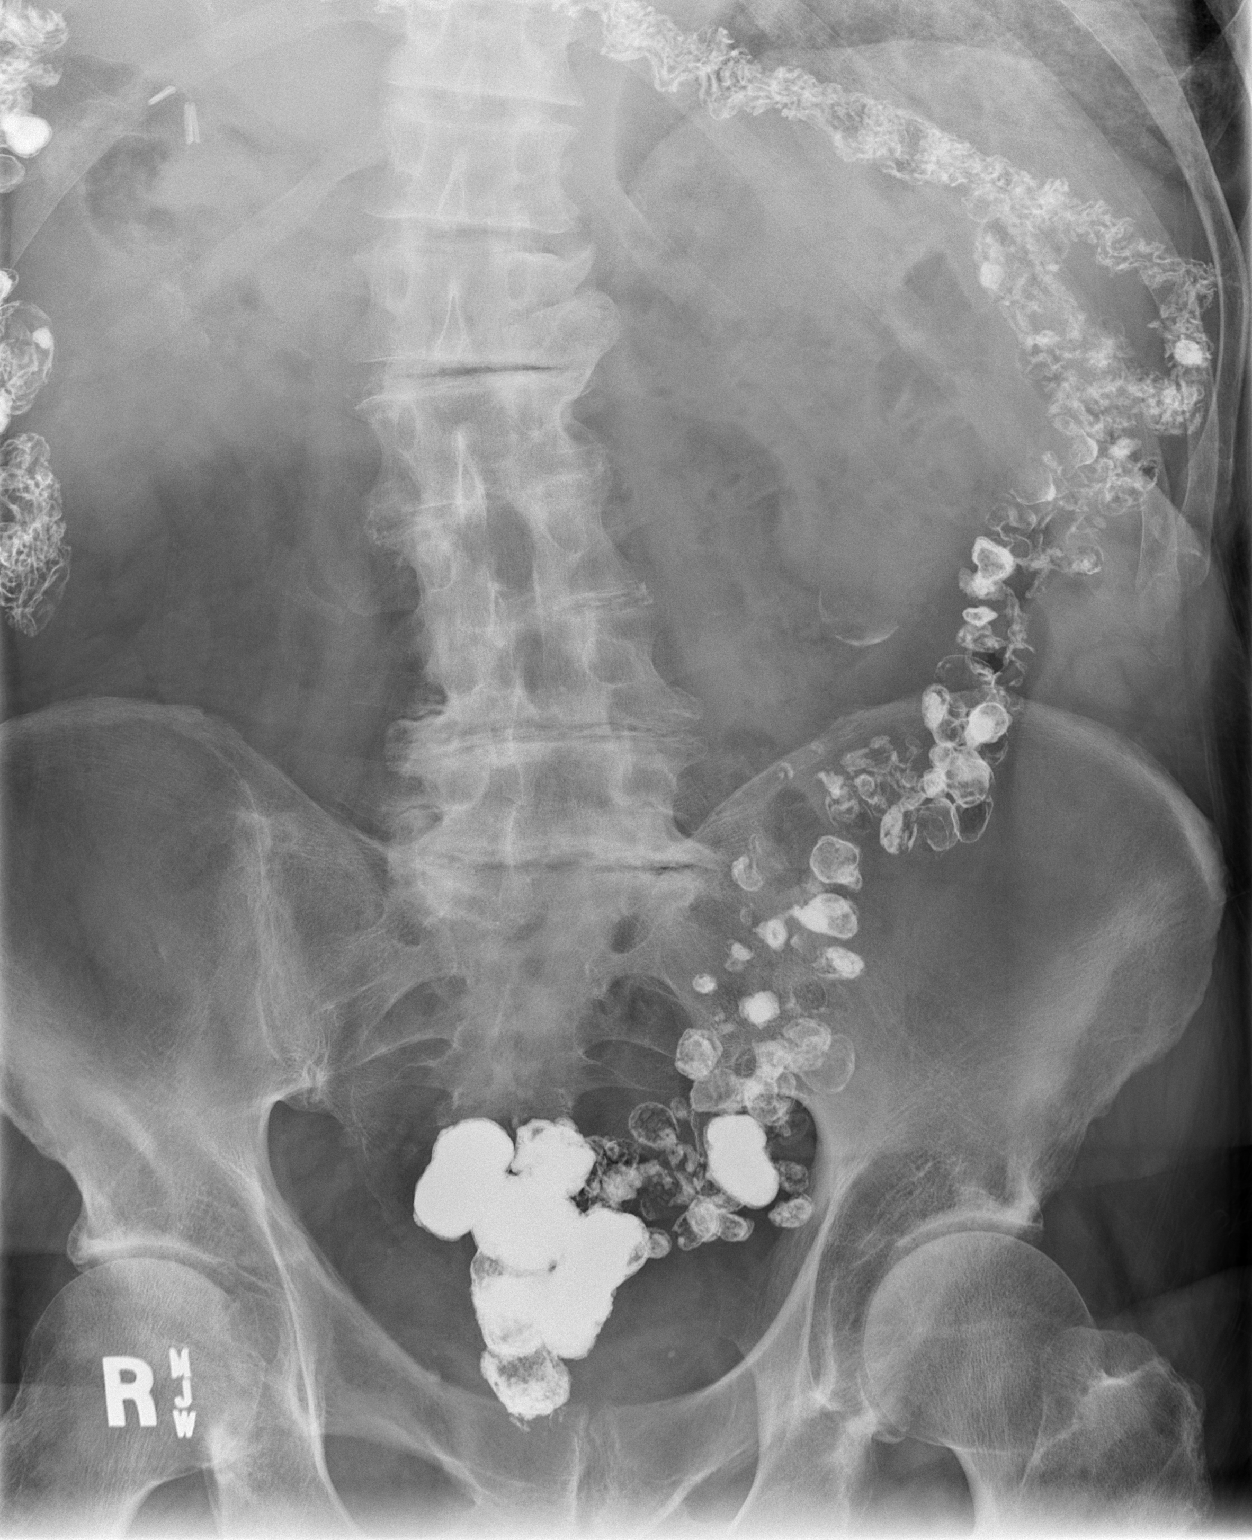

[t abdomen supine (2 of 2)]
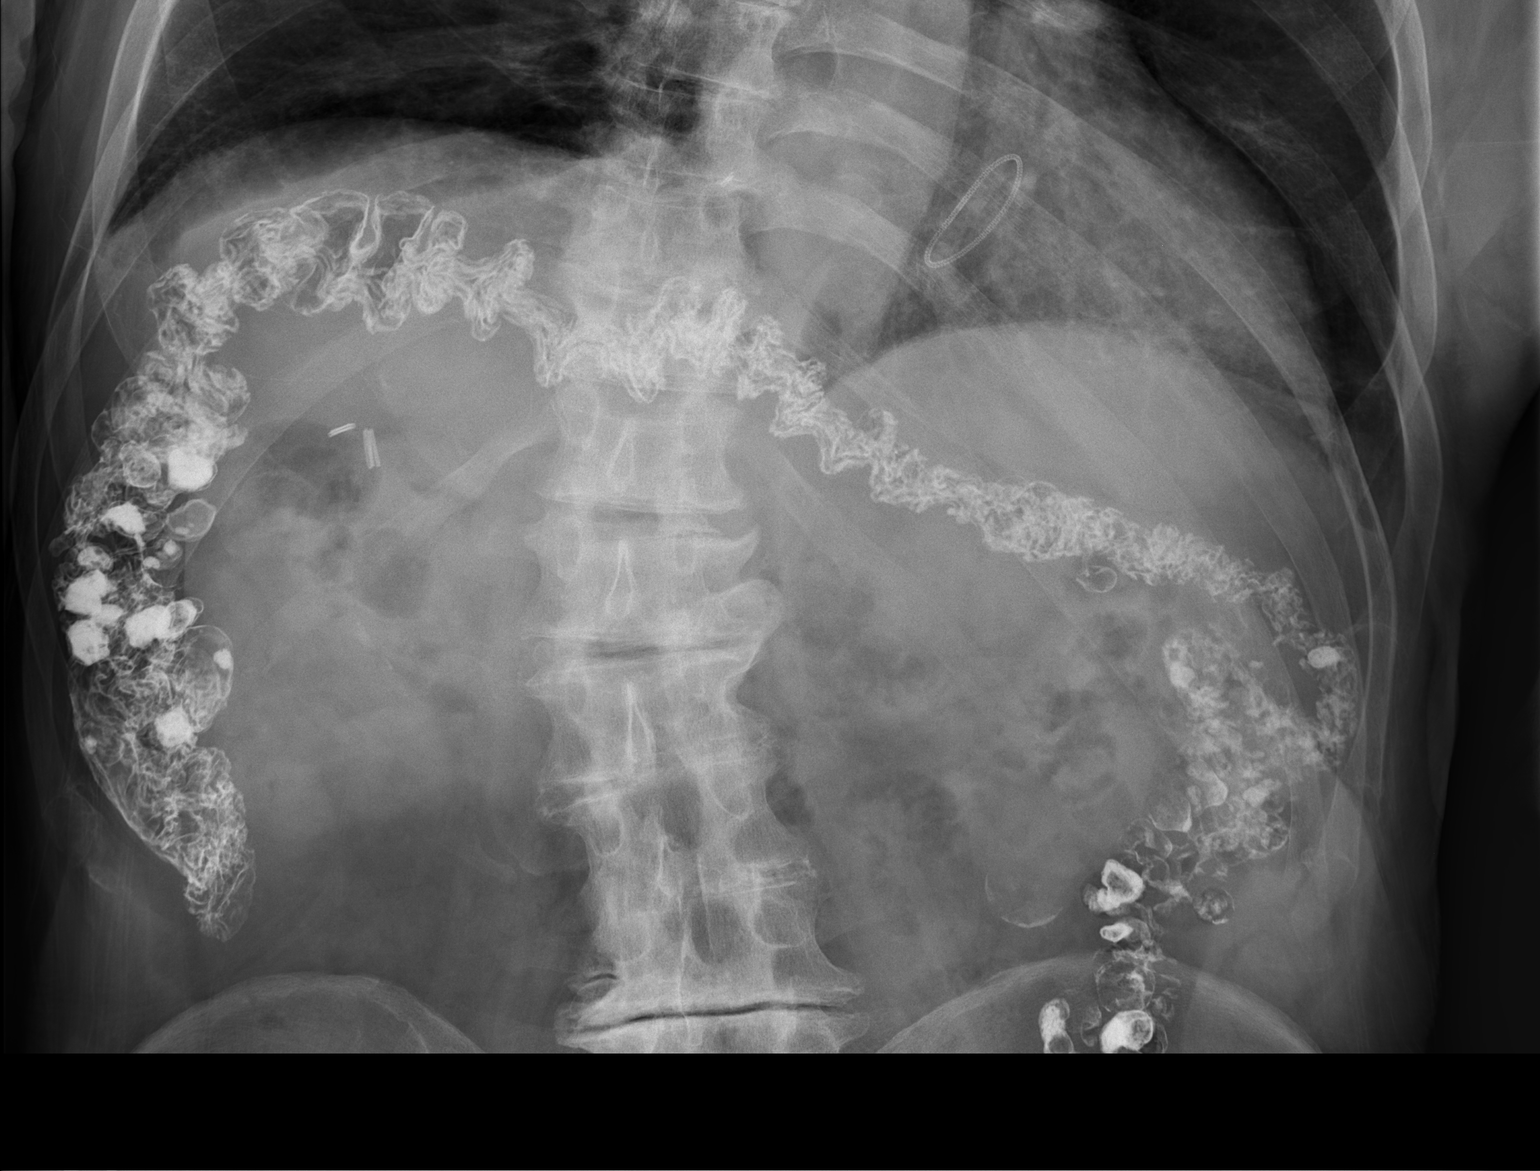

[2 of 2 positions shown; findings below may reference images not displayed]

FINDINGS: There is contrast present throughout the colon. Exuberant
diverticulosis is present in the ascending, descending, and
rectosigmoid portions of the colon. No small bowel or stomach barium
is present. There is multilevel degenerative disc disease of the
lumbar spine with thoracolumbar curvature convex towards the right.
There is curvilinear calcification which corresponds to
calcification within a lower pole cyst in the left kidney on the
previous CT scan.
IMPRESSION: There is retained barium within the colon and evidence of exuberant
diverticulosis.

## 2015-12-22 ENCOUNTER — Encounter: Payer: Self-pay | Admitting: Internal Medicine

## 2015-12-22 ENCOUNTER — Non-Acute Institutional Stay (SKILLED_NURSING_FACILITY): Payer: Medicare Other | Admitting: Internal Medicine

## 2015-12-22 DIAGNOSIS — J320 Chronic maxillary sinusitis: Secondary | ICD-10-CM

## 2015-12-22 DIAGNOSIS — R1310 Dysphagia, unspecified: Secondary | ICD-10-CM

## 2015-12-22 DIAGNOSIS — R531 Weakness: Secondary | ICD-10-CM | POA: Diagnosis not present

## 2015-12-22 DIAGNOSIS — E039 Hypothyroidism, unspecified: Secondary | ICD-10-CM

## 2015-12-22 NOTE — Progress Notes (Deleted)
Patient ID: Adrian West, male   DOB: 06-14-1927, 82 y.o.   MRN: [id number]  Location:    [facility] Room Number: rehab 145 Place of Service:  SNF (31) Provider:  ***  [name], MD  Patient Care Team: [name], MD as PCP - General (Internal Medicine) [name], MD as Attending Physician (Internal Medicine) [name], MD as Consulting Physician (Otolaryngology) [name], MD as Consulting Physician (Ophthalmology) [name], MD as Consulting Physician (Cardiology) [name], DO as Consulting Physician (Neurology) [name], MD as Referring Physician (Otolaryngology) [name], MD as Attending Physician (Radiation Oncology) [name], RN as Oncology Nurse Navigator Brand Males, MD as Consulting Physician (Pulmonary Disease) [name], MD as Referring Physician (Otolaryngology) [name], MD as Consulting Physician (Dermatology)  Extended Emergency Contact Information Primary Emergency Contact: Abe People of [place] Phone: [phone] Mobile Phone: [phone] Relation: Daughter Secondary Emergency Contact: Cantero,[name] Address: [place], [place] Phone: [phone] Relation: Spouse  Code Status:  *** Goals of care: Advanced Directive information Advanced Directives 12/22/2015  Does patient have an advance directive? Yes  Type of Advance Directive Living will;Healthcare Power of Attorney  Copy of advanced directive(s) in chart? Yes     Chief Complaint  Patient presents with  . Rehab admission    admission    HPI:  Pt is a 82 y.o. male seen today for an acute visit for    Past Medical History  Diagnosis Date  . Benign neoplasm of colon   . Tremor, essential   . Diverticulosis   . TIA (transient ischemic attack) May 2012  . Neuropathy, peripheral ([place])   . Lung abnormality 11/25/2008    "nerve damage from heart  surgery"  . MGUS (monoclonal gammopathy of unknown significance) 04/11/2013  . Diaphragm paralysis   . Esophageal stricture   . Hiatal hernia   . Bell's palsy   . Vertigo 2005  . Gait disturbance   . History of radiation therapy 06/11/14-07/22/14    skin cancer of scalp/face  . Anorexia 08/16/2014  . 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 DNR (do not resuscitate) discussion 07/22/2014  . Dyspnea and respiratory abnormality 03/10/2014  . DYSPNEA/SHORTNESS OF BREATH 02/11/2010    R hemidiaphragm paralysis post mitral valve surgery 11/2008 with resultant Class 3-4 dyspnea; restrictive pattern on PFTs in excess of compromise expected with diaphragm injury (Note: ? Interstitial lung disease 10/15/13 Xray [facility]) 01/02/12 new subsegmental ATX on L (vs 09/03/11) Seeing Dr Theda Sers Pulmonary Care  Seen by Dr Corrin Parker, [facility],  02/2011. Component of thoracic height loss & alcohol related neuropathy questioned. No plication recommended  5/4- 10/14/2013 @ [facility] ; Dr  Ellis Parents.10/15/13 new focal consolidation mid anterior L lung:aspiration vs PNA . Underlying interstitial lung disease.10/15/13 Rx sent to [facility] for Doxycycline & Augmentin by Dr Jeannine Kitten ( not picked up as of 10/18/13).  Flew back to [facility]; seen @ 10/16/13 [facility], [place]. Levaquin X 5 days , Dr Evalee Mutton    . GERD 02/14/2008    Qualifier: Diagnosis of  By: [name] MD, William    . HYPERTENSION 02/14/2008        . HYPOTHYROIDISM 02/14/2008  . Loss of weight 07/22/2014  . Memory loss 12/31/2009    Qualifier: Diagnosis of  By: [name] MD, [name]    . MITRAL REGURGITATION 02/14/2008  Qualifier: Diagnosis of  By: [name] MD, Cherlynn Kaiser MV surgery 11/2008, [name].complcated by paralysis of diaphragm     . 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 Other and unspecified hyperlipidemia 02/24/2012  . Polycythemia,  secondary 01/10/2014  . PVC's (premature ventricular contractions) 04/05/2011  . SPINAL STENOSIS, LUMBAR 07/28/2010    Qualifier: Diagnosis of  By: [name] MD, [name], NS ( retired)    . Weakness generalized 07/22/2014  . Interstitial lung disease ([place]) 05/05/2014  . Vertigo 2005  . Deaf   . Tremor 2015  . Squamous carcinoma ([place])   . 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 Skin cancer of scalp or skin of neck 05/21/2014   Past Surgical History  Procedure Laterality Date  . Appendectomy    . Cataract extraction      [name]  . Cholecystectomy  1998    [name]  . Inguinal hernia repair    . Lumbar laminectomy    . Nasal sinus surgery    . Tonsillectomy    . Esophageal dilation       X3;[name]  . 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 Laparoscopic ablation renal mass  08/14/2009  . Mohs surgery  2012     ear  . Tilt table study N/A 05/06/2013    Procedure: TILT TABLE STUDY;  Surgeon: [name], MD;  Location: [facility] CATH LAB;  Service: Cardiovascular;  Laterality: N/A;  . Right heart catheterization N/A 03/03/2014    Procedure: RIGHT HEART CATH;  Surgeon: [name], MD;  Location: [facility] CATH LAB;  Service: Cardiovascular;  Laterality: N/A;  . Partial auriculectomy Right 04/25/2014  . Parotidectomy Right 04/25/2014    Sacrifice of the facial nerve  . Neck dissection Right 04/25/2014  . Brow lift Right 04/25/2014    Midforehead  . Ectropion repair Right 04/25/2014    Right tarsal strip  . Nasolabial repair Right 04/25/2014    Right static sling using AlloDerm graft elevation of the nasolabial fold  . Cardiac valve replacement    . Cardiac catheterization      No Known Allergies    Medication List       This list is accurate as of: 12/22/15 11:23 AM.  Always use your most recent med list.                aspirin EC 81 MG tablet  Take 81 mg by mouth daily.     benzonatate 100 MG capsule  Commonly known as:  TESSALON  Take 100 mg by mouth 3 (three) times daily as needed for cough.     guaiFENesin 600 MG 12 hr tablet  Commonly known as:  MUCINEX  Take 600 mg by mouth 2 (two) times daily as needed for cough.     levofloxacin 750 MG tablet  Commonly known as:  LEVAQUIN  Take 750 mg by mouth daily.     levothyroxine 75 MCG tablet  Commonly known as:  SYNTHROID, LEVOTHROID  Take 75 mcg by mouth daily before breakfast.     loratadine-pseudoephedrine 10-240 MG 24 hr tablet  Commonly known as:  CLARITIN-D 24-hour  Take 1 tablet by mouth daily  as needed.        Review of Systems  Immunization History  Administered Date(s) Administered  . Influenza Split 03/07/2011, 02/24/2012  . Influenza Whole 03/09/2009, 02/11/2010  . Influenza,inj,Quad PF,36+ Mos 02/11/2013, 02/13/2014  . Pneumococcal Polysaccharide-23 05/11/2004, 02/11/2010  . Pneumococcal-Unspecified 04/06/2013  . Td 08/17/2005   Pertinent  Health Maintenance Due  Topic Date Due  . PNA vac Low Risk Adult (2 of 2 - PCV13) 04/06/2014  . INFLUENZA VACCINE  01/05/2016   Fall Risk  08/14/2015 08/18/2014 05/21/2014  Falls in the past year? No No No  Risk for fall due to : - - Impaired balance/gait;Impaired mobility   Functional Status Survey:    Filed Vitals:   12/22/15 1103  BP: 110/70  Pulse: 39  Temp: 97.1 F (36.2 C)  TempSrc: Oral  Resp: 22  Height: 5\' 10"  (1.778 m)  Weight: 147 lb (66.679 kg)  SpO2: 97%   Body mass index is 21.09 kg/(m^2). Physical Exam  Labs reviewed: No results for input(s): NA, K, CL, CO2, GLUCOSE, BUN, CREATININE, CALCIUM, MG, PHOS in the last 8760 hours. No results for input(s): AST, ALT, ALKPHOS, BILITOT, PROT, ALBUMIN in the last 8760 hours. No results for input(s): WBC, NEUTROABS, HGB, HCT, MCV, PLT in the last 8760 hours. Lab Results  Component Value Date   TSH  4.448 10/27/2014   Lab Results  Component Value Date   HGBA1C 5.5 03/06/2013   Lab Results  Component Value Date   CHOL 166 10/22/2013   HDL 37.80* 10/22/2013   LDLCALC 98 10/22/2013   LDLDIRECT 145.2 09/20/2010   TRIG 152.0* 10/22/2013   CHOLHDL 4 10/22/2013    Significant Diagnostic Results in last 30 days:  No results found.  Assessment/Plan There are no diagnoses linked to this encounter.   Family/ staff Communication: ***  Labs/tests ordered:  ***

## 2015-12-22 NOTE — Progress Notes (Signed)
Patient ID: Adrian West, male   DOB: 07-08-1927, 82 y.o.   MRN: [id number]  Provider:  Rexene Edison. Mariea Clonts, D.O., C.M.D. Location:   [place] Room Number: rehab 145 Place of Service:  SNF (31)  PCP: [name], MD Patient Care Team: [name], MD as PCP - General (Internal Medicine) [name], MD as Attending Physician (Internal Medicine) [name], MD as Consulting Physician (Otolaryngology) [name], MD as Consulting Physician (Ophthalmology) [name], MD as Consulting Physician (Cardiology) [name], DO as Consulting Physician (Neurology) [name], MD as Referring Physician (Otolaryngology) [name], MD as Attending Physician (Radiation Oncology) [name], RN as Oncology Nurse Navigator Brand Males, MD as Consulting Physician (Pulmonary Disease) [name], MD as Referring Physician (Otolaryngology) [name], MD as Consulting Physician (Dermatology)  Extended Emergency Contact Information Primary Emergency Contact: Abe People of [place] Phone: [phone] Mobile Phone: [phone] Relation: Daughter Secondary Emergency Contact: Macqueen,[name] Address: [place], [place] of [place] Phone: [phone] Relation: Spouse  Code Status: DNR Goals of Care: Advanced Directive information Advanced Directives 12/22/2015  Does patient have an advance directive? Yes  Type of Advance Directive Living will;Healthcare Power of Attorney  Copy of advanced directive(s) in chart? Yes    Chief Complaint  Patient presents with  . Rehab admission    admission    HPI: Patient is a 82 y.o. male seen today for admission to [facility] rehab.  [place]  He had begun this illness in April and never fully recovered.  He had a bad cough and sob on 7/14 and difficulty standing.  Clinic nurse spoke with pt's daughter  and recommended they return from the mountains to [place] where he could have more help.  He also had fallen twice in the past few weeks and had several bruises on his face and extremities.  He had completed two courses of abx for sinusitis w/o resolution so underwent functional endoscopic sinus surgery (left frontal and maxillary sinusotomies) by Dr. Debbe Mounts, [facility] [place]. At [facility] on 6/29 under general anesthesia.  He returned here to [place] and was admitted to rehab on 7/17 with three remaining days of another course of abx.  He was noted to be bradycardic in the upper 30s to lower 40s.  Review of notes from his PCP (Dr. [name]) indicates that his pulse was this low during his last visit there also.  Today, it is 41.  His daughter had requested a phone call and when we spoke, she wanted to know whether or not her dad actually did have pneumonia when he was in the ED.  Notes read probable pneumonia and the CT and CXR reports were not included in the records we had.  He was given one dose of IV levaquin three and then sent here with 3 more days of levaquin po.    Past Medical History  Diagnosis Date  . Benign neoplasm of colon   . Tremor, essential   . Diverticulosis   . TIA (transient ischemic attack) May 2012  . Neuropathy, peripheral ([place])   . Lung abnormality 11/25/2008    "nerve damage from heart surgery"  . MGUS (monoclonal gammopathy of unknown significance) 04/11/2013  . Diaphragm paralysis   . Esophageal stricture   . Hiatal hernia   . Bell's palsy   . Vertigo 2005  .  Gait disturbance   . History of radiation therapy 06/11/14-07/22/14    skin cancer of scalp/face  . Anorexia 08/16/2014  . 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 DNR (do not resuscitate) discussion 07/22/2014  . Dyspnea and respiratory abnormality 03/10/2014  . DYSPNEA/SHORTNESS OF BREATH 02/11/2010    R hemidiaphragm paralysis post mitral valve surgery 11/2008 with resultant Class 3-4 dyspnea; restrictive  pattern on PFTs in excess of compromise expected with diaphragm injury (Note: ? Interstitial lung disease 10/15/13 Xray [facility]) 01/02/12 new subsegmental ATX on L (vs 09/03/11) Seeing Dr Theda Sers Pulmonary Care  Seen by Dr Corrin Parker, [facility],  02/2011. Component of thoracic height loss & alcohol related neuropathy questioned. No plication recommended  5/4- 10/14/2013 @ [facility] ; Dr  Ellis Parents.10/15/13 new focal consolidation mid anterior L lung:aspiration vs PNA . Underlying interstitial lung disease.10/15/13 Rx sent to [facility] for Doxycycline & Augmentin by Dr Jeannine Kitten ( not picked up as of 10/18/13).  Flew back to [facility]; seen @ 10/16/13 [facility], [place]. Levaquin X 5 days , Dr Evalee Mutton    . GERD 02/14/2008    Qualifier: Diagnosis of  By: [name] MD, William    . HYPERTENSION 02/14/2008        . HYPOTHYROIDISM 02/14/2008  . Loss of weight 07/22/2014  . Memory loss 12/31/2009    Qualifier: Diagnosis of  By: [name] MD, [name]    . MITRAL REGURGITATION 02/14/2008    Qualifier: Diagnosis of  By: [name] MD, Cherlynn Kaiser MV surgery 11/2008, [name].complcated by paralysis of diaphragm     . 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 Other and unspecified hyperlipidemia 02/24/2012  . Polycythemia, secondary 01/10/2014  . PVC's (premature ventricular contractions) 04/05/2011  . SPINAL STENOSIS, LUMBAR 07/28/2010    Qualifier: Diagnosis of  By: [name] MD, [name], NS ( retired)    . Weakness generalized 07/22/2014  . Interstitial lung disease ([place]) 05/05/2014  . Vertigo 2005  . Deaf   . Tremor 2015  . Squamous carcinoma ([place])   . 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 Skin cancer of scalp or skin of neck 05/21/2014   Past Surgical History  Procedure Laterality Date  .  Appendectomy    . Cataract extraction      [name]  . Cholecystectomy  1998    [name]  . Inguinal hernia repair    . Lumbar laminectomy    . Nasal sinus surgery    . Tonsillectomy    . Esophageal dilation       X3;[name]  . 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 Laparoscopic ablation renal mass  08/14/2009  . Mohs surgery  2012     ear  . Tilt table study N/A 05/06/2013    Procedure: TILT TABLE STUDY;  Surgeon: [name], MD;  Location: [facility] CATH LAB;  Service: Cardiovascular;  Laterality: N/A;  . Right heart catheterization N/A 03/03/2014    Procedure: RIGHT HEART CATH;  Surgeon: [name], MD;  Location: [facility] CATH LAB;  Service: Cardiovascular;  Laterality: N/A;  . Partial auriculectomy Right 04/25/2014  . Parotidectomy Right 04/25/2014    Sacrifice of the  facial nerve  . Neck dissection Right 04/25/2014  . Brow lift Right 04/25/2014    Midforehead  . Ectropion repair Right 04/25/2014    Right tarsal strip  . Nasolabial repair Right 04/25/2014    Right static sling using AlloDerm graft elevation of the nasolabial fold  . Cardiac valve replacement    . Cardiac catheterization      reports that he has never smoked. He has never used smokeless tobacco. He reports that he does not drink alcohol or use illicit drugs. Social History   Social History  . Marital Status: Married    Spouse Name: N/A  . Number of Children: N/A  . Years of Education: N/A   Occupational History  . retired Equities trader     Social History Main Topics  . Smoking status: Never Smoker   . Smokeless tobacco: Never Used  . Alcohol Use: No     Comment: Drinks wine 3 per night  . Drug Use: No  . Sexual Activity: Not Currently   Other Topics Concern  . 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   Married x 53 years.  They have three  daughters.    Family History  Problem Relation Age of Onset  . Heart attack Father 41  . Colon cancer Mother 65  . Heart attack Maternal Grandmother 60  . Uterine cancer Paternal Grandmother   . Stroke Neg Hx   . Diabetes Neg Hx   . Healthy Sister     Health Maintenance  Topic Date Due  . ZOSTAVAX  02/20/1990  . PNA vac Low Risk Adult (2 of 2 - PCV13) 04/06/2014  . TETANUS/TDAP  08/18/2015  . INFLUENZA VACCINE  01/05/2016    No Known Allergies    Medication List       This list is accurate as of: 12/22/15 11:51 AM.  Always use your most recent med list.               aspirin EC 81 MG tablet  Take 81 mg by mouth daily.     benzonatate 100 MG capsule  Commonly known as:  TESSALON  Take 100 mg by mouth 3 (three) times daily as needed for cough.     guaiFENesin 600 MG 12 hr tablet  Commonly known as:  MUCINEX  Take 600 mg by mouth 2 (two) times daily as needed for cough.     levofloxacin 750 MG tablet  Commonly known as:  LEVAQUIN  Take 750 mg by mouth daily.     levothyroxine 75 MCG tablet  Commonly known as:  SYNTHROID, LEVOTHROID  Take 75 mcg by mouth daily before breakfast.     loratadine-pseudoephedrine 10-240 MG 24 hr tablet  Commonly known as:  CLARITIN-D 24-hour  Take 1 tablet by mouth daily as needed.        Review of Systems  Constitutional: Positive for malaise/fatigue. Negative for chills and fever.  HENT: Positive for congestion.        Chronic changes in skin of neck from surgery and XRT in the past  Eyes: Negative for blurred vision.       Glasses  Respiratory: Positive for cough and sputum production. Negative for shortness of breath.   Cardiovascular: Negative for chest pain, palpitations and leg swelling.  Gastrointestinal: Negative for abdominal pain, blood in stool, constipation and melena.  Genitourinary: Negative for dysuria.  Musculoskeletal: Positive for falls. Negative  for myalgias.  Neurological: Positive for weakness.  Negative for focal weakness and headaches.  Psychiatric/Behavioral: Negative for memory loss.    Filed Vitals:   12/22/15 1103  BP: 110/70  Pulse: 39  Temp: 97.1 F (36.2 C)  TempSrc: Oral  Resp: 22  Height: 5\' 10"  (1.778 m)  Weight: 147 lb (66.679 kg)  SpO2: 97%   Body mass index is 21.09 kg/(m^2). Physical Exam  Constitutional: He is oriented to person, place, and time. He appears well-developed and well-nourished. No distress.  HENT:  Head: Atraumatic.  Right Ear: External ear normal.  Left Ear: External ear normal.  Nose: Nose normal.  Mouth/Throat: Oropharynx is clear and moist.  Right side of face and neck s/p XRT  Eyes: Pupils are equal, round, and reactive to light.  Cardiovascular: Normal rate, regular rhythm, normal heart sounds and intact distal pulses.   Pulmonary/Chest: Effort normal. No respiratory distress.  Coarse wet rhonchi  Abdominal: Soft. Bowel sounds are normal.  Musculoskeletal: Normal range of motion.  Unsteady, uses cane from [facility]  Neurological: He is alert and oriented to person, place, and time.  Skin: Skin is warm and dry.  XRT changes of head and neck  Psychiatric:  Affect kind of flat, seems down    Labs reviewed: Basic Metabolic Panel: No results for input(s): NA, K, CL, CO2, GLUCOSE, BUN, CREATININE, CALCIUM, MG, PHOS in the last 8760 hours. Liver Function Tests: No results for input(s): AST, ALT, ALKPHOS, BILITOT, PROT, ALBUMIN in the last 8760 hours. No results for input(s): LIPASE, AMYLASE in the last 8760 hours. No results for input(s): AMMONIA in the last 8760 hours. CBC: No results for input(s): WBC, NEUTROABS, HGB, HCT, MCV, PLT in the last 8760 hours. Cardiac Enzymes: No results for input(s): CKTOTAL, CKMB, CKMBINDEX, TROPONINI in the last 8760 hours. BNP: Invalid input(s): POCBNP Lab Results  Component Value Date   HGBA1C 5.5 03/06/2013   Lab Results  Component Value Date   TSH 4.448 10/27/2014   Lab Results    Component Value Date   [id number] 03/06/2013   No results found for: FOLATE No results found for: IRON, TIBC, FERRITIN  Imaging and Procedures obtained prior to SNF admission: Reviewed ENT notes, imaging, and requested CT and CXR from ED  Assessment/Plan 1. Chronic maxillary sinusitis -suspect his symptoms were all related to this and not a true pneumonia, could also have been some bronchitis, but has chronic aspiration from dysphagia due to his previous XRT/surgery for ENT ca  2. Weakness generalized -here for rehab, but refuses PT order with [facility] b/c he [place] that is meant to be weekly and liked it -his daughter seemed agreeable to therapy here, but he'd already refused  3. Dysphagia -ongoing, cont current diet and monitor, keep HOB elevated  4. Hypothyroidism, unspecified hypothyroidism type -cont current synthroid and monitor  Family/ staff Communication: discussed with daughter, [place] ordered:  No new; await CXR and CT chest from ED to know for sure what we are dealing with  Neida Ellegood L. Karenna Romanoff, D.O. [facility] [address], [place] [address] Cell Phone (Mon-Fri 8am-5pm):  [phone] On Call:  [phone] & follow prompts after 5pm & weekends Office Phone:  [phone] Office Fax:  [phone]

## 2015-12-31 ENCOUNTER — Ambulatory Visit: Payer: Self-pay | Admitting: Physician Assistant

## 2015-12-31 ENCOUNTER — Encounter (HOSPITAL_COMMUNITY): Payer: Self-pay

## 2015-12-31 ENCOUNTER — Ambulatory Visit (HOSPITAL_COMMUNITY)
Admission: RE | Admit: 2015-12-31 | Discharge: 2015-12-31 | Disposition: A | Discharge status: Home / Self Care | Payer: Medicare Other | Source: Ambulatory Visit | Attending: Cardiology | Admitting: Cardiology

## 2015-12-31 VITALS — BP 118/70 | HR 66 | Wt 150.5 lb

## 2015-12-31 DIAGNOSIS — E785 Hyperlipidemia, unspecified: Secondary | ICD-10-CM | POA: Diagnosis not present

## 2015-12-31 DIAGNOSIS — I10 Essential (primary) hypertension: Secondary | ICD-10-CM | POA: Insufficient documentation

## 2015-12-31 DIAGNOSIS — I4510 Unspecified right bundle-branch block: Secondary | ICD-10-CM | POA: Diagnosis not present

## 2015-12-31 DIAGNOSIS — J849 Interstitial pulmonary disease, unspecified: Secondary | ICD-10-CM | POA: Insufficient documentation

## 2015-12-31 DIAGNOSIS — R0609 Other forms of dyspnea: Secondary | ICD-10-CM | POA: Diagnosis not present

## 2015-12-31 DIAGNOSIS — R251 Tremor, unspecified: Secondary | ICD-10-CM | POA: Insufficient documentation

## 2015-12-31 DIAGNOSIS — E039 Hypothyroidism, unspecified: Secondary | ICD-10-CM | POA: Diagnosis not present

## 2015-12-31 DIAGNOSIS — I440 Atrioventricular block, first degree: Secondary | ICD-10-CM | POA: Diagnosis not present

## 2015-12-31 DIAGNOSIS — G510 Bell's palsy: Secondary | ICD-10-CM | POA: Diagnosis not present

## 2015-12-31 DIAGNOSIS — Z7982 Long term (current) use of aspirin: Secondary | ICD-10-CM | POA: Insufficient documentation

## 2015-12-31 DIAGNOSIS — I951 Orthostatic hypotension: Secondary | ICD-10-CM | POA: Diagnosis not present

## 2015-12-31 DIAGNOSIS — G8389 Other specified paralytic syndromes: Secondary | ICD-10-CM | POA: Diagnosis not present

## 2015-12-31 DIAGNOSIS — R42 Dizziness and giddiness: Secondary | ICD-10-CM | POA: Diagnosis not present

## 2015-12-31 DIAGNOSIS — I509 Heart failure, unspecified: Secondary | ICD-10-CM | POA: Diagnosis not present

## 2015-12-31 DIAGNOSIS — Z8673 Personal history of transient ischemic attack (TIA), and cerebral infarction without residual deficits: Secondary | ICD-10-CM | POA: Diagnosis not present

## 2015-12-31 LAB — BASIC METABOLIC PANEL
ANION GAP: 9 (ref 5–15)
BUN: 10 mg/dL (ref 6–20)
CO2: 24 mmol/L (ref 22–32)
CREATININE: 0.87 mg/dL (ref 0.61–1.24)
Calcium: 9 mg/dL (ref 8.9–10.3)
Chloride: 104 mmol/L (ref 101–111)
Glucose, Bld: 71 mg/dL (ref 65–99)
Potassium: 4.3 mmol/L (ref 3.5–5.1)
Sodium: 137 mmol/L (ref 135–145)

## 2015-12-31 LAB — BRAIN NATRIURETIC PEPTIDE: B Natriuretic Peptide: 219.6 pg/mL — ABNORMAL HIGH (ref 0.0–100.0)
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
## 2015-12-31 NOTE — Patient Instructions (Signed)
Routine lab work today. Will notify you of abnormal results  Follow up and Echo with Dr.McLean in 1 month.

## 2015-12-31 NOTE — Addendum Note (Signed)
Encounter addended by: [name], MD on: 12/31/2015  9:04 PM<BR>    Actions taken: Sign clinical note, LOS modified, Follow-up modified, Visit diagnoses modified, Problem List modified, Problem List reviewed

## 2016-01-14 ENCOUNTER — Other Ambulatory Visit (HOSPITAL_COMMUNITY): Payer: Self-pay | Admitting: Internal Medicine

## 2016-01-14 DIAGNOSIS — R1310 Dysphagia, unspecified: Secondary | ICD-10-CM

## 2016-01-15 ENCOUNTER — Ambulatory Visit: Payer: Medicare Other | Admitting: Radiation Oncology

## 2016-01-18 ENCOUNTER — Ambulatory Visit (HOSPITAL_COMMUNITY)
Admission: RE | Admit: 2016-01-18 | Discharge: 2016-01-18 | Disposition: A | Discharge status: Home / Self Care | Payer: Medicare Other | Source: Ambulatory Visit | Attending: Internal Medicine | Admitting: Internal Medicine

## 2016-01-18 ENCOUNTER — Ambulatory Visit (HOSPITAL_COMMUNITY): Admission: RE | Admit: 2016-01-18 | Payer: Medicare Other | Source: Ambulatory Visit

## 2016-01-18 ENCOUNTER — Ambulatory Visit (HOSPITAL_COMMUNITY): Payer: Medicare Other

## 2016-01-18 DIAGNOSIS — R1310 Dysphagia, unspecified: Secondary | ICD-10-CM

## 2016-02-02 ENCOUNTER — Ambulatory Visit (HOSPITAL_COMMUNITY)
Admission: RE | Admit: 2016-02-02 | Discharge: 2016-02-02 | Disposition: A | Discharge status: Home / Self Care | Payer: Medicare Other | Source: Ambulatory Visit | Attending: Internal Medicine | Admitting: Internal Medicine

## 2016-02-02 ENCOUNTER — Encounter (HOSPITAL_COMMUNITY): Payer: Self-pay

## 2016-02-02 ENCOUNTER — Ambulatory Visit (HOSPITAL_BASED_OUTPATIENT_CLINIC_OR_DEPARTMENT_OTHER)
Admission: RE | Admit: 2016-02-02 | Discharge: 2016-02-02 | Disposition: A | Discharge status: Home / Self Care | Payer: Medicare Other | Source: Ambulatory Visit | Attending: Internal Medicine | Admitting: Internal Medicine

## 2016-02-02 VITALS — BP 132/70 | HR 69 | Wt 150.0 lb

## 2016-02-02 DIAGNOSIS — R42 Dizziness and giddiness: Secondary | ICD-10-CM | POA: Diagnosis not present

## 2016-02-02 DIAGNOSIS — I351 Nonrheumatic aortic (valve) insufficiency: Secondary | ICD-10-CM | POA: Diagnosis not present

## 2016-02-02 DIAGNOSIS — I509 Heart failure, unspecified: Secondary | ICD-10-CM | POA: Diagnosis not present

## 2016-02-02 DIAGNOSIS — I080 Rheumatic disorders of both mitral and aortic valves: Secondary | ICD-10-CM | POA: Diagnosis not present

## 2016-02-02 DIAGNOSIS — R0609 Other forms of dyspnea: Secondary | ICD-10-CM

## 2016-02-02 DIAGNOSIS — I340 Nonrheumatic mitral (valve) insufficiency: Secondary | ICD-10-CM | POA: Insufficient documentation

## 2016-02-02 DIAGNOSIS — Z8673 Personal history of transient ischemic attack (TIA), and cerebral infarction without residual deficits: Secondary | ICD-10-CM | POA: Diagnosis not present

## 2016-02-02 DIAGNOSIS — I110 Hypertensive heart disease with heart failure: Secondary | ICD-10-CM | POA: Diagnosis not present

## 2016-02-02 MED ORDER — POTASSIUM CHLORIDE ER 10 MEQ PO TBCR: 10.0000 meq | EXTENDED_RELEASE_TABLET | Freq: Every day | ORAL | 3 refills | Status: DC

## 2016-02-02 MED ORDER — FUROSEMIDE 20 MG PO TABS: 20.0000 mg | ORAL_TABLET | Freq: Every day | ORAL | 3 refills | Status: DC

## 2016-02-02 NOTE — Patient Instructions (Signed)
Start Furosemide (lasix) 20 mg daily  Start Potassium 10 meq daily  CAN STOP BOTH IF BREATHING IS NOT BETTER IN 2 WEEKS  Labs in 10 days  Your physician recommends that you schedule a follow-up appointment in: Pulmonary  We will contact you in 4 months to schedule your next appointment.

## 2016-02-02 NOTE — Progress Notes (Signed)
Echocardiogram 2D Echocardiogram has been performed.  Adrian West 02/02/2016, 9:50 AM
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
## 2016-02-09 ENCOUNTER — Encounter: Payer: Self-pay | Admitting: Radiation Oncology

## 2016-02-09 NOTE — Progress Notes (Signed)
error 

## 2016-02-11 ENCOUNTER — Emergency Department (HOSPITAL_COMMUNITY): Payer: Medicare Other

## 2016-02-11 ENCOUNTER — Inpatient Hospital Stay (HOSPITAL_COMMUNITY): Payer: Medicare Other

## 2016-02-11 ENCOUNTER — Encounter (HOSPITAL_COMMUNITY): Payer: Self-pay | Admitting: Emergency Medicine

## 2016-02-11 ENCOUNTER — Inpatient Hospital Stay (HOSPITAL_COMMUNITY)
Admission: EM | Admit: 2016-02-11 | Discharge: 2016-02-15 | DRG: 481 | Disposition: A | Discharge status: Skilled Nursing Facility | Payer: Medicare Other | Attending: Internal Medicine | Admitting: Internal Medicine

## 2016-02-11 DIAGNOSIS — Y92128 Other place in nursing home as the place of occurrence of the external cause: Secondary | ICD-10-CM

## 2016-02-11 DIAGNOSIS — R001 Bradycardia, unspecified: Secondary | ICD-10-CM | POA: Diagnosis present

## 2016-02-11 DIAGNOSIS — Z79899 Other long term (current) drug therapy: Secondary | ICD-10-CM

## 2016-02-11 DIAGNOSIS — Z923 Personal history of irradiation: Secondary | ICD-10-CM

## 2016-02-11 DIAGNOSIS — G459 Transient cerebral ischemic attack, unspecified: Secondary | ICD-10-CM | POA: Diagnosis not present

## 2016-02-11 DIAGNOSIS — D472 Monoclonal gammopathy: Secondary | ICD-10-CM | POA: Diagnosis present

## 2016-02-11 DIAGNOSIS — Z800 Family history of malignant neoplasm of digestive organs: Secondary | ICD-10-CM | POA: Diagnosis not present

## 2016-02-11 DIAGNOSIS — E785 Hyperlipidemia, unspecified: Secondary | ICD-10-CM | POA: Diagnosis present

## 2016-02-11 DIAGNOSIS — Z6821 Body mass index (BMI) 21.0-21.9, adult: Secondary | ICD-10-CM | POA: Diagnosis not present

## 2016-02-11 DIAGNOSIS — G510 Bell's palsy: Secondary | ICD-10-CM | POA: Diagnosis present

## 2016-02-11 DIAGNOSIS — W010XXA Fall on same level from slipping, tripping and stumbling without subsequent striking against object, initial encounter: Secondary | ICD-10-CM | POA: Diagnosis present

## 2016-02-11 DIAGNOSIS — G629 Polyneuropathy, unspecified: Secondary | ICD-10-CM | POA: Diagnosis present

## 2016-02-11 DIAGNOSIS — S72009A Fracture of unspecified part of neck of unspecified femur, initial encounter for closed fracture: Secondary | ICD-10-CM

## 2016-02-11 DIAGNOSIS — Z66 Do not resuscitate: Secondary | ICD-10-CM | POA: Diagnosis present

## 2016-02-11 DIAGNOSIS — Z85828 Personal history of other malignant neoplasm of skin: Secondary | ICD-10-CM

## 2016-02-11 DIAGNOSIS — M25552 Pain in left hip: Secondary | ICD-10-CM | POA: Insufficient documentation

## 2016-02-11 DIAGNOSIS — E039 Hypothyroidism, unspecified: Secondary | ICD-10-CM | POA: Diagnosis present

## 2016-02-11 DIAGNOSIS — H9190 Unspecified hearing loss, unspecified ear: Secondary | ICD-10-CM | POA: Diagnosis present

## 2016-02-11 DIAGNOSIS — Z952 Presence of prosthetic heart valve: Secondary | ICD-10-CM | POA: Diagnosis not present

## 2016-02-11 DIAGNOSIS — E440 Moderate protein-calorie malnutrition: Secondary | ICD-10-CM | POA: Diagnosis present

## 2016-02-11 DIAGNOSIS — I5032 Chronic diastolic (congestive) heart failure: Secondary | ICD-10-CM

## 2016-02-11 DIAGNOSIS — Z9049 Acquired absence of other specified parts of digestive tract: Secondary | ICD-10-CM

## 2016-02-11 DIAGNOSIS — I10 Essential (primary) hypertension: Secondary | ICD-10-CM | POA: Diagnosis not present

## 2016-02-11 DIAGNOSIS — Z888 Allergy status to other drugs, medicaments and biological substances status: Secondary | ICD-10-CM

## 2016-02-11 DIAGNOSIS — Z7982 Long term (current) use of aspirin: Secondary | ICD-10-CM

## 2016-02-11 DIAGNOSIS — T148XXA Other injury of unspecified body region, initial encounter: Secondary | ICD-10-CM

## 2016-02-11 DIAGNOSIS — Z85818 Personal history of malignant neoplasm of other sites of lip, oral cavity, and pharynx: Secondary | ICD-10-CM

## 2016-02-11 DIAGNOSIS — Z8249 Family history of ischemic heart disease and other diseases of the circulatory system: Secondary | ICD-10-CM

## 2016-02-11 DIAGNOSIS — Z8673 Personal history of transient ischemic attack (TIA), and cerebral infarction without residual deficits: Secondary | ICD-10-CM

## 2016-02-11 DIAGNOSIS — J849 Interstitial pulmonary disease, unspecified: Secondary | ICD-10-CM | POA: Diagnosis present

## 2016-02-11 DIAGNOSIS — W19XXXA Unspecified fall, initial encounter: Secondary | ICD-10-CM

## 2016-02-11 DIAGNOSIS — S72002A Fracture of unspecified part of neck of left femur, initial encounter for closed fracture: Principal | ICD-10-CM | POA: Insufficient documentation

## 2016-02-11 LAB — CBC WITH DIFFERENTIAL/PLATELET
BASOS PCT: 0 %
Basophils Absolute: 0 10*3/uL (ref 0.0–0.1)
EOS ABS: 0.1 10*3/uL (ref 0.0–0.7)
EOS PCT: 2 %
HCT: 43.8 % (ref 39.0–52.0)
Hemoglobin: 14.4 g/dL (ref 13.0–17.0)
LYMPHS ABS: 0.7 10*3/uL (ref 0.7–4.0)
Lymphocytes Relative: 9 %
MCH: 31.8 pg (ref 26.0–34.0)
MCHC: 32.9 g/dL (ref 30.0–36.0)
MCV: 96.7 fL (ref 78.0–100.0)
Monocytes Absolute: 0.3 10*3/uL (ref 0.1–1.0)
Monocytes Relative: 4 %
Neutro Abs: 6.5 10*3/uL (ref 1.7–7.7)
Neutrophils Relative %: 85 %
PLATELETS: 201 10*3/uL (ref 150–400)
RBC: 4.53 MIL/uL (ref 4.22–5.81)
RDW: 14.6 % (ref 11.5–15.5)
WBC: 7.6 10*3/uL (ref 4.0–10.5)

## 2016-02-11 LAB — BASIC METABOLIC PANEL
Anion gap: 7 (ref 5–15)
BUN: 15 mg/dL (ref 6–20)
CO2: 28 mmol/L (ref 22–32)
CREATININE: 0.9 mg/dL (ref 0.61–1.24)
Calcium: 8.9 mg/dL (ref 8.9–10.3)
Chloride: 100 mmol/L — ABNORMAL LOW (ref 101–111)
GFR calc Af Amer: >60 mL/min (ref >60)
Glucose, Bld: 100 mg/dL — ABNORMAL HIGH (ref 65–99)
POTASSIUM: 4.2 mmol/L (ref 3.5–5.1)
SODIUM: 135 mmol/L (ref 135–145)

## 2016-02-11 LAB — PROTIME-INR
INR: 0.99
Prothrombin Time: 13.1 seconds (ref 11.4–15.2)

## 2016-02-11 LAB — TYPE AND SCREEN
ABO/RH(D): A NEG
Antibody Screen: NEGATIVE

## 2016-02-11 LAB — APTT: aPTT: 30 seconds (ref 24–36)

## 2016-02-11 MED ORDER — METHOCARBAMOL 1000 MG/10ML IJ SOLN
500.0000 mg | Freq: Four times a day (QID) | INTRAMUSCULAR | Status: DC | PRN
  Filled 2016-02-11: qty 5

## 2016-02-11 MED ORDER — MORPHINE SULFATE (PF) 2 MG/ML IV SOLN
1.0000 mg | INTRAVENOUS | Status: DC | PRN
  Administered 2016-02-11: 1 mg via INTRAVENOUS
  Filled 2016-02-11: qty 1

## 2016-02-11 MED ORDER — DOCUSATE SODIUM 100 MG PO CAPS
100.0000 mg | ORAL_CAPSULE | Freq: Two times a day (BID) | ORAL | Status: DC | PRN
  Filled 2016-02-11: qty 1

## 2016-02-11 MED ORDER — SODIUM CHLORIDE 0.9 % IV SOLN
Freq: Once | INTRAVENOUS | Status: AC
  Administered 2016-02-11: 18:00:00 via INTRAVENOUS

## 2016-02-11 MED ORDER — OXYCODONE-ACETAMINOPHEN 5-325 MG PO TABS
1.0000 | ORAL_TABLET | ORAL | Status: DC | PRN
  Administered 2016-02-11 – 2016-02-14 (×3): 1 via ORAL
  Filled 2016-02-11 (×3): qty 1

## 2016-02-11 MED ORDER — ASPIRIN EC 81 MG PO TBEC
81.0000 mg | DELAYED_RELEASE_TABLET | Freq: Every day | ORAL | Status: DC
  Administered 2016-02-11 – 2016-02-15 (×5): 81 mg via ORAL
  Filled 2016-02-11 (×5): qty 1

## 2016-02-11 MED ORDER — FENTANYL CITRATE (PF) 100 MCG/2ML IJ SOLN
25.0000 ug | Freq: Once | INTRAMUSCULAR | Status: AC
  Administered 2016-02-11: 25 ug via INTRAVENOUS
  Filled 2016-02-11: qty 2

## 2016-02-11 MED ORDER — METHOCARBAMOL 500 MG PO TABS
500.0000 mg | ORAL_TABLET | Freq: Four times a day (QID) | ORAL | Status: DC | PRN
  Administered 2016-02-11 – 2016-02-12 (×2): 500 mg via ORAL
  Filled 2016-02-11 (×2): qty 1

## 2016-02-11 MED ORDER — LEVOTHYROXINE SODIUM 25 MCG PO TABS
125.0000 ug | ORAL_TABLET | Freq: Every day | ORAL | Status: DC
  Administered 2016-02-12 – 2016-02-15 (×4): 125 ug via ORAL
  Filled 2016-02-11 (×4): qty 1

## 2016-02-11 NOTE — ED Notes (Signed)
Pt returned from MRI at this time. Pt in no apparent distress at this time.  Will continue to closely monitor pt.

## 2016-02-11 NOTE — H&P (Signed)
History and Physical    Adrian West Eve [id number] DOB: [birth year] DOA: 02/11/2016  Referring MD/NP/PA:   PCP: [name], MD   Patient coming from:  The patient is coming from independent living facility.  At baseline, pt is independent for most of ADL.  Chief Complaint: left hip pain after fall.  HPI: Adrian West is a 82 y.o. 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  Patient states that he lost balance when coming out from the restroom last night, fell and injured left hip, causing left hip pain. He strongly denies any head injury. No LOC. He denies prodromal symptoms, such as chest pain, unilateral weakness, vision change no hearing loss. His left hip pain is constant, 8 out of 10 severity, nonradiating. It is aggravated by movement. No leg numbness. Patient denies nausea, vomiting, diarrhea, abdominal pain, symptoms of UTI. No fever or chills. He has left facial droop due to Bell's palsy.   ED Course: pt was found to have WBC 7.6, electrolytes and renal function okay. Temperature normal, no tachycardia, no tachypnea, chest x-rays negative for acute abnormalities. X-ray of pelvis that showed possible impacted left femoral neck fracture. X-fay of left femur showed possible nondisplaced fracture. Orthopedic surgeon was consulted, recommended MRI of left hip for further evaluation.   Review of Systems:   General: no fevers, chills, no changes in body weight, has fatigue HEENT: no blurry vision, hearing changes or sore throat Respiratory: no dyspnea, coughing, wheezing CV: no chest pain, no palpitations GI: no nausea, vomiting, abdominal pain, diarrhea, constipation GU: no dysuria, burning on urination,  increased urinary frequency, hematuria  Ext: no leg edema Neuro: no unilateral weakness, numbness, or tingling, no vision change or hearing loss Skin: no rash, no skin tear. MSK: has left hip pain. Heme: No easy bruising.  Travel history: No recent long distant travel.  Allergy:  Allergies  Allergen Reactions  . Plavix [Clopidogrel] Other (See Comments)    Cannot take med    Past Medical History:  Diagnosis Date  . Anorexia 08/16/2014  . Bell's palsy   . Benign neoplasm of colon   . 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 Deaf   . Diaphragm paralysis   . Diverticulosis   . DNR (do not resuscitate) discussion 07/22/2014  . Dyspnea and respiratory abnormality 03/10/2014  . 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 Interstitial lung disease 10/15/13 Xray [facility]) 01/02/12 new subsegmental ATX on L (vs 09/03/11) Seeing Dr Theda Sers [facility]  Seen by Dr Corrin Parker, [facility],  02/2011. Component of thoracic height loss & alcohol related neuropathy questioned. No plication recommended  5/4- 10/14/2013 @ [facility] ; Dr  Ellis Parents.10/15/13 new focal consolidation mid anterior L lung:aspiration vs PNA . Underlying interstitial lung disease.10/15/13 Rx sent to [facility] for Doxycycline & Augmentin by Dr Jeannine Kitten ( not picked up as of 10/18/13).  Flew back to [facility]; seen @ 10/16/13 [facility], [place]. Levaquin X 5 days , Dr Evalee Mutton    . Esophageal stricture   . Gait disturbance   . GERD 02/14/2008   Qualifier: Diagnosis of  By: [name] MD,  William    . Hiatal hernia   . History of radiation therapy 06/11/14-07/22/14   skin cancer of scalp/face  . HYPERTENSION 02/14/2008       . HYPOTHYROIDISM 02/14/2008  . Interstitial lung disease ([place]) 05/05/2014  . Loss of weight 07/22/2014  . Lung  abnormality 11/25/2008   "nerve damage from heart surgery"  . Memory loss 12/31/2009   Qualifier: Diagnosis of  By: [name] MD, [name]    . MGUS (monoclonal gammopathy of unknown significance) 04/11/2013  . MITRAL REGURGITATION 02/14/2008   Qualifier: Diagnosis of  By: [name] MD, Cherlynn Kaiser MV surgery 11/2008, [name].complcated by paralysis of diaphragm     . 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 Neuropathy, peripheral ([place])   . Other and unspecified hyperlipidemia 02/24/2012  . Polycythemia, secondary 01/10/2014  . PVC's (premature ventricular contractions) 04/05/2011  . Radiation 06/11/14- 07/22/14   Right parotid bed, posterior auricular, right upper neck.  . 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 Skin cancer of scalp or skin of neck 05/21/2014  . SPINAL STENOSIS, LUMBAR 07/28/2010   Qualifier: Diagnosis of  By: [name] MD, [name], NS ( retired)    . Squamous carcinoma ([place])   . TIA (transient ischemic attack) May 2012  . Tremor 2015  . Tremor, essential   . Vertigo 2005  . Vertigo 2005  . Weakness generalized 07/22/2014    Past Surgical History:  Procedure Laterality Date  . APPENDECTOMY    . BROW LIFT Right 04/25/2014   Midforehead  . CARDIAC CATHETERIZATION    . CARDIAC VALVE REPLACEMENT    . CATARACT EXTRACTION     [name]  . CHOLECYSTECTOMY  1998   [name]  . ECTROPION REPAIR Right 04/25/2014   Right tarsal strip  . ESOPHAGEAL DILATION      X3;[name]  . INGUINAL HERNIA REPAIR    . LAPAROSCOPIC ABLATION RENAL MASS  08/14/2009  . LUMBAR LAMINECTOMY    . 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 MOHS SURGERY  2012    ear  . NASAL SINUS SURGERY    . Nasolabial repair Right 04/25/2014   Right  static sling using AlloDerm graft elevation of the nasolabial fold  . NECK DISSECTION Right 04/25/2014  . PAROTIDECTOMY Right 04/25/2014   Sacrifice of the facial nerve  . Partial auriculectomy Right 04/25/2014  . RIGHT HEART CATHETERIZATION N/A 03/03/2014   Procedure: RIGHT HEART CATH;  Surgeon: [name], MD;  Location: [facility] CATH LAB;  Service: Cardiovascular;  Laterality: N/A;  . TILT TABLE STUDY N/A 05/06/2013   Procedure: TILT TABLE STUDY;  Surgeon: [name], MD;  Location: [facility] CATH LAB;  Service: Cardiovascular;  Laterality: N/A;  . TONSILLECTOMY      Social History:  reports that he has never smoked. He has never used smokeless tobacco. He reports that he does not drink alcohol or use drugs.  Family History:  Family History  Problem Relation Age of Onset  . Colon cancer Mother 32  . Heart attack Father 14  . Heart attack Maternal Grandmother 60  . Uterine cancer Paternal Grandmother   . Healthy Sister   . Stroke Neg Hx   .  Diabetes Neg Hx      Prior to Admission medications   Medication Sig Start Date End Date Taking? Authorizing Provider  aspirin EC 81 MG tablet Take 81 mg by mouth daily.    Historical Provider, MD  furosemide (LASIX) 20 MG tablet Take 1 tablet (20 mg total) by mouth daily. Patient not taking: Reported on 02/11/2016 02/02/16   [name], MD  levothyroxine (SYNTHROID, LEVOTHROID) 125 MCG tablet Take 125 mcg by mouth daily before breakfast.    Historical Provider, MD  potassium chloride (K-DUR) 10 MEQ tablet Take 1 tablet (10 mEq total) by mouth daily. Patient not taking: Reported on 02/11/2016 02/02/16 05/02/16  [name], MD    Physical Exam: Vitals:   02/11/16 2036 02/11/16 2100 02/11/16 2300 02/12/16 0446  BP:  110/77 123/71 122/66  Pulse:  77 74 63  Resp:  24 18 16   Temp:   98.2 F (36.8 C) 98.7 F (37.1 C)  TempSrc:   Oral Oral  SpO2: 100% 95% 94% 95%  Weight:      Height:       General: Not in acute distress HEENT:        Eyes: PERRL, EOMI, no scleral icterus.       ENT: No discharge from the ears and nose, no pharynx injection, no tonsillar enlargement. Right ear lower lob is missing       Neck: No JVD, no bruit, no mass felt. Heme: No neck lymph node enlargement. Cardiac: S1/S2, RRR, No murmurs, No gallops or rubs. Respiratory: No rales, wheezing, rhonchi or rubs. GI: Soft, nondistended, nontender, no rebound pain, no organomegaly, BS present. GU: No hematuria Ext: No pitting leg edema bilaterally. 2+DP/PT pulse bilaterally. Musculoskeletal: has tenderness over left hip. Skin: No rashes or skin tear. Neuro: Alert, oriented X3, cranial nerves II-XII grossly intact except for right facial droop, moves all extremities normally.   Psych: Patient is not psychotic, no suicidal or hemocidal ideation.  Labs on Admission: I have personally reviewed following labs and imaging studies  CBC:  Recent Labs Lab 02/11/16 1605  WBC 7.6  NEUTROABS 6.5  HGB 14.4  HCT 43.8  MCV 96.7  PLT 123456   Basic Metabolic Panel:  Recent Labs Lab 02/11/16 1605  NA 135  K 4.2  CL 100*  CO2 28  GLUCOSE 100*  BUN 15  CREATININE 0.90  CALCIUM 8.9   GFR: Estimated Creatinine Clearance: 57.7 mL/min (by C-G formula based on SCr of 0.9 mg/dL). Liver Function Tests: No results for input(s): AST, ALT, ALKPHOS, BILITOT, PROT, ALBUMIN in the last 168 hours. No results for input(s): LIPASE, AMYLASE in the last 168 hours. No results for input(s): AMMONIA in the last 168 hours. Coagulation Profile:  Recent Labs Lab 02/11/16 2110  INR 0.99   Cardiac Enzymes: No results for input(s): CKTOTAL, CKMB, CKMBINDEX, TROPONINI in the last 168 hours. BNP (last 3 results) No results for input(s): PROBNP in the last 8760 hours. HbA1C: No results for input(s): HGBA1C in the last 72 hours. CBG: No results for input(s): GLUCAP in the last 168 hours. Lipid Profile: No results for input(s): CHOL, HDL, LDLCALC, TRIG, CHOLHDL,  LDLDIRECT in the last 72 hours. Thyroid Function Tests: No results for input(s): TSH, T4TOTAL, FREET4, T3FREE, THYROIDAB in the last 72 hours. Anemia Panel: No results for input(s): VITAMINB12, FOLATE, FERRITIN, TIBC, IRON, RETICCTPCT in the last 72 hours. Urine analysis:    Component Value Date/Time   COLORURINE YELLOW 02/12/2016 0630   APPEARANCEUR  CLEAR 02/12/2016 0630   LABSPEC 1.015 02/12/2016 0630   PHURINE 6.5 02/12/2016 0630   GLUCOSEU NEGATIVE 02/12/2016 0630   HGBUR NEGATIVE 02/12/2016 0630   HGBUR negative 02/14/2008 0841   BILIRUBINUR NEGATIVE 02/12/2016 0630   BILIRUBINUR Neg 01/02/2012 1702   KETONESUR NEGATIVE 02/12/2016 0630   PROTEINUR NEGATIVE 02/12/2016 0630   UROBILINOGEN 0.2 10/27/2014 1214   NITRITE NEGATIVE 02/12/2016 0630   LEUKOCYTESUR NEGATIVE 02/12/2016 0630   Sepsis Labs: @LABRCNTIP (procalcitonin:4,lacticidven:4) ) Recent Results (from the past 240 hour(s))  Surgical PCR screen     Status: None   Collection Time: 02/12/16  1:49 AM  Result Value Ref Range Status   MRSA, PCR NEGATIVE NEGATIVE Final   Staphylococcus aureus NEGATIVE NEGATIVE Final    Comment:        The Xpert SA Assay (FDA approved for NASAL specimens in patients over 22 years of age), is one component of a comprehensive surveillance program.  Test performance has been validated by [facility] for patients greater than or equal to 79 year old. It is not intended to diagnose infection nor to guide or monitor treatment.      Radiological Exams on Admission: Dg Chest 1 View  Result Date: 02/11/2016 CLINICAL DATA:  Status post fall last night from standing position. Patient undergoing evaluation of the left hip fracture. Preoperative study. EXAM: CHEST 1 VIEW COMPARISON:  Chest x-ray of Oct 28, 2015 FINDINGS: The patient is rotated on both images. The lungs are adequately inflated and clear. There is no pleural effusion or pneumothorax. There is a prosthetic valve ring in the  mitral position. The heart is normal in size. The pulmonary vascularity is not engorged. There is calcification in the wall of the aortic arch. The observed bony thorax exhibits no acute abnormality. There are degenerative changes of both shoulders. IMPRESSION: There is no pneumonia nor CHF nor other acute cardiopulmonary abnormality. There is a prosthetic mitral valve. Aortic atherosclerosis. Electronically Signed   By: David  Martinique M.D.   On: 02/11/2016 16:52   Dg Pelvis 1-2 Views  Result Date: 02/11/2016 CLINICAL DATA:  Fall last night, left hip pain EXAM: PELVIS - 1-2 VIEW COMPARISON:  None. FINDINGS: Single frontal view of the pelvis submitted. Extensive degenerative changes are noted lower lumbar spine. There is shortening of left femoral neck with a vague lucent line highly suspicious for impacted fracture. Clinical correlation is necessary. Further correlation with CT scan or MRI is recommended. There is asymmetry of left superior and inferior pubic ramus. Although this may be positional impacted fracture cannot be excluded. Further correlation with CT scan is recommended. IMPRESSION: Extensive degenerative changes are noted lower lumbar spine. There is shortening of left femoral neck with a vague lucent line highly suspicious for impacted fracture. Clinical correlation is necessary. Further correlation with CT scan or MRI is recommended. There is asymmetry of left superior and inferior pubic ramus. Although this may be positional impacted fracture cannot be excluded. Further correlation with CT scan is recommended. Electronically Signed   By: Lahoma Crocker M.D.   On: 02/11/2016 16:55   Mr Hip Left Wo Contrast  Result Date: 02/11/2016 CLINICAL DATA:  Left femoral neck fracture. EXAM: MR OF THE LEFT HIP WITHOUT CONTRAST TECHNIQUE: Multiplanar, multisequence MR imaging was performed. No intravenous contrast was administered. COMPARISON:  02/11/2016 FINDINGS: Bones: Nondisplaced left femoral neck fracture  with associated marrow edema. Cortical discontinuity is more obvious laterally rather than medially. Lower lumbar spondylosis and degenerative disc disease. Articular cartilage and labrum  Articular cartilage:  Mild chondral thinning in both hips. Labrum:  Grossly unremarkable Joint or bursal effusion Joint effusion:  Moderate left hip joint effusion. Bursae:  Small amounts of fluid deep to both iliacus muscles. Muscles and tendons Muscles and tendons: Low-level edema in the soft tissues around the left hip joint. Iliopsoas tendons normal and symmetric. Other findings Miscellaneous: Sigmoid colon diverticulosis without active diverticulitis. Left scrotal hydrocele potentially up to the 130 cc. IMPRESSION: 1. Nondisplaced acute left femoral neck fracture with associated marrow edema and hip effusion. 2. Left scrotal hydrocele potentially up to 130 cc in volume. 3. Trace fluid deep to both iliacus muscles. 4. Mild chondral thinning in both hips. 5. Lower lumbar spondylosis and degenerative disc disease. 6. Sigmoid colon diverticulosis. Electronically Signed   By: Van Clines M.D.   On: 02/11/2016 23:45   Dg Femur Min 2 Views Left  Result Date: 02/11/2016 CLINICAL DATA:  Left hip pain after fall at nursing facility. EXAM: LEFT FEMUR 2 VIEWS COMPARISON:  None. FINDINGS: Almyra Brace is seen in left femoral neck concerning for nondisplaced fracture. MRI may be performed for further evaluation. Remaining portion of left femur appears normal. IMPRESSION: Linear lucency seen in left femoral neck concerning for nondisplaced fracture. MRI may be performed for further evaluation. Electronically Signed   By: Marijo Conception, M.D.   On: 02/11/2016 16:54     EKG: Independently reviewed.  Not done in ED, will get one.   Assessment/Plan Principal Problem:   Fracture of femoral neck, left (HCC) Active Problems:   Hypothyroidism   Essential hypertension   TIA (transient ischemic attack)   Possible Fracture of left  femoral neck: As evidenced by x-ray. Patient has moderate pain now. No neurovascular compromise. Orthopedic surgeon was consulted, recommended MRI for further evaluation.   - will admit to Med-surg bed as inpt - MRI of left hip - Pain control: percocet - Robaxin for muscle spasm - f/u ortho further recommendations - type and cross - INR/PTT  Hypothyroidism: Last TSH was 4.448 on 10/27/58 -Continue home Synthroid  HTN: Blood pressure 121/76. Not taking medications currently. -Monitor blood pressure  TIA (transient ischemic attack): No acute issues. -Continue aspirin   DVT ppx: SCD Code Status: Full code Family Communication: None at bed side. Disposition Plan:  Anticipate discharge back to previous independent living facility  Consults called:  Ortho was consulted Admission status:  medical floor/inpt     Date of Service 02/12/2016    Ivor Costa [facility] Pager [phone]  If 7PM-7AM, please contact night-coverage [email or web address] Password [email or web address] 02/12/2016, 7:45 AM

## 2016-02-11 NOTE — Progress Notes (Signed)
Consulted for left nondisplaced femoral neck fracture relate to a fall. Seen Dr.Aluisio and Grioffre in the past. Will order MRI. [place]ucks traction an bedrest. Formal consult to follow and treatment pending MRI results.

## 2016-02-11 NOTE — ED Notes (Signed)
Report called to [place], RN at this time.  Receiving nurse denies having any further questions at this time.

## 2016-02-11 NOTE — ED Triage Notes (Signed)
Pt arrived by [facility] from [facility]. Pt reports falling last night around dinner time from a standing position. Pt states he went to PCP this AM and they stated he had a left hip fracture. Pt denies pain at this time. NAD.

## 2016-02-11 NOTE — ED Notes (Signed)
MD at bedside. 

## 2016-02-11 NOTE — ED Notes (Signed)
Patient transported to MRI at this time via ED stretcher. Pt in no apparent distress at this time.

## 2016-02-11 NOTE — ED Provider Notes (Signed)
[facility] DEPT Provider Note   CSN: [id number] Arrival date & time: 02/11/16  1536     History   Chief Complaint Chief Complaint  Patient presents with  . Hip Pain    HPI RAKIEM FACTOR is a 82 y.o. male.  HPI 82 yo M who p/w left leg pain/hip pain after fall. Pt was walking outside from dinner yesterday evening when he got his legs "tripped up." he fell onto his left side. Denies any head injury or LOC. He reports immediate onset of moderately severe left hip pain at the time but he was able to be held up and walked back to his house. Since then, he has persistent mild, aching, left hip pain made worse with WB and movement. He went to his PCP who sent him to the ED for evaluation. No back pain. No loss of bowel or bladder function.  Past Medical History:  Diagnosis Date  . Anorexia 08/16/2014  . Bell's palsy   . Benign neoplasm of colon   . 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 Deaf   . Diaphragm paralysis   . Diverticulosis   . DNR (do not resuscitate) discussion 07/22/2014  . Dyspnea and respiratory abnormality 03/10/2014  . 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 Interstitial lung disease 10/15/13 Xray [facility]) 01/02/12 new subsegmental ATX on L (vs 09/03/11) Seeing Dr Theda Sers [facility]  Seen by Dr Corrin Parker, [facility],  02/2011. Component of thoracic height loss & alcohol related neuropathy questioned. No plication recommended  5/4- 10/14/2013 @ [facility] ; Dr  Ellis Parents.10/15/13 new focal consolidation mid anterior L lung:aspiration vs PNA . Underlying interstitial lung disease.10/15/13 Rx sent to [place] November for Doxycycline & Augmentin by Dr Jeannine Kitten ( not picked up as of 10/18/13).  Flew back to [facility]; seen @ 10/16/13 [facility] [place]. Levaquin X 5 days , Dr  Evalee Mutton    . Esophageal stricture   . Gait disturbance   . GERD 02/14/2008   Qualifier: Diagnosis of  By: [name] MD, [name]    . Hiatal hernia   . History of radiation therapy 06/11/14-07/22/14   skin cancer of scalp/face  . HYPERTENSION 02/14/2008       . HYPOTHYROIDISM 02/14/2008  . Interstitial lung disease ([place]) 05/05/2014  . Loss of weight 07/22/2014  . Lung abnormality 11/25/2008   "nerve damage from heart surgery"  . Memory loss 12/31/2009   Qualifier: Diagnosis of  By: [name] MD, [name]    . MGUS (monoclonal gammopathy of unknown significance) 04/11/2013  . MITRAL REGURGITATION 02/14/2008   Qualifier: Diagnosis of  By: [name] MD, Cherlynn Kaiser MV surgery 11/2008, [name].complcated by paralysis of diaphragm     . 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 Neuropathy, peripheral ([place])   . Other and unspecified hyperlipidemia 02/24/2012  . Polycythemia, secondary 01/10/2014  . PVC's (premature ventricular contractions) 04/05/2011  . Radiation 06/11/14- 07/22/14   Right parotid bed, posterior auricular, right upper neck.  . 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 Skin cancer of scalp or skin of neck 05/21/2014  . SPINAL STENOSIS, LUMBAR 07/28/2010   Qualifier: Diagnosis of  ByLinna Darner MD, [name], NS ( retired)    . Squamous carcinoma ([place])   . TIA (transient ischemic attack) May 2012  . Tremor 2015  . Tremor, essential   . Vertigo 2005  . Vertigo 2005  . Weakness generalized 07/22/2014    Patient Active Problem List   Diagnosis Date Noted  . Left hip pain 02/11/2016  . Fracture of femoral neck, left ([place]) 02/11/2016  . Closed left hip fracture ([place]) 02/11/2016  . Dizziness 12/31/2015  . Exertional dyspnea 12/31/2015  . Obstructive chronic bronchitis with exacerbation ([place])  11/13/2014  . Orthostatic hypotension 10/29/2014  . Protein-calorie malnutrition, severe ([place]) 10/28/2014  . Hypotension 10/27/2014  . SCCA (squamous cell carcinoma) of skin 10/27/2014  . Acute bronchitis 10/15/2014  . Encounter for feeding tube placement 09/17/2014  . Anorexia 08/16/2014  . Dysphagia   . DNR (do not resuscitate) discussion 07/22/2014  . Palliative care encounter 07/22/2014  . Loss of weight 07/22/2014  . 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 Polycythemia, secondary 01/10/2014  . 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 MGUS (monoclonal gammopathy of unknown significance) 04/11/2013  . Overweight (BMI 25.0-29.9) 03/20/2012  . Other and unspecified hyperlipidemia 02/24/2012  . PVC's (premature ventricular contractions) 04/05/2011  . TIA (transient ischemic attack) 10/12/2010  . SPINAL STENOSIS, LUMBAR 07/28/2010  . Hypothyroidism 02/14/2008  . MITRAL REGURGITATION 02/14/2008  . Essential hypertension 02/14/2008  . GERD 02/14/2008    Past Surgical History:  Procedure Laterality Date  . APPENDECTOMY    . BROW LIFT Right 04/25/2014   Midforehead  . CARDIAC CATHETERIZATION    . CARDIAC VALVE REPLACEMENT    . CATARACT EXTRACTION     [name]  . CHOLECYSTECTOMY  1998   [name]  . ECTROPION REPAIR Right 04/25/2014   Right tarsal strip  . ESOPHAGEAL DILATION      X3;[name]  . INGUINAL HERNIA REPAIR    . LAPAROSCOPIC ABLATION RENAL MASS  08/14/2009  . LUMBAR LAMINECTOMY    . 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 MOHS SURGERY  2012    ear  . NASAL SINUS SURGERY    . Nasolabial repair Right 04/25/2014   Right static sling using AlloDerm graft elevation of the nasolabial fold  . NECK DISSECTION Right 04/25/2014  . PAROTIDECTOMY Right 04/25/2014   Sacrifice of the facial nerve  . Partial auriculectomy Right  04/25/2014  . RIGHT HEART CATHETERIZATION N/A 03/03/2014   Procedure: RIGHT HEART CATH;  Surgeon: [name], MD;  Location[facility] C[facility] LAB;  Service: Cardiovascular;  Laterality: N/A;  . TILT TABLE STUDY N/A 05/06/2013   Procedure: TILT TABLE STUDY;  Surgeon: [name], MD;  Location: [facility] CATH LAB;  Service: Cardiovascular;  Laterality: N/A;  . TONSILLECTOMY         Home Medications    Prior to Admission medications   Medication Sig Start Date End Date Taking? Authorizing Provider  aspirin EC 81 MG tablet Take 81 mg by mouth daily.    Historical Provider, MD  furosemide (LASIX) 20 MG tablet Take 1 tablet (20 mg total) by mouth daily. Patient not taking: Reported on 02/11/2016 02/02/16   [name], MD  levothyroxine (SYNTHROID, LEVOTHROID) 125 MCG tablet Take 125 mcg by mouth daily before breakfast.    Historical Provider, MD  potassium chloride (K-DUR) 10  MEQ tablet Take 1 tablet (10 mEq total) by mouth daily. Patient not taking: Reported on 02/11/2016 02/02/16 05/02/16  [name], MD    Family History Family History  Problem Relation Age of Onset  . Colon cancer Mother 47  . Heart attack Father 81  . Heart attack Maternal Grandmother 60  . Uterine cancer Paternal Grandmother   . Healthy Sister   . Stroke Neg Hx   . Diabetes Neg Hx     Social History Social History  Substance Use Topics  . Smoking status: Never Smoker  . Smokeless tobacco: Never Used  . Alcohol use No     Comment: Drinks wine 3 per night     Allergies   Plavix [clopidogrel]   Review of Systems Review of Systems  Constitutional: Negative for chills, fatigue and fever.  HENT: Negative for congestion and rhinorrhea.   Eyes: Negative for visual disturbance.  Respiratory: Negative for cough, shortness of breath and wheezing.   Cardiovascular: Negative for chest pain and leg swelling.  Gastrointestinal: Negative for abdominal pain, diarrhea, nausea and vomiting.  Genitourinary: Negative  for dysuria and flank pain.  Musculoskeletal: Positive for arthralgias and gait problem. Negative for neck pain and neck stiffness.  Skin: Negative for rash and wound.  Allergic/Immunologic: Negative for immunocompromised state.  Neurological: Negative for syncope, weakness and headaches.  All other systems reviewed and are negative.    Physical Exam Updated Vital Signs BP 123/71 (BP Location: Right Arm)   Pulse 74   Temp 98.2 F (36.8 C) (Oral)   Resp 18   Ht 5\' 10"  (1.778 m)   Wt 150 lb (68 kg)   SpO2 94%   BMI 21.52 kg/m   Physical Exam  Constitutional: He is oriented to person, place, and time. He appears well-developed and well-nourished. No distress.  HENT:  Head: Normocephalic and atraumatic.  Eyes: Conjunctivae are normal.  Neck: Neck supple.  Cardiovascular: Normal rate, regular rhythm and normal heart sounds.  Exam reveals no friction rub.   No murmur heard. Pulmonary/Chest: Effort normal and breath sounds normal. No respiratory distress. He has no wheezes. He has no rales.  Abdominal: He exhibits no distension.  Musculoskeletal: He exhibits no edema.  Neurological: He is alert and oriented to person, place, and time. He exhibits normal muscle tone.  Skin: Skin is warm. Capillary refill takes less than 2 seconds.  Psychiatric: He has a normal mood and affect.  Nursing note and vitals reviewed.   LOWER EXTREMITY EXAM: LEFT  INSPECTION & PALPATION: Mild TTP over left greater trochanter. No deformity or step-offs. No open wounds  SENSORY: sensation is intact to light touch in:  Superficial peroneal nerve distribution (over dorsum of foot) Deep peroneal nerve distribution (over first dorsal web space) Sural nerve distribution (over lateral aspect 5th metatarsal) Saphenous nerve distribution (over medial instep)  MOTOR:  + Motor EHL (great toe dorsiflexion) + FHL (great toe plantar flexion)  + TA (ankle dorsiflexion)  + GSC (ankle plantar  flexion)  VASCULAR: 2+ dorsalis pedis and posterior tibialis pulses Capillary refill < 2 sec, toes warm and well-perfused  COMPARTMENTS: Soft, warm, well-perfused No pain with passive extension No parethesias   ED Treatments / Results  Labs (all labs ordered are listed, but only abnormal results are displayed) Labs Reviewed  BASIC METABOLIC PANEL - Abnormal; Notable for the following:       Result Value   Chloride 100 (*)    Glucose, Bld 100 (*)  All other components within normal limits  CBC WITH DIFFERENTIAL/PLATELET  PROTIME-INR  APTT  URINALYSIS, ROUTINE W REFLEX MICROSCOPIC (NOT AT [facility])  TYPE AND SCREEN    EKG  EKG Interpretation None       Radiology Dg Chest 1 View  Result Date: 02/11/2016 CLINICAL DATA:  Status post fall last night from standing position. Patient undergoing evaluation of the left hip fracture. Preoperative study. EXAM: CHEST 1 VIEW COMPARISON:  Chest x-ray of Oct 28, 2015 FINDINGS: The patient is rotated on both images. The lungs are adequately inflated and clear. There is no pleural effusion or pneumothorax. There is a prosthetic valve ring in the mitral position. The heart is normal in size. The pulmonary vascularity is not engorged. There is calcification in the wall of the aortic arch. The observed bony thorax exhibits no acute abnormality. There are degenerative changes of both shoulders. IMPRESSION: There is no pneumonia nor CHF nor other acute cardiopulmonary abnormality. There is a prosthetic mitral valve. Aortic atherosclerosis. Electronically Signed   By: David  Martinique M.D.   On: 02/11/2016 16:52   Dg Pelvis 1-2 Views  Result Date: 02/11/2016 CLINICAL DATA:  Fall last night, left hip pain EXAM: PELVIS - 1-2 VIEW COMPARISON:  None. FINDINGS: Single frontal view of the pelvis submitted. Extensive degenerative changes are noted lower lumbar spine. There is shortening of left femoral neck with a vague lucent line highly suspicious for impacted  fracture. Clinical correlation is necessary. Further correlation with CT scan or MRI is recommended. There is asymmetry of left superior and inferior pubic ramus. Although this may be positional impacted fracture cannot be excluded. Further correlation with CT scan is recommended. IMPRESSION: Extensive degenerative changes are noted lower lumbar spine. There is shortening of left femoral neck with a vague lucent line highly suspicious for impacted fracture. Clinical correlation is necessary. Further correlation with CT scan or MRI is recommended. There is asymmetry of left superior and inferior pubic ramus. Although this may be positional impacted fracture cannot be excluded. Further correlation with CT scan is recommended. Electronically Signed   By: Lahoma Crocker M.D.   On: 02/11/2016 16:55   Mr Hip Left Wo Contrast  Result Date: 02/11/2016 CLINICAL DATA:  Left femoral neck fracture. EXAM: MR OF THE LEFT HIP WITHOUT CONTRAST TECHNIQUE: Multiplanar, multisequence MR imaging was performed. No intravenous contrast was administered. COMPARISON:  02/11/2016 FINDINGS: Bones: Nondisplaced left femoral neck fracture with associated marrow edema. Cortical discontinuity is more obvious laterally rather than medially. Lower lumbar spondylosis and degenerative disc disease. Articular cartilage and labrum Articular cartilage:  Mild chondral thinning in both hips. Labrum:  Grossly unremarkable Joint or bursal effusion Joint effusion:  Moderate left hip joint effusion. Bursae:  Small amounts of fluid deep to both iliacus muscles. Muscles and tendons Muscles and tendons: Low-level edema in the soft tissues around the left hip joint. Iliopsoas tendons normal and symmetric. Other findings Miscellaneous: Sigmoid colon diverticulosis without active diverticulitis. Left scrotal hydrocele potentially up to the 130 cc. IMPRESSION: 1. Nondisplaced acute left femoral neck fracture with associated marrow edema and hip effusion. 2. Left  scrotal hydrocele potentially up to 130 cc in volume. 3. Trace fluid deep to both iliacus muscles. 4. Mild chondral thinning in both hips. 5. Lower lumbar spondylosis and degenerative disc disease. 6. Sigmoid colon diverticulosis. Electronically Signed   By: Van Clines M.D.   On: 02/11/2016 23:45   Dg Femur Min 2 Views Left  Result Date: 02/11/2016 CLINICAL DATA:  Left hip  pain after fall at nursing facility. EXAM: LEFT FEMUR 2 VIEWS COMPARISON:  None. FINDINGS: Almyra Brace is seen in left femoral neck concerning for nondisplaced fracture. MRI may be performed for further evaluation. Remaining portion of left femur appears normal. IMPRESSION: Linear lucency seen in left femoral neck concerning for nondisplaced fracture. MRI may be performed for further evaluation. Electronically Signed   By: Marijo Conception, M.D.   On: 02/11/2016 16:54    Procedures Procedures (including critical care time)  Medications Ordered in ED Medications  levothyroxine (SYNTHROID, LEVOTHROID) tablet 125 mcg (not administered)  aspirin EC tablet 81 mg (81 mg Oral Given 02/11/16 2349)  oxyCODONE-acetaminophen (PERCOCET/ROXICET) 5-325 MG per tablet 1 tablet (1 tablet Oral Given 02/11/16 2245)  morphine 2 MG/ML injection 1 mg (1 mg Intravenous Given 02/11/16 2349)  docusate sodium (COLACE) capsule 100 mg (not administered)  methocarbamol (ROBAXIN) tablet 500 mg (500 mg Oral Given 02/11/16 2349)    Or  methocarbamol (ROBAXIN) 500 mg in dextrose 5 % 50 mL IVPB ( Intravenous See Alternative 02/11/16 2349)  0.9 %  sodium chloride infusion ( Intravenous New Bag/Given 02/11/16 1750)  fentaNYL (SUBLIMAZE) injection 25 mcg (25 mcg Intravenous Given 02/11/16 1752)     Initial Impression / Assessment and Plan / ED Course  I have reviewed the triage vital signs and the nursing notes.  Pertinent labs & imaging results that were available during my care of the patient were reviewed by me and considered in my medical decision making (see  chart for details).  Clinical Course   82 yo M with extensive PMHx who p/w left hip pain after mechanical fall. Pt otherwise at baseline state of health. On arrival, VSS and WNL. Exam as above. Pt NVI distally. Plain films show likely non-displaced left femoral neck fx. Pt requesting Dr. Aluisio/other physician from [facility]. Discussed with on-call physician, will obtain MR given question of fx on plain films and admit to medicine. Otherwise, CXR clear. Lab work otherwise unremarkable. No other signs of trauma.  Final Clinical Impressions(s) / ED Diagnoses   Final diagnoses:  Chronic diastolic (congestive) heart failure ([place])  Closed left hip fracture, initial encounter [facility])    New Prescriptions Current Discharge Medication List       Duffy Bruce, MD 02/12/16 [phone]

## 2016-02-12 ENCOUNTER — Ambulatory Visit: Payer: Medicare Other

## 2016-02-12 ENCOUNTER — Telehealth: Payer: Self-pay

## 2016-02-12 ENCOUNTER — Other Ambulatory Visit (HOSPITAL_COMMUNITY): Payer: Self-pay

## 2016-02-12 ENCOUNTER — Ambulatory Visit
Admission: RE | Admit: 2016-02-12 | Discharge: 2016-02-12 | Disposition: A | Discharge status: Home / Self Care | Payer: Medicare Other | Source: Ambulatory Visit | Attending: Radiation Oncology | Admitting: Radiation Oncology

## 2016-02-12 DIAGNOSIS — G459 Transient cerebral ischemic attack, unspecified: Secondary | ICD-10-CM

## 2016-02-12 DIAGNOSIS — E440 Moderate protein-calorie malnutrition: Secondary | ICD-10-CM | POA: Insufficient documentation

## 2016-02-12 DIAGNOSIS — S72002A Fracture of unspecified part of neck of left femur, initial encounter for closed fracture: Principal | ICD-10-CM

## 2016-02-12 DIAGNOSIS — E039 Hypothyroidism, unspecified: Secondary | ICD-10-CM

## 2016-02-12 HISTORY — DX: Reserved for concepts with insufficient information to code with codable children: IMO0002

## 2016-02-12 HISTORY — DX: Reserved for inherently not codable concepts without codable children: IMO0001

## 2016-02-12 LAB — URINALYSIS, ROUTINE W REFLEX MICROSCOPIC
Bilirubin Urine: NEGATIVE
GLUCOSE, UA: NEGATIVE mg/dL
HGB URINE DIPSTICK: NEGATIVE
KETONES UR: NEGATIVE mg/dL
Leukocytes, UA: NEGATIVE
Nitrite: NEGATIVE
PROTEIN: NEGATIVE mg/dL
Specific Gravity, Urine: 1.015 (ref 1.005–1.030)
pH: 6.5 (ref 5.0–8.0)

## 2016-02-12 LAB — SURGICAL PCR SCREEN
MRSA, PCR: NEGATIVE
Staphylococcus aureus: NEGATIVE

## 2016-02-12 MED ORDER — ENSURE ENLIVE PO LIQD
237.0000 mL | Freq: Two times a day (BID) | ORAL | Status: DC
  Administered 2016-02-12 – 2016-02-15 (×6): 237 mL via ORAL

## 2016-02-12 NOTE — Plan of Care (Signed)
Problem: Pain Managment: Goal: General experience of comfort will improve Outcome: Progressing Medicated once for pain  Problem: Nutrition: Goal: Adequate nutrition will be maintained Outcome: Not Progressing Poor appetite  Problem: Bowel/Gastric: Goal: Will not experience complications related to bowel motility Outcome: Progressing No bowel issues reported

## 2016-02-12 NOTE — Consult Note (Signed)
Reason for Consult:Left hip fracture Referring Physician: Dr. Etheleen Nicks  Adrian West is an 82 y.o. male.  HPI: Patient presented to [facility] with left hip pain following a fall on 02/10/16. He is a resident at [facility]. He was walking out of the dinning room with a cane and fell on his left side. Reports vertigo, but no syncope.  History of mitral valve repair in 2012 and TIA's. Reports pain but well controlled with medication. Resting comfortable in bed. Denies SOb, CP, or calf pain.   Past Medical History:  Diagnosis Date  . Anorexia 08/16/2014  . Bell's palsy   . Benign neoplasm of colon   . 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 Deaf   . Diaphragm paralysis   . Diverticulosis   . DNR (do not resuscitate) discussion 07/22/2014  . Dyspnea and respiratory abnormality 03/10/2014  . 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 Interstitial lung disease 10/15/13 Xray [facility]) 01/02/12 new subsegmental ATX on L (vs 09/03/11) Seeing Dr Theda Sers [facility]  Seen by Dr Corrin Parker, [facility],  02/2011. Component of thoracic height loss & alcohol related neuropathy questioned. No plication recommended  5/4- 10/14/2013 @ [facility] ; Dr  Ellis Parents.10/15/13 new focal consolidation mid anterior L lung:aspiration vs PNA . Underlying interstitial lung disease.10/15/13 Rx sent to [place] [facility] for Doxycycline & Augmentin by Dr Jeannine Kitten ( not picked up as of 10/18/13).  Flew back to [facility]; seen @ 10/16/13 [facility], [place]. Levaquin X 5 days , Dr Evalee Mutton    . Esophageal stricture   . Gait disturbance   . GERD 02/14/2008   Qualifier: Diagnosis of  By: [name] MD, [name]    . Hiatal hernia   . History of radiation therapy 06/11/14-07/22/14   skin cancer of scalp/face  . HYPERTENSION 02/14/2008       .  HYPOTHYROIDISM 02/14/2008  . Interstitial lung disease ([place]) 05/05/2014  . Loss of weight 07/22/2014  . Lung abnormality 11/25/2008   "nerve damage from heart surgery"  . Memory loss 12/31/2009   Qualifier: Diagnosis of  By: [name] MD, [name]    . MGUS (monoclonal gammopathy of unknown significance) 04/11/2013  . MITRAL REGURGITATION 02/14/2008   Qualifier: Diagnosis of  By: [name] MD, Cherlynn Kaiser MV surgery 11/2008, [name].complcated by paralysis of diaphragm     . 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 Neuropathy, peripheral ([place])   . Other and unspecified hyperlipidemia 02/24/2012  . Polycythemia, secondary 01/10/2014  . PVC's (premature ventricular contractions) 04/05/2011  . Radiation 06/11/14- 07/22/14   Right parotid bed, posterior auricular, right upper neck.  . 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 Skin cancer of scalp or skin of neck 05/21/2014  . SPINAL STENOSIS, LUMBAR 07/28/2010   Qualifier: Diagnosis of  By: [name] MD, [name], NS ( retired)    . Squamous carcinoma ([place])   . TIA (transient ischemic attack) May 2012  . Tremor 2015  . Tremor, essential   . Vertigo 2005  . Vertigo 2005  . Weakness generalized 07/22/2014    Past Surgical History:  Procedure Laterality  Date  . APPENDECTOMY    . BROW LIFT Right 04/25/2014   Midforehead  . CARDIAC CATHETERIZATION    . CARDIAC VALVE REPLACEMENT    . CATARACT EXTRACTION     [name]  . CHOLECYSTECTOMY  1998   [name]  . ECTROPION REPAIR Right 04/25/2014   Right tarsal strip  . ESOPHAGEAL DILATION      X3;[name]  . INGUINAL HERNIA REPAIR    . LAPAROSCOPIC ABLATION RENAL MASS  08/14/2009  . LUMBAR LAMINECTOMY    . MITRAL VALVE REPLACEMENT  11/25/2008   [name]; diaphragm paralysis due to phrenic nerve  injury  . MOHS SURGERY  2012    ear  . NASAL SINUS SURGERY    . Nasolabial repair Right 04/25/2014   Right static sling using AlloDerm graft elevation of the nasolabial fold  . NECK DISSECTION Right 04/25/2014  . PAROTIDECTOMY Right 04/25/2014   Sacrifice of the facial nerve  . Partial auriculectomy Right 04/25/2014  . RIGHT HEART CATHETERIZATION N/A 03/03/2014   Procedure: RIGHT HEART CATH;  Surgeon: [name], MD;  Location: [facility] CATH LAB;  Service: Cardiovascular;  Laterality: N/A;  . TILT TABLE STUDY N/A 05/06/2013   Procedure: TILT TABLE STUDY;  Surgeon: [name], MD;  Location: [facility] CATH LAB;  Service: Cardiovascular;  Laterality: N/A;  . TONSILLECTOMY      Family History  Problem Relation Age of Onset  . Colon cancer Mother 21  . Heart attack Father 75  . Heart attack Maternal Grandmother 60  . Uterine cancer Paternal Grandmother   . Healthy Sister   . Stroke Neg Hx   . Diabetes Neg Hx     Social History:  reports that he has never smoked. He has never used smokeless tobacco. He reports that he does not drink alcohol or use drugs.  Allergies:  Allergies  Allergen Reactions  . Plavix [Clopidogrel] Other (See Comments)    Cannot take med    Medications: I have reviewed the patient's current medications.  Results for orders placed or performed during the hospital encounter of 02/11/16 (from the past 48 hour(s))  CBC with Differential     Status: None   Collection Time: 02/11/16  4:05 PM  Result Value Ref Range   WBC 7.6 4.0 - 10.5 K/uL   RBC 4.53 4.22 - 5.81 MIL/uL   Hemoglobin 14.4 13.0 - 17.0 g/dL   HCT 43.8 39.0 - 52.0 %   MCV 96.7 78.0 - 100.0 fL   MCH 31.8 26.0 - 34.0 pg   MCHC 32.9 30.0 - 36.0 g/dL   RDW 14.6 11.5 - 15.5 %   Platelets 201 150 - 400 K/uL   Neutrophils Relative % 85 %   Neutro Abs 6.5 1.7 - 7.7 K/uL   Lymphocytes Relative 9 %   Lymphs Abs 0.7 0.7 - 4.0 K/uL   Monocytes Relative 4 %   Monocytes Absolute 0.3 0.1 - 1.0 K/uL    Eosinophils Relative 2 %   Eosinophils Absolute 0.1 0.0 - 0.7 K/uL   Basophils Relative 0 %   Basophils Absolute 0.0 0.0 - 0.1 K/uL  Basic metabolic panel     Status: Abnormal   Collection Time: 02/11/16  4:05 PM  Result Value Ref Range   Sodium 135 135 - 145 mmol/L   Potassium 4.2 3.5 - 5.1 mmol/L   Chloride 100 (L) 101 - 111 mmol/L   CO2 28 22 - 32 mmol/L   Glucose, Bld 100 (  H) 65 - 99 mg/dL   BUN 15 6 - 20 mg/dL   Creatinine, Ser 0.90 0.61 - 1.24 mg/dL   Calcium 8.9 8.9 - 10.3 mg/dL   GFR calc non Af Amer >60 >60 mL/min   GFR calc Af Amer >60 >60 mL/min    Comment: (NOTE) The eGFR has been calculated using the CKD EPI equation. This calculation has not been validated in all clinical situations. eGFR's persistently <60 mL/min signify possible Chronic Kidney Disease.    Anion gap 7 5 - 15  Type and screen [place]     Status: None   Collection Time: 02/11/16  8:13 PM  Result Value Ref Range   ABO/RH(D) A NEG    Antibody Screen NEG    Sample Expiration 02/14/2016   Protime-INR     Status: None   Collection Time: 02/11/16  9:10 PM  Result Value Ref Range   Prothrombin Time 13.1 11.4 - 15.2 seconds   INR 0.99   APTT     Status: None   Collection Time: 02/11/16  9:10 PM  Result Value Ref Range   aPTT 30 24 - 36 seconds  Surgical PCR screen     Status: None   Collection Time: 02/12/16  1:49 AM  Result Value Ref Range   MRSA, PCR NEGATIVE NEGATIVE   Staphylococcus aureus NEGATIVE NEGATIVE    Comment:        The Xpert SA Assay (FDA approved for NASAL specimens in patients over 38 years of age), is one component of a comprehensive surveillance program.  Test performance has been validated by [facility] for patients greater than or equal to 67 year old. It is not intended to diagnose infection nor to guide or monitor treatment.    *Note: Due to a large number of results and/or encounters for the requested time period, some results have not been  displayed. A complete set of results can be found in Results Review.    Dg Chest 1 View  Result Date: 02/11/2016 CLINICAL DATA:  Status post fall last night from standing position. Patient undergoing evaluation of the left hip fracture. Preoperative study. EXAM: CHEST 1 VIEW COMPARISON:  Chest x-ray of Oct 28, 2015 FINDINGS: The patient is rotated on both images. The lungs are adequately inflated and clear. There is no pleural effusion or pneumothorax. There is a prosthetic valve ring in the mitral position. The heart is normal in size. The pulmonary vascularity is not engorged. There is calcification in the wall of the aortic arch. The observed bony thorax exhibits no acute abnormality. There are degenerative changes of both shoulders. IMPRESSION: There is no pneumonia nor CHF nor other acute cardiopulmonary abnormality. There is a prosthetic mitral valve. Aortic atherosclerosis. Electronically Signed   By: David  Martinique M.D.   On: 02/11/2016 16:52   Dg Pelvis 1-2 Views  Result Date: 02/11/2016 CLINICAL DATA:  Fall last night, left hip pain EXAM: PELVIS - 1-2 VIEW COMPARISON:  None. FINDINGS: Single frontal view of the pelvis submitted. Extensive degenerative changes are noted lower lumbar spine. There is shortening of left femoral neck with a vague lucent line highly suspicious for impacted fracture. Clinical correlation is necessary. Further correlation with CT scan or MRI is recommended. There is asymmetry of left superior and inferior pubic ramus. Although this may be positional impacted fracture cannot be excluded. Further correlation with CT scan is recommended. IMPRESSION: Extensive degenerative changes are noted lower lumbar spine. There is shortening of left  femoral neck with a vague lucent line highly suspicious for impacted fracture. Clinical correlation is necessary. Further correlation with CT scan or MRI is recommended. There is asymmetry of left superior and inferior pubic ramus. Although this  may be positional impacted fracture cannot be excluded. Further correlation with CT scan is recommended. Electronically Signed   By: Lahoma Crocker M.D.   On: 02/11/2016 16:55   Mr Hip Left Wo Contrast  Result Date: 02/11/2016 CLINICAL DATA:  Left femoral neck fracture. EXAM: MR OF THE LEFT HIP WITHOUT CONTRAST TECHNIQUE: Multiplanar, multisequence MR imaging was performed. No intravenous contrast was administered. COMPARISON:  02/11/2016 FINDINGS: Bones: Nondisplaced left femoral neck fracture with associated marrow edema. Cortical discontinuity is more obvious laterally rather than medially. Lower lumbar spondylosis and degenerative disc disease. Articular cartilage and labrum Articular cartilage:  Mild chondral thinning in both hips. Labrum:  Grossly unremarkable Joint or bursal effusion Joint effusion:  Moderate left hip joint effusion. Bursae:  Small amounts of fluid deep to both iliacus muscles. Muscles and tendons Muscles and tendons: Low-level edema in the soft tissues around the left hip joint. Iliopsoas tendons normal and symmetric. Other findings Miscellaneous: Sigmoid colon diverticulosis without active diverticulitis. Left scrotal hydrocele potentially up to the 130 cc. IMPRESSION: 1. Nondisplaced acute left femoral neck fracture with associated marrow edema and hip effusion. 2. Left scrotal hydrocele potentially up to 130 cc in volume. 3. Trace fluid deep to both iliacus muscles. 4. Mild chondral thinning in both hips. 5. Lower lumbar spondylosis and degenerative disc disease. 6. Sigmoid colon diverticulosis. Electronically Signed   By: Van Clines M.D.   On: 02/11/2016 23:45   Dg Femur Min 2 Views Left  Result Date: 02/11/2016 CLINICAL DATA:  Left hip pain after fall at nursing facility. EXAM: LEFT FEMUR 2 VIEWS COMPARISON:  None. FINDINGS: Almyra Brace is seen in left femoral neck concerning for nondisplaced fracture. MRI may be performed for further evaluation. Remaining portion of left femur  appears normal. IMPRESSION: Linear lucency seen in left femoral neck concerning for nondisplaced fracture. MRI may be performed for further evaluation. Electronically Signed   By: Marijo Conception, M.D.   On: 02/11/2016 16:54    Review of Systems  Constitutional: Negative.   HENT: Negative.   Eyes: Negative.   Respiratory: Negative.   Cardiovascular: Negative.   Gastrointestinal: Negative.   Genitourinary: Negative.   Musculoskeletal: Positive for falls and joint pain.  Skin: Negative.   Neurological: Negative.   Endo/Heme/Allergies: Negative.   Psychiatric/Behavioral: Negative.    Blood pressure 122/66, pulse 63, temperature 98.7 F (37.1 C), temperature source Oral, resp. rate 16, height 5' 10"  (1.778 m), weight 68 kg (150 lb), SpO2 95 %. Physical Exam  Constitutional: He is oriented to person, place, and time. He appears well-developed.  HENT:  Head: Atraumatic.  Eyes: EOM are normal.  Neck: Normal range of motion.  Cardiovascular: Intact distal pulses.   Respiratory: Effort normal.  GI: Soft.  Genitourinary:  Genitourinary Comments: Deferred  Musculoskeletal:  Bucks traction LLE inplace. 2+ pedal pulse. Plantar and dorsi flexion intact. Tender over left hip region. Mild edema.  Neurological: He is alert and oriented to person, place, and time.  Skin: Skin is warm and dry.  Psychiatric: His behavior is normal.    Assessment/Plan: Left non-displaced femoral neck fracture: Bucks traction NPO MN, diet today Bedrest Admitted to medicine SCDs right leg Plan is for Dr. Alvan Dame to take to [place], Ilya Ess L 02/12/2016, 7:36 AM

## 2016-02-12 NOTE — Progress Notes (Signed)
Initial Nutrition Assessment  DOCUMENTATION CODES:   Non-severe (moderate) malnutrition in context of chronic illness  INTERVENTION:  Provide Ensure Enlive po BID, each supplement provides 350 kcal and 20 grams of protein.  Encourage adequate PO intake.   NUTRITION DIAGNOSIS:   Malnutrition related to chronic illness as evidenced by moderate depletion of body fat, severe depletion of muscle mass.  GOAL:   Patient will meet greater than or equal to 90% of their needs  MONITOR:   PO intake, Supplement acceptance, Labs, Weight trends, Skin, I & O's  REASON FOR ASSESSMENT:   Consult Assessment of nutrition requirement/status  ASSESSMENT:   82 y.o. 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 Plans for surgery tomorrow.  Meal completion has been 10%. Pt reports having a lack of appetite which has been ongoing over the past 18 months which he associated is from when he had radiation therapy. Pt reports still trying to consuming at least 2-3 meals a day at home. Per Epic weight records, pt with no recent significant weight loss. Pt reports he had consumed Ensure in the past while undergoing his radiation, however had stopped 6 months after the therapy. Pt is agreeable to RD ordering Ensure while pt is hospitalized to aid in caloric and protein needs. RD to order.   Nutrition-Focused physical exam completed. Findings are moderate fat depletion, severe muscle depletion, and no edema.   Labs and medications reviewed.   Diet Order:  Diet NPO time specified Diet Heart Room service appropriate? Yes; Fluid consistency: Thin  Skin:  Reviewed, no issues  Last BM:  9/6  Height:    Ht Readings from Last 1 Encounters:   02/11/16 5\' 10"  (1.778 m)    Weight:   Wt Readings from Last 1 Encounters:  02/11/16 150 lb (68 kg)    Ideal Body Weight:  75.45 kg  BMI:  Body mass index is 21.52 kg/m.  Estimated Nutritional Needs:   Kcal:  1800-1950  Protein:  75-85 grams  Fluid:  1.8 - 1.9 L/day  EDUCATION NEEDS:   No education needs identified at this time  Corrin Parker, MS, RD, LDN Pager # [phone] After hours/ weekend pager # [phone]

## 2016-02-12 NOTE — Progress Notes (Signed)
OT Cancellation Note  Patient Details Name: Adrian West MRN: [id number] DOB: 1928-03-23   Cancelled Treatment:    Reason Eval/Treat Not Completed: Medical issues which prohibited therapy (Pt in traction with bedrest orders. Plan for OR 02/13/16.)  Malka So 02/12/2016, 8:22 AM

## 2016-02-12 NOTE — Progress Notes (Signed)
[facility] PROGRESS NOTE  Adrian West [id number] DOB: 1927-08-08 DOA: 02/11/2016  PCP: [name], MD  Brief HPI: 82 year old Caucasian male with a past medical history of hypertension, GERD, hypothyroidism, TIA, MGUS, history of salivary gland malignancy, right-sided Bell's palsy, interstitial lung disease who presented after a fall, complaining of left hip pain. Patient was found to have a hip fracture and was hospitalized for further management.  Past medical history:  Past Medical History:  Diagnosis Date  . Anorexia 08/16/2014  . Bell's palsy   . Benign neoplasm of colon   . 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 Deaf   . Diaphragm paralysis   . Diverticulosis   . DNR (do not resuscitate) discussion 07/22/2014  . Dyspnea and respiratory abnormality 03/10/2014  . 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 Interstitial lung disease 10/15/13 Xray [facility]) 01/02/12 new subsegmental ATX on L (vs 09/03/11) Seeing Dr Theda Sers [facility]  Seen by Dr Corrin Parker, [facility],  02/2011. Component of thoracic height loss & alcohol related neuropathy questioned. No plication recommended  5/4- 10/14/2013 @ [facility] ; Dr  Ellis Parents.10/15/13 new focal consolidation mid anterior L lung:aspiration vs PNA . Underlying interstitial lung disease.10/15/13 Rx sent to [place] for Doxycycline & Augmentin by Dr Jeannine Kitten ( not picked up as of 10/18/13).  Flew back to [facility]; seen @ 10/16/13 [facility], [place]. Levaquin X 5 days , Dr Evalee Mutton    . Esophageal stricture   . Gait disturbance   . GERD 02/14/2008   Qualifier: Diagnosis of  By: [name] MD, [name]    . Hiatal hernia   . History of radiation therapy 06/11/14-07/22/14   skin cancer of scalp/face  . HYPERTENSION 02/14/2008       .  HYPOTHYROIDISM 02/14/2008  . Interstitial lung disease ([place]) 05/05/2014  . Loss of weight 07/22/2014  . Lung abnormality 11/25/2008   "nerve damage from heart surgery"  . Memory loss 12/31/2009   Qualifier: Diagnosis of  By: [name] MD, [name]    . MGUS (monoclonal gammopathy of unknown significance) 04/11/2013  . MITRAL REGURGITATION 02/14/2008   Qualifier: Diagnosis of  By: [name] MD, Cherlynn Kaiser MV surgery 11/2008, [name].complcated by paralysis of diaphragm     . 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 Neuropathy, peripheral ([place])   . Other and unspecified hyperlipidemia 02/24/2012  . Polycythemia, secondary 01/10/2014  . PVC's (premature ventricular contractions) 04/05/2011  . Radiation 06/11/14- 07/22/14   Right parotid bed, posterior auricular, right upper neck.  . 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 Skin cancer of scalp or skin of neck 05/21/2014  . SPINAL STENOSIS, LUMBAR 07/28/2010   Qualifier: Diagnosis of  By: [name] MD, [name], NS ( retired)    . Squamous carcinoma ([place])   . TIA (transient ischemic attack) May 2012  . Tremor 2015  . Tremor, essential   . Vertigo 2005  . Vertigo 2005  . Weakness generalized 07/22/2014    Consultants: Orthopedics  Procedures: None yet  Antibiotics: None  Subjective: Patient states that he is not in pain at this time.  Denies any chest pain, shortness of breath, nausea or vomiting.  Objective:  Vital Signs  Vitals:   02/11/16 2036 02/11/16 2100 02/11/16 2300 02/12/16 0446  BP:  110/77 123/71 122/66  Pulse:  77 74 63  Resp:  24 18 16   Temp:   98.2 F (36.8 C) 98.7 F (37.1 C)  TempSrc:   Oral Oral  SpO2: 100% 95% 94% 95%  Weight:      Height:        Intake/Output Summary (Last 24 hours) at 02/12/16 1141 Last data filed  at 02/12/16 [id number]  Gross per 24 hour  Intake                0 ml  Output              400 ml  Net             -400 ml   Filed Weights   02/11/16 1544  Weight: 68 kg (150 lb)    General appearance: alert, cooperative, appears stated age and no distress Resp: clear to auscultation bilaterally Cardio: regular rate and rhythm, S1, S2 normal, no murmur, click, rub or gallop GI: soft, non-tender; bowel sounds normal; no masses,  no organomegaly Extremities: Left leg is in a traction Neurologic: Alert and oriented X 3, no focal neurological deficit  Lab Results:  Data Reviewed: I have personally reviewed following labs and imaging studies  CBC:  Recent Labs Lab 02/11/16 1605  WBC 7.6  NEUTROABS 6.5  HGB 14.4  HCT 43.8  MCV 96.7  PLT 123456   Basic Metabolic Panel:  Recent Labs Lab 02/11/16 1605  NA 135  K 4.2  CL 100*  CO2 28  GLUCOSE 100*  BUN 15  CREATININE 0.90  CALCIUM 8.9   GFR: Estimated Creatinine Clearance: 57.7 mL/min (by C-G formula based on SCr of 0.9 mg/dL).  Coagulation Profile:  Recent Labs Lab 02/11/16 2110  INR 0.99   Urine analysis:    Component Value Date/Time   COLORURINE YELLOW 02/12/2016 0630   APPEARANCEUR CLEAR 02/12/2016 0630   LABSPEC 1.015 02/12/2016 0630   PHURINE 6.5 02/12/2016 0630   GLUCOSEU NEGATIVE 02/12/2016 0630   HGBUR NEGATIVE 02/12/2016 0630   HGBUR negative 02/14/2008 0841   BILIRUBINUR NEGATIVE 02/12/2016 0630   BILIRUBINUR Neg 01/02/2012 1702   KETONESUR NEGATIVE 02/12/2016 0630   PROTEINUR NEGATIVE 02/12/2016 0630   UROBILINOGEN 0.2 10/27/2014 1214   NITRITE NEGATIVE 02/12/2016 0630   LEUKOCYTESUR NEGATIVE 02/12/2016 0630    Recent Results (from the past 240 hour(s))  Surgical PCR screen     Status: None   Collection Time: 02/12/16  1:49 AM  Result Value Ref Range Status   MRSA, PCR NEGATIVE NEGATIVE Final   Staphylococcus aureus NEGATIVE NEGATIVE Final    Comment:        The Xpert SA Assay  (FDA approved for NASAL specimens in patients over 39 years of age), is one component of a comprehensive surveillance program.  Test performance has been validated by [facility] for patients greater than or equal to 39 year old. It is not intended to diagnose infection nor to guide or monitor treatment.       Radiology Studies: Dg Chest 1 View  Result Date: 02/11/2016 CLINICAL DATA:  Status post fall last night from standing position. Patient undergoing evaluation of the left hip fracture. Preoperative study. EXAM: CHEST 1 VIEW COMPARISON:  Chest x-ray of Oct 28, 2015 FINDINGS: The patient is rotated on  both images. The lungs are adequately inflated and clear. There is no pleural effusion or pneumothorax. There is a prosthetic valve ring in the mitral position. The heart is normal in size. The pulmonary vascularity is not engorged. There is calcification in the wall of the aortic arch. The observed bony thorax exhibits no acute abnormality. There are degenerative changes of both shoulders. IMPRESSION: There is no pneumonia nor CHF nor other acute cardiopulmonary abnormality. There is a prosthetic mitral valve. Aortic atherosclerosis. Electronically Signed   By: David  Martinique M.D.   On: 02/11/2016 16:52   Dg Pelvis 1-2 Views  Result Date: 02/11/2016 CLINICAL DATA:  Fall last night, left hip pain EXAM: PELVIS - 1-2 VIEW COMPARISON:  None. FINDINGS: Single frontal view of the pelvis submitted. Extensive degenerative changes are noted lower lumbar spine. There is shortening of left femoral neck with a vague lucent line highly suspicious for impacted fracture. Clinical correlation is necessary. Further correlation with CT scan or MRI is recommended. There is asymmetry of left superior and inferior pubic ramus. Although this may be positional impacted fracture cannot be excluded. Further correlation with CT scan is recommended. IMPRESSION: Extensive degenerative changes are noted lower lumbar spine.  There is shortening of left femoral neck with a vague lucent line highly suspicious for impacted fracture. Clinical correlation is necessary. Further correlation with CT scan or MRI is recommended. There is asymmetry of left superior and inferior pubic ramus. Although this may be positional impacted fracture cannot be excluded. Further correlation with CT scan is recommended. Electronically Signed   By: Lahoma Crocker M.D.   On: 02/11/2016 16:55   Mr Hip Left Wo Contrast  Result Date: 02/11/2016 CLINICAL DATA:  Left femoral neck fracture. EXAM: MR OF THE LEFT HIP WITHOUT CONTRAST TECHNIQUE: Multiplanar, multisequence MR imaging was performed. No intravenous contrast was administered. COMPARISON:  02/11/2016 FINDINGS: Bones: Nondisplaced left femoral neck fracture with associated marrow edema. Cortical discontinuity is more obvious laterally rather than medially. Lower lumbar spondylosis and degenerative disc disease. Articular cartilage and labrum Articular cartilage:  Mild chondral thinning in both hips. Labrum:  Grossly unremarkable Joint or bursal effusion Joint effusion:  Moderate left hip joint effusion. Bursae:  Small amounts of fluid deep to both iliacus muscles. Muscles and tendons Muscles and tendons: Low-level edema in the soft tissues around the left hip joint. Iliopsoas tendons normal and symmetric. Other findings Miscellaneous: Sigmoid colon diverticulosis without active diverticulitis. Left scrotal hydrocele potentially up to the 130 cc. IMPRESSION: 1. Nondisplaced acute left femoral neck fracture with associated marrow edema and hip effusion. 2. Left scrotal hydrocele potentially up to 130 cc in volume. 3. Trace fluid deep to both iliacus muscles. 4. Mild chondral thinning in both hips. 5. Lower lumbar spondylosis and degenerative disc disease. 6. Sigmoid colon diverticulosis. Electronically Signed   By: Van Clines M.D.   On: 02/11/2016 23:45   Dg Femur Min 2 Views Left  Result Date:  02/11/2016 CLINICAL DATA:  Left hip pain after fall at nursing facility. EXAM: LEFT FEMUR 2 VIEWS COMPARISON:  None. FINDINGS: Almyra Brace is seen in left femoral neck concerning for nondisplaced fracture. MRI may be performed for further evaluation. Remaining portion of left femur appears normal. IMPRESSION: Linear lucency seen in left femoral neck concerning for nondisplaced fracture. MRI may be performed for further evaluation. Electronically Signed   By: Marijo Conception, M.D.   On: 02/11/2016 16:54     Medications:  Scheduled: . aspirin EC  81 mg Oral Daily  .  levothyroxine  125 mcg Oral QAC breakfast   Continuous:  [id number] sodium, methocarbamol **OR** methocarbamol (ROBAXIN)  IV, morphine injection, oxyCODONE-acetaminophen  Assessment/Plan:  Principal Problem:   Fracture of femoral neck, left (HCC) Active Problems:   Hypothyroidism   Essential hypertension   TIA (transient ischemic attack)    Fracture of left femoral neck Patient has been seen by orthopedics. MRI was ordered which did show a fracture. Plan is for surgical intervention tomorrow. Patient has a history of mitral valve repair. He has no history of carotid artery disease. He had a negative stress test in 2015 and had an echocardiogram last month, which did not show any wall motion abnormalities. Ejection fraction was normal. No other significant valvular heart disease noted. Patient does not get any chest discomfort with usual activities. In view of the negative recent cardiac evaluation, patient may proceed to surgery without further testing. His estimated risk probability for perioperative myocardial infarction or cardiac arrest is 1.72% based on the [facility] perioperative cardiac risk index. Pain control.  Hypothyroidism Continue home Synthroid  Essential hypertension  Continue to monitor blood pressures. Not on any antihypertensives. He was on a beta blocker based on recent cardiology notes, however, was taken off  due to issues with orthostatic hypotension.  TIA (transient ischemic attack) No acute issues. Continue aspirin  DVT Prophylaxis: Definitive DVT prophylaxis after surgery.    Code Status: DO NOT RESUSCITATE  Family Communication: Discussed with the patient  Disposition Plan: Await surgical intervention. He is stable at this time.    LOS: 1 day   [facility] Pager [phone] 02/12/2016, 11:41 AM  If 7PM-7AM, please contact night-coverage at [email or web address], password [email or web address]

## 2016-02-12 NOTE — Telephone Encounter (Signed)
I called and spoke to Mrs. Esty about Adrian West appointment with Dr. Isidore Moos today. She tells me that he is currently in the hospital after breaking his hip. He will not be here today. I offered my support and told her that if she needed anything to call us in Radiation Oncology.

## 2016-02-12 NOTE — Progress Notes (Signed)
PT Cancellation Note  Patient Details Name: MACKAY TEUFEL MRN: [id number] DOB: 02-14-1928   Cancelled Treatment:    Reason Eval/Treat Not Completed: Medical issues which prohibited therapy. Pt in bucks traction and on bedrest,  awaiting surgery.    Cassell Clement, PT, CSCS Pager [phone] Office [phone]  02/12/2016, 9:01 AM

## 2016-02-13 ENCOUNTER — Encounter (HOSPITAL_COMMUNITY)
Admission: EM | Disposition: A | Discharge status: Skilled Nursing Facility | Payer: Self-pay | Source: Home / Self Care | Attending: Internal Medicine

## 2016-02-13 ENCOUNTER — Inpatient Hospital Stay (HOSPITAL_COMMUNITY): Payer: Medicare Other | Admitting: Anesthesiology

## 2016-02-13 ENCOUNTER — Inpatient Hospital Stay (HOSPITAL_COMMUNITY): Payer: Medicare Other

## 2016-02-13 DIAGNOSIS — I10 Essential (primary) hypertension: Secondary | ICD-10-CM

## 2016-02-13 HISTORY — PX: HIP PINNING,CANNULATED: SHX1758

## 2016-02-13 LAB — CBC
HCT: 43.4 % (ref 39.0–52.0)
HEMOGLOBIN: 14.2 g/dL (ref 13.0–17.0)
MCH: 32.1 pg (ref 26.0–34.0)
MCHC: 32.7 g/dL (ref 30.0–36.0)
MCV: 98.2 fL (ref 78.0–100.0)
PLATELETS: 183 10*3/uL (ref 150–400)
RBC: 4.42 MIL/uL (ref 4.22–5.81)
RDW: 14.8 % (ref 11.5–15.5)
WBC: 6.4 10*3/uL (ref 4.0–10.5)

## 2016-02-13 LAB — CREATININE, SERUM
CREATININE: 0.66 mg/dL (ref 0.61–1.24)
GFR calc Af Amer: >60 mL/min (ref >60)

## 2016-02-13 SURGERY — FIXATION, FEMUR, NECK, PERCUTANEOUS, USING SCREW
Anesthesia: Monitor Anesthesia Care | Site: Hip | Laterality: Left

## 2016-02-13 MED ORDER — KETOROLAC TROMETHAMINE 0.5 % OP SOLN
1.0000 [drp] | Freq: Four times a day (QID) | OPHTHALMIC | Status: AC
  Administered 2016-02-13 – 2016-02-15 (×7): 1 [drp] via OPHTHALMIC
  Filled 2016-02-13: qty 3

## 2016-02-13 MED ORDER — ONDANSETRON HCL 4 MG PO TABS: 4.0000 mg | ORAL_TABLET | Freq: Four times a day (QID) | ORAL | Status: DC | PRN

## 2016-02-13 MED ORDER — MORPHINE SULFATE (PF) 2 MG/ML IV SOLN: 0.5000 mg | INTRAVENOUS | Status: DC | PRN

## 2016-02-13 MED ORDER — POLYETHYLENE GLYCOL 3350 17 G PO PACK: 17.0000 g | PACK | Freq: Every day | ORAL | Status: DC | PRN

## 2016-02-13 MED ORDER — PHENOL 1.4 % MT LIQD: 1.0000 | OROMUCOSAL | Status: DC | PRN

## 2016-02-13 MED ORDER — POVIDONE-IODINE 10 % EX SWAB: 2.0000 "application " | Freq: Once | CUTANEOUS | Status: DC

## 2016-02-13 MED ORDER — PHENYLEPHRINE HCL 10 MG/ML IJ SOLN
INTRAMUSCULAR | Status: DC | PRN
  Administered 2016-02-13 (×2): 80 ug via INTRAVENOUS

## 2016-02-13 MED ORDER — PROPOFOL 500 MG/50ML IV EMUL
INTRAVENOUS | Status: DC | PRN
  Administered 2016-02-13: 15 ug/kg/min via INTRAVENOUS

## 2016-02-13 MED ORDER — CEFAZOLIN IN D5W 1 GM/50ML IV SOLN
1.0000 g | Freq: Four times a day (QID) | INTRAVENOUS | Status: AC
  Administered 2016-02-14: 1 g via INTRAVENOUS
  Filled 2016-02-13 (×2): qty 50

## 2016-02-13 MED ORDER — FENTANYL CITRATE (PF) 100 MCG/2ML IJ SOLN: 25.0000 ug | INTRAMUSCULAR | Status: DC | PRN

## 2016-02-13 MED ORDER — PROPOFOL 10 MG/ML IV BOLUS
INTRAVENOUS | Status: DC | PRN
  Administered 2016-02-13: 30 mg via INTRAVENOUS

## 2016-02-13 MED ORDER — MENTHOL 3 MG MT LOZG: 1.0000 | LOZENGE | OROMUCOSAL | Status: DC | PRN

## 2016-02-13 MED ORDER — DIPHENHYDRAMINE HCL 50 MG/ML IJ SOLN
INTRAMUSCULAR | Status: DC | PRN
  Administered 2016-02-13: 25 mg via INTRAVENOUS

## 2016-02-13 MED ORDER — 0.9 % SODIUM CHLORIDE (POUR BTL) OPTIME
TOPICAL | Status: DC | PRN
  Administered 2016-02-13: 1000 mL

## 2016-02-13 MED ORDER — ALUM & MAG HYDROXIDE-SIMETH 200-200-20 MG/5ML PO SUSP: 30.0000 mL | ORAL | Status: DC | PRN

## 2016-02-13 MED ORDER — BUPIVACAINE IN DEXTROSE 0.75-8.25 % IT SOLN
INTRATHECAL | Status: DC | PRN
  Administered 2016-02-13: 12 mg via INTRATHECAL

## 2016-02-13 MED ORDER — FENTANYL CITRATE (PF) 100 MCG/2ML IJ SOLN
INTRAMUSCULAR | Status: DC | PRN
  Administered 2016-02-13: 50 ug via INTRAVENOUS

## 2016-02-13 MED ORDER — SODIUM CHLORIDE 0.9 % IV SOLN: INTRAVENOUS | Status: DC

## 2016-02-13 MED ORDER — CEFAZOLIN SODIUM-DEXTROSE 2-4 GM/100ML-% IV SOLN
2.0000 g | INTRAVENOUS | Status: AC
  Administered 2016-02-13: 2 g via INTRAVENOUS

## 2016-02-13 MED ORDER — METOCLOPRAMIDE HCL 5 MG/ML IJ SOLN: 5.0000 mg | Freq: Three times a day (TID) | INTRAMUSCULAR | Status: DC | PRN

## 2016-02-13 MED ORDER — ACETAMINOPHEN 325 MG PO TABS: 650.0000 mg | ORAL_TABLET | Freq: Four times a day (QID) | ORAL | Status: DC | PRN

## 2016-02-13 MED ORDER — METOCLOPRAMIDE HCL 5 MG PO TABS: 5.0000 mg | ORAL_TABLET | Freq: Three times a day (TID) | ORAL | Status: DC | PRN

## 2016-02-13 MED ORDER — FENTANYL CITRATE (PF) 100 MCG/2ML IJ SOLN
INTRAMUSCULAR | Status: AC
  Filled 2016-02-13: qty 4

## 2016-02-13 MED ORDER — CEFAZOLIN SODIUM-DEXTROSE 2-4 GM/100ML-% IV SOLN
INTRAVENOUS | Status: AC
  Filled 2016-02-13: qty 100

## 2016-02-13 MED ORDER — ACETAMINOPHEN 650 MG RE SUPP: 650.0000 mg | Freq: Four times a day (QID) | RECTAL | Status: DC | PRN

## 2016-02-13 MED ORDER — METHOCARBAMOL 1000 MG/10ML IJ SOLN
500.0000 mg | Freq: Four times a day (QID) | INTRAVENOUS | Status: DC | PRN
  Filled 2016-02-13: qty 5

## 2016-02-13 MED ORDER — PROPOFOL 10 MG/ML IV BOLUS
INTRAVENOUS | Status: AC
  Filled 2016-02-13: qty 20

## 2016-02-13 MED ORDER — PHENYLEPHRINE HCL 10 MG/ML IJ SOLN
INTRAVENOUS | Status: DC | PRN
  Administered 2016-02-13: 20 ug/min via INTRAVENOUS

## 2016-02-13 MED ORDER — LACTATED RINGERS IV SOLN
INTRAVENOUS | Status: DC
  Administered 2016-02-13: 12:00:00 via INTRAVENOUS

## 2016-02-13 MED ORDER — ENOXAPARIN SODIUM 40 MG/0.4ML ~~LOC~~ SOLN
40.0000 mg | SUBCUTANEOUS | Status: DC
  Administered 2016-02-14 – 2016-02-15 (×2): 40 mg via SUBCUTANEOUS
  Filled 2016-02-13 (×2): qty 0.4

## 2016-02-13 MED ORDER — DOCUSATE SODIUM 100 MG PO CAPS
100.0000 mg | ORAL_CAPSULE | Freq: Two times a day (BID) | ORAL | Status: DC
  Administered 2016-02-13 – 2016-02-15 (×4): 100 mg via ORAL
  Filled 2016-02-13 (×3): qty 1

## 2016-02-13 MED ORDER — METHOCARBAMOL 500 MG PO TABS: 500.0000 mg | ORAL_TABLET | Freq: Four times a day (QID) | ORAL | Status: DC | PRN

## 2016-02-13 MED ORDER — HYDROCODONE-ACETAMINOPHEN 5-325 MG PO TABS
1.0000 | ORAL_TABLET | Freq: Four times a day (QID) | ORAL | Status: DC | PRN
  Administered 2016-02-13 – 2016-02-14 (×3): 2 via ORAL
  Filled 2016-02-13 (×3): qty 2

## 2016-02-13 MED ORDER — LIDOCAINE HCL (CARDIAC) 20 MG/ML IV SOLN
INTRAVENOUS | Status: DC | PRN
  Administered 2016-02-13: 40 mg via INTRAVENOUS

## 2016-02-13 MED ORDER — ONDANSETRON HCL 4 MG/2ML IJ SOLN: 4.0000 mg | Freq: Four times a day (QID) | INTRAMUSCULAR | Status: DC | PRN

## 2016-02-13 SURGICAL SUPPLY — 31 items
BIT DRILL 4.8X300 (BIT) ×1 IMPLANT
BIT DRILL 4.8X300MM (BIT) ×1
COVER PERINEAL POST (MISCELLANEOUS) ×3 IMPLANT
COVER SURGICAL LIGHT HANDLE (MISCELLANEOUS) ×3 IMPLANT
DRAPE STERI IOBAN 125X83 (DRAPES) ×3 IMPLANT
DRAPE SURG 17X23 STRL (DRAPES) ×4 IMPLANT
DRSG MEPILEX BORDER 4X4 (GAUZE/BANDAGES/DRESSINGS) ×3 IMPLANT
DURAPREP 26ML APPLICATOR (WOUND CARE) ×3 IMPLANT
ELECT REM PT RETURN 9FT ADLT (ELECTROSURGICAL) ×3
ELECTRODE REM PT RTRN 9FT ADLT (ELECTROSURGICAL) ×1 IMPLANT
GLOVE BIOGEL PI IND STRL 7.5 (GLOVE) ×1 IMPLANT
GLOVE BIOGEL PI INDICATOR 7.5 (GLOVE) ×2
GLOVE ORTHO TXT STRL SZ7.5 (GLOVE) ×3 IMPLANT
GOWN STRL REUS W/ TWL LRG LVL3 (GOWN DISPOSABLE) ×3 IMPLANT
GOWN STRL REUS W/TWL LRG LVL3 (GOWN DISPOSABLE) ×9
KIT BASIN OR (CUSTOM PROCEDURE TRAY) ×3 IMPLANT
KIT ROOM TURNOVER OR (KITS) ×3 IMPLANT
LIQUID BAND (GAUZE/BANDAGES/DRESSINGS) ×2 IMPLANT
MANIFOLD NEPTUNE II (INSTRUMENTS) ×3 IMPLANT
NS IRRIG 1000ML POUR BTL (IV SOLUTION) ×3 IMPLANT
PACK GENERAL/GYN (CUSTOM PROCEDURE TRAY) ×3 IMPLANT
PAD ARMBOARD 7.5X6 YLW CONV (MISCELLANEOUS) ×6 IMPLANT
PIN GUIDE DRILL TIP 2.8X300 (DRILL) ×6 IMPLANT
SCREW CANN 8.0X100MM (Screw) ×2 IMPLANT
SCREW CANN 8.0X95 HIP (Screw) ×2 IMPLANT
SCREW CANNULATED 8.0X90MM (Screw) ×2 IMPLANT
STAPLER VISISTAT 35W (STAPLE) ×3 IMPLANT
SUT VIC AB 2-0 CT1 27 (SUTURE) ×3
SUT VIC AB 2-0 CT1 TAPERPNT 27 (SUTURE) ×1 IMPLANT
TOWEL OR 17X24 6PK STRL BLUE (TOWEL DISPOSABLE) ×3 IMPLANT
TOWEL OR 17X26 10 PK STRL BLUE (TOWEL DISPOSABLE) ×3 IMPLANT

## 2016-02-13 NOTE — Anesthesia Postprocedure Evaluation (Signed)
Anesthesia Post Note  Patient: Adrian West  Procedure(s) Performed: Procedure(s) (LRB): CANNULATED LEFT HIP PINNING (Left)  Patient location during evaluation: PACU Anesthesia Type: Spinal and MAC Level of consciousness: awake and alert Pain management: pain level controlled Vital Signs Assessment: post-procedure vital signs reviewed and stable Respiratory status: spontaneous breathing and respiratory function stable Cardiovascular status: blood pressure returned to baseline and stable Postop Assessment: spinal receding Anesthetic complications: no    Last Vitals:  Vitals:   02/13/16 1600 02/13/16 1615  BP: 108/68 114/64  Pulse: 74 (!) 34  Resp: 18 15  Temp:      Last Pain:  Vitals:   02/13/16 0950  TempSrc:   PainSc: 4                  Quetzal Meany,W. EDMOND

## 2016-02-13 NOTE — Progress Notes (Signed)
[facility] PROGRESS NOTE  Sue Keener Dugo [id number] DOB: 11-20-1927 DOA: 02/11/2016  PCP: [name], MD  Brief HPI: 82 year old Caucasian male with a past medical history of hypertension, GERD, hypothyroidism, TIA, MGUS, history of salivary gland malignancy, right-sided Bell's palsy, interstitial lung disease who presented after a fall, complaining of left hip pain. Patient was found to have a hip fracture and was hospitalized for further management.  Past medical history:  Past Medical History:  Diagnosis Date  . Anorexia 08/16/2014  . Bell's palsy   . Benign neoplasm of colon   . 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 Deaf   . Diaphragm paralysis   . Diverticulosis   . DNR (do not resuscitate) discussion 07/22/2014  . Dyspnea and respiratory abnormality 03/10/2014  . 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 Interstitial lung disease 10/15/13 Xray [facility]) 01/02/12 new subsegmental ATX on L (vs 09/03/11) Seeing Dr Theda Sers [facility]  Seen by Dr Corrin Parker, [facility],  02/2011. Component of thoracic height loss & alcohol related neuropathy questioned. No plication recommended  5/4- 10/14/2013 @ [facility] ; Dr  Ellis Parents.10/15/13 new focal consolidation mid anterior L lung:aspiration vs PNA . Underlying interstitial lung disease.10/15/13 Rx sent to [facility] for Doxycycline & Augmentin by Dr Jeannine Kitten ( not picked up as of 10/18/13).  Flew back to [facility]; seen @ 10/16/13 [facility], [place]. Levaquin X 5 days , Dr Evalee Mutton    . Esophageal stricture   . Gait disturbance   . GERD 02/14/2008   Qualifier: Diagnosis of  By: [name] MD, [name]    . Hiatal hernia   . History of radiation therapy 06/11/14-07/22/14   skin cancer of scalp/face  . HYPERTENSION 02/14/2008       .  HYPOTHYROIDISM 02/14/2008  . Interstitial lung disease ([place]) 05/05/2014  . Loss of weight 07/22/2014  . Lung abnormality 11/25/2008   "nerve damage from heart surgery"  . Memory loss 12/31/2009   Qualifier: Diagnosis of  By: [name] MD, [name]    . MGUS (monoclonal gammopathy of unknown significance) 04/11/2013  . MITRAL REGURGITATION 02/14/2008   Qualifier: Diagnosis of  By: [name] MD, Cherlynn Kaiser MV surgery 11/2008, [name].complcated by paralysis of diaphragm     . 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 Neuropathy, peripheral ([place])   . Other and unspecified hyperlipidemia 02/24/2012  . Polycythemia, secondary 01/10/2014  . PVC's (premature ventricular contractions) 04/05/2011  . Radiation 06/11/14- 07/22/14   Right parotid bed, posterior auricular, right upper neck.  . 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 Skin cancer of scalp or skin of neck 05/21/2014  . SPINAL STENOSIS, LUMBAR 07/28/2010   Qualifier: Diagnosis of  By: [name] MD, [name], NS ( retired)    . Squamous carcinoma ([place])   . TIA (transient ischemic attack) May 2012  . Tremor 2015  . Tremor, essential   . Vertigo 2005  . Vertigo 2005  . Weakness generalized 07/22/2014    Consultants: Orthopedics  Procedures: Plan on surgical intervention today to his left hip  Antibiotics: None  Subjective: Patient feels well. He  slightly hard of hearing but does have a hearing aid. Denies any significant pain in his left hip. No chest pain, shortness of breath, nausea or vomiting.   Objective:  Vital Signs  Vitals:   02/12/16 0446 02/12/16 1322 02/12/16 2058 02/13/16 0550  BP: 122/66 111/82 140/71 123/79  Pulse: 63 72 73 (!) 45  Resp: 16 18    Temp: 98.7 F (37.1 C) 98.1 F (36.7 C) 98.2 F (36.8 C) 98.5 F (36.9 C)    TempSrc: Oral Oral Oral Oral  SpO2: 95% 97% 97% 92%  Weight:      Height:        Intake/Output Summary (Last 24 hours) at 02/13/16 0819 Last data filed at 02/12/16 2104  Gross per 24 hour  Intake              270 ml  Output              400 ml  Net             -130 ml   Filed Weights   02/11/16 1544  Weight: 68 kg (150 lb)    General appearance: alert, cooperative. Facial deformity noted because of previous surgeries. Resp: clear to auscultation bilaterally Cardio: regular rate and rhythm, S1, S2 normal, no murmur, click, rub or gallop GI: soft, non-tender; bowel sounds normal; no masses,  no organomegaly Extremities: Left leg is in a traction Neurologic: Alert and oriented X 3, no focal neurological deficit  Lab Results:  Data Reviewed: I have personally reviewed following labs and imaging studies  CBC:  Recent Labs Lab 02/11/16 1605  WBC 7.6  NEUTROABS 6.5  HGB 14.4  HCT 43.8  MCV 96.7  PLT 123456   Basic Metabolic Panel:  Recent Labs Lab 02/11/16 1605  NA 135  K 4.2  CL 100*  CO2 28  GLUCOSE 100*  BUN 15  CREATININE 0.90  CALCIUM 8.9   GFR: Estimated Creatinine Clearance: 57.7 mL/min (by C-G formula based on SCr of 0.9 mg/dL).  Coagulation Profile:  Recent Labs Lab 02/11/16 2110  INR 0.99     Recent Results (from the past 240 hour(s))  Surgical PCR screen     Status: None   Collection Time: 02/12/16  1:49 AM  Result Value Ref Range Status   MRSA, PCR NEGATIVE NEGATIVE Final   Staphylococcus aureus NEGATIVE NEGATIVE Final    Comment:        The Xpert SA Assay (FDA approved for NASAL specimens in patients over 51 years of age), is one component of a comprehensive surveillance program.  Test performance has been validated by [facility] for patients greater than or equal to 93 year old. It is not intended to diagnose infection nor to guide or monitor treatment.       Radiology Studies: Dg Chest 1 View  Result Date:  02/11/2016 CLINICAL DATA:  Status post fall last night from standing position. Patient undergoing evaluation of the left hip fracture. Preoperative study. EXAM: CHEST 1 VIEW COMPARISON:  Chest x-ray of Oct 28, 2015 FINDINGS: The patient is rotated on both images. The lungs are adequately inflated and clear. There is no pleural effusion or pneumothorax. There is a prosthetic valve ring in the mitral position. The heart is normal in size. The pulmonary vascularity is not engorged. There is calcification in the wall of the aortic arch. The observed bony thorax exhibits no acute abnormality. There are degenerative changes of both shoulders. IMPRESSION: There is no pneumonia  nor CHF nor other acute cardiopulmonary abnormality. There is a prosthetic mitral valve. Aortic atherosclerosis. Electronically Signed   By: David  Martinique M.D.   On: 02/11/2016 16:52   Dg Pelvis 1-2 Views  Result Date: 02/11/2016 CLINICAL DATA:  Fall last night, left hip pain EXAM: PELVIS - 1-2 VIEW COMPARISON:  None. FINDINGS: Single frontal view of the pelvis submitted. Extensive degenerative changes are noted lower lumbar spine. There is shortening of left femoral neck with a vague lucent line highly suspicious for impacted fracture. Clinical correlation is necessary. Further correlation with CT scan or MRI is recommended. There is asymmetry of left superior and inferior pubic ramus. Although this may be positional impacted fracture cannot be excluded. Further correlation with CT scan is recommended. IMPRESSION: Extensive degenerative changes are noted lower lumbar spine. There is shortening of left femoral neck with a vague lucent line highly suspicious for impacted fracture. Clinical correlation is necessary. Further correlation with CT scan or MRI is recommended. There is asymmetry of left superior and inferior pubic ramus. Although this may be positional impacted fracture cannot be excluded. Further correlation with CT scan is recommended.  Electronically Signed   By: Lahoma Crocker M.D.   On: 02/11/2016 16:55   Mr Hip Left Wo Contrast  Result Date: 02/11/2016 CLINICAL DATA:  Left femoral neck fracture. EXAM: MR OF THE LEFT HIP WITHOUT CONTRAST TECHNIQUE: Multiplanar, multisequence MR imaging was performed. No intravenous contrast was administered. COMPARISON:  02/11/2016 FINDINGS: Bones: Nondisplaced left femoral neck fracture with associated marrow edema. Cortical discontinuity is more obvious laterally rather than medially. Lower lumbar spondylosis and degenerative disc disease. Articular cartilage and labrum Articular cartilage:  Mild chondral thinning in both hips. Labrum:  Grossly unremarkable Joint or bursal effusion Joint effusion:  Moderate left hip joint effusion. Bursae:  Small amounts of fluid deep to both iliacus muscles. Muscles and tendons Muscles and tendons: Low-level edema in the soft tissues around the left hip joint. Iliopsoas tendons normal and symmetric. Other findings Miscellaneous: Sigmoid colon diverticulosis without active diverticulitis. Left scrotal hydrocele potentially up to the 130 cc. IMPRESSION: 1. Nondisplaced acute left femoral neck fracture with associated marrow edema and hip effusion. 2. Left scrotal hydrocele potentially up to 130 cc in volume. 3. Trace fluid deep to both iliacus muscles. 4. Mild chondral thinning in both hips. 5. Lower lumbar spondylosis and degenerative disc disease. 6. Sigmoid colon diverticulosis. Electronically Signed   By: Van Clines M.D.   On: 02/11/2016 23:45   Dg Femur Min 2 Views Left  Result Date: 02/11/2016 CLINICAL DATA:  Left hip pain after fall at nursing facility. EXAM: LEFT FEMUR 2 VIEWS COMPARISON:  None. FINDINGS: Almyra Brace is seen in left femoral neck concerning for nondisplaced fracture. MRI may be performed for further evaluation. Remaining portion of left femur appears normal. IMPRESSION: Linear lucency seen in left femoral neck concerning for nondisplaced fracture.  MRI may be performed for further evaluation. Electronically Signed   By: Marijo Conception, M.D.   On: 02/11/2016 16:54     Medications:  Scheduled: . aspirin EC  81 mg Oral Daily  . feeding supplement (ENSURE ENLIVE)  237 mL Oral BID BM  . levothyroxine  125 mcg Oral QAC breakfast   Continuous:  [id number] sodium, methocarbamol **OR** methocarbamol (ROBAXIN)  IV, morphine injection, oxyCODONE-acetaminophen  Assessment/Plan:  Principal Problem:   Fracture of femoral neck, left (HCC) Active Problems:   Hypothyroidism   Essential hypertension   TIA (transient ischemic attack)   Malnutrition of  moderate degree    Fracture of left femoral neck Patient has been seen by orthopedics. MRI was ordered which did show a fracture. Plan is for surgical intervention today. Patient has a history of mitral valve repair. He has no history of coronary artery disease. He had a negative stress test in 2015 and had an echocardiogram last month, which did not show any wall motion abnormalities. Ejection fraction was normal. No other significant valvular heart disease noted. He was seen by his cardiologist recently as well. Patient does not get any chest discomfort with usual activities. In view of the negative recent cardiac evaluation, patient may proceed to surgery without further testing. His estimated risk probability for perioperative myocardial infarction or cardiac arrest is 1.72% based on the [facility] perioperative cardiac risk index. Pain control.  Hypothyroidism Continue home Synthroid  Essential hypertension  Continue to monitor blood pressures. Not on any antihypertensives. He was on a beta blocker based on recent cardiology notes, however, was taken off due to issues with orthostatic hypotension.  TIA (transient ischemic attack) No acute issues. Continue aspirin  DVT Prophylaxis: Definitive DVT prophylaxis after surgery.    Code Status: DO NOT RESUSCITATE  Family Communication:  Discussed with the patient  Disposition Plan: Await surgical intervention. He is stable at this time.    LOS: 2 days   [facility] Pager [phone] 02/13/2016, 8:19 AM  If 7PM-7AM, please contact night-coverage at [email or web address], password [facility]

## 2016-02-13 NOTE — Op Note (Signed)
[name], GROBER NO.:  [id number]  MEDICAL RECORD NO.:  [id number]  LOCATION:  [facility]                         FACILITY:  [facility]  PHYSICIAN:  Adrian Cassis. Alvan West, M.D.  DATE OF BIRTH:  [birth year]  DATE OF PROCEDURE:  02/13/2016 DATE OF DISCHARGE:                              OPERATIVE REPORT   PREOPERATIVE DIAGNOSIS:  Nondisplaced left femoral neck fracture.  POSTOPERATIVE DIAGNOSIS:  Nondisplaced left femoral neck fracture.  PROCEDURE:  Closed reduction and percutaneous cannulated screw fixation, left femoral neck fracture utilizing Biomet 8.0 mm cannulated screws with a 40 mm thread x3.  SURGEON:  Adrian Cassis. Alvan West, M.D.  ASSISTANT:  Surgical team.  ANESTHESIA:  Spinal.  BLOOD LOSS:  Minimal.  COMPLICATIONS:  None.  INDICATIONS FOR PROCEDURE:  [name] is a pleasant 82 year old male, who unfortunately had a stumble and fall landing on his left hip.  He had immediate onset of pain and was brought to the emergency room. Initially radiographs indicated potential crack involving the left femoral neck.  MRI confirmed acute fracture.  He was admitted to the [facility] per protocol and Orthopedics consulted.  I reviewed with them options and based on the nondisplaced nature of his fracture pattern, he was a candidate for percutaneous cannulated screw fixation versus hemiarthroplasty.  I reviewed the risks of nonunion, malunion, avascular necrosis, need for future surgery, versus the benefits of joint preservation.  Procedure and consent were obtained for benefit of fracture care and pain management.  DESCRIPTION OF PROCEDURE:  The patient was brought to the operative theater.  Once adequate anesthesia, preoperative antibiotics, Ancef 2 g administered.  He was positioned supine on the fracture table.  His unaffected right lower extremity was flexed and abducted out of the way and placed in a leg holder.  All bony prominences were padded particularly over  the peroneal nerve.  The left foot was placed in traction boots with only traction applied to support his leg.  Fluoroscopy was brought into the field to confirm maintenance of the anatomic fracture pattern.  Once this was done, the left hip was then prepped and draped in sterile fashion using shower curtain technique.  A time-out was performed identifying the patient, planned procedure, and extremity.  Fluoroscopy was brought back to the field, landmarks were identified. An incision was then made laterally at the level of the lesser trochanter.  I then inserted guidewires with one inferior central within the femoral neck and then 2 proximal to this, one anterior and one posterior.  They were placed in the inferior posterior in an anterior order.  I measured the depth of each, selected a 100 mm inferior, 95 posterior, and 90 mm.  The outer cortex was now drilled and the screws then placed again in that order inferior, posterior, and anterior.  Each were passed initially by power and then hand tightened with excellent purchase along the lateral cortex of his bone.  The guidewire was removed.  The final radiographs were obtained in AP and lateral planes.  At this point, I reapproximated the iliotibial band using a #1 Vicryl.  I then used 2-0 Vicryl and a running 4-0 Monocryl. The hip was then cleaned,  dried, and dressed sterilely using surgical glue and a Mepilex dressing.  He was then brought to the recovery room in a stable condition tolerating procedure well.  Findings were reviewed with family.  He will be partial weightbearing at least initially until I see how he is tolerating his pain level and we will adjust accordingly in the office.     Adrian West, M.D.     MDO/MEDQ  D:  02/13/2016  T:  02/13/2016  Job:  [id number]

## 2016-02-13 NOTE — Therapy (Signed)
[facility]    , [place],   Phone:     Fax:     Patient Details  Name: Adrian West MRN: [id number] Date of Birth: [birth year] Referring Provider:  No ref. provider found  Encounter Date: 02/11/2016  Patient had not yet returned to the floor from surgery. Therapy will follow up on 02/14/2016 or per MD recommendations.   Carney Living 02/13/2016, 2:59 PM  [facility]    , [place],   Phone:     Fax:

## 2016-02-13 NOTE — Progress Notes (Signed)
Orthopedic Tech Progress Note Patient Details:  Adrian West Jun 30, 1927 [id number] Applied OHF w/ Trapeze to bed. Patient ID: Adrian West, male   DOB: [birth year], 82 y.o.   MRN: [id number]   [name] 02/13/2016, 7:08 PM

## 2016-02-13 NOTE — Transfer of Care (Signed)
Immediate Anesthesia Transfer of Care Note  Patient: Adrian West  Procedure(s) Performed: Procedure(s): CANNULATED LEFT HIP PINNING (Left)  Patient Location: PACU  Anesthesia Type:Spinal  Level of Consciousness: awake, alert  and oriented  Airway & Oxygen Therapy: Patient Spontanous Breathing and Patient connected to nasal cannula oxygen  Post-op Assessment: Report given to RN and Post -op Vital signs reviewed and stable  Post vital signs: Reviewed and stable  Last Vitals:  Vitals:   02/13/16 0550 02/13/16 1340  BP: 123/79 110/89  Pulse: (!) 45 95  Resp:  (!) 33  Temp: 36.9 C 36.6 C    Last Pain:  Vitals:   02/13/16 0950  TempSrc:   PainSc: 4          Complications: No apparent anesthesia complications

## 2016-02-13 NOTE — Progress Notes (Signed)
Patient's history reviewed as has been since admission date Spoke to patient today regarding fracture type and treatment options  He is ready to proceed Risks and benefits of cannulated screw fixation reviewed  NPO Consent signed

## 2016-02-13 NOTE — Interval H&P Note (Signed)
History and Physical Interval Note:  02/13/2016 12:10 PM  Adrian West  has presented today for surgery, with the diagnosis of left femoral neck fracture  The various methods of treatment have been discussed with the patient and family. After consideration of risks, benefits and other options for treatment, the patient has consented to  Procedure(s): CANNULATED HIP PINNING (Left) as a surgical intervention .  The patient's history has been reviewed, patient examined, no change in status, stable for surgery.  I have reviewed the patient's chart and labs.  Questions were answered to the patient's satisfaction.     Mauri Pole

## 2016-02-13 NOTE — Progress Notes (Signed)
     Subjective: Day of Surgery Procedure(s) (LRB): CANNULATED HIP PINNING (Left)   Patient reports pain as mild, with no motion of the leg.  He did describe his fall onto the left hip and subsequent course to the hospital. We have discussed the surgery and he understands.  Risks, benefits and expectations were discussed with the patient.  Risks including but not limited to the risk of anesthesia, blood clots, nerve damage, blood vessel damage, failure of the prosthesis, infection and up to and including death.  Patient understand the risks, benefits and expectations and wishes to proceed with surgery.   Objective:   VITALS:   Vitals:   02/12/16 2058 02/13/16 0550  BP: 140/71 123/79  Pulse: 73 (!) 45  Resp:    Temp: 98.2 F (36.8 C) 98.5 F (36.9 C)    Neurovascular intact Dorsiflexion/Plantar flexion intact No cellulitis present Compartment soft  LABS  Recent Labs  02/11/16 1605  HGB 14.4  HCT 43.8  WBC 7.6  PLT 201     Recent Labs  02/11/16 1605  NA 135  K 4.2  BUN 15  CREATININE 0.90  GLUCOSE 100*     Assessment/Plan: Day of Surgery Procedure(s) (LRB): CANNULATED HIP PINNING (Left)  Surgery planned for today per Dr. Alvan Dame. NPO now Consent to be obtained.     West Pugh Mahek Schlesinger   PAC  02/13/2016, 9:07 AM

## 2016-02-13 NOTE — Progress Notes (Signed)
Patient reported shortness of breath on arrival to short stay that he describes as a little worse than normal. Patient requested oxygen and reports that he uses this at home on a PRN basis. Patient placed on oxygen with relief of symptoms. Denies chest pain. Initial oxygen saturations 94% on RA. Patient speaking in complete sentences. Patient with wet, raspy cough that he states is normal.  Initial heart rate reading 36. Patient placed on monitor and is in and out of ventricular bigeminy. Dr. Ola Spurr notified.

## 2016-02-13 NOTE — H&P (View-Only) (Signed)
     Subjective: Day of Surgery Procedure(s) (LRB): CANNULATED HIP PINNING (Left)   Patient reports pain as mild, with no motion of the leg.  He did describe his fall onto the left hip and subsequent course to the hospital. We have discussed the surgery and he understands.  Risks, benefits and expectations were discussed with the patient.  Risks including but not limited to the risk of anesthesia, blood clots, nerve damage, blood vessel damage, failure of the prosthesis, infection and up to and including death.  Patient understand the risks, benefits and expectations and wishes to proceed with surgery.   Objective:   VITALS:   Vitals:   02/12/16 2058 02/13/16 0550  BP: 140/71 123/79  Pulse: 73 (!) 45  Resp:    Temp: 98.2 F (36.8 C) 98.5 F (36.9 C)    Neurovascular intact Dorsiflexion/Plantar flexion intact No cellulitis present Compartment soft  LABS  Recent Labs  02/11/16 1605  HGB 14.4  HCT 43.8  WBC 7.6  PLT 201     Recent Labs  02/11/16 1605  NA 135  K 4.2  BUN 15  CREATININE 0.90  GLUCOSE 100*     Assessment/Plan: Day of Surgery Procedure(s) (LRB): CANNULATED HIP PINNING (Left)  Surgery planned for today per Dr. Alvan Dame. NPO now Consent to be obtained.     Adrian Adrian   PAC  02/13/2016, 9:07 AM

## 2016-02-13 NOTE — Anesthesia Preprocedure Evaluation (Addendum)
Anesthesia Evaluation  Patient identified by MRN, date of birth, ID band Patient awake    Reviewed: Allergy & Precautions, H&P , NPO status , Patient's Chart, lab work & pertinent test results  Airway Mallampati: II  TM Distance: >3 FB Neck ROM: Full    Dental no notable dental hx. (+) Teeth Intact, Dental Advisory Given   Pulmonary shortness of breath and Long-Term Oxygen Therapy,    Pulmonary exam normal breath sounds clear to auscultation       Cardiovascular hypertension, negative cardio ROS   Rhythm:Regular Rate:Normal  S/p MVR   Neuro/Psych TIAnegative psych ROS   GI/Hepatic negative GI ROS, Neg liver ROS,   Endo/Other  Hypothyroidism   Renal/GU negative Renal ROS  negative genitourinary   Musculoskeletal   Abdominal   Peds  Hematology negative hematology ROS (+)   Anesthesia Other Findings   Reproductive/Obstetrics negative OB ROS                           Anesthesia Physical Anesthesia Plan  ASA: III  Anesthesia Plan: MAC and Spinal   Post-op Pain Management:    Induction: Intravenous  Airway Management Planned: Simple Face Mask  Additional Equipment:   Intra-op Plan:   Post-operative Plan:   Informed Consent: I have reviewed the patients History and Physical, chart, labs and discussed the procedure including the risks, benefits and alternatives for the proposed anesthesia with the patient or authorized representative who has indicated his/her understanding and acceptance.   Dental advisory given  Plan Discussed with: CRNA  Anesthesia Plan Comments:         Anesthesia Quick Evaluation

## 2016-02-13 NOTE — Brief Op Note (Signed)
02/11/2016 - 02/13/2016  12:17 PM  PATIENT:  Adrian Kitchen T West  82 y.o. male  PRE-OPERATIVE DIAGNOSIS:  Non-displaced left femoral neck fracture  POST-OPERATIVE DIAGNOSIS:  Non-displaced left femoral neck fracture  PROCEDURE:  Procedure(s): CANNULATED HIP PINNING (Left)  SURGEON:  Surgeon(s) and Role:    * [name], MD - Primary  PHYSICIAN ASSISTANT: None   ANESTHESIA:   spinal  EBL:  <50cc  BLOOD ADMINISTERED:none  DRAINS: none   LOCAL MEDICATIONS USED:  NONE  SPECIMEN:  No Specimen  DISPOSITION OF SPECIMEN:  N/A  COUNTS:  YES  TOURNIQUET:  * No tourniquets in log *  DICTATION: .Other Dictation: Dictation Number [id number]  PLAN OF CARE: Admit to inpatient   PATIENT DISPOSITION:  PACU - hemodynamically stable.   Delay start of Pharmacological VTE agent (>24hrs) due to surgical blood loss or risk of bleeding: no

## 2016-02-13 NOTE — Care Management Note (Signed)
Case Management Note  Patient Details  Name: Adrian West MRN: [id number] Date of Birth: 1927/06/15  Subjective/Objective: 82 yo M fell and sustained a L non-displaced femoral neck fx. He resides at [facility]. He is awaiting for surgery.                Action/Plan: CM referral to assist with D/C needs   Expected Discharge Date:                  Expected Discharge Plan:  [place]  In-House Referral:  Clinical Social Work  Discharge planning Services  CM Consult  Post Acute Care Choice:    Choice offered to:     DME Arranged:    DME Agency:     HH Arranged:    [place] Agency:     Status of Service:  In process, will continue to follow  If discussed at Long Length of Stay Meetings, dates discussed:    Additional Comments: pt is awaiting for surgery. Will continue to f/u after surgery and PT evaluation to assist with the d/c plan.  Norina Buzzard, RN 02/13/2016, 8:17 AM

## 2016-02-13 NOTE — Anesthesia Procedure Notes (Signed)
Spinal  Patient location during procedure: OR Start time: 02/13/2016 1:33 PM End time: 02/13/2016 12:39 PM Staffing Anesthesiologist: Roderic Palau Performed: anesthesiologist  Preanesthetic Checklist Completed: patient identified, surgical consent, pre-op evaluation, timeout performed, IV checked, risks and benefits discussed and monitors and equipment checked Spinal Block Patient position: right lateral decubitus Prep: ChloraPrep Patient monitoring: cardiac monitor, continuous pulse ox and blood pressure Approach: midline Location: L3-4 Injection technique: single-shot Needle Needle type: Quincke  Needle gauge: 22 G Needle length: 9 cm Assessment Sensory level: T8 Additional Notes Functioning IV was confirmed and monitors were applied. Sterile prep and drape, including hand hygiene and sterile gloves were used. The patient was positioned and the spine was prepped. The skin was anesthetized with lidocaine.  Free flow of clear CSF was obtained prior to injecting local anesthetic into the CSF.  The spinal needle aspirated freely following injection.  The needle was carefully withdrawn.  The patient tolerated the procedure well.

## 2016-02-14 DIAGNOSIS — R001 Bradycardia, unspecified: Secondary | ICD-10-CM

## 2016-02-14 LAB — CBC
HCT: 43.7 % (ref 39.0–52.0)
Hemoglobin: 14.3 g/dL (ref 13.0–17.0)
MCH: 31.9 pg (ref 26.0–34.0)
MCHC: 32.7 g/dL (ref 30.0–36.0)
MCV: 97.5 fL (ref 78.0–100.0)
Platelets: 176 10*3/uL (ref 150–400)
RBC: 4.48 MIL/uL (ref 4.22–5.81)
RDW: 14.8 % (ref 11.5–15.5)
WBC: 5.2 10*3/uL (ref 4.0–10.5)

## 2016-02-14 LAB — BASIC METABOLIC PANEL
Anion gap: 6 (ref 5–15)
BUN: 12 mg/dL (ref 6–20)
CALCIUM: 8.6 mg/dL — AB (ref 8.9–10.3)
CO2: 28 mmol/L (ref 22–32)
CREATININE: 0.76 mg/dL (ref 0.61–1.24)
Chloride: 101 mmol/L (ref 101–111)
GFR calc non Af Amer: >60 mL/min (ref >60)
Glucose, Bld: 123 mg/dL — ABNORMAL HIGH (ref 65–99)
Potassium: 4.1 mmol/L (ref 3.5–5.1)
SODIUM: 135 mmol/L (ref 135–145)

## 2016-02-14 LAB — TSH: TSH: 6.504 u[IU]/mL — AB (ref 0.350–4.500)

## 2016-02-14 LAB — T4, FREE: FREE T4: 0.93 ng/dL (ref 0.61–1.12)

## 2016-02-14 NOTE — Progress Notes (Signed)
Pt stood at bedside twice during the night. 50% weight bearing. Tolerated well.

## 2016-02-14 NOTE — Progress Notes (Signed)
   Subjective: 1 Day Post-Op Procedure(s) (LRB): CANNULATED LEFT HIP PINNING (Left)  Pt doing very well Minimal pain in the left hip Good attitude with his recovery Patient reports pain as mild.  Objective:   VITALS:   Vitals:   02/14/16 0400 02/14/16 0548  BP:  (!) 98/43  Pulse:  (!) 42  Resp: 18   Temp:  97.6 F (36.4 C)    Left hip incision healing well nv intact distally No rashes or edema  LABS  Recent Labs  02/11/16 1605 02/13/16 1842 02/14/16 0443  HGB 14.4 14.2 14.3  HCT 43.8 43.4 43.7  WBC 7.6 6.4 5.2  PLT 201 183 176     Recent Labs  02/11/16 1605 02/13/16 1842 02/14/16 0443  NA 135  --  135  K 4.2  --  4.1  BUN 15  --  12  CREATININE 0.90 0.66 0.76  GLUCOSE 100*  --  123*     Assessment/Plan: 1 Day Post-Op Procedure(s) (LRB): CANNULATED LEFT HIP PINNING (Left) Pt stable from orthopedic standpoint for d/c once medially stable PT/OT Pain management as needed F/u in 2 weeks once d/c with Dr. Jolyn Nap, MPAS, PA-C  02/14/2016, 7:52 AM

## 2016-02-14 NOTE — Evaluation (Signed)
Physical Therapy Evaluation Patient Details Name: Adrian West MRN: [id number] DOB: Aug 31, 1927 Today's Date: 02/14/2016   History of Present Illness  81 year old Caucasian male with a past medical history of hypertension, GERD, hypothyroidism, TIA, MGUS, history of salivary gland malignancy, right-sided Bell's palsy, interstitial lung disease who presented after a fall, complaining of left hip pain. Now s/p CANNULATED LEFT HIP PINNING (Left) on 9/9.   Clinical Impression  Pt was able to walk a short distance in his room today using RW and maintaining PWB 50% on his left leg.  He would benefit from SNF level rehab at discharge.  PT to follow acutely for deficits listed below.       Follow Up Recommendations SNF    Equipment Recommendations  Rolling walker with 5" wheels    Recommendations for Other Services    NA   Precautions / Restrictions Precautions Precautions: Fall Restrictions Weight Bearing Restrictions: Yes LLE Weight Bearing: Partial weight bearing LLE Partial Weight Bearing Percentage or Pounds: 50%      Mobility  Bed Mobility Overal bed mobility: Needs Assistance Bed Mobility: Supine to Sit     Supine to sit: Supervision;HOB elevated Sit to supine: Min assist   General bed mobility comments: Supervision for safety and verbal cues for technique, HOB elevated and pt using bed rail for leverage.  Transfers Overall transfer level: Needs assistance Equipment used: Rolling walker (2 wheeled) Transfers: Sit to/from Stand Sit to Stand: Min assist;From elevated surface         General transfer comment: Min assist to support trunk during transitions and to steady pt during transition of hands.  Pt needed verbal cues for safe hand placement.   Ambulation/Gait Ambulation/Gait assistance: Mod assist;Min assist Ambulation Distance (Feet): 20 Feet Assistive device: Rolling walker (2 wheeled) Gait Pattern/deviations: Step-to pattern;Antalgic     General Gait  Details: Pt with moderately antalgic gait patter for the first 5-8' or so with significant instability on his left leg when WB, however, the further we went the more stable he got and he required less assistance by the time we got around the end of the bed to the recliner chair. This therapist was pulling a chair behind just in case for safety, however, he didn't need it.           Balance Overall balance assessment: Needs assistance Sitting-balance support: Feet supported;No upper extremity supported Sitting balance-Leahy Scale: Good     Standing balance support: Bilateral upper extremity supported Standing balance-Leahy Scale: Poor Standing balance comment: RW and external assist for support in standing.                              Pertinent Vitals/Pain Pain Assessment: 0-10 Pain Score: 4  Pain Location: left hip Pain Descriptors / Indicators: Sore Pain Intervention(s): Limited activity within patient's tolerance;Monitored during session;Repositioned    Home Living Family/patient expects to be discharged to:: Skilled nursing facility Living Arrangements: Spouse/significant other               Additional Comments: Pt lives at [facility], plan is to d/c to [facility] for rehab before returning home.    Prior Function Level of Independence: Independent with assistive device(s)         Comments: cane for all mobility. (although he admitts his wife and MD told him he should useRW)        Extremity/Trunk Assessment   Upper Extremity Assessment: Defer to  OT evaluation           Lower Extremity Assessment: LLE deficits/detail   LLE Deficits / Details: left leg with normal post op weakness and pain.  At least 3/5 ankle, 3-/5 knee extension, and 2/5 hip strength   Cervical / Trunk Assessment: Normal  Communication   Communication: HOH  Cognition Arousal/Alertness: Awake/alert Behavior During Therapy: WFL for tasks  assessed/performed Overall Cognitive Status: Within Functional Limits for tasks assessed                         Exercises Total Joint Exercises Ankle Circles/Pumps: AROM;Both;20 reps Quad Sets: AROM;Left;10 reps Short Arc Quad: AROM;Left;10 reps Heel Slides: AAROM;Left;10 reps Hip ABduction/ADduction: AAROM;Left;10 reps Long Arc Quad: AROM;Left;10 reps      Assessment/Plan    PT Assessment Patient needs continued PT services  PT Diagnosis Difficulty walking;Abnormality of gait;Generalized weakness;Acute pain   PT Problem List Decreased strength;Decreased range of motion;Decreased activity tolerance;Decreased balance;Decreased mobility;Decreased knowledge of use of DME;Pain  PT Treatment Interventions DME instruction;Gait training;Functional mobility training;Stair training;Therapeutic activities;Balance training;Therapeutic exercise;Neuromuscular re-education;Patient/family education;Modalities   PT Goals (Current goals can be found in the Care Plan section) Acute Rehab PT Goals Patient Stated Goal: get better and return home PT Goal Formulation: With patient Time For Goal Achievement: 02/28/16 Potential to Achieve Goals: Good    Frequency Min 3X/week           End of Session Equipment Utilized During Treatment: Gait belt Activity Tolerance: Patient limited by pain Patient left: in chair;with call bell/phone within reach Nurse Communication: Mobility status         Time: 1345-1431 PT Time Calculation (min) (ACUTE ONLY): 46 min   Charges:   PT Evaluation $PT Eval Moderate Complexity: 1 Procedure PT Treatments $Gait Training: 8-22 mins $Therapeutic Exercise: 8-22 mins        Mechell Girgis B. [place], [place], DPT [phone]   02/14/2016, 4:09 PM

## 2016-02-14 NOTE — Progress Notes (Signed)
[facility] PROGRESS NOTE  Adrian West [id number] DOB: February 06, 1928 DOA: 02/11/2016  PCP: [name], MD  Brief HPI: 82 year old Caucasian male with a past medical history of hypertension, GERD, hypothyroidism, TIA, MGUS, history of salivary gland malignancy, right-sided Bell's palsy, interstitial lung disease who presented after a fall, complaining of left hip pain. Patient was found to have a hip fracture and was hospitalized for further management.  Past medical history:  Past Medical History:  Diagnosis Date  . Anorexia 08/16/2014  . Bell's palsy   . Benign neoplasm of colon   . 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 Deaf   . Diaphragm paralysis   . Diverticulosis   . DNR (do not resuscitate) discussion 07/22/2014  . Dyspnea and respiratory abnormality 03/10/2014  . 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 Interstitial lung disease 10/15/13 Xray [facility]) 01/02/12 new subsegmental ATX on L (vs 09/03/11) Seeing Dr Theda Sers [facility]  Seen by Dr Corrin Parker, [facility],  02/2011. Component of thoracic height loss & alcohol related neuropathy questioned. No plication recommended  5/4- 10/14/2013 @ [facility] ; Dr  Ellis Parents.10/15/13 new focal consolidation mid anterior L lung:aspiration vs PNA . Underlying interstitial lung disease.10/15/13 Rx sent to [facility] for Doxycycline & Augmentin by Dr Jeannine Kitten ( not picked up as of 10/18/13).  Flew back to [facility]; seen @ 10/16/13 [facility], [place]. Levaquin X 5 days , Dr Evalee Mutton    . Esophageal stricture   . Gait disturbance   . GERD 02/14/2008   Qualifier: Diagnosis of  By: [name] MD, [name]    . Hiatal hernia   . History of radiation therapy 06/11/14-07/22/14   skin cancer of scalp/face  . HYPERTENSION 02/14/2008       .  HYPOTHYROIDISM 02/14/2008  . Interstitial lung disease ([place]) 05/05/2014  . Loss of weight 07/22/2014  . Lung abnormality 11/25/2008   "nerve damage from heart surgery"  . Memory loss 12/31/2009   Qualifier: Diagnosis of  By: [name] MD, [name]    . MGUS (monoclonal gammopathy of unknown significance) 04/11/2013  . MITRAL REGURGITATION 02/14/2008   Qualifier: Diagnosis of  By: [name] MD, Cherlynn Kaiser MV surgery 11/2008, [name].complcated by paralysis of diaphragm     . 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 Neuropathy, peripheral ([place])   . Other and unspecified hyperlipidemia 02/24/2012  . Polycythemia, secondary 01/10/2014  . PVC's (premature ventricular contractions) 04/05/2011  . Radiation 06/11/14- 07/22/14   Right parotid bed, posterior auricular, right upper neck.  . 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 Skin cancer of scalp or skin of neck 05/21/2014  . SPINAL STENOSIS, LUMBAR 07/28/2010   Qualifier: Diagnosis of  By: [name] MD, [name], NS ( retired)    . Squamous carcinoma ([place])   . TIA (transient ischemic attack) May 2012  . Tremor 2015  . Tremor, essential   . Vertigo 2005  . Vertigo 2005  . Weakness generalized 07/22/2014    Consultants: Orthopedics  Procedures: 9/9: Closed reduction and percutaneous cannulated screw fixation, left femoral neck fracture utilizing Biomet 8.0 mm cannulated screws  with a 40 mm thread x3.  Antibiotics: None  Subjective: Patient reports no problems after surgery yesterday. He denies any chest pain, shortness of breath. Pain is very well controlled here. Currently 5 out of 10 in intensity. Denies any nausea or vomiting. He feels quite well.    Objective:  Vital Signs  Vitals:   02/13/16 2223 02/14/16 0229 02/14/16 0400 02/14/16 0548  BP:  112/60 (!) 107/44  (!) 98/43  Pulse: 74 (!) 56  (!) 42  Resp:   18   Temp: 97.6 F (36.4 C)   97.6 F (36.4 C)  TempSrc: Oral   Oral  SpO2: 98% 95%  99%  Weight:      Height:        Intake/Output Summary (Last 24 hours) at 02/14/16 0859 Last data filed at 02/14/16 0008  Gross per 24 hour  Intake             1120 ml  Output              670 ml  Net              450 ml   Filed Weights   02/11/16 1544  Weight: 68 kg (150 lb)    General appearance: alert, cooperative. Facial deformity noted because of previous surgeries. Resp: clear to auscultation bilaterally Cardio: S1, S2 is noted to be bradycardic, regular. No S3, S4. No rubs, murmurs, or bruit. No pedal edema. GI: soft, non-tender; bowel sounds normal; no masses,  no organomegaly Extremities: No bruising noted at the surgical site left leg Neurologic: Alert and oriented X 3, no focal neurological deficit  Lab Results:  Data Reviewed: I have personally reviewed following labs and imaging studies  CBC:  Recent Labs Lab 02/11/16 1605 02/13/16 1842 02/14/16 0443  WBC 7.6 6.4 5.2  NEUTROABS 6.5  --   --   HGB 14.4 14.2 14.3  HCT 43.8 43.4 43.7  MCV 96.7 98.2 97.5  PLT 201 183 0000000   Basic Metabolic Panel:  Recent Labs Lab 02/11/16 1605 02/13/16 1842 02/14/16 0443  NA 135  --  135  K 4.2  --  4.1  CL 100*  --  101  CO2 28  --  28  GLUCOSE 100*  --  123*  BUN 15  --  12  CREATININE 0.90 0.66 0.76  CALCIUM 8.9  --  8.6*   GFR: Estimated Creatinine Clearance: 64.9 mL/min (by C-G formula based on SCr of 0.8 mg/dL).  Coagulation Profile:  Recent Labs Lab 02/11/16 2110  INR 0.99     Recent Results (from the past 240 hour(s))  Surgical PCR screen     Status: None   Collection Time: 02/12/16  1:49 AM  Result Value Ref Range Status   MRSA, PCR NEGATIVE NEGATIVE Final   Staphylococcus aureus NEGATIVE NEGATIVE Final    Comment:        The Xpert SA Assay (FDA approved for NASAL specimens in  patients over 63 years of age), is one component of a comprehensive surveillance program.  Test performance has been validated by [facility] for patients greater than or equal to 22 year old. It is not intended to diagnose infection nor to guide or monitor treatment.       Radiology Studies: Dg C-arm 1-60 Min  Result Date: 02/13/2016 CLINICAL DATA:  Left hip pinning. EXAM: OPERATIVE left HIP (WITH PELVIS IF PERFORMED) 2 VIEWS TECHNIQUE: Fluoroscopic spot image(s) were submitted for interpretation post-operatively. FLUOROSCOPY  TIME:  1 minutes 6 seconds. COMPARISON:  Radiographs of April 13, 2015. FINDINGS: These images demonstrate internal pinning of proximal left femoral neck fracture. Good alignment of fracture components is noted. IMPRESSION: Status post surgical internal fixation of proximal left femoral neck fracture. Electronically Signed   By: Marijo Conception, M.D.   On: 02/13/2016 13:51   Dg Hip Operative Unilat W Or W/o Pelvis Left  Result Date: 02/13/2016 CLINICAL DATA:  Left hip pinning. EXAM: OPERATIVE left HIP (WITH PELVIS IF PERFORMED) 2 VIEWS TECHNIQUE: Fluoroscopic spot image(s) were submitted for interpretation post-operatively. FLUOROSCOPY TIME:  1 minutes 6 seconds. COMPARISON:  Radiographs of April 13, 2015. FINDINGS: These images demonstrate internal pinning of proximal left femoral neck fracture. Good alignment of fracture components is noted. IMPRESSION: Status post surgical internal fixation of proximal left femoral neck fracture. Electronically Signed   By: Marijo Conception, M.D.   On: 02/13/2016 13:51     Medications:  Scheduled: . aspirin EC  81 mg Oral Daily  . docusate sodium  100 mg Oral BID  . enoxaparin (LOVENOX) injection  40 mg Subcutaneous Q24H  . feeding supplement (ENSURE ENLIVE)  237 mL Oral BID BM  . ketorolac  1 drop Right Eye QID  . levothyroxine  125 mcg Oral QAC breakfast   Continuous: . sodium chloride 10 mL/hr at 02/13/16 2100  .  lactated ringers     [id number] **OR** acetaminophen, alum & mag hydroxide-simeth, docusate sodium, HYDROcodone-acetaminophen, menthol-cetylpyridinium **OR** phenol, methocarbamol **OR** methocarbamol (ROBAXIN)  IV, metoCLOPramide **OR** metoCLOPramide (REGLAN) injection, morphine injection, morphine injection, ondansetron **OR** ondansetron (ZOFRAN) IV, oxyCODONE-acetaminophen, polyethylene glycol  Assessment/Plan:  Principal Problem:   Fracture of femoral neck, left (HCC) Active Problems:   Hypothyroidism   Essential hypertension   TIA (transient ischemic attack)   Malnutrition of moderate degree    Fracture of left femoral neck Patient has been seen by orthopedics. MRI was ordered which did show a fracture. Patient underwent closed reduction and percutaneous screw fixation. He seems to be stable postoperatively. Patient has a history of mitral valve repair. He has no history of coronary artery disease. He had a negative stress test in 2015 and had an echocardiogram last month, which did not show any wall motion abnormalities. Ejection fraction was normal. No other significant valvular heart disease noted. He was seen by his cardiologist recently as well. Pain control.  Hypothyroidism Continue home Synthroid. TSH is 6.5. However, free T4 is 0.93  Bradycardia Patient has asymptomatic bradycardia. Check an EKG to document. His blood pressure is normal. Free T4 is normal. This could be his usual baseline based on what he tells me. He is not on any rate limiting drugs. This should not limit any activities.  Essential hypertension  Continue to monitor blood pressures. Not on any antihypertensives. He was on a beta blocker based on recent cardiology notes, however, was taken off due to issues with orthostatic hypotension.  TIA (transient ischemic attack) No acute issues. Continue aspirin  DVT Prophylaxis: Lovenox    Code Status: DO NOT RESUSCITATE  Family Communication:  Discussed with the patient  Disposition Plan: PT evaluation. Anticipate discharge to SNF tomorrow.    LOS: 3 days   [facility] Pager [phone] 02/14/2016, 8:59 AM  If 7PM-7AM, please contact night-coverage at [email or web address], password [facility]

## 2016-02-14 NOTE — Evaluation (Signed)
Occupational Therapy Evaluation Patient Details Name: Adrian West MRN: [id number] DOB: 1928-01-24 Today's Date: 02/14/2016    History of Present Illness 82 year old Caucasian male with a past medical history of hypertension, GERD, hypothyroidism, TIA, MGUS, history of salivary gland malignancy, right-sided Bell's palsy, interstitial lung disease who presented after a fall, complaining of left hip pain. Now s/p CANNULATED LEFT HIP PINNING (Left) on 9/9.    Clinical Impression   Pt reports he was independent with ADL PTA. Currently pt overall min assist for ADL and functional mobility with the exception of mod assist for LB ADL. Educated pt on PWB (50%) status of LLE; pt verbalized understanding and able to maintain throughout functional mobility. Recommending SNF for further rehab prior to return home to maximize independence and safety with ADL and functional mobility. Pt would benefit from continued skilled OT to address established goals.    Follow Up Recommendations  SNF;Supervision/Assistance - 24 hour    Equipment Recommendations  Other (comment) (TBD at next venue)    Recommendations for Other Services       Precautions / Restrictions Precautions Precautions: Fall Restrictions Weight Bearing Restrictions: Yes LLE Weight Bearing: Partial weight bearing LLE Partial Weight Bearing Percentage or Pounds: 50%      Mobility Bed Mobility Overal bed mobility: Needs Assistance Bed Mobility: Sit to Supine       Sit to supine: Min assist   General bed mobility comments: Min assist for LLE back to bed and positioning LLE in bed.  Transfers Overall transfer level: Needs assistance Equipment used: Rolling walker (2 wheeled) Transfers: Sit to/from Stand Sit to Stand: Min assist         General transfer comment: Min assist to boost up from chair and for balance during transition of hands from chair to RW.    Balance Overall balance assessment: Needs  assistance Sitting-balance support: Feet supported;No upper extremity supported Sitting balance-Leahy Scale: Good     Standing balance support: Bilateral upper extremity supported Standing balance-Leahy Scale: Poor Standing balance comment: RW for support                            ADL Overall ADL's : Needs assistance/impaired Eating/Feeding: Set up;Sitting   Grooming: Set up;Supervision/safety;Sitting   Upper Body Bathing: Set up;Supervision/ safety;Sitting   Lower Body Bathing: Moderate assistance;Sit to/from stand   Upper Body Dressing : Set up;Supervision/safety;Sitting   Lower Body Dressing: Moderate assistance;Sit to/from stand   Toilet Transfer: Minimal assistance;Ambulation;BSC;RW Toilet Transfer Details (indicate cue type and reason): Simulated by sit to stand from chair with functional mobility in room. Toileting- Clothing Manipulation and Hygiene: Minimal assistance;Sit to/from stand       Functional mobility during ADLs: Minimal assistance;Rolling walker General ADL Comments: SpO2 down to 84% on 2L O2 following activity; quickly returned to 94% on 2L. Educated pt on PWB status of LLE (50% of body weight); pt verbalized understanding and able to maintain throughout functional mobility.     Vision Vision Assessment?: No apparent visual deficits   Perception     Praxis      Pertinent Vitals/Pain Pain Assessment: 0-10 Pain Score: 4  Pain Location: L hip Pain Descriptors / Indicators: Sore Pain Intervention(s): Monitored during session;Repositioned     Hand Dominance     Extremity/Trunk Assessment Upper Extremity Assessment Upper Extremity Assessment: Overall WFL for tasks assessed   Lower Extremity Assessment Lower Extremity Assessment: Defer to PT evaluation   Cervical / Trunk Assessment  Cervical / Trunk Assessment: Normal   Communication Communication Communication: No difficulties;HOH   Cognition Arousal/Alertness:  Awake/alert Behavior During Therapy: WFL for tasks assessed/performed Overall Cognitive Status: Within Functional Limits for tasks assessed                     General Comments       Exercises       Shoulder Instructions      Home Living Family/patient expects to be discharged to:: Skilled nursing facility                                 Additional Comments: Pt lives at [facility], plan is to d/c to [facility] SNF for rehab before returning home.      Prior Functioning/Environment Level of Independence: Independent with assistive device(s)        Comments: cane for all mobility.    OT Diagnosis: Generalized weakness;Acute pain   OT Problem List: Decreased strength;Decreased activity tolerance;Impaired balance (sitting and/or standing);Decreased knowledge of use of DME or AE;Decreased knowledge of precautions;Pain   OT Treatment/Interventions: Self-care/ADL training;Energy conservation;DME and/or AE instruction;Therapeutic activities;Patient/family education;Balance training    OT Goals(Current goals can be found in the care plan section) Acute Rehab OT Goals Patient Stated Goal: get better and return home OT Goal Formulation: With patient Time For Goal Achievement: 02/28/16 Potential to Achieve Goals: Good ADL Goals Pt Will Perform Grooming: with supervision;standing Pt Will Perform Lower Body Bathing: with supervision;sit to/from stand (with or without AE) Pt Will Perform Lower Body Dressing: with supervision;sit to/from stand (with or without AE) Pt Will Transfer to Toilet: with supervision;ambulating;bedside commode Pt Will Perform Toileting - Clothing Manipulation and hygiene: with supervision;sit to/from stand  OT Frequency: Min 2X/week   Barriers to D/C:            Co-evaluation              End of Session Equipment Utilized During Treatment: Gait belt;Rolling walker;Oxygen  Activity Tolerance: Patient  tolerated treatment well Patient left: in bed;with call bell/phone within reach;with bed alarm set;with SCD's reapplied   Time: [id number] OT Time Calculation (min): 19 min Charges:  OT General Charges $OT Visit: 1 Procedure OT Evaluation $OT Eval Moderate Complexity: 1 Procedure G-Codes:     Binnie Kand M.S., OTR/L Pager: [phone]  02/14/2016, 3:34 PM

## 2016-02-15 ENCOUNTER — Other Ambulatory Visit (HOSPITAL_COMMUNITY): Payer: Self-pay

## 2016-02-15 LAB — CBC
HCT: 44.2 % (ref 39.0–52.0)
Hemoglobin: 14.4 g/dL (ref 13.0–17.0)
MCH: 31.9 pg (ref 26.0–34.0)
MCHC: 32.6 g/dL (ref 30.0–36.0)
MCV: 98 fL (ref 78.0–100.0)
PLATELETS: 177 10*3/uL (ref 150–400)
RBC: 4.51 MIL/uL (ref 4.22–5.81)
RDW: 14.7 % (ref 11.5–15.5)
WBC: 4.4 10*3/uL (ref 4.0–10.5)

## 2016-02-15 LAB — BASIC METABOLIC PANEL
ANION GAP: 6 (ref 5–15)
BUN: 15 mg/dL (ref 6–20)
CHLORIDE: 101 mmol/L (ref 101–111)
CO2: 29 mmol/L (ref 22–32)
Calcium: 8.7 mg/dL — ABNORMAL LOW (ref 8.9–10.3)
Creatinine, Ser: 0.8 mg/dL (ref 0.61–1.24)
GFR calc non Af Amer: >60 mL/min (ref >60)
Glucose, Bld: 95 mg/dL (ref 65–99)
POTASSIUM: 4.5 mmol/L (ref 3.5–5.1)
SODIUM: 136 mmol/L (ref 135–145)

## 2016-02-15 MED ORDER — HYDROCODONE-ACETAMINOPHEN 5-325 MG PO TABS
1.0000 | ORAL_TABLET | Freq: Four times a day (QID) | ORAL | 0 refills | Status: DC | PRN
Start: 2016-02-15 — End: 2017-02-22

## 2016-02-15 MED ORDER — ENSURE ENLIVE PO LIQD: 237.0000 mL | Freq: Two times a day (BID) | ORAL | 12 refills | Status: DC

## 2016-02-15 MED ORDER — ACETAMINOPHEN 325 MG PO TABS: 650.0000 mg | ORAL_TABLET | Freq: Four times a day (QID) | ORAL | Status: DC | PRN

## 2016-02-15 MED ORDER — ENOXAPARIN SODIUM 40 MG/0.4ML ~~LOC~~ SOLN: 40.0000 mg | SUBCUTANEOUS | 0 refills | Status: DC

## 2016-02-15 MED ORDER — POLYETHYLENE GLYCOL 3350 17 G PO PACK: 17.0000 g | PACK | Freq: Every day | ORAL | 0 refills | Status: DC | PRN

## 2016-02-15 MED ORDER — DOCUSATE SODIUM 100 MG PO CAPS
100.0000 mg | ORAL_CAPSULE | Freq: Two times a day (BID) | ORAL | 0 refills | Status: DC
Start: 2016-02-15 — End: 2016-04-01

## 2016-02-15 MED ORDER — FERROUS SULFATE 325 (65 FE) MG PO TABS: 325.0000 mg | ORAL_TABLET | Freq: Two times a day (BID) | ORAL | 3 refills | Status: DC

## 2016-02-15 NOTE — Progress Notes (Signed)
     Subjective: 2 Days Post-Op Procedure(s) (LRB): CANNULATED LEFT HIP PINNING (Left)   Patient reports pain as mild, pain well controlled. Very happy with the hip surgery to this point.  Very happy that he was able to get Dr. Alvan Dame to do the surgery. No events throughout the night.    Objective:   VITALS:   Vitals:   02/15/16  BP: (!) 98/50  Pulse: 65  Resp: 16  Temp: 97.9 F (36.6 C)     Dorsiflexion/Plantar flexion intact Incision: dressing C/D/I No cellulitis present Compartment soft  LABS  Recent Labs  02/13/16 1842 02/14/16 0443 02/15/16 0339  HGB 14.2 14.3 14.4  HCT 43.4 43.7 44.2  WBC 6.4 5.2 4.4  PLT 183 176 177     Recent Labs  02/13/16 1842 02/14/16 0443 02/15/16 0339  NA  --  135 136  K  --  4.1 4.5  BUN  --  12 15  CREATININE 0.66 0.76 0.80  GLUCOSE  --  123* 95     Assessment/Plan: 2 Days Post-Op Procedure(s) (LRB): CANNULATED LEFT HIP PINNING (Left) Up with therapy Discharge to be determined Orthopaedically stable  Ortho recommendations:  Lovenox for 2 weeks for anticoagulation, unless other medically indicated.  Norco for pain management (Rx written).  MiraLax and Colace for constipation  Iron 325 mg tid for 2-3 weeks   PWB on the left leg.  Dressing to remain in place for 2-3 days, then replace daily and as needed with sterile gauze and tape.  Follow up in 2 weeks at [facility]. Follow up with OLIN,[place] Kinyon D in 2 weeks.  Contact information:  [facility] [address], [address] [place]ahalan   PAC  02/15/2016, 6:59 AM

## 2016-02-15 NOTE — NC FL2 (Signed)
[place] LEVEL OF CARE SCREENING TOOL     IDENTIFICATION  Patient Name: Adrian West Birthdate: 11-13-1927 Sex: male Admission Date (Current Location): 02/11/2016  [facility] and [place] Number:      Facility and Address:  The . [facility], [place] [address], [place], [place] [address]      Provider Number: [id number]  Attending Physician Name and Address:  Bonnielee Haff, MD  Relative Name and Phone Number:       Current Level of Care: Hospital Recommended Level of Care: [place] Prior Approval Number:    Date Approved/Denied:   PASRR Number:    Discharge Plan:      Current Diagnoses: Patient Active Problem List   Diagnosis Date Noted  . Malnutrition of moderate degree 02/12/2016  . Left hip pain 02/11/2016  . Fracture of femoral neck, left ([place]) 02/11/2016  . Closed left hip fracture ([place]) 02/11/2016  . Dizziness 12/31/2015  . Exertional dyspnea 12/31/2015  . Obstructive chronic bronchitis with exacerbation ([place]) 11/13/2014  . Orthostatic hypotension 10/29/2014  . Protein-calorie malnutrition, severe ([place]) 10/28/2014  . Hypotension 10/27/2014  . SCCA (squamous cell carcinoma) of skin 10/27/2014  . Acute bronchitis 10/15/2014  . Encounter for feeding tube placement 09/17/2014  . Anorexia 08/16/2014  . Dysphagia   . DNR (do not resuscitate) discussion 07/22/2014  . Palliative care encounter 07/22/2014  . Loss of weight 07/22/2014  . 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 Polycythemia, secondary 01/10/2014  . 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 MGUS (monoclonal gammopathy of unknown significance) 04/11/2013  . Overweight (BMI 25.0-29.9) 03/20/2012  . Other and unspecified hyperlipidemia 02/24/2012  . PVC's (premature ventricular contractions) 04/05/2011  . TIA (transient ischemic attack) 10/12/2010  . SPINAL STENOSIS, LUMBAR 07/28/2010  .  Hypothyroidism 02/14/2008  . MITRAL REGURGITATION 02/14/2008  . Essential hypertension 02/14/2008  . GERD 02/14/2008    Orientation RESPIRATION BLADDER Height & Weight     Time, Situation, Place, Self  Normal Continent Weight: 150 lb (68 kg) Height:  5\' 10"  (177.8 cm)  BEHAVIORAL SYMPTOMS/MOOD NEUROLOGICAL BOWEL NUTRITION STATUS      Continent    AMBULATORY STATUS COMMUNICATION OF NEEDS Skin   Limited Assist Verbally Normal                       Personal Care Assistance Level of Assistance  Bathing Bathing Assistance: Maximum assistance         Functional Limitations Info             SPECIAL CARE FACTORS FREQUENCY                       Contractures      Additional Factors Info                  Current Medications (02/15/2016):  This is the current hospital active medication list Current Facility-Administered Medications  Medication Dose Route Frequency Provider Last Rate Last Dose  . 0.9 %  sodium chloride infusion   Intravenous Continuous [name], MD 10 mL/hr at 02/13/16 2100    . acetaminophen (TYLENOL) tablet 650 mg  650 mg Oral Q6H PRN [name], MD       Or  . acetaminophen (TYLENOL) suppository 650 mg  650 mg Rectal Q6H PRN [name], MD      . alum & mag hydroxide-simeth (MAALOX/MYLANTA) 200-200-20 MG/5ML suspension  30 mL  30 mL Oral Q4H PRN [name], MD      . aspirin EC tablet 81 mg  81 mg Oral Daily Ivor Costa, MD   81 mg at 02/15/16 0800  . docusate sodium (COLACE) capsule 100 mg  100 mg Oral BID PRN Ivor Costa, MD      . docusate sodium (COLACE) capsule 100 mg  100 mg Oral BID [name], MD   100 mg at 02/15/16 0801  . enoxaparin (LOVENOX) injection 40 mg  40 mg Subcutaneous Q24H [name], MD   40 mg at 02/15/16 0801  . feeding supplement (ENSURE ENLIVE) (ENSURE ENLIVE) liquid 237 mL  237 mL Oral BID BM Bonnielee Haff, MD   237 mL at 02/15/16 0801  . HYDROcodone-acetaminophen (NORCO/VICODIN) 5-325 MG per tablet 1-2  tablet  1-2 tablet Oral Q6H PRN [name], MD   2 tablet at 02/14/16 2103  . lactated ringers infusion   Intravenous Continuous Roderic Palau, MD      . levothyroxine (SYNTHROID, LEVOTHROID) tablet 125 mcg  125 mcg Oral QAC breakfast Ivor Costa, MD   125 mcg at 02/15/16 0800  . menthol-cetylpyridinium (CEPACOL) lozenge 3 mg  1 lozenge Oral PRN [name], MD       Or  . phenol (CHLORASEPTIC) mouth spray 1 spray  1 spray Mouth/Throat PRN [name], MD      . methocarbamol (ROBAXIN) tablet 500 mg  500 mg Oral Q6H PRN [name], MD       Or  . methocarbamol (ROBAXIN) 500 mg in dextrose 5 % 50 mL IVPB  500 mg Intravenous Q6H PRN [name], MD      . metoCLOPramide (REGLAN) tablet 5-10 mg  5-10 mg Oral Q8H PRN [name], MD       Or  . metoCLOPramide (REGLAN) injection 5-10 mg  5-10 mg Intravenous Q8H PRN [name], MD      . morphine 2 MG/ML injection 0.5 mg  0.5 mg Intravenous Q2H PRN [name], MD      . morphine 2 MG/ML injection 1 mg  1 mg Intravenous Q3H PRN Ivor Costa, MD   1 mg at 02/11/16 2349  . ondansetron (ZOFRAN) tablet 4 mg  4 mg Oral Q6H PRN [name], MD       Or  . ondansetron [facility]) injection 4 mg  4 mg Intravenous Q6H PRN [name], MD      . oxyCODONE-acetaminophen (PERCOCET/ROXICET) 5-325 MG per tablet 1 tablet  1 tablet Oral Q4H PRN Ivor Costa, MD   1 tablet at 02/14/16 0856  . polyethylene glycol (MIRALAX / GLYCOLAX) packet 17 g  17 g Oral Daily PRN [name], MD         Discharge Medications: Please see discharge summary for a list of discharge medications.  Relevant Imaging Results:  Relevant Lab Results:   Additional Information    Venetia Maxon, [place]

## 2016-02-15 NOTE — Discharge Instructions (Signed)
Hip Fracture A hip fracture is a fracture of the upper part of your thigh bone (femur).  CAUSES A hip fracture is caused by a direct blow to the side of your hip. This is usually the result of a fall but can occur in other circumstances, such as an automobile accident. RISK FACTORS There is an increased risk of hip fractures in people with:  An unsteady walking pattern (gait) and those with conditions that contribute to poor balance, such as Parkinson's disease or dementia.  Osteopenia and osteoporosis.  Cancer that spreads to the leg bones.  Certain metabolic diseases. SYMPTOMS  Symptoms of hip fracture include:  Pain over the injured hip.  Inability to put weight on the leg in which the fracture occurred (although, some patients are able to walk after a hip fracture).  Toes and foot of the affected leg point outward when you lie down. DIAGNOSIS A physical exam can determine if a hip fracture is likely to have occurred. X-ray exams are needed to confirm the fracture and to look for other injuries. The X-ray exam can help to determine the type of hip fracture. Rarely, the fracture is not visible on an X-ray image and a CT scan or MRI will have to be done. TREATMENT  The treatment for a fracture is usually surgery. This means using a screw, nail, or rod to hold the bones in place.  HOME CARE INSTRUCTIONS Take all medicines as directed by your health care provider. SEEK MEDICAL CARE IF: Pain continues, even after taking pain medicine. MAKE SURE YOU:  Understand these instructions.   Will watch your condition.  Will get help right away if you are not doing well or get worse.   This information is not intended to replace advice given to you by your health care provider. Make sure you discuss any questions you have with your health care provider.   Document Released: 05/23/2005 Document Revised: 05/28/2013 Document Reviewed: 01/02/2013 Elsevier Interactive Patient Education 2016  Elsevier Inc.  

## 2016-02-15 NOTE — Progress Notes (Addendum)
SW reached out to [facility] and made arrangements for transportation to take the pt back to the facility at 6:00pm. Admissions states that she will have a bed ready for patient by 6:00pm. Nurse made aware. Patient made aware.  Nurse Report Number : [phone] [place], MSW [phone]

## 2016-02-15 NOTE — Discharge Summary (Signed)
[facility]  Physician Discharge Summary   Patient ID: Adrian West MRN: [id number] DOB/AGE: [name] 80 y.o.  Admit date: 02/11/2016 Discharge date: 02/15/2016  PCP: [name], MD  DISCHARGE DIAGNOSES:  Principal Problem:   Fracture of femoral neck, left ([place]) Active Problems:   Hypothyroidism   Essential hypertension   TIA (transient ischemic attack)   Malnutrition of moderate degree   RECOMMENDATIONS FOR OUTPATIENT FOLLOW UP: 1. Partial weightbearing on the left leg  DISCHARGE CONDITION: fair  Diet recommendation: Regular  Filed Weights   02/11/16 1544  Weight: 68 kg (150 lb)    INITIAL HISTORY: 82 year old Caucasian male with a past medical history of hypertension, GERD, hypothyroidism, TIA, MGUS, history of salivary gland malignancy, right-sided Bell's palsy, interstitial lung disease who presented after a fall, complaining of left hip pain. Patient was found to have a hip fracture and was hospitalized for further management.  Consultations:  Orthopedics  Procedures: 9/9: Closed reduction and percutaneous cannulated screw fixation, left femoral neck fracture utilizing Biomet 8.0 mm cannulated screws with a 40 mm thread x3.  HOSPITAL COURSE:   Fracture of left femoral neck Patient was hospitalized for further management of his hip fracture. Patient was seen by orthopedics. Since the plain x-rays were not entirely clear. He underwent MRI which did show a fracture. Patient underwent closed reduction and percutaneous screw fixation. Patient has been stable postoperatively. Patient has a history of mitral valve repair. He has no history of coronary artery disease. He had a negative stress test in 2015 and had an echocardiogram last month, which did not show any wall motion abnormalities. Ejection fraction was normal. No other significant valvular heart disease noted. He was seen by his cardiologist recently as well. As part of preoperative evaluation  he was not thought to require any further cardiac testing. DVT prophylaxis per orthopedics.  Hypothyroidism Continue home Synthroid. TSH is 6.5. However, free T4 is 0.93. Continue current management.  Bradycardia Patient has asymptomatic bradycardia. Heart rate is improved this morning. EKG did show sinus bradycardia. Patient remained stable, however, and is asymptomatic. Blood pressure is normal. Patient tells me that he does have a history of slow heart rate. Does not need any further testing at this time.   Essential hypertension  Not on any antihypertensives. He was on a beta blocker based on recent cardiology notes, however, was taken off due to issues with orthostatic hypotension.  TIA (transient ischemic attack) No acute issues. Continue aspirin.  Overall, stable. Patient wants to go back to his facility, which does offer skilled level of care. Social worker has been consulted. He is okay for discharge today.   PERTINENT LABS:  The results of significant diagnostics from this hospitalization (including imaging, microbiology, ancillary and laboratory) are listed below for reference.    Microbiology: Recent Results (from the past 240 hour(s))  Surgical PCR screen     Status: None   Collection Time: 02/12/16  1:49 AM  Result Value Ref Range Status   MRSA, PCR NEGATIVE NEGATIVE Final   Staphylococcus aureus NEGATIVE NEGATIVE Final    Comment:        The Xpert SA Assay (FDA approved for NASAL specimens in patients over 32 years of age), is one component of a comprehensive surveillance program.  Test performance has been validated by [facility] for patients greater than or equal to 60 year old. It is not intended to diagnose infection nor to guide or monitor treatment.      Labs: Basic Metabolic  Panel:  Recent Labs Lab 02/11/16 1605 02/13/16 1842 02/14/16 0443 02/15/16 0339  NA 135  --  135 136  K 4.2  --  4.1 4.5  CL 100*  --  101 101  CO2 28  --  28 29    GLUCOSE 100*  --  123* 95  BUN 15  --  12 15  CREATININE 0.90 0.66 0.76 0.80  CALCIUM 8.9  --  8.6* 8.7*   CBC:  Recent Labs Lab 02/11/16 1605 02/13/16 1842 02/14/16 0443 02/15/16 0339  WBC 7.6 6.4 5.2 4.4  NEUTROABS 6.5  --   --   --   HGB 14.4 14.2 14.3 14.4  HCT 43.8 43.4 43.7 44.2  MCV 96.7 98.2 97.5 98.0  PLT 201 183 176 177    IMAGING STUDIES Dg Chest 1 View  Result Date: 02/11/2016 CLINICAL DATA:  Status post fall last night from standing position. Patient undergoing evaluation of the left hip fracture. Preoperative study. EXAM: CHEST 1 VIEW COMPARISON:  Chest x-ray of Oct 28, 2015 FINDINGS: The patient is rotated on both images. The lungs are adequately inflated and clear. There is no pleural effusion or pneumothorax. There is a prosthetic valve ring in the mitral position. The heart is normal in size. The pulmonary vascularity is not engorged. There is calcification in the wall of the aortic arch. The observed bony thorax exhibits no acute abnormality. There are degenerative changes of both shoulders. IMPRESSION: There is no pneumonia nor CHF nor other acute cardiopulmonary abnormality. There is a prosthetic mitral valve. Aortic atherosclerosis. Electronically Signed   By: David  Martinique M.D.   On: 02/11/2016 16:52   Dg Pelvis 1-2 Views  Result Date: 02/11/2016 CLINICAL DATA:  Fall last night, left hip pain EXAM: PELVIS - 1-2 VIEW COMPARISON:  None. FINDINGS: Single frontal view of the pelvis submitted. Extensive degenerative changes are noted lower lumbar spine. There is shortening of left femoral neck with a vague lucent line highly suspicious for impacted fracture. Clinical correlation is necessary. Further correlation with CT scan or MRI is recommended. There is asymmetry of left superior and inferior pubic ramus. Although this may be positional impacted fracture cannot be excluded. Further correlation with CT scan is recommended. IMPRESSION: Extensive degenerative changes are  noted lower lumbar spine. There is shortening of left femoral neck with a vague lucent line highly suspicious for impacted fracture. Clinical correlation is necessary. Further correlation with CT scan or MRI is recommended. There is asymmetry of left superior and inferior pubic ramus. Although this may be positional impacted fracture cannot be excluded. Further correlation with CT scan is recommended. Electronically Signed   By: Lahoma Crocker M.D.   On: 02/11/2016 16:55   Dg Op Swallowing Func-medicare/speech Path  Result Date: 01/18/2016 Objective Swallowing Evaluation: Type of Study: MBS-Modified Barium Swallow Study Patient Details Name: Adrian West MRN: [id number] Date of Birth: [birth year] Today's Date: 01/18/2016 Time: SLP Start Time (ACUTE ONLY): 1245-SLP Stop Time (ACUTE ONLY): 1315 SLP Time Calculation (min) (ACUTE ONLY): 30 min Past Medical History: Past Medical History: Diagnosis Date . Anorexia 08/16/2014 . Bell's palsy  . Benign neoplasm of colon  . Cranial nerve VII palsy 01/10/2014 . Deaf  . Diaphragm paralysis  . Diverticulosis  . DNR (do not resuscitate) discussion 07/22/2014 . Dyspnea and respiratory abnormality 03/10/2014 . 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 Interstitial lung disease 10/15/13 Xray [facility]) 01/02/12 new subsegmental ATX on L (vs 09/03/11) Seeing Dr Theda Sers [facility]  Seen by Dr Corrin Parker, [facility],  02/2011. Component of thoracic height loss & alcohol related neuropathy questioned. No plication recommended  5/4- 10/14/2013 @ [facility] ; Dr  Ellis Parents.10/15/13 new focal consolidation mid anterior L lung:aspiration vs PNA . Underlying interstitial lung disease.10/15/13 Rx sent to [facility] for Doxycycline & Augmentin by Dr Jeannine Kitten ( not picked up as of 10/18/13).  Flew back to [facility];  seen @ 10/16/13 [facility], [place]. Levaquin X 5 days , Dr Evalee Mutton   . Esophageal stricture  . Gait disturbance  . GERD 02/14/2008  Qualifier: Diagnosis of  By: [name] MD, [name]   . Hiatal hernia  . History of radiation therapy 06/11/14-07/22/14  skin cancer of scalp/face . HYPERTENSION 02/14/2008     . HYPOTHYROIDISM 02/14/2008 . Interstitial lung disease ([place]) 05/05/2014 . Loss of weight 07/22/2014 . Lung abnormality 11/25/2008  "nerve damage from heart surgery" . Memory loss 12/31/2009  Qualifier: Diagnosis of  By: [name] MD, [name]   . MGUS (monoclonal gammopathy of unknown significance) 04/11/2013 . MITRAL REGURGITATION 02/14/2008  Qualifier: Diagnosis of  By: [name] MD, Cherlynn Kaiser MV surgery 11/2008, [name].complcated by paralysis of diaphragm    . Monoclonal B-cell lymphocytosis 10/22/2013  He was told of this diagnosis while he was at [facility], I have not records on this today  . Neuropathy, peripheral ([place])  . Other and unspecified hyperlipidemia 02/24/2012 . Polycythemia, secondary 01/10/2014 . PVC's (premature ventricular contractions) 04/05/2011 . 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 Skin cancer of scalp or skin of neck 05/21/2014 . SPINAL STENOSIS, LUMBAR 07/28/2010  Qualifier: Diagnosis of  By: [name] MD, [name], NS ( retired)   . Squamous carcinoma ([place])  . TIA (transient ischemic attack) May 2012 . Tremor 2015 . Tremor, essential  . Vertigo 2005 . Vertigo 2005 . Weakness generalized 07/22/2014 Past Surgical History: Past Surgical History: Procedure Laterality Date . APPENDECTOMY   . BROW LIFT Right 04/25/2014  Midforehead . CARDIAC CATHETERIZATION   . CARDIAC VALVE REPLACEMENT   . CATARACT EXTRACTION    [name] . CHOLECYSTECTOMY  1998  [name] . ECTROPION REPAIR Right 04/25/2014  Right tarsal strip . ESOPHAGEAL DILATION     X3;[name] . INGUINAL HERNIA REPAIR   .  LAPAROSCOPIC ABLATION RENAL MASS  08/14/2009 . LUMBAR LAMINECTOMY   . 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 MOHS SURGERY  2012   ear . NASAL SINUS SURGERY   . Nasolabial repair Right 04/25/2014  Right static sling using AlloDerm graft elevation of the nasolabial fold . NECK DISSECTION Right 04/25/2014 . PAROTIDECTOMY Right 04/25/2014  Sacrifice of the facial nerve . Partial auriculectomy Right 04/25/2014 . RIGHT HEART CATHETERIZATION N/A 03/03/2014  Procedure: RIGHT HEART CATH;  Surgeon: [name], MD;  Location: [facility] CATH LAB;  Service: Cardiovascular;  Laterality: N/A; . TILT TABLE STUDY N/A 05/06/2013  Procedure: TILT TABLE STUDY;  Surgeon: [name], MD;  Location: [facility] CATH LAB;  Service: Cardiovascular;  Laterality: N/A; . TONSILLECTOMY   HPI: The patient was seen for outpatient MBS to rule out aspiration.  He reported history of diaphragm paralysis following surgery in 2010.  In addition, he reported a skin cancer  s/p surgical ressection and 28 treatments of radiation.  He stated that he has had sinus issues for the last 4 months that will not clear despite treatment.  He does have a documented MBS dated 07/2014 with recommendation for a regular diet with thin liquids.  His surgery and radiation for cancer occurred after this study.   Subjective: The patient was seen in radiology for MBS to rule out aspiration.  Assessment / Plan / Recommendation CHL IP CLINICAL IMPRESSIONS 01/18/2016 Therapy Diagnosis Mild oral phase dysphagia;Moderate pharyngeal phase dysphagia Clinical Impression MBS was completed using thin liquids via spoon and cup, nectar thick liquids via spoon and cup, pureed material and dual textured solids.  Of note, the patient presented with a dense right sided facial paralysis following his surgery for cancer.  There is no labial, cheek or forehead movement.  The patient even struggles to completely close his right eye.  The patient  presented with a mild oral and moderate pharyngeal dysphagia.  During the oral phase the patient was noted to have mildly delayed mastication of dual textured solids and mild oral discoordination given pureed material.  During the pharyngeal phase the patient was noted to have a mildly delayed swallow trigger (i.e. occurred in the vallecular), mildly decreased constriction with trace to mild pharyngeal residue and decreased airway protection.  Silent penetration during the swallow was noted given thin liquids via spoon and cup sips.  Frank aspiration was noted during cup sip on  the second swallow in spontaneous attempt to clear pharyngeal residue from the pyriform sinuses.  Use of a chin tuck was intermittently helpful to prevent.  When it did not prevent the patient only demonstrated flash penetration. Of note the patient had intermittent wet cough throughout the study that was not related to immediate aspiration/penetration.    Recommend a dysphagia 3 diet with nectar thick liquids.  The patient would benefit from follow up ST to address dysphagia.    MD please order outpatient ST if you agree.  Thank you.   Impact on safety and function Mild aspiration risk   CHL IP TREATMENT RECOMMENDATION 07/22/2014 Treatment Recommendations Defer treatment plan to SLP at (Comment)   Prognosis 01/18/2016 Prognosis for Safe Diet Advancement Fair Barriers to Reach Goals -- Barriers/Prognosis Comment -- CHL IP DIET RECOMMENDATION 01/18/2016 SLP Diet Recommendations Dysphagia 3 (Mech soft) solids;Nectar thick liquid Liquid Administration via Cup;No straw Medication Administration Whole meds with puree Compensations Slow rate;Small sips/bites Postural Changes --   No flowsheet data found.  CHL IP FOLLOW UP RECOMMENDATIONS 01/18/2016 Follow up Recommendations Outpatient SLP   No flowsheet data found.     CHL IP ORAL PHASE 01/18/2016 Oral Phase Impaired Oral - Pudding Teaspoon -- Oral - Pudding Cup -- Oral - Honey Teaspoon -- Oral - Honey  Cup -- Oral - Nectar Teaspoon -- Oral - Nectar Cup -- Oral - Nectar Straw -- Oral - Thin Teaspoon Right anterior bolus loss Oral - Thin Cup -- Oral - Thin Straw -- Oral - Puree (No Data) Oral - Mech Soft -- Oral - Regular -- Oral - Multi-Consistency Impaired mastication Oral - Pill -- Oral Phase - Comment --  CHL IP PHARYNGEAL PHASE 01/18/2016 Pharyngeal Phase Impaired Pharyngeal- Pudding Teaspoon -- Pharyngeal -- Pharyngeal- Pudding Cup -- Pharyngeal -- Pharyngeal- Honey Teaspoon -- Pharyngeal -- Pharyngeal- Honey Cup -- Pharyngeal -- Pharyngeal- Nectar Teaspoon Delayed swallow initiation-vallecula;Pharyngeal residue - valleculae;Pharyngeal residue - pyriform Pharyngeal -- Pharyngeal- Nectar Cup Delayed swallow initiation-vallecula;Pharyngeal residue - pyriform;Pharyngeal residue - valleculae  Pharyngeal -- Pharyngeal- Nectar Straw -- Pharyngeal -- Pharyngeal- Thin Teaspoon Delayed swallow initiation-vallecula;Pharyngeal residue - valleculae;Pharyngeal residue - pyriform;Penetration/Aspiration during swallow Pharyngeal Material enters airway, remains ABOVE vocal cords and not ejected out Pharyngeal- Thin Cup Delayed swallow initiation-vallecula;Penetration/Aspiration during swallow;Trace aspiration Pharyngeal Material enters airway, passes BELOW cords without attempt by patient to eject out (silent aspiration) Pharyngeal- Thin Straw -- Pharyngeal -- Pharyngeal- Puree Delayed swallow initiation-vallecula;Pharyngeal residue - valleculae;Pharyngeal residue - pyriform Pharyngeal -- Pharyngeal- Mechanical Soft -- Pharyngeal -- Pharyngeal- Regular -- Pharyngeal -- Pharyngeal- Multi-consistency Delayed swallow initiation-vallecula Pharyngeal -- Pharyngeal- Pill -- Pharyngeal -- Pharyngeal Comment --  No flowsheet data found. CHL IP GO 01/18/2016 Functional Assessment Tool Used ASHA NOMS and clinical judgment.   Functional Limitations Swallowing Swallow Current Status [phone]) CJ Swallow Goal Status [id number]) CJ Swallow  Discharge Status [phone]) CJ Motor Speech Current Status [phone]) (None) Motor Speech Goal Status [phone]) (None) Motor Speech Goal Status [phone]) (None) Spoken Language Comprehension Current Status [phone]) (None) Spoken Language Comprehension Goal Status [id number]) (None) Spoken Language Comprehension Discharge Status [phone]) (None) Spoken Language Expression Current Status [phone]) (None) Spoken Language Expression Goal Status [id number]) (None) Spoken Language Expression Discharge Status [phone]) (None) Attention Current Status [id number]) (None) Attention Goal Status [id number]) (None) Attention Discharge Status [phone]) (None) Memory Current Status [id number]) (None) Memory Goal Status [id number]) (None) Memory Discharge Status [id number]) (None) Voice Current Status [id number]) (None) Voice Goal Status [id number]) (None) Voice Discharge Status [phone]) (None) Other Speech-Language Pathology Functional Limitation [phone]) (None) Other Speech-Language Pathology Functional Limitation Goal Status [id number]) (None) Other Speech-Language Pathology Functional Limitation Discharge Status [phone]) (None) Shelly Flatten, MA, CCC-SLP Acute Rehab SLP [phone] Lamar Sprinkles 01/18/2016, 1:55 PM              Mr Hip Left Wo Contrast  Result Date: 02/11/2016 CLINICAL DATA:  Left femoral neck fracture. EXAM: MR OF THE LEFT HIP WITHOUT CONTRAST TECHNIQUE: Multiplanar, multisequence MR imaging was performed. No intravenous contrast was administered. COMPARISON:  02/11/2016 FINDINGS: Bones: Nondisplaced left femoral neck fracture with associated marrow edema. Cortical discontinuity is more obvious laterally rather than medially. Lower lumbar spondylosis and degenerative disc disease. Articular cartilage and labrum Articular cartilage:  Mild chondral thinning in both hips. Labrum:  Grossly unremarkable Joint or bursal effusion Joint effusion:  Moderate left hip joint effusion. Bursae:  Small amounts of fluid deep to both iliacus muscles. Muscles and tendons Muscles and  tendons: Low-level edema in the soft tissues around the left hip joint. Iliopsoas tendons normal and symmetric. Other findings Miscellaneous: Sigmoid colon diverticulosis without active diverticulitis. Left scrotal hydrocele potentially up to the 130 cc. IMPRESSION: 1. Nondisplaced acute left femoral neck fracture with associated marrow edema and hip effusion. 2. Left scrotal hydrocele potentially up to 130 cc in volume. 3. Trace fluid deep to both iliacus muscles. 4. Mild chondral thinning in both hips. 5. Lower lumbar spondylosis and degenerative disc disease. 6. Sigmoid colon diverticulosis. Electronically Signed   By: Van Clines M.D.   On: 02/11/2016 23:45   Dg C-arm 1-60 Min  Result Date: 02/13/2016 CLINICAL DATA:  Left hip pinning. EXAM: OPERATIVE left HIP (WITH PELVIS IF PERFORMED) 2 VIEWS TECHNIQUE: Fluoroscopic spot image(s) were submitted for interpretation post-operatively. FLUOROSCOPY TIME:  1 minutes 6 seconds. COMPARISON:  Radiographs of April 13, 2015. FINDINGS: These images demonstrate internal pinning of proximal left femoral neck fracture. Good alignment of fracture components is noted. IMPRESSION: Status post surgical internal fixation of proximal left femoral neck fracture. Electronically Signed   By: Jeneen Rinks  Murlean Caller, M.D.   On: 02/13/2016 13:51   Dg Hip Operative Unilat W Or W/o Pelvis Left  Result Date: 02/13/2016 CLINICAL DATA:  Left hip pinning. EXAM: OPERATIVE left HIP (WITH PELVIS IF PERFORMED) 2 VIEWS TECHNIQUE: Fluoroscopic spot image(s) were submitted for interpretation post-operatively. FLUOROSCOPY TIME:  1 minutes 6 seconds. COMPARISON:  Radiographs of April 13, 2015. FINDINGS: These images demonstrate internal pinning of proximal left femoral neck fracture. Good alignment of fracture components is noted. IMPRESSION: Status post surgical internal fixation of proximal left femoral neck fracture. Electronically Signed   By: Marijo Conception, M.D.   On: 02/13/2016 13:51     Dg Femur Min 2 Views Left  Result Date: 02/11/2016 CLINICAL DATA:  Left hip pain after fall at nursing facility. EXAM: LEFT FEMUR 2 VIEWS COMPARISON:  None. FINDINGS: Almyra Brace is seen in left femoral neck concerning for nondisplaced fracture. MRI may be performed for further evaluation. Remaining portion of left femur appears normal. IMPRESSION: Linear lucency seen in left femoral neck concerning for nondisplaced fracture. MRI may be performed for further evaluation. Electronically Signed   By: Marijo Conception, M.D.   On: 02/11/2016 16:54    DISCHARGE EXAMINATION: Vitals:   02/14/16 1355 02/14/16 2110 02/15/16 0400 02/15/16 0442  BP: (!) 105/54 (!) 118/54  (!) 98/50  Pulse: 68 60  65  Resp: 18  16   Temp: 97.7 F (36.5 C) 97.9 F (36.6 C)    TempSrc: Oral Oral    SpO2: 98% 100%  99%  Weight:      Height:       General appearance: alert, cooperative, appears stated age and no distress Resp: clear to auscultation bilaterally Cardio: regular rate and rhythm, S1, S2 normal, no murmur, click, rub or gallop GI: soft, non-tender; bowel sounds normal; no masses,  no organomegaly Extremities: No bruising noted over the left thigh area  DISPOSITION: SNF  Discharge Instructions    Call MD for:  difficulty breathing, headache or visual disturbances    Complete by:  As directed   Call MD for:  extreme fatigue    Complete by:  As directed   Call MD for:  persistant dizziness or light-headedness    Complete by:  As directed   Call MD for:  persistant nausea and vomiting    Complete by:  As directed   Call MD for:  redness, tenderness, or signs of infection (pain, swelling, redness, odor or green/yellow discharge around incision site)    Complete by:  As directed   Call MD for:  severe uncontrolled pain    Complete by:  As directed   Call MD for:  temperature >100.4    Complete by:  As directed   Discharge instructions    Complete by:  As directed   Please see instructions by the orthopedic  surgeon.   You were cared for by a hospitalist during your hospital stay. If you have any questions about your discharge medications or the care you received while you were in the hospital after you are discharged, you can call the unit and asked to speak with the hospitalist on call if the hospitalist that took care of you is not available. Once you are discharged, your primary care physician will handle any further medical issues. Please note that NO REFILLS for any discharge medications will be authorized once you are discharged, as it is imperative that you return to your primary care physician (or establish a relationship with a primary  care physician if you do not have one) for your aftercare needs so that they can reassess your need for medications and monitor your lab values. If you do not have a primary care physician, you can call [phone] for a physician referral.   Increase activity slowly    Complete by:  As directed   Partial weight bearing    Complete by:  As directed   % Body Weight:  50   Laterality:  left   Extremity:  Lower      ALLERGIES:  Allergies  Allergen Reactions  . Plavix [Clopidogrel] Other (See Comments)    Cannot take med     Current Discharge Medication List    START taking these medications   Details  acetaminophen (TYLENOL) 325 MG tablet Take 2 tablets (650 mg total) by mouth every 6 (six) hours as needed for mild pain (or Fever >/= 101).    docusate sodium (COLACE) 100 MG capsule Take 1 capsule (100 mg total) by mouth 2 (two) times daily. Qty: 10 capsule, Refills: 0    enoxaparin (LOVENOX) 40 MG/0.4ML injection Inject 0.4 mLs (40 mg total) into the skin daily. Qty: 14 Syringe, Refills: 0    feeding supplement, ENSURE ENLIVE, (ENSURE ENLIVE) LIQD Take 237 mLs by mouth 2 (two) times daily between meals. Qty: 237 mL, Refills: 12    ferrous sulfate 325 (65 FE) MG tablet Take 1 tablet (325 mg total) by mouth 2 (two) times daily with a meal. For 2  weeks Refills: 3    HYDROcodone-acetaminophen (NORCO/VICODIN) 5-325 MG tablet Take 1-2 tablets by mouth every 6 (six) hours as needed for moderate pain. Qty: 80 tablet, Refills: 0    polyethylene glycol (MIRALAX / GLYCOLAX) packet Take 17 g by mouth daily as needed for mild constipation. Qty: 14 each, Refills: 0      CONTINUE these medications which have NOT CHANGED   Details  aspirin EC 81 MG tablet Take 81 mg by mouth daily.    levothyroxine (SYNTHROID, LEVOTHROID) 125 MCG tablet Take 125 mcg by mouth daily before breakfast.      STOP taking these medications     furosemide (LASIX) 20 MG tablet      potassium chloride (K-DUR) 10 MEQ tablet        Follow-up Information    Mauri Pole, MD. Schedule an appointment as soon as possible for a visit in 2 week(s).   Specialty:  Orthopedic Surgery Contact information: [address] [place] [address] [phone]        [name], MD Follow up in 3 week(s).   Specialty:  Internal Medicine Contact information: [address] [place] [address] [phone]           TOTAL DISCHARGE TIME: 76 minutes  [facility] Pager [phone]  02/15/2016, 11:12 AM

## 2016-02-15 NOTE — Progress Notes (Signed)
Reviewed discharge information/medications with patient. Answered all of his questions.  Patient is ready to be discharged to [facility].

## 2016-02-15 NOTE — Care Management Note (Signed)
Case Management Note  Patient Details  Name: Adrian West MRN: [id number] Date of Birth: 02/13/1928  Subjective/Objective: Pt presented for hip fracture. Pt is from [facility] and will return to SNF side.                    Action/Plan: CSW assisting with disposition needs. No further needs from CM at this time.   Expected Discharge Date:                  Expected Discharge Plan:  Skilled Nursing Facility  In-House Referral:  Clinical Social Work  Discharge planning Services  CM Consult  Post Acute Care Choice:  NA Choice offered to:  NA  DME Arranged:  N/A DME Agency:  NA  HH Arranged:  NA HH Agency:  NA  Status of Service:  Completed, signed off  If discussed at [place] of Stay Meetings, dates discussed:    Additional Comments:  Bethena Roys, RN 02/15/2016, 3:05 PM

## 2016-02-15 NOTE — Progress Notes (Signed)
Called report to [facility].

## 2016-02-15 NOTE — Care Management Important Message (Signed)
Important Message  Patient Details  Name: Adrian West MRN: [id number] Date of Birth: [birth year]   Medicare Important Message Given:  Yes    [name] 02/15/2016, 3:04 PM

## 2016-02-15 NOTE — Progress Notes (Signed)
SW reached out to admissions at [facility] to inquire if pt would be welcomed back today. However, there was no answer. SW left a message and will continue to follow up.  Tilda Burrow, MSW

## 2016-02-15 NOTE — Progress Notes (Signed)
Physical Therapy Treatment Patient Details Name: Adrian West MRN: [id number] DOB: 1928-03-23 Today's Date: 02/15/2016    History of Present Illness 82 year old Caucasian male with a past medical history of hypertension, GERD, hypothyroidism, TIA, MGUS, history of salivary gland malignancy, right-sided Bell's palsy, interstitial lung disease who presented after a fall, complaining of left hip pain. Now s/p CANNULATED LEFT HIP PINNING (Left) on 9/9.     PT Comments    Pt performed increased mobility, including exercise in supine position and increased gait distance.  Pt remains to benefit from rehab in a post acute setting.  Pt presents with productive cough during session.  RN informed O2 sats 94%.  RN to set up yonker post tx.    Follow Up Recommendations        Equipment Recommendations       Recommendations for Other Services       Precautions / Restrictions Precautions Precautions: Fall Restrictions Weight Bearing Restrictions: Yes LLE Weight Bearing: Partial weight bearing LLE Partial Weight Bearing Percentage or Pounds: 50%    Mobility  Bed Mobility Overal bed mobility: Needs Assistance Bed Mobility: Supine to Sit     Supine to sit: Min assist     General bed mobility comments: Cues for sequencing and assist to use PTA as a rail.    Transfers Overall transfer level: Needs assistance Equipment used: Rolling walker (2 wheeled) Transfers: Sit to/from Stand Sit to Stand: Min assist         General transfer comment: Cues for hand placement to push from seated surface.  Ambulation/Gait Ambulation/Gait assistance: Mod assist;Min assist Ambulation Distance (Feet): 54 Feet Assistive device: Rolling walker (2 wheeled) Gait Pattern/deviations: Step-to pattern;Antalgic;Trunk flexed     General Gait Details: Cues for sequencing and upper trunk control.  PTA required assist to keep RW still when advancing steps forward.  Cues for L heel strike in stance phase as  minor L knee instability noted.     Stairs            Wheelchair Mobility    Modified Rankin (Stroke Patients Only)       Balance Overall balance assessment: Needs assistance Sitting-balance support: Feet supported;No upper extremity supported Sitting balance-Leahy Scale: Good       Standing balance-Leahy Scale: Poor                      Cognition Arousal/Alertness: Awake/alert Behavior During Therapy: WFL for tasks assessed/performed Overall Cognitive Status: Within Functional Limits for tasks assessed                      Exercises Total Joint Exercises Ankle Circles/Pumps: AROM;Both;10 reps;Supine Quad Sets: AROM;Left;10 reps;Supine Short Arc Quad: AROM;Left;10 reps;Supine Heel Slides: AAROM;Left;10 reps;Supine Hip ABduction/ADduction: AAROM;Left;10 reps;Supine    General Comments        Pertinent Vitals/Pain Pain Assessment: 0-10 Pain Score: 4  Pain Location: L hip Pain Descriptors / Indicators: Sore Pain Intervention(s): Monitored during session;Repositioned    Home Living                      Prior Function            PT Goals (current goals can now be found in the care plan section) Acute Rehab PT Goals Patient Stated Goal: get better and return home Potential to Achieve Goals: Good Progress towards PT goals: Progressing toward goals    Frequency  Min 3X/week    PT  Plan Current plan remains appropriate    Co-evaluation             End of Session Equipment Utilized During Treatment: Gait belt Activity Tolerance: Patient limited by pain Patient left: in chair;with call bell/phone within reach     Time: 1143-1203 PT Time Calculation (min) (ACUTE ONLY): 20 min  Charges:  $Gait Training: 8-22 mins                    G Codes:      Adrian West Mar 09, 2016, 12:49 PM  Adrian West, PTA pager [phone]

## 2016-02-16 ENCOUNTER — Encounter: Payer: Self-pay | Admitting: Internal Medicine

## 2016-02-16 ENCOUNTER — Non-Acute Institutional Stay (SKILLED_NURSING_FACILITY): Payer: Medicare Other | Admitting: Internal Medicine

## 2016-02-16 DIAGNOSIS — R1312 Dysphagia, oropharyngeal phase: Secondary | ICD-10-CM

## 2016-02-16 DIAGNOSIS — D472 Monoclonal gammopathy: Secondary | ICD-10-CM | POA: Diagnosis not present

## 2016-02-16 DIAGNOSIS — E039 Hypothyroidism, unspecified: Secondary | ICD-10-CM

## 2016-02-16 DIAGNOSIS — E440 Moderate protein-calorie malnutrition: Secondary | ICD-10-CM | POA: Diagnosis not present

## 2016-02-16 DIAGNOSIS — R630 Anorexia: Secondary | ICD-10-CM | POA: Diagnosis not present

## 2016-02-16 DIAGNOSIS — G510 Bell's palsy: Secondary | ICD-10-CM | POA: Diagnosis not present

## 2016-02-16 DIAGNOSIS — S72002A Fracture of unspecified part of neck of left femur, initial encounter for closed fracture: Secondary | ICD-10-CM

## 2016-02-16 DIAGNOSIS — R001 Bradycardia, unspecified: Secondary | ICD-10-CM

## 2016-02-16 DIAGNOSIS — C089 Malignant neoplasm of major salivary gland, unspecified: Secondary | ICD-10-CM

## 2016-02-16 NOTE — Progress Notes (Signed)
Patient ID: Adrian West, male   DOB: May 04, 1928, 82 y.o.   MRN: [id number]  Provider:  Rexene Edison. Mariea Clonts, D.O., C.M.D. Location:   [place] Room Number: Grants of Service:  SNF (31)  PCP: [name], MD Patient Care Team: [name], MD as PCP - General (Internal Medicine) [name], MD as Attending Physician (Internal Medicine) [name], MD as Consulting Physician (Otolaryngology) [name], MD as Consulting Physician (Ophthalmology) [name], MD as Consulting Physician (Cardiology) [name], DO as Consulting Physician (Neurology) [name], MD as Referring Physician (Otolaryngology) [name], MD as Attending Physician (Radiation Oncology) [name], RN as Oncology Nurse Navigator Brand Males, MD as Consulting Physician (Pulmonary Disease) [name], MD as Referring Physician (Otolaryngology) [name], MD as Consulting Physician (Dermatology)  Extended Emergency Contact Information Primary Emergency Contact: Abe People of [place] Phone: [phone] Mobile Phone: [phone] Relation: Daughter Secondary Emergency Contact: Beaudoin,[name] Address: [address], [place] Phone: [phone] Relation: Spouse  Code Status: DNR Goals of Care: Advanced Directive information Advanced Directives 02/16/2016  Does patient have an advance directive? Yes  Type of Paramedic of [place];Living will  Does patient want to make changes to advanced directive? -  Copy of advanced directive(s) in chart? Yes  Would patient like information on creating an advanced directive? -   Chief Complaint  Patient presents with  . New Admit To SNF    rehab admit    HPI: Patient is a 82 y.o. male known to me from prior stays in rehab with h/o salivary gland malignancy, MGUS, hypothyroidism, TIA, right Bell's palsy,  interstitial lung disease and malnutrition  seen today for admission to rehab s/p hospital admission for closed left hip fx 9/7-9/11/17. He sustained a fall after experiencing pain in his left hip and the hip giving way.  He was found to have a nondisplaced acute left femoral neck fracture with associated marrow edema and hip effusion.  He underwent closed reduction and percutaneous cannulated screw fixation on 02/13/16 by Dr. Alvan Dame.    He was continued on his home synthroid with slight elevation of his TSH with normal free T4.  He had a period of asymptomatic sinus bradycardia which he indicates is a longstanding finding.  His bp was wnl and he had difficulty with orthostatic hypotension his last rehab stay and the beta blocker was discontinued.    He remains on his baby asa for prior TIA.  When seen, he reported less pain than he expected after such a surgery.  He is doing well with little pain medication at this point.  He is to see Dr. Alvan Dame in 2 wks from his hospital d/c.  He is eagerly awaiting PT but [facility] was out of network so he is going to have to pay out of pocket.  He realizes he needs the therapy to get back home.    His appetite remains very poor and he has no taste when eating most items ever since his radiation therapy.  He also had a swallow study done during his hospitalization with [facility] Diet Recommendations of Dysphagia 3 (Mech soft) solids;Nectar thick liquid Liquid Administration via Cup;No straw Medication Administration Whole meds with puree Compensations Slow rate;Small sips/bites and f/u outpatient ST. He had oral phase dysphagia impaired on multiple consistencies, pharyngeal phase dysphagia with silent aspiration and delayed  swallow initiation with multiple consistencies.  He ate maybe 20% of his lunch when I arrived and he didn't want anymore.   Past Medical History:  Diagnosis Date  . Anorexia 08/16/2014  . Bell's palsy   . Benign neoplasm of colon   . 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 Deaf   . Diaphragm paralysis   . Diverticulosis   . DNR (do not resuscitate) discussion 07/22/2014  . Dyspnea and respiratory abnormality 03/10/2014  . 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 Interstitial lung disease 10/15/13 Xray [facility]) 01/02/12 new subsegmental ATX on L (vs 09/03/11) Seeing Dr Theda Sers [facility]  Seen by Dr Corrin Parker, [facility],  02/2011. Component of thoracic height loss & alcohol related neuropathy questioned. No plication recommended  5/4- 10/14/2013 @ [facility] ; Dr  Ellis Parents.10/15/13 new focal consolidation mid anterior L lung:aspiration vs PNA . Underlying interstitial lung disease.10/15/13 Rx sent to [facility] for Doxycycline & Augmentin by Dr Jeannine Kitten ( not picked up as of 10/18/13).  Flew back to [facility]; seen @ 10/16/13 [facility], [place]. Levaquin X 5 days , Dr Evalee Mutton    . Esophageal stricture   . Gait disturbance   . GERD 02/14/2008   Qualifier: Diagnosis of  By: [name] MD, [name]    . Hiatal hernia   . History of radiation therapy 06/11/14-07/22/14   skin cancer of scalp/face  . HYPERTENSION 02/14/2008       . HYPOTHYROIDISM 02/14/2008  . Interstitial lung disease ([place]) 05/05/2014  . Loss of weight 07/22/2014  . Lung abnormality 11/25/2008   "nerve damage from heart surgery"  . Memory loss 12/31/2009   Qualifier: Diagnosis of  By: [name] MD, [name]    . MGUS (monoclonal gammopathy of unknown significance) 04/11/2013  . MITRAL REGURGITATION 02/14/2008   Qualifier: Diagnosis of  By: [name] MD, Cherlynn Kaiser MV surgery 11/2008, [name].complcated by paralysis of diaphragm     . 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 Neuropathy,  peripheral ([place])   . Other and unspecified hyperlipidemia 02/24/2012  . Polycythemia, secondary 01/10/2014  . PVC's (premature ventricular contractions) 04/05/2011  . Radiation 06/11/14- 07/22/14   Right parotid bed, posterior auricular, right upper neck.  . 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 Skin cancer of scalp or skin of neck 05/21/2014  . SPINAL STENOSIS, LUMBAR 07/28/2010   Qualifier: Diagnosis of  By: [name] MD, [name], NS ( retired)    . Squamous carcinoma ([place])   . TIA (transient ischemic attack) May 2012  . Tremor 2015  . Tremor, essential   . Vertigo 2005  . Vertigo 2005  . Weakness generalized 07/22/2014   Past Surgical History:  Procedure Laterality Date  . APPENDECTOMY    . BROW LIFT Right 04/25/2014   Midforehead  . CARDIAC CATHETERIZATION    . CARDIAC VALVE REPLACEMENT    . CATARACT EXTRACTION     [name]  . CHOLECYSTECTOMY  1998   [name]  . ECTROPION REPAIR Right 04/25/2014   Right tarsal strip  . ESOPHAGEAL DILATION      X3;Dr  Henrene Pastor  . HIP PINNING,CANNULATED Left 02/13/2016   Procedure: CANNULATED LEFT HIP PINNING;  Surgeon: [name], MD;  Location: [place];  Service: Orthopedics;  Laterality: Left;  . INGUINAL HERNIA REPAIR    . LAPAROSCOPIC ABLATION RENAL MASS  08/14/2009  . LUMBAR LAMINECTOMY    . 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 MOHS SURGERY  2012    ear  . NASAL SINUS SURGERY    . Nasolabial repair Right 04/25/2014   Right static sling using AlloDerm graft elevation of the nasolabial fold  . NECK DISSECTION Right 04/25/2014  . PAROTIDECTOMY Right 04/25/2014   Sacrifice of the facial nerve  . Partial auriculectomy Right 04/25/2014  . RIGHT HEART CATHETERIZATION N/A 03/03/2014   Procedure: RIGHT HEART CATH;  Surgeon: [name], MD;  Location: [facility] CATH LAB;  Service:  Cardiovascular;  Laterality: N/A;  . TILT TABLE STUDY N/A 05/06/2013   Procedure: TILT TABLE STUDY;  Surgeon: [name], MD;  Location: [facility] CATH LAB;  Service: Cardiovascular;  Laterality: N/A;  . TONSILLECTOMY      reports that he has never smoked. He has never used smokeless tobacco. He reports that he does not drink alcohol or use drugs. Social History   Social History  . Marital status: Married    Spouse name: N/A  . Number of children: N/A  . Years of education: N/A   Occupational History  . retired Equities trader     Social History Main Topics  . Smoking status: Never Smoker  . Smokeless tobacco: Never Used  . Alcohol use No     Comment: Drinks wine 3 per night  . Drug use: No  . Sexual activity: Not Currently   Other Topics Concern  . 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   Married x 53 years.  They have three daughters.    Functional Status Survey: Is the patient deaf or have difficulty hearing?: Yes Does the patient have difficulty seeing, even when wearing glasses/contacts?: Yes Does the patient have difficulty concentrating, remembering, or making decisions?: No Does the patient have difficulty walking or climbing stairs?: Yes Does the patient have difficulty dressing or bathing?: Yes Does the patient have difficulty doing errands alone such as visiting a doctor's office or shopping?: No  Family History  Problem Relation Age of Onset  . Colon cancer Mother 80  . Heart attack Father 98  . Heart attack Maternal Grandmother 60  . Uterine cancer Paternal Grandmother   . Healthy Sister   . Stroke Neg Hx   . Diabetes Neg Hx     Health Maintenance  Topic Date Due  . ZOSTAVAX  02/20/1990  . PNA vac Low Risk Adult (2 of 2 - PCV13) 04/06/2014  . TETANUS/TDAP  08/18/2015  . INFLUENZA VACCINE  01/05/2016    Allergies  Allergen Reactions  . Plavix [Clopidogrel] Other (See  Comments)    Cannot take med      Medication List       Accurate as of 02/16/16 11:59 PM. Always use your most recent med list.          acetaminophen 325 MG tablet Commonly known as:  TYLENOL Take 2 tablets (650 mg total) by mouth every 6 (six) hours as needed for mild pain (or Fever >/= 101).   docusate sodium 100 MG capsule Commonly known  as:  COLACE Take 1 capsule (100 mg total) by mouth 2 (two) times daily.   enoxaparin 40 MG/0.4ML injection Commonly known as:  LOVENOX Inject 0.4 mLs (40 mg total) into the skin daily.   feeding supplement (ENSURE ENLIVE) Liqd Take 237 mLs by mouth 2 (two) times daily between meals.   ferrous sulfate 325 (65 FE) MG tablet Take 1 tablet (325 mg total) by mouth 2 (two) times daily with a meal. For 2 weeks   HYDROcodone-acetaminophen 5-325 MG tablet Commonly known as:  NORCO/VICODIN Take 1-2 tablets by mouth every 6 (six) hours as needed for moderate pain.   levothyroxine 125 MCG tablet Commonly known as:  SYNTHROID, LEVOTHROID Take 125 mcg by mouth daily before breakfast.   polyethylene glycol packet Commonly known as:  MIRALAX / GLYCOLAX Take 17 g by mouth daily as needed for mild constipation.       Review of Systems  Constitutional: Positive for activity change, appetite change, fatigue and unexpected weight change. Negative for chills and fever.       Since his salivary gland ca treatments  HENT: Positive for congestion, hearing loss and trouble swallowing. Negative for postnasal drip, sinus pressure, sore throat and tinnitus.   Respiratory: Positive for cough. Negative for apnea, choking, chest tightness, shortness of breath, wheezing and stridor.   Cardiovascular: Negative for chest pain, palpitations and leg swelling.  Gastrointestinal: Negative for abdominal distention, abdominal pain, constipation, diarrhea, nausea, rectal pain and vomiting.  Genitourinary: Positive for scrotal swelling. Negative for dysuria.        Hydrocele  Musculoskeletal: Positive for gait problem.       S/p hip fx and was using cane prior to that, degenerative arthritis of hips, pelvis, lower back  Skin: Positive for pallor.  Neurological: Positive for weakness. Negative for dizziness.  Hematological: Negative for adenopathy. Bruises/bleeds easily.  Psychiatric/Behavioral: Positive for sleep disturbance. Negative for behavioral problems and confusion. The patient is nervous/anxious.        Denies depression    Vitals:   02/16/16 1004  BP: (!) 108/55  Pulse: (!) 51  Resp: (!) 21  Temp: 97.6 F (36.4 C)  TempSrc: Oral  SpO2: 92%  Weight: 153 lb (69.4 kg)   Body mass index is 21.95 kg/m. Physical Exam  Constitutional: He is oriented to person, place, and time. No distress.  Frail white male seated in his recliner chair  HENT:  Nose: Nose normal.  Mouth/Throat: Oropharynx is clear and moist. No oropharyngeal exudate.  Partial amputation of ear; bell's palsy  Eyes: EOM are normal. Pupils are equal, round, and reactive to light.  Neck: Neck supple. No JVD present.  S/p surgery on right side  Cardiovascular: Regular rhythm and intact distal pulses.   Murmur heard. brady  Pulmonary/Chest: Effort normal. He has no wheezes. He has no rales.  Some chronic rhonchi  Abdominal: Soft. Bowel sounds are normal. He exhibits no distension and no mass. There is no tenderness. There is no rebound and no guarding.  Musculoskeletal: He exhibits tenderness.  Mild swelling of right hip region and ecchymoses posteriorly, but no drainage or erythema of wound area, dressing intact as per ortho  Lymphadenopathy:    He has no cervical adenopathy.  Neurological: He is alert and oriented to person, place, and time.  Skin: Skin is warm and dry.  Psychiatric: He has a normal mood and affect. His behavior is normal. Judgment and thought content normal.    Labs reviewed: Basic Metabolic Panel:  Recent  Labs  02/11/16 1605 02/13/16 1842  02/14/16 0443 02/15/16 0339  NA 135  --  135 136  K 4.2  --  4.1 4.5  CL 100*  --  101 101  CO2 28  --  28 29  GLUCOSE 100*  --  123* 95  BUN 15  --  12 15  CREATININE 0.90 0.66 0.76 0.80  CALCIUM 8.9  --  8.6* 8.7*   Liver Function Tests: No results for input(s): AST, ALT, ALKPHOS, BILITOT, PROT, ALBUMIN in the last 8760 hours. No results for input(s): LIPASE, AMYLASE in the last 8760 hours. No results for input(s): AMMONIA in the last 8760 hours. CBC:  Recent Labs  02/11/16 1605 02/13/16 1842 02/14/16 0443 02/15/16 0339  WBC 7.6 6.4 5.2 4.4  NEUTROABS 6.5  --   --   --   HGB 14.4 14.2 14.3 14.4  HCT 43.8 43.4 43.7 44.2  MCV 96.7 98.2 97.5 98.0  PLT 201 183 176 177   Cardiac Enzymes: No results for input(s): CKTOTAL, CKMB, CKMBINDEX, TROPONINI in the last 8760 hours. BNP: Invalid input(s): POCBNP Lab Results  Component Value Date   HGBA1C 5.5 03/06/2013   Lab Results  Component Value Date   TSH 6.504 (H) 02/14/2016   Lab Results  Component Value Date   VITAMINB12 415 03/06/2013   No results found for: FOLATE No results found for: IRON, TIBC, FERRITIN  Imaging and Procedures obtained prior to SNF admission: Dg Chest 1 View  Result Date: 02/11/2016 CLINICAL DATA:  Status post fall last night from standing position. Patient undergoing evaluation of the left hip fracture. Preoperative study. EXAM: CHEST 1 VIEW COMPARISON:  Chest x-ray of Oct 28, 2015 FINDINGS: The patient is rotated on both images. The lungs are adequately inflated and clear. There is no pleural effusion or pneumothorax. There is a prosthetic valve ring in the mitral position. The heart is normal in size. The pulmonary vascularity is not engorged. There is calcification in the wall of the aortic arch. The observed bony thorax exhibits no acute abnormality. There are degenerative changes of both shoulders. IMPRESSION: There is no pneumonia nor CHF nor other acute cardiopulmonary abnormality. There is a  prosthetic mitral valve. Aortic atherosclerosis. Electronically Signed   By: David  Martinique M.D.   On: 02/11/2016 16:52   Dg Pelvis 1-2 Views  Result Date: 02/11/2016 CLINICAL DATA:  Fall last night, left hip pain EXAM: PELVIS - 1-2 VIEW COMPARISON:  None. FINDINGS: Single frontal view of the pelvis submitted. Extensive degenerative changes are noted lower lumbar spine. There is shortening of left femoral neck with a vague lucent line highly suspicious for impacted fracture. Clinical correlation is necessary. Further correlation with CT scan or MRI is recommended. There is asymmetry of left superior and inferior pubic ramus. Although this may be positional impacted fracture cannot be excluded. Further correlation with CT scan is recommended. IMPRESSION: Extensive degenerative changes are noted lower lumbar spine. There is shortening of left femoral neck with a vague lucent line highly suspicious for impacted fracture. Clinical correlation is necessary. Further correlation with CT scan or MRI is recommended. There is asymmetry of left superior and inferior pubic ramus. Although this may be positional impacted fracture cannot be excluded. Further correlation with CT scan is recommended. Electronically Signed   By: Lahoma Crocker M.D.   On: 02/11/2016 16:55   Mr Hip Left Wo Contrast  Result Date: 02/11/2016 CLINICAL DATA:  Left femoral neck fracture. EXAM: MR OF THE LEFT HIP  WITHOUT CONTRAST TECHNIQUE: Multiplanar, multisequence MR imaging was performed. No intravenous contrast was administered. COMPARISON:  02/11/2016 FINDINGS: Bones: Nondisplaced left femoral neck fracture with associated marrow edema. Cortical discontinuity is more obvious laterally rather than medially. Lower lumbar spondylosis and degenerative disc disease. Articular cartilage and labrum Articular cartilage:  Mild chondral thinning in both hips. Labrum:  Grossly unremarkable Joint or bursal effusion Joint effusion:  Moderate left hip joint  effusion. Bursae:  Small amounts of fluid deep to both iliacus muscles. Muscles and tendons Muscles and tendons: Low-level edema in the soft tissues around the left hip joint. Iliopsoas tendons normal and symmetric. Other findings Miscellaneous: Sigmoid colon diverticulosis without active diverticulitis. Left scrotal hydrocele potentially up to the 130 cc. IMPRESSION: 1. Nondisplaced acute left femoral neck fracture with associated marrow edema and hip effusion. 2. Left scrotal hydrocele potentially up to 130 cc in volume. 3. Trace fluid deep to both iliacus muscles. 4. Mild chondral thinning in both hips. 5. Lower lumbar spondylosis and degenerative disc disease. 6. Sigmoid colon diverticulosis. Electronically Signed   By: Van Clines M.D.   On: 02/11/2016 23:45   Dg Femur Min 2 Views Left  Result Date: 02/11/2016 CLINICAL DATA:  Left hip pain after fall at nursing facility. EXAM: LEFT FEMUR 2 VIEWS COMPARISON:  None. FINDINGS: Almyra Brace is seen in left femoral neck concerning for nondisplaced fracture. MRI may be performed for further evaluation. Remaining portion of left femur appears normal. IMPRESSION: Linear lucency seen in left femoral neck concerning for nondisplaced fracture. MRI may be performed for further evaluation. Electronically Signed   By: Marijo Conception, M.D.   On: 02/11/2016 16:54    Assessment/Plan 1. Closed fracture of neck of left femur, initial encounter ([place]) -s/p pinning repair by Dr. Alvan Dame -doing well so far -is for PT later today -f/u Dr. Alvan Dame in 2 wks - to be using walker at all times -pain well controlled with rare single hydrocodone, mostly using his tylenol - would try to d/c hydrocodone prior to rehab d/c if pt not using or very rarely using it -ordered his home bowel regimen (dulcolax tabs) and if does not have bm with that, he agrees to a pr treatment  2. Anorexia -since salivary gland ca and treatments for it, ongoing  3. Malnutrition of moderate  degree -due to anorexia, loss of taste and dysphagia -continue eating what he likes and tastes good and encouraged supplements--has ensure orders and RD to see  4. MGUS (monoclonal gammopathy of unknown significance) -stable, cont to monitor his blood counts and spep/upep annually  5. Right-sided Bell's palsy -s/p his cancer treatments  6. Sinus bradycardia -chronic, stable, asymptomatic, pt does not want further workup  7. Hypothyroidism, unspecified hypothyroidism type -cont same synthroid; would recheck in 6 wks due to elevated tsh with nl free t4  8. Salivary gland carcinoma (HCC) -s/p resection and XRT and did have mets to lymph nodes at that time, developed Bell's palsy  9. Dysphagia, oropharyngeal phase -see hpi for details and MBS from hospitalization -unsure if pt will agree to ST here due to out of pocket cost  Family/ staff Communication: discussed with rehab nurse  Labs/tests ordered:  Would f/u cbc before rehab discharge due to postop anemia  Tekeshia Klahr L. Traci Gafford, D.O. [facility] [address], [place] [address] Cell Phone (Mon-Fri 8am-5pm):  [phone] On Call:  [phone] & follow prompts after 5pm & weekends Office Phone:  [phone] Office Fax:  [phone]    

## 2016-02-16 NOTE — Clinical Social Work Note (Signed)
Clinical Social Work Assessment  Patient Details  Name: BROCKTON MCKESSON MRN: [id number] Date of Birth: 1927-06-08  Date of referral:  02/15/16               Reason for consult:  Facility Placement (Patient is a resident at [facility])                Permission sought to share information with:  Facility Sport and exercise psychologist Permission granted to share information::   ([facility])  Name::        Agency::     Relationship::     Contact Information:     Housing/Transportation Living arrangements for the past 2 months:  [place] ([place]) Source of Information:  Patient Patient Interpreter Needed:  None Criminal Activity/Legal Involvement Pertinent to Current Situation/Hospitalization:  No - Comment as needed Significant Relationships:  Spouse Lives with:  Spouse Do you feel safe going back to the place where you live?  Yes Need for family participation in patient care:  Yes (Comment)  Care giving concerns:  Patient needs assistance with ADLs.   Social Worker assessment / plan:  SW met with patient at bedside. Patient is alert and oriented. Per note, pt presents to [facility] due to neck f/x. Patient states that he is accepting to get rehab if it is at [facility]. SW reached out to facility and made admissions aware. They state pt is welcomed to come today. Patient notified.   Employment status:  Retired Forensic scientist:    PT Recommendations:  [place] / Referral to community resources:  [place]  Patient/Family's Response to care:  Accepting.  Patient/Family's Understanding of and Emotional Response to Diagnosis, Current Treatment, and Prognosis:  No questions.  Emotional Assessment Appearance:  Appears stated age Attitude/Demeanor/Rapport:   (Appropriate.) Affect (typically observed):  Appropriate Orientation:  Oriented to Self, Oriented to Place, Oriented to  Time, Oriented to Situation Alcohol /  Substance use:  Not Applicable Psych involvement (Current and /or in the community):     Discharge Needs  Concerns to be addressed:  No discharge needs identified Readmission within the last 30 days:  No Current discharge risk:  None Barriers to Discharge:  No Barriers Identified   Bernita Buffy 02/16/2016, 2:20 PM

## 2016-02-16 NOTE — Clinical Social Work Placement (Signed)
   CLINICAL SOCIAL WORK PLACEMENT  NOTE  Date:  02/16/2016  Patient Details  Name: Adrian West MRN: [id number] Date of Birth: 1928/03/30  Clinical Social Work is seeking post-discharge placement for this patient at the [place] level of care (*CSW will initial, date and re-position this form in  chart as items are completed):  Yes   Patient/family provided with [facility] Work Department's list of facilities offering this level of care within the geographic area requested by the patient (or if unable, by the patient's family).  Yes   Patient/family informed of their freedom to choose among providers that offer the needed level of care, that participate in Medicare, Medicaid or managed care program needed by the patient, have an available bed and are willing to accept the patient.  Yes   Patient/family informed of [facility]'s ownership interest in [facility] and [facility], as well as of the fact that they are under no obligation to receive care at these facilities.  PASRR submitted to EDS on       PASRR number received on       Existing PASRR number confirmed on       FL2 transmitted to all facilities in geographic area requested by pt/family on       FL2 transmitted to all facilities within larger geographic area on       Patient informed that his/her managed care company has contracts with or will negotiate with certain facilities, including the following:        Yes   Patient/family informed of bed offers received.  Patient chooses bed at  (Patient is from [facility] and only wishes to go to [facility]!)     Physician recommends and patient chooses bed at      Patient to be transferred to  [facility]) on 02/16/16.  Patient to be transferred to facility by  Corey Harold)     Patient family notified on 02/16/16 of transfer.  Name of family member notified:   (Patient aware and alert.)     PHYSICIAN       Additional  Comment:    _______________________________________________ Bernita Buffy 02/16/2016, 2:23 PM

## 2016-02-17 ENCOUNTER — Encounter (HOSPITAL_COMMUNITY): Payer: Self-pay | Admitting: Orthopedic Surgery

## 2016-02-21 DIAGNOSIS — G510 Bell's palsy: Secondary | ICD-10-CM | POA: Insufficient documentation

## 2016-02-21 DIAGNOSIS — R001 Bradycardia, unspecified: Secondary | ICD-10-CM | POA: Insufficient documentation

## 2016-02-24 ENCOUNTER — Non-Acute Institutional Stay (SKILLED_NURSING_FACILITY): Payer: Medicare Other | Admitting: Internal Medicine

## 2016-02-24 DIAGNOSIS — T17908A Unspecified foreign body in respiratory tract, part unspecified causing other injury, initial encounter: Secondary | ICD-10-CM | POA: Diagnosis not present

## 2016-02-24 DIAGNOSIS — R1312 Dysphagia, oropharyngeal phase: Secondary | ICD-10-CM

## 2016-02-24 DIAGNOSIS — R1310 Dysphagia, unspecified: Secondary | ICD-10-CM | POA: Diagnosis not present

## 2016-02-24 NOTE — Progress Notes (Signed)
Location:  [facility] Room Number: [id number] Place of Service:  SNF (31) Provider:  Airyana Sprunger L. Mariea Clonts, D.O., C.M.D. DOS:  02/23/16  [name], MD  Patient Care Team: [name], MD as PCP - General (Internal Medicine) [name], MD as Attending Physician (Internal Medicine) [name], MD as Consulting Physician (Otolaryngology) [name], MD as Consulting Physician (Ophthalmology) [name], MD as Consulting Physician (Cardiology) [name], DO as Consulting Physician (Neurology) [name], MD as Referring Physician (Otolaryngology) [name], MD as Attending Physician (Radiation Oncology) [name], RN as Oncology Nurse Navigator Brand Males, MD as Consulting Physician (Pulmonary Disease) [name], MD as Referring Physician (Otolaryngology) [name], MD as Consulting Physician (Dermatology)  Extended Emergency Contact Information Primary Emergency Contact: Abe People of [place] Phone: [phone] Mobile Phone: [phone] Relation: Daughter Secondary Emergency Contact: Shontz,[name] Address: [place], [place] of [place] Phone: [phone] Relation: Spouse  Code Status: DNR Goals of care: Advanced Directive information Advanced Directives 02/16/2016  Does patient have an advance directive? Yes  Type of Paramedic of [place];Living will  Does patient want to make changes to advanced directive? -  Copy of advanced directive(s) in chart? Yes  Would patient like information on creating an advanced directive? -   Chief Complaint  Patient presents with  . Acute Visit    HPI:  Pt is a 82 y.o. male seen today for an acute visit for two choking spells, one on Sunday and another this am.  He reports he was eating breakfast this am and it was going well, when suddenly he could not swallow.  He  was having odynophagia and dysphagia.  He is on a soft diet at present.  He is paying for his PT out of pocket, but says he cannot afford to pay for ST also unless a gap exception can be made.    Apparently, he never got any of the xopenex I ordered for him last week b/c his nebulizer was at home and he had to request the nebs and didn't.  His breath sounds were coarse and wet this am.  He felt poorly, but was beginning to feel better by early afternoon when I saw him.     Past Medical History:  Diagnosis Date  . Anorexia 08/16/2014  . Bell's palsy   . Benign neoplasm of colon   . 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 Deaf   . Diaphragm paralysis   . Diverticulosis   . DNR (do not resuscitate) discussion 07/22/2014  . Dyspnea and respiratory abnormality 03/10/2014  . 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 Interstitial lung disease 10/15/13 Xray [facility]) 01/02/12 new subsegmental ATX on L (vs 09/03/11) Seeing Dr Theda Sers [facility]  Seen by Dr Corrin Parker, [facility],  02/2011. Component of thoracic height loss & alcohol related neuropathy questioned. No plication recommended  5/4- 10/14/2013 @ [facility] ; Dr  Ellis Parents.10/15/13 new focal consolidation mid anterior L lung:aspiration vs PNA . Underlying interstitial lung disease.10/15/13 Rx sent to [facility] for Doxycycline & Augmentin by Dr Jeannine Kitten ( not picked up as of 10/18/13).  Flew back to [facility]; seen @ 10/16/13 [facility], [place]. Levaquin X  5 days , Dr Evalee Mutton    . Esophageal stricture   . Gait disturbance   . GERD 02/14/2008   Qualifier: Diagnosis of  By: [name] MD, [name]    . Hiatal hernia   . History of radiation therapy 06/11/14-07/22/14   skin cancer of scalp/face  . HYPERTENSION 02/14/2008       . HYPOTHYROIDISM 02/14/2008  .  Interstitial lung disease ([place]) 05/05/2014  . Loss of weight 07/22/2014  . Lung abnormality 11/25/2008   "nerve damage from heart surgery"  . Memory loss 12/31/2009   Qualifier: Diagnosis of  By: [name] MD, [name]    . MGUS (monoclonal gammopathy of unknown significance) 04/11/2013  . MITRAL REGURGITATION 02/14/2008   Qualifier: Diagnosis of  By: [name] MD, Cherlynn Kaiser MV surgery 11/2008, [name].complcated by paralysis of diaphragm     . 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 Neuropathy, peripheral ([place])   . Other and unspecified hyperlipidemia 02/24/2012  . Polycythemia, secondary 01/10/2014  . PVC's (premature ventricular contractions) 04/05/2011  . Radiation 06/11/14- 07/22/14   Right parotid bed, posterior auricular, right upper neck.  . 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 Skin cancer of scalp or skin of neck 05/21/2014  . SPINAL STENOSIS, LUMBAR 07/28/2010   Qualifier: Diagnosis of  By: [name] MD, [name], NS ( retired)    . Squamous carcinoma ([place])   . TIA (transient ischemic attack) May 2012  . Tremor 2015  . Tremor, essential   . Vertigo 2005  . Vertigo 2005  . Weakness generalized 07/22/2014   Past Surgical History:  Procedure Laterality Date  . APPENDECTOMY    . BROW LIFT Right 04/25/2014   Midforehead  . CARDIAC CATHETERIZATION    . CARDIAC VALVE REPLACEMENT    . CATARACT EXTRACTION     [name]  . CHOLECYSTECTOMY  1998   [name]  . ECTROPION REPAIR Right 04/25/2014   Right tarsal strip  . ESOPHAGEAL DILATION      X3;[name]  . HIP PINNING,CANNULATED Left 02/13/2016   Procedure: CANNULATED LEFT HIP PINNING;  Surgeon: [name], MD;  Location: [facility];  Service: Orthopedics;  Laterality: Left;  . INGUINAL HERNIA REPAIR    . LAPAROSCOPIC ABLATION  RENAL MASS  08/14/2009  . LUMBAR LAMINECTOMY    . 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 MOHS SURGERY  2012    ear  . NASAL SINUS SURGERY    . Nasolabial repair Right 04/25/2014   Right static sling using AlloDerm graft elevation of the nasolabial fold  . NECK DISSECTION Right 04/25/2014  . PAROTIDECTOMY Right 04/25/2014   Sacrifice of the facial nerve  . Partial auriculectomy Right 04/25/2014  . RIGHT HEART CATHETERIZATION N/A 03/03/2014   Procedure: RIGHT HEART CATH;  Surgeon: [name], MD;  Location: [facility] C[facility] LAB;  Service: Cardiovascular;  Laterality: N/A;  . TILT TABLE STUDY N/A 05/06/2013   Procedure: TILT TABLE STUDY;  Surgeon: [name], MD;  Location: [facility] C[facility] LAB;  Service: Cardiovascular;  Laterality: N/A;  . TONSILLECTOMY      Allergies  Allergen Reactions  . Plavix [Clopidogrel] Other (See Comments)    Cannot take med  Medication List       Accurate as of 02/24/16  8:29 AM. Always use your most recent med list.          acetaminophen 325 MG tablet Commonly known as:  TYLENOL Take 2 tablets (650 mg total) by mouth every 6 (six) hours as needed for mild pain (or Fever >/= 101).   docusate sodium 100 MG capsule Commonly known as:  COLACE Take 1 capsule (100 mg total) by mouth 2 (two) times daily.   enoxaparin 40 MG/0.4ML injection Commonly known as:  LOVENOX Inject 0.4 mLs (40 mg total) into the skin daily.   feeding supplement (ENSURE ENLIVE) Liqd Take 237 mLs by mouth 2 (two) times daily between meals.   ferrous sulfate 325 (65 FE) MG tablet Take 1 tablet (325 mg total) by mouth 2 (two) times daily with a meal. For 2 weeks   HYDROcodone-acetaminophen 5-325 MG tablet Commonly known as:  NORCO/VICODIN Take 1-2 tablets by mouth every 6 (six) hours as needed for moderate pain.   levothyroxine 125 MCG tablet Commonly known as:  SYNTHROID, LEVOTHROID Take 125 mcg by mouth daily  before breakfast.   polyethylene glycol packet Commonly known as:  MIRALAX / GLYCOLAX Take 17 g by mouth daily as needed for mild constipation.       Review of Systems  Constitutional: Positive for malaise/fatigue and weight loss. Negative for chills and fever.  HENT: Positive for hearing loss.   Eyes:       Glasses  Respiratory: Positive for cough, sputum production and shortness of breath. Negative for hemoptysis and wheezing.   Cardiovascular: Negative for chest pain, palpitations and leg swelling.  Gastrointestinal: Negative for abdominal pain.  Genitourinary: Negative for dysuria.  Musculoskeletal: Positive for falls and joint pain.  Skin: Negative for rash.  Neurological: Positive for weakness. Negative for dizziness.       Bells palsy  Endo/Heme/Allergies: Bruises/bleeds easily.  Psychiatric/Behavioral: Negative for memory loss.    Immunization History  Administered Date(s) Administered  . Influenza Split 03/07/2011, 02/24/2012  . Influenza Whole 03/09/2009, 02/11/2010  . Influenza,inj,Quad PF,36+ Mos 02/11/2013, 02/13/2014  . Pneumococcal Polysaccharide-23 05/11/2004, 02/11/2010  . Pneumococcal-Unspecified 04/06/2013  . Td 08/17/2005   Pertinent  Health Maintenance Due  Topic Date Due  . PNA vac Low Risk Adult (2 of 2 - PCV13) 04/06/2014  . INFLUENZA VACCINE  01/05/2016   Fall Risk  08/14/2015 08/18/2014 05/21/2014  Falls in the past year? No No No  Risk for fall due to : - - Impaired balance/gait;Impaired mobility    There were no vitals filed for this visit. There is no height or weight on file to calculate BMI. Physical Exam  Constitutional: He is oriented to person, place, and time.  Frail white male resting in bed  Cardiovascular: Normal rate, regular rhythm, normal heart sounds and intact distal pulses.   Pulmonary/Chest: Effort normal and breath sounds normal.  No rhonchi or wheezes  Abdominal: Soft. Bowel sounds are normal.  Musculoskeletal: Normal  range of motion.  Hip tenderness improving  Neurological: He is alert and oriented to person, place, and time.  Skin: Skin is warm and dry.  Psychiatric: He has a normal mood and affect.    Labs reviewed:  Recent Labs  02/11/16 1605 02/13/16 1842 02/14/16 0443 02/15/16 0339  NA 135  --  135 136  K 4.2  --  4.1 4.5  CL 100*  --  101 101  CO2 28  --  28 29  GLUCOSE 100*  --  123* 95  BUN 15  --  12 15  CREATININE 0.90 0.66 0.76 0.80  CALCIUM 8.9  --  8.6* 8.7*   No results for input(s): AST, ALT, ALKPHOS, BILITOT, PROT, ALBUMIN in the last 8760 hours.  Recent Labs  02/11/16 1605 02/13/16 1842 02/14/16 0443 02/15/16 0339  WBC 7.6 6.4 5.2 4.4  NEUTROABS 6.5  --   --   --   HGB 14.4 14.2 14.3 14.4  HCT 43.8 43.4 43.7 44.2  MCV 96.7 98.2 97.5 98.0  PLT 201 183 176 177   Lab Results  Component Value Date   TSH 6.504 (H) 02/14/2016   Lab Results  Component Value Date   HGBA1C 5.5 03/06/2013   Lab Results  Component Value Date   CHOL 166 10/22/2013   HDL 37.80 (L) 10/22/2013   LDLCALC 98 10/22/2013   LDLDIRECT 145.2 09/20/2010   TRIG 152.0 (H) 10/22/2013   CHOLHDL 4 10/22/2013    Significant Diagnostic Results in last 30 days:  Dg Chest 1 View  Result Date: 02/11/2016 CLINICAL DATA:  Status post fall last night from standing position. Patient undergoing evaluation of the left hip fracture. Preoperative study. EXAM: CHEST 1 VIEW COMPARISON:  Chest x-ray of Oct 28, 2015 FINDINGS: The patient is rotated on both images. The lungs are adequately inflated and clear. There is no pleural effusion or pneumothorax. There is a prosthetic valve ring in the mitral position. The heart is normal in size. The pulmonary vascularity is not engorged. There is calcification in the wall of the aortic arch. The observed bony thorax exhibits no acute abnormality. There are degenerative changes of both shoulders. IMPRESSION: There is no pneumonia nor CHF nor other acute cardiopulmonary  abnormality. There is a prosthetic mitral valve. Aortic atherosclerosis. Electronically Signed   By: David  Martinique M.D.   On: 02/11/2016 16:52   Dg Pelvis 1-2 Views  Result Date: 02/11/2016 CLINICAL DATA:  Fall last night, left hip pain EXAM: PELVIS - 1-2 VIEW COMPARISON:  None. FINDINGS: Single frontal view of the pelvis submitted. Extensive degenerative changes are noted lower lumbar spine. There is shortening of left femoral neck with a vague lucent line highly suspicious for impacted fracture. Clinical correlation is necessary. Further correlation with CT scan or MRI is recommended. There is asymmetry of left superior and inferior pubic ramus. Although this may be positional impacted fracture cannot be excluded. Further correlation with CT scan is recommended. IMPRESSION: Extensive degenerative changes are noted lower lumbar spine. There is shortening of left femoral neck with a vague lucent line highly suspicious for impacted fracture. Clinical correlation is necessary. Further correlation with CT scan or MRI is recommended. There is asymmetry of left superior and inferior pubic ramus. Although this may be positional impacted fracture cannot be excluded. Further correlation with CT scan is recommended. Electronically Signed   By: Lahoma Crocker M.D.   On: 02/11/2016 16:55   Mr Hip Left Wo Contrast  Result Date: 02/11/2016 CLINICAL DATA:  Left femoral neck fracture. EXAM: MR OF THE LEFT HIP WITHOUT CONTRAST TECHNIQUE: Multiplanar, multisequence MR imaging was performed. No intravenous contrast was administered. COMPARISON:  02/11/2016 FINDINGS: Bones: Nondisplaced left femoral neck fracture with associated marrow edema. Cortical discontinuity is more obvious laterally rather than medially. Lower lumbar spondylosis and degenerative disc disease. Articular cartilage and labrum Articular cartilage:  Mild chondral thinning in both hips. Labrum:  Grossly unremarkable Joint or bursal effusion Joint effusion:   Moderate left hip joint  effusion. Bursae:  Small amounts of fluid deep to both iliacus muscles. Muscles and tendons Muscles and tendons: Low-level edema in the soft tissues around the left hip joint. Iliopsoas tendons normal and symmetric. Other findings Miscellaneous: Sigmoid colon diverticulosis without active diverticulitis. Left scrotal hydrocele potentially up to the 130 cc. IMPRESSION: 1. Nondisplaced acute left femoral neck fracture with associated marrow edema and hip effusion. 2. Left scrotal hydrocele potentially up to 130 cc in volume. 3. Trace fluid deep to both iliacus muscles. 4. Mild chondral thinning in both hips. 5. Lower lumbar spondylosis and degenerative disc disease. 6. Sigmoid colon diverticulosis. Electronically Signed   By: Van Clines M.D.   On: 02/11/2016 23:45   Dg C-arm 1-60 Min  Result Date: 02/13/2016 CLINICAL DATA:  Left hip pinning. EXAM: OPERATIVE left HIP (WITH PELVIS IF PERFORMED) 2 VIEWS TECHNIQUE: Fluoroscopic spot image(s) were submitted for interpretation post-operatively. FLUOROSCOPY TIME:  1 minutes 6 seconds. COMPARISON:  Radiographs of April 13, 2015. FINDINGS: These images demonstrate internal pinning of proximal left femoral neck fracture. Good alignment of fracture components is noted. IMPRESSION: Status post surgical internal fixation of proximal left femoral neck fracture. Electronically Signed   By: Marijo Conception, M.D.   On: 02/13/2016 13:51   Dg Hip Operative Unilat W Or W/o Pelvis Left  Result Date: 02/13/2016 CLINICAL DATA:  Left hip pinning. EXAM: OPERATIVE left HIP (WITH PELVIS IF PERFORMED) 2 VIEWS TECHNIQUE: Fluoroscopic spot image(s) were submitted for interpretation post-operatively. FLUOROSCOPY TIME:  1 minutes 6 seconds. COMPARISON:  Radiographs of April 13, 2015. FINDINGS: These images demonstrate internal pinning of proximal left femoral neck fracture. Good alignment of fracture components is noted. IMPRESSION: Status post surgical  internal fixation of proximal left femoral neck fracture. Electronically Signed   By: Marijo Conception, M.D.   On: 02/13/2016 13:51   Dg Femur Min 2 Views Left  Result Date: 02/11/2016 CLINICAL DATA:  Left hip pain after fall at nursing facility. EXAM: LEFT FEMUR 2 VIEWS COMPARISON:  None. FINDINGS: Almyra Brace is seen in left femoral neck concerning for nondisplaced fracture. MRI may be performed for further evaluation. Remaining portion of left femur appears normal. IMPRESSION: Linear lucency seen in left femoral neck concerning for nondisplaced fracture. MRI may be performed for further evaluation. Electronically Signed   By: Marijo Conception, M.D.   On: 02/11/2016 16:54    Assessment/Plan 1. Dysphagia -had MBS during hospitalization as per my H&P -needs ST, but refusing unless he can get it partially covered at least by his insurance -wants to continue soft food diet at this point--does not eat beef or chicken -mostly eats his breakfast meal and then 2 boost at lunch and 2 at dinner -did not want his diet downgraded further--dislikes the thickened beverages  2. Odynophagia -ongoing pain in his throat since his episode on Sunday of food sticking in his throat  3. Aspiration into respiratory tract, initial encounter -seems that his congestion and dypsnea he had just after this morning's episode have improved with sitting up and use of IS -asked staff to get his nebulizer machine from home and use the xopenex ordered last week  -he can't get his oxygen at hs from home due to financial reasons also--at home he uses it at night  Family/ staff Communication: discussed with his wife and rehab nurse  Labs/tests ordered:  Needs ST if gap exception can be obtained.    Shonte Soderlund L. Oluwademilade Mckiver, D.O. [facility] [phone]  [address], [place] [address] Cell Phone (Mon-Fri 8am-5pm):  [phone] On Call:  [phone] & follow prompts after 5pm & weekends Office  Phone:  [phone] Office Fax:  [phone]

## 2016-02-25 ENCOUNTER — Encounter: Payer: Self-pay | Admitting: Adult Health

## 2016-02-25 ENCOUNTER — Ambulatory Visit: Payer: Self-pay | Admitting: Internal Medicine

## 2016-02-25 ENCOUNTER — Telehealth: Payer: Self-pay | Admitting: Internal Medicine

## 2016-02-25 ENCOUNTER — Non-Acute Institutional Stay (SKILLED_NURSING_FACILITY): Payer: Medicare Other | Admitting: Adult Health

## 2016-02-25 DIAGNOSIS — R001 Bradycardia, unspecified: Secondary | ICD-10-CM | POA: Diagnosis not present

## 2016-02-25 DIAGNOSIS — R1312 Dysphagia, oropharyngeal phase: Secondary | ICD-10-CM

## 2016-02-25 DIAGNOSIS — R630 Anorexia: Secondary | ICD-10-CM | POA: Diagnosis not present

## 2016-02-25 NOTE — Progress Notes (Signed)
Patient ID: Adrian West, male   DOB: [birth year], 82 y.o.   MRN: [id number]  Location:    [facility]   Place of Service:   rehab Provider:   Cindi Carbon, [place] [phone]  [name], MD  Patient Care Team: [name], MD as PCP - General (Internal Medicine) [name], MD as Attending Physician (Internal Medicine) [name], MD as Consulting Physician (Otolaryngology) [name], MD as Consulting Physician (Ophthalmology) [name], MD as Consulting Physician (Cardiology) [name], DO as Consulting Physician (Neurology) [name], MD as Referring Physician (Otolaryngology) [name], MD as Attending Physician (Radiation Oncology) [name], RN as Oncology Nurse Navigator Brand Males, MD as Consulting Physician (Pulmonary Disease) [name], MD as Referring Physician (Otolaryngology) [name], MD as Consulting Physician (Dermatology)  Extended Emergency Contact Information Primary Emergency Contact: Abe People of [place] Phone: [phone] Mobile Phone: [phone] Relation: Daughter Secondary Emergency Contact: Byers,[name] Address: [place], [place] Phone: [phone] Relation: Spouse  Code Status:  DNR Goals of care: Advanced Directive information Advanced Directives 02/16/2016  Does patient have an advance directive? Yes  Type of Paramedic of [place];Living will  Does patient want to make changes to advanced directive? -  Copy of advanced directive(s) in chart? Yes  Would patient like information on creating an advanced directive? -     Chief Complaint  Patient presents with  . Acute Visit    appetite stimuant?,  dysphagia, low HR    HPI:  Pt is a 82 y.o. male seen today for an acute visit for dysphagia. 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  He is currently in rehab for therapy after a fall with subsequent hip fx. He also reports receiving esophageal dilation for stricture.  He is requesting to have this done again.  He had trouble getting an apt with [name] and now has one with his PA for 03/07/16.  Several days ago he had a choking episode and felt that food would not pass which warranted the apt.  He can swallow now and is mostly drinking ensure. He does not have an appetite and has no taste sensation.   Also his HR has been running low here 39-50.  He is asymptomatic generally, but has a hx of vertigo that comes and goes. BP has stable at 120/58 today, occasional reading have been in the high 80's and low 90's.  NO syncope, sob, cp, palpitations, or orthostasis noted.  Has a chronic cough with sputum production and chronic sinus issues. Uses a xopenex and oxygen at night. No fever.  During his recent hospitalization it was recommended that he have a D3 diet with NTL due to aspiration found on MBS.  He has refused diet changes. Currently ordered regular food but since the choking episode he has been drinking mostly supplements.   Past Medical History:  Diagnosis Date  . Anorexia 08/16/2014  . Bell's palsy   . Benign neoplasm of colon   . 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 Deaf   . Diaphragm paralysis   . Diverticulosis   . DNR (do not resuscitate) discussion 07/22/2014  . Dyspnea and respiratory abnormality 03/10/2014  . 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 Interstitial lung disease 10/15/13 Xray [facility]) 01/02/12 new subsegmental ATX on L (vs 09/03/11) Seeing Dr Theda Sers [facility]  Se[facility] by Dr Corrin Parker, [facility],  02/2011. Component of thoracic height loss & alcohol related neuropathy  questioned. No plication recommended  5/4- 10/14/2013 @ [facility] ; Dr  Ellis Parents.10/15/13 new focal consolidation mid anterior L lung:aspiration vs PNA . Underlying interstitial lung disease.10/15/13 Rx sent to [facility] for Doxycycline & Augmentin by Dr Jeannine Kitten ( not picked up as of 10/18/13).  Flew back to [facility]; seen @ 10/16/13 [facility], [place]. Levaquin X 5 days , Dr Evalee Mutton    . Esophageal stricture   . Gait disturbance   . GERD 02/14/2008   Qualifier: Diagnosis of  By: [name] MD, [name]    . Hiatal hernia   . History of radiation therapy 06/11/14-07/22/14   skin cancer of scalp/face  . HYPERTENSION 02/14/2008       . HYPOTHYROIDISM 02/14/2008  . Interstitial lung disease ([place]) 05/05/2014  . Loss of weight 07/22/2014  . Lung abnormality 11/25/2008   "nerve damage from heart surgery"  . Memory loss 12/31/2009   Qualifier: Diagnosis of  By: [name] MD, [name]    . MGUS (monoclonal gammopathy of unknown significance) 04/11/2013  . MITRAL REGURGITATION 02/14/2008   Qualifier: Diagnosis of  By: [name] MD, Cherlynn Kaiser MV surgery 11/2008, [name].complcated by paralysis of diaphragm     . 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 Neuropathy, peripheral ([place])   . Other and unspecified hyperlipidemia 02/24/2012  . Polycythemia, secondary 01/10/2014  . PVC's (premature ventricular contractions) 04/05/2011  . Radiation 06/11/14- 07/22/14   Right parotid bed, posterior auricular, right upper neck.  . Salivary gland malignant neoplasm ([place]) 03/10/2014  . 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 Skin cancer of scalp or skin of neck 05/21/2014  . SPINAL STENOSIS, LUMBAR 07/28/2010   Qualifier: Diagnosis of  By: [name] MD, [name], NS ( retired)    . Squamous carcinoma ([place])   . TIA (transient ischemic attack) May 2012  . Tremor 2015    . Tremor, essential   . Vertigo 2005  . Vertigo 2005  . Weakness generalized 07/22/2014   Past Surgical History:  Procedure Laterality Date  . APPENDECTOMY    . BROW LIFT Right 04/25/2014   Midforehead  . CARDIAC CATHETERIZATION    . CARDIAC VALVE REPLACEMENT    . CATARACT EXTRACTION     [name]  . CHOLECYSTECTOMY  1998   [name]  . ECTROPION REPAIR Right 04/25/2014   Right tarsal strip  . ESOPHAGEAL DILATION      X3;[name]  . HIP PINNING,CANNULATED Left 02/13/2016   Procedure: CANNULATED LEFT HIP PINNING;  Surgeon: [name], MD;  Location: [facility];  Service: Orthopedics;  Laterality: Left;  . INGUINAL HERNIA REPAIR    . LAPAROSCOPIC ABLATION RENAL MASS  08/14/2009  . LUMBAR LAMINECTOMY    . 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 MOHS SURGERY  2012    ear  . NASAL SINUS SURGERY    . Nasolabial repair Right 04/25/2014   Right static sling  using AlloDerm graft elevation of the nasolabial fold  . NECK DISSECTION Right 04/25/2014  . PAROTIDECTOMY Right 04/25/2014   Sacrifice of the facial nerve  . Partial auriculectomy Right 04/25/2014  . RIGHT HEART CATHETERIZATION N/A 03/03/2014   Procedure: RIGHT HEART CATH;  Surgeon: [name], MD;  Location: [facility] CATH LAB;  Service: Cardiovascular;  Laterality: N/A;  . TILT TABLE STUDY N/A 05/06/2013   Procedure: TILT TABLE STUDY;  Surgeon: [name], MD;  Location: [facility] CATH LAB;  Service: Cardiovascular;  Laterality: N/A;  . TONSILLECTOMY      Allergies  Allergen Reactions  . Plavix [Clopidogrel] Other (See Comments)    Cannot take med      Medication List       Accurate as of 02/25/16 11:10 AM. Always use your most recent med list.          acetaminophen 325 MG tablet Commonly known as:  TYLENOL Take 2 tablets (650 mg total) by mouth every 6 (six) hours as needed for mild pain (or Fever >/= 101).   docusate sodium 100 MG capsule Commonly  known as:  COLACE Take 1 capsule (100 mg total) by mouth 2 (two) times daily.   enoxaparin 40 MG/0.4ML injection Commonly known as:  LOVENOX Inject 0.4 mLs (40 mg total) into the skin daily.   feeding supplement (ENSURE ENLIVE) Liqd Take 237 mLs by mouth 2 (two) times daily between meals.   ferrous sulfate 325 (65 FE) MG tablet Take 1 tablet (325 mg total) by mouth 2 (two) times daily with a meal. For 2 weeks   HYDROcodone-acetaminophen 5-325 MG tablet Commonly known as:  NORCO/VICODIN Take 1-2 tablets by mouth every 6 (six) hours as needed for moderate pain.   levothyroxine 125 MCG tablet Commonly known as:  SYNTHROID, LEVOTHROID Take 125 mcg by mouth daily before breakfast.   polyethylene glycol packet Commonly known as:  MIRALAX / GLYCOLAX Take 17 g by mouth daily as needed for mild constipation.       Review of Systems  Constitutional: Positive for appetite change and unexpected weight change. Negative for activity change, chills, diaphoresis, fatigue and fever.  HENT: Positive for congestion.   Respiratory: Positive for cough. Negative for shortness of breath, wheezing and stridor.   Cardiovascular: Positive for leg swelling. Negative for chest pain and palpitations.  Gastrointestinal: Negative for abdominal distention, abdominal pain, constipation and diarrhea.  Genitourinary: Negative for difficulty urinating and dysuria.  Musculoskeletal: Positive for arthralgias and gait problem. Negative for back pain, joint swelling and myalgias.  Neurological: Positive for speech difficulty. Negative for dizziness, seizures, syncope, facial asymmetry and headaches.       Bells palsy, vertigo  Hematological: Negative for adenopathy. Does not bruise/bleed easily.  Psychiatric/Behavioral: Negative for agitation, behavioral problems and confusion.    Immunization History  Administered Date(s) Administered  . Influenza Split 03/07/2011, 02/24/2012  . Influenza Whole 03/09/2009,  02/11/2010  . Influenza,inj,Quad PF,36+ Mos 02/11/2013, 02/13/2014  . Pneumococcal Polysaccharide-23 05/11/2004, 02/11/2010  . Pneumococcal-Unspecified 04/06/2013  . Td 08/17/2005   Pertinent  Health Maintenance Due  Topic Date Due  . PNA vac Low Risk Adult (2 of 2 - PCV13) 04/06/2014  . INFLUENZA VACCINE  01/05/2016   Fall Risk  08/14/2015 08/18/2014 05/21/2014  Falls in the past year? No No No  Risk for fall due to : - - Impaired balance/gait;Impaired mobility   Functional Status Survey:    Vitals:   02/25/16 1024  BP: (!) (P) 120/58  Pulse: (!) (  P) 48  Resp: (P) 20  Temp: (P) 97.9 F (36.6 C)  SpO2: (P) 99%  Weight: (P) 151 lb 9.6 oz (68.8 kg)   Body mass index is 21.75 kg/m (pended). Physical Exam  Constitutional: He is oriented to person, place, and time. No distress.  HENT:  Head: Normocephalic and atraumatic.  Neck: Normal range of motion. Neck supple. No JVD present. No tracheal deviation present. No thyromegaly present.  Cardiovascular: Regular rhythm.   No murmur heard. Slow rate  Pulmonary/Chest: Effort normal. No respiratory distress. He has wheezes (LUL).  Abdominal: Soft. Bowel sounds are normal. He exhibits no distension. There is no tenderness.  Lymphadenopathy:    He has no cervical adenopathy.  Neurological: He is alert and oriented to person, place, and time. No cranial nerve deficit.  Right sided facial paralysis  Skin: Skin is warm and dry. He is not diaphoretic.  Psychiatric: He has a normal mood and affect.    Labs reviewed:  Recent Labs  02/11/16 1605 02/13/16 1842 02/14/16 0443 02/15/16 0339  NA 135  --  135 136  K 4.2  --  4.1 4.5  CL 100*  --  101 101  CO2 28  --  28 29  GLUCOSE 100*  --  123* 95  BUN 15  --  12 15  CREATININE 0.90 0.66 0.76 0.80  CALCIUM 8.9  --  8.6* 8.7*   No results for input(s): AST, ALT, ALKPHOS, BILITOT, PROT, ALBUMIN in the last 8760 hours.  Recent Labs  02/11/16 1605 02/13/16 1842 02/14/16 0443  02/15/16 0339  WBC 7.6 6.4 5.2 4.4  NEUTROABS 6.5  --   --   --   HGB 14.4 14.2 14.3 14.4  HCT 43.8 43.4 43.7 44.2  MCV 96.7 98.2 97.5 98.0  PLT 201 183 176 177   Lab Results  Component Value Date   TSH 6.504 (H) 02/14/2016   Lab Results  Component Value Date   HGBA1C 5.5 03/06/2013   Lab Results  Component Value Date   CHOL 166 10/22/2013   HDL 37.80 (L) 10/22/2013   LDLCALC 98 10/22/2013   LDLDIRECT 145.2 09/20/2010   TRIG 152.0 (H) 10/22/2013   CHOLHDL 4 10/22/2013    Significant Diagnostic Results in last 30 days:  Dg Chest 1 View  Result Date: 02/11/2016 CLINICAL DATA:  Status post fall last night from standing position. Patient undergoing evaluation of the left hip fracture. Preoperative study. EXAM: CHEST 1 VIEW COMPARISON:  Chest x-ray of Oct 28, 2015 FINDINGS: The patient is rotated on both images. The lungs are adequately inflated and clear. There is no pleural effusion or pneumothorax. There is a prosthetic valve ring in the mitral position. The heart is normal in size. The pulmonary vascularity is not engorged. There is calcification in the wall of the aortic arch. The observed bony thorax exhibits no acute abnormality. There are degenerative changes of both shoulders. IMPRESSION: There is no pneumonia nor CHF nor other acute cardiopulmonary abnormality. There is a prosthetic mitral valve. Aortic atherosclerosis. Electronically Signed   By: David  Martinique M.D.   On: 02/11/2016 16:52   Dg Pelvis 1-2 Views  Result Date: 02/11/2016 CLINICAL DATA:  Fall last night, left hip pain EXAM: PELVIS - 1-2 VIEW COMPARISON:  None. FINDINGS: Single frontal view of the pelvis submitted. Extensive degenerative changes are noted lower lumbar spine. There is shortening of left femoral neck with a vague lucent line highly suspicious for impacted fracture. Clinical correlation is necessary. Further  correlation with CT scan or MRI is recommended. There is asymmetry of left superior and inferior  pubic ramus. Although this may be positional impacted fracture cannot be excluded. Further correlation with CT scan is recommended. IMPRESSION: Extensive degenerative changes are noted lower lumbar spine. There is shortening of left femoral neck with a vague lucent line highly suspicious for impacted fracture. Clinical correlation is necessary. Further correlation with CT scan or MRI is recommended. There is asymmetry of left superior and inferior pubic ramus. Although this may be positional impacted fracture cannot be excluded. Further correlation with CT scan is recommended. Electronically Signed   By: Lahoma Crocker M.D.   On: 02/11/2016 16:55   Mr Hip Left Wo Contrast  Result Date: 02/11/2016 CLINICAL DATA:  Left femoral neck fracture. EXAM: MR OF THE LEFT HIP WITHOUT CONTRAST TECHNIQUE: Multiplanar, multisequence MR imaging was performed. No intravenous contrast was administered. COMPARISON:  02/11/2016 FINDINGS: Bones: Nondisplaced left femoral neck fracture with associated marrow edema. Cortical discontinuity is more obvious laterally rather than medially. Lower lumbar spondylosis and degenerative disc disease. Articular cartilage and labrum Articular cartilage:  Mild chondral thinning in both hips. Labrum:  Grossly unremarkable Joint or bursal effusion Joint effusion:  Moderate left hip joint effusion. Bursae:  Small amounts of fluid deep to both iliacus muscles. Muscles and tendons Muscles and tendons: Low-level edema in the soft tissues around the left hip joint. Iliopsoas tendons normal and symmetric. Other findings Miscellaneous: Sigmoid colon diverticulosis without active diverticulitis. Left scrotal hydrocele potentially up to the 130 cc. IMPRESSION: 1. Nondisplaced acute left femoral neck fracture with associated marrow edema and hip effusion. 2. Left scrotal hydrocele potentially up to 130 cc in volume. 3. Trace fluid deep to both iliacus muscles. 4. Mild chondral thinning in both hips. 5. Lower  lumbar spondylosis and degenerative disc disease. 6. Sigmoid colon diverticulosis. Electronically Signed   By: Van Clines M.D.   On: 02/11/2016 23:45   Dg C-arm 1-60 Min  Result Date: 02/13/2016 CLINICAL DATA:  Left hip pinning. EXAM: OPERATIVE left HIP (WITH PELVIS IF PERFORMED) 2 VIEWS TECHNIQUE: Fluoroscopic spot image(s) were submitted for interpretation post-operatively. FLUOROSCOPY TIME:  1 minutes 6 seconds. COMPARISON:  Radiographs of April 13, 2015. FINDINGS: These images demonstrate internal pinning of proximal left femoral neck fracture. Good alignment of fracture components is noted. IMPRESSION: Status post surgical internal fixation of proximal left femoral neck fracture. Electronically Signed   By: Marijo Conception, M.D.   On: 02/13/2016 13:51   Dg Hip Operative Unilat W Or W/o Pelvis Left  Result Date: 02/13/2016 CLINICAL DATA:  Left hip pinning. EXAM: OPERATIVE left HIP (WITH PELVIS IF PERFORMED) 2 VIEWS TECHNIQUE: Fluoroscopic spot image(s) were submitted for interpretation post-operatively. FLUOROSCOPY TIME:  1 minutes 6 seconds. COMPARISON:  Radiographs of April 13, 2015. FINDINGS: These images demonstrate internal pinning of proximal left femoral neck fracture. Good alignment of fracture components is noted. IMPRESSION: Status post surgical internal fixation of proximal left femoral neck fracture. Electronically Signed   By: Marijo Conception, M.D.   On: 02/13/2016 13:51   Dg Femur Min 2 Views Left  Result Date: 02/11/2016 CLINICAL DATA:  Left hip pain after fall at nursing facility. EXAM: LEFT FEMUR 2 VIEWS COMPARISON:  None. FINDINGS: Almyra Brace is seen in left femoral neck concerning for nondisplaced fracture. MRI may be performed for further evaluation. Remaining portion of left femur appears normal. IMPRESSION: Linear lucency seen in left femoral neck concerning for nondisplaced fracture. MRI may be performed  for further evaluation. Electronically Signed   By: Marijo Conception,  M.D.   On: 02/11/2016 16:54    Assessment/Plan 1. Dysphagia, oropharyngeal phase Wants to go see GI about an esophageal dilation but has dysphagia noted on MBS with recommendation for D3 diet with NTL.  He is able to swallow liquid and has an apt on 10/2 to see GI. We will see if we can put him on the cancellation list as well.  He is going to work with ST this afternoon for strategies to help with his aspiration risk.  2. Sinus bradycardia 12 lead ekg  3. Anorexia He wants to try Remeron 7.5 mg qhs Monitor weights  Continue supplements 4 x day Add calorie dense soups to tray  Family/ staff Communication: discussed with resident  Labs/tests ordered:  CBC BMP, MG

## 2016-02-25 NOTE — Telephone Encounter (Signed)
Pt calling stating he would like to have his esophagus dilated. States Dr. Henrene Pastor has done this several times. He reports he is having problems swallowing and he choked on some food 2 days ago. Pt is at [facility] in the rehab area. Pt had modified barium swallow done in the hospital in August. Per [facility] note from yesterday pt needs speech therapy but will not consent to this unless covered by insurance. Pt also does not want diet changed any further because he does not like thickened liquids. Pt scheduled to see Alonza Bogus PA 03/07/16@2pm . Pt aware of appt.

## 2016-02-26 ENCOUNTER — Telehealth: Payer: Self-pay | Admitting: Nurse Practitioner

## 2016-02-26 NOTE — Telephone Encounter (Signed)
The nursing home called need an  EKG looked at ASAP please. Will fax it to the DOD   '   Call them back with findings please.

## 2016-02-26 NOTE — Telephone Encounter (Signed)
Per Dr. Caryl Comes- EKG received- he reviewed this and tried to call the provider back.  He was given their direct cell number and left a message on their phone that there was no change from the previous EKG.

## 2016-02-29 ENCOUNTER — Telehealth: Payer: Self-pay

## 2016-02-29 NOTE — Telephone Encounter (Signed)
Pts wife showed up in the office very upset, stated that her husband needs his esophagus stretched asap. States that he cannot eat and he is not able to drink. Pt is at [facility] rehab facility. Wife states the physician there has not helped them much and she wants something done now. Discussed with pts wife that if he cannot eat or drink she should have him taken to the ER>

## 2016-03-07 ENCOUNTER — Encounter: Payer: Self-pay | Admitting: Gastroenterology

## 2016-03-07 ENCOUNTER — Ambulatory Visit (INDEPENDENT_AMBULATORY_CARE_PROVIDER_SITE_OTHER): Payer: Medicare Other | Admitting: Gastroenterology

## 2016-03-07 ENCOUNTER — Other Ambulatory Visit: Payer: Self-pay

## 2016-03-07 ENCOUNTER — Ambulatory Visit: Payer: Self-pay | Admitting: Internal Medicine

## 2016-03-07 VITALS — BP 118/60 | HR 78 | Ht 70.0 in | Wt 152.0 lb

## 2016-03-07 DIAGNOSIS — R1319 Other dysphagia: Secondary | ICD-10-CM | POA: Diagnosis not present

## 2016-03-07 DIAGNOSIS — K222 Esophageal obstruction: Secondary | ICD-10-CM

## 2016-03-07 NOTE — Patient Instructions (Signed)
If you are age 80 or older, your body mass index should be between 23-30. Your Body mass index is 21.81 kg/m. If this is out of the aforementioned range listed, please consider follow up with your Primary Care Provider.  If you are age 95 or younger, your body mass index should be between 19-25. Your Body mass index is 21.81 kg/m. If this is out of the aformentioned range listed, please consider follow up with your Primary Care Provider.   You have been scheduled for a Barium Esophogram at [facility] (1st floor of the hospital) on Thursday October 12th at 10:30am. Please arrive 15 minutes prior to your appointment for registration. Make certain not to have anything to eat or drink 6 hours prior to your test. If you need to reschedule for any reason, please contact radiology at [phone] to do so. __________________________________________________________________ A barium swallow is an examination that concentrates on views of the esophagus. This tends to be a double contrast exam (barium and two liquids which, when combined, create a gas to distend the wall of the oesophagus) or single contrast (non-ionic iodine based). The study is usually tailored to your symptoms so a good history is essential. Attention is paid during the study to the form, structure and configuration of the esophagus, looking for functional disorders (such as aspiration, dysphagia, achalasia, motility and reflux) EXAMINATION You may be asked to change into a gown, depending on the type of swallow being performed. A radiologist and radiographer will perform the procedure. The radiologist will advise you of the type of contrast selected for your procedure and direct you during the exam. You will be asked to stand, sit or lie in several different positions and to hold a small amount of fluid in your mouth before being asked to swallow while the imaging is performed .In some instances you may be asked to swallow barium  coated marshmallows to assess the motility of a solid food bolus. The exam can be recorded as a digital or video fluoroscopy procedure. POST PROCEDURE It will take 1-2 days for the barium to pass through your system. To facilitate this, it is important, unless otherwise directed, to increase your fluids for the next 24-48hrs and to resume your normal diet.  This test typically takes about 30 minutes to perform. __________________________________________________________________________________

## 2016-03-07 NOTE — Progress Notes (Signed)
     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 He is also on oxygen at nighttime and has history of coronary artery disease and has history of mitral valve repair. From a GI standpoint he has history of a distal esophageal stricture that was dilated to 19 mm in January 2013 by Dr. Henrene Pastor.  It is also noted that he had a modified barium swallow study in August of this year that showed mild oral phase dysphagia and moderate pharyngeal phase dysphagia.  He presents to our office today with complaints of issues swallowing. Overall was doing okay but says that he "got choked" about 10 days ago when he was eating breakfast. That was really the only major occurrence with issues swallowing recently. When he describes this he points high in his esophagus/throat. Does not feel like food gets hung up necessarily.  He is currently at [facility] while recovering from a hip fracture, which required surgery.  Current Medications, Allergies, Past Medical History, Past Surgical History, Family History and Social History were reviewed in [employer] record.   Physical Exam: BP 118/60 (BP Location: Right Arm, Patient Position: Sitting, Cuff Size: Normal)   Pulse 78   Ht 5\' 10"  (1.778 m)   Wt 152 lb (68.9 kg)   BMI 21.81 kg/m  General:  Frail white male in no acute distress Head: Normocephalic and atraumatic.  Deformities on the right from extensive surgery. Eyes:  Sclerae anicteric, conjunctiva pink  Ears:  HOH.  Partial resection of right ear. Lungs: Clear throughout to auscultation Heart: Regular rate and rhythm Abdomen: Soft, non-distended.  Normal bowel sounds.  Non-tender. Musculoskeletal:  Symmetrical with no gross deformities  Extremities: No edema  Psychological:  Alert and cooperative. Normal mood and affect  Assessment and Recommendations: *82 year old male with complaints of dysphagia. He does have a history of an esophageal stricture that was balloon dilated in 2013, but also was found to have mild oral phase dysphagia and moderate pharyngeal phase dysphagia on recent modified barium swallow study. The symptoms that he describes to me do not sound esophageal/stricture related. He is extremely high risk for procedure. We'll perform esophagram to further assess. -Multiple medical problems

## 2016-03-08 NOTE — Progress Notes (Signed)
Agree. Would avoid endoscopy unless overwhelming abnormality found

## 2016-03-09 ENCOUNTER — Encounter: Payer: Self-pay | Admitting: Internal Medicine

## 2016-03-17 ENCOUNTER — Ambulatory Visit (HOSPITAL_COMMUNITY)
Admission: RE | Admit: 2016-03-17 | Discharge: 2016-03-17 | Disposition: A | Discharge status: Home / Self Care | Payer: Medicare Other | Source: Ambulatory Visit | Attending: Gastroenterology | Admitting: Gastroenterology

## 2016-03-17 DIAGNOSIS — R1319 Other dysphagia: Secondary | ICD-10-CM

## 2016-03-17 DIAGNOSIS — K222 Esophageal obstruction: Secondary | ICD-10-CM | POA: Insufficient documentation

## 2016-03-17 DIAGNOSIS — R1310 Dysphagia, unspecified: Secondary | ICD-10-CM | POA: Diagnosis present

## 2016-04-01 ENCOUNTER — Encounter: Payer: Self-pay | Admitting: Adult Health

## 2016-04-01 ENCOUNTER — Non-Acute Institutional Stay (SKILLED_NURSING_FACILITY): Payer: Medicare Other | Admitting: Adult Health

## 2016-04-01 DIAGNOSIS — R42 Dizziness and giddiness: Secondary | ICD-10-CM | POA: Diagnosis not present

## 2016-04-01 DIAGNOSIS — E43 Unspecified severe protein-calorie malnutrition: Secondary | ICD-10-CM | POA: Diagnosis not present

## 2016-04-01 DIAGNOSIS — C089 Malignant neoplasm of major salivary gland, unspecified: Secondary | ICD-10-CM | POA: Diagnosis not present

## 2016-04-01 DIAGNOSIS — R1319 Other dysphagia: Secondary | ICD-10-CM

## 2016-04-01 DIAGNOSIS — R0609 Other forms of dyspnea: Secondary | ICD-10-CM

## 2016-04-01 DIAGNOSIS — S72002S Fracture of unspecified part of neck of left femur, sequela: Secondary | ICD-10-CM

## 2016-04-01 DIAGNOSIS — R000 Tachycardia, unspecified: Secondary | ICD-10-CM

## 2016-04-01 DIAGNOSIS — G510 Bell's palsy: Secondary | ICD-10-CM | POA: Diagnosis not present

## 2016-04-01 DIAGNOSIS — D72820 Lymphocytosis (symptomatic): Secondary | ICD-10-CM

## 2016-04-01 DIAGNOSIS — E039 Hypothyroidism, unspecified: Secondary | ICD-10-CM

## 2016-04-01 DIAGNOSIS — R634 Abnormal weight loss: Secondary | ICD-10-CM

## 2016-04-01 NOTE — Progress Notes (Signed)
Patient ID: Adrian West, male   DOB: Oct 15, 1927, 82 y.o.   MRN: [id number]  Location:  [facility] of Service:  SNF (31)rehab Provider:   Cindi Carbon, [place] [phone]  [name], MD  Patient Care Team: [name], MD as PCP - General (Internal Medicine) [name], MD as Attending Physician (Internal Medicine) [name], MD as Consulting Physician (Otolaryngology) [name], MD as Consulting Physician (Ophthalmology) [name], MD as Consulting Physician (Cardiology) [name], DO as Consulting Physician (Neurology) [name], MD as Referring Physician (Otolaryngology) [name], MD as Attending Physician (Radiation Oncology) [name], RN as Oncology Nurse Navigator Brand Males, MD as Consulting Physician (Pulmonary Disease) [name], MD as Referring Physician (Otolaryngology) [name], MD as Consulting Physician (Dermatology)  Extended Emergency Contact Information Primary Emergency Contact: [name] Phone: [phone] Mobile Phone: [phone] Relation: Daughter Secondary Emergency Contact: [name],[name] Address: [place], [place] Phone: [phone] Relation: Spouse  Code Status:  DNR Goals of care: Advanced Directive information Advanced Directives 04/01/2016  Does patient have an advance directive? Yes  Type of Paramedic of [place];Living will  Does patient want to make changes to advanced directive? -  Copy of advanced directive(s) in chart? Yes  Would patient like information on creating an advanced directive? -     Chief Complaint  Patient presents with  . Discharge Note    HPI:  Pt is a 82 y.o. male seen today for evaluation for discharge to independent living. He has a hx of neck dissection/parotidectomy  due to salivary gland malignant neoplasm and radiation in 2015.  He is currently in rehab for therapy after a fall with subsequent hip fx. His current status is WBAT. He is due to see Dr. Alvan Dame on 11/1 and would like to return home then.  He is working well with therapy and ambulating with a walker. He is a high fall risk due to hx of falls, poor intake, and dizziness.   He has noted dysphagia on MBS and is currently trying thickened liquids to see if this helps with his cough. Esophogram showed a stricture on 10/12.  He reports Dr. Henrene Pastor did not want to perform dilation for fear of complications while under sedation. He is going to see an ENT next week that may be able to perform the procedure without sedation.  Also speech therapy was recommending evaluation for an oral prosthesis.   Has a chronic cough with sputum production and chronic sinus issues. Uses a xopenex and oxygen at night. No fever. He reports increasing dizziness but says that this has been a problem for 10 years.   During his stay his pulse was running low in the 30-50 range. An EKG was obtained showing afib with complete RBBB and PVCs.  This was faxed to cards with no new orders.  He is certainly not a good candidate for anticoagulation due to falls. Now his pulse is in the 104-108 range. He denies any sob or doe or cp.  Has chronic DOE due to previous hx of mitral valve repair with subsequent diaphragm paralysis.  Restrictive physiology noted on PFTs.   He was started on Remeron during his stay for decreased appetite and weight loss. His appetite has not improved but he is now sleeping well.  Past Medical History:  Diagnosis Date  . Anorexia 08/16/2014  . Bell's palsy   . Benign neoplasm of colon   . 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 Deaf   . Diaphragm paralysis   . Diverticulosis   . DNR (do not resuscitate) discussion 07/22/2014  . Dyspnea and respiratory abnormality 03/10/2014  . 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 Interstitial lung disease 10/15/13 Xray [facility]) 01/02/12 new subsegmental ATX on L (vs 09/03/11) Seeing Dr Theda Sers [facility]  Seen by Dr Corrin Parker, [facility],  02/2011. Component of thoracic height loss & alcohol related neuropathy questioned. No plication recommended  5/4- 10/14/2013 @ [facility] ; Dr  Ellis Parents.10/15/13 new focal consolidation mid anterior L lung:aspiration vs PNA . Underlying interstitial lung disease.10/15/13 Rx sent to [facility] for Doxycycline & Augmentin by Dr Jeannine Kitten ( not picked up as of 10/18/13).  Flew back to [facility]; seen @ 10/16/13 [facility], [place]. Levaquin X 5 days , Dr Evalee Mutton    . Esophageal stricture   . Gait disturbance   . GERD 02/14/2008   Qualifier: Diagnosis of  By: [name] MD, [name]    . Hiatal hernia   . History of radiation therapy 06/11/14-07/22/14   skin cancer of scalp/face  . HYPERTENSION 02/14/2008       . HYPOTHYROIDISM 02/14/2008  . Interstitial lung disease ([place]) 05/05/2014  . Loss of weight 07/22/2014  . Lung abnormality 11/25/2008   "nerve damage from heart surgery"  . Memory loss 12/31/2009   Qualifier: Diagnosis of  By: [name] MD, [name]    . MGUS (monoclonal gammopathy of unknown significance) 04/11/2013  . MITRAL REGURGITATION 02/14/2008   Qualifier: Diagnosis of  By: [name] MD, Cherlynn Kaiser MV surgery 11/2008, [name].complcated by paralysis of diaphragm     . 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 Neuropathy, peripheral ([place])   . Other and unspecified hyperlipidemia 02/24/2012  . Polycythemia, secondary 01/10/2014  . PVC's (premature ventricular contractions) 04/05/2011  . Radiation 06/11/14- 07/22/14   Right parotid bed,  posterior auricular, right upper neck.  . 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 Skin cancer of scalp or skin of neck 05/21/2014  . SPINAL STENOSIS, LUMBAR 07/28/2010   Qualifier: Diagnosis of  By: [name] MD, [name], NS ( retired)    . Squamous carcinoma   . TIA (transient ischemic attack) May 2012  . Tremor 2015  . Tremor, essential   . Vertigo 2005  . Vertigo 2005  . Weakness generalized 07/22/2014   Past Surgical History:  Procedure Laterality Date  . APPENDECTOMY    . BROW LIFT Right 04/25/2014   Midforehead  . CARDIAC CATHETERIZATION    . CARDIAC VALVE REPLACEMENT    . CATARACT EXTRACTION     [name]  . CHOLECYSTECTOMY  1998   [name]  . ECTROPION REPAIR Right 04/25/2014   Right tarsal strip  . ESOPHAGEAL DILATION      X3;[name]  . HIP PINNING,CANNULATED Left 02/13/2016   Procedure: CANNULATED LEFT HIP PINNING;  Surgeon: [name], MD;  Location: [facility];  Service: Orthopedics;  Laterality: Left;  . INGUINAL HERNIA REPAIR    . LAPAROSCOPIC ABLATION RENAL MASS  08/14/2009  . LUMBAR LAMINECTOMY    . 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 MOHS SURGERY  2012    ear  . NASAL SINUS SURGERY    . Nasolabial repair Right 04/25/2014   Right static sling using AlloDerm graft elevation of the nasolabial fold  . NECK DISSECTION Right 04/25/2014  . PAROTIDECTOMY Right 04/25/2014   Sacrifice of the facial nerve  . Partial auriculectomy Right 04/25/2014  . RIGHT HEART CATHETERIZATION N/A 03/03/2014   Procedure: RIGHT HEART CATH;  Surgeon: [name], MD;  Location: [facility] C[facility] LAB;  Service: Cardiovascular;  Laterality: N/A;  . TILT TABLE STUDY N/A 05/06/2013   Procedure: TILT TABLE STUDY;  Surgeon: [name], MD;  Location: [facility] CATH LAB;  Service: Cardiovascular;  Laterality: N/A;  . TONSILLECTOMY       Allergies  Allergen Reactions  . Plavix [Clopidogrel] Other (See Comments)    Cannot take med      Medication List       Accurate as of 04/01/16 11:59 PM. Always use your most recent med list.          acetaminophen 325 MG tablet Commonly known as:  TYLENOL Take 2 tablets (650 mg total) by mouth every 6 (six) hours as needed for mild pain (or Fever >/= 101).   antiseptic oral rinse Liqd 15 mLs by Mouth Rinse route 4 (four) times daily.   aspirin 81 MG tablet Take 81 mg by mouth daily.   bisacodyl 5 MG EC tablet Commonly known as:  DULCOLAX Take 5 mg by mouth daily as needed for moderate constipation.   BOOST PLUS PO Take 1 Can by mouth 4 (four) times daily.   ferrous sulfate 325 (65 FE) MG tablet Take 1 tablet (325 mg total) by mouth 2 (two) times daily with a meal. For 2 weeks   HYDROcodone-acetaminophen 5-325 MG tablet Commonly known as:  NORCO/VICODIN Take 1-2 tablets by mouth every 6 (six) hours as needed for moderate pain.   levalbuterol 0.63 MG/3ML nebulizer solution Commonly known as:  XOPENEX Take 0.63 mg by nebulization 2 (two) times daily.   levothyroxine 125 MCG tablet Commonly known as:  SYNTHROID, LEVOTHROID Take 125 mcg by mouth daily before breakfast.   Melatonin 5 MG Tabs Take 1 tablet by mouth at bedtime as needed.   mirtazapine 15 MG tablet Commonly known as:  REMERON Take 15 mg by mouth at bedtime.   OXYGEN Inhale into the lungs. 2 liters nightly   polyethylene glycol packet Commonly known as:  MIRALAX / GLYCOLAX Take 17 g by mouth daily as needed for mild constipation.       Review of Systems  Constitutional: Positive for appetite change. Negative for activity change, chills, diaphoresis, fatigue, fever and unexpected weight change.  HENT: Positive for congestion, rhinorrhea and trouble swallowing. Negative for sneezing and sore throat.   Respiratory: Positive for cough (chronic sputum production) and shortness of breath (on  exertion). Negative for wheezing and stridor.   Cardiovascular: Positive for leg swelling. Negative for chest pain and palpitations.  Gastrointestinal: Negative for abdominal distention, abdominal pain, constipation and diarrhea.  Genitourinary: Negative for difficulty urinating and dysuria.  Musculoskeletal: Positive for arthralgias and gait problem. Negative for back pain, joint swelling and myalgias.  Neurological: Positive for dizziness, facial asymmetry (bells palsy) and  speech difficulty. Negative for seizures, syncope and headaches.       Bells palsy, vertigo  Hematological: Negative for adenopathy. Does not bruise/bleed easily.  Psychiatric/Behavioral: Negative for agitation, behavioral problems and confusion.    Immunization History  Administered Date(s) Administered  . Influenza Split 03/07/2011, 02/24/2012  . Influenza Whole 03/09/2009, 02/11/2010  . Influenza,inj,Quad PF,36+ Mos 02/11/2013, 02/13/2014  . Pneumococcal Polysaccharide-23 05/11/2004, 02/11/2010  . Pneumococcal-Unspecified 04/06/2013  . Td 08/17/2005   Pertinent  Health Maintenance Due  Topic Date Due  . PNA vac Low Risk Adult (2 of 2 - PCV13) 04/06/2014  . INFLUENZA VACCINE  01/05/2016   Fall Risk  08/14/2015 08/18/2014 05/21/2014  Falls in the past year? No No No  Risk for fall due to : - - Impaired balance/gait;Impaired mobility   Functional Status Survey:   Weight: 149 lb (67.6 kg)  Wt Readings from Last 3 Encounters:  04/01/16 149 lb (67.6 kg)  03/07/16 152 lb (68.9 kg)  02/25/16 (P) 151 lb 9.6 oz (68.8 kg)   Vitals:   04/01/16 1032  BP: 132/82  Pulse: (!) 107  Resp: (!) 25  Temp: 97.4 F (36.3 C)  SpO2: 94%  Weight: 149 lb (67.6 kg)   Body mass index is 21.38 kg/m. Physical Exam  Constitutional: He is oriented to person, place, and time. No distress.  HENT:  Head: Normocephalic and atraumatic.  Neck: Normal range of motion. No JVD present. No tracheal deviation present. No thyromegaly  present.  Right periauricular removal  Cardiovascular: Regular rhythm.   No murmur heard. Elevated rate 107, regular, no edema  Pulmonary/Chest: Effort normal and breath sounds normal. No respiratory distress. He has no wheezes.  decreased  Abdominal: Soft. Bowel sounds are normal. He exhibits no distension. There is no tenderness.  Lymphadenopathy:    He has no cervical adenopathy.  Neurological: He is alert and oriented to person, place, and time. No cranial nerve deficit.  Right sided facial paralysis  Skin: Skin is warm and dry. He is not diaphoretic.  Psychiatric: He has a normal mood and affect.  Vitals reviewed.   Labs reviewed:  Recent Labs  02/11/16 1605 02/13/16 1842 02/14/16 0443 02/15/16 0339  NA 135  --  135 136  K 4.2  --  4.1 4.5  CL 100*  --  101 101  CO2 28  --  28 29  GLUCOSE 100*  --  123* 95  BUN 15  --  12 15  CREATININE 0.90 0.66 0.76 0.80  CALCIUM 8.9  --  8.6* 8.7*   No results for input(s): AST, ALT, ALKPHOS, BILITOT, PROT, ALBUMIN in the last 8760 hours.  Recent Labs  02/11/16 1605 02/13/16 1842 02/14/16 0443 02/15/16 0339  WBC 7.6 6.4 5.2 4.4  NEUTROABS 6.5  --   --   --   HGB 14.4 14.2 14.3 14.4  HCT 43.8 43.4 43.7 44.2  MCV 96.7 98.2 97.5 98.0  PLT 201 183 176 177   Lab Results  Component Value Date   TSH 6.504 (H) 02/14/2016   Lab Results  Component Value Date   HGBA1C 5.5 03/06/2013   Lab Results  Component Value Date   CHOL 166 10/22/2013   HDL 37.80 (L) 10/22/2013   LDLCALC 98 10/22/2013   LDLDIRECT 145.2 09/20/2010   TRIG 152.0 (H) 10/22/2013   CHOLHDL 4 10/22/2013    Significant Diagnostic Results in last 30 days:  Dg Esophagus  Result Date: 03/17/2016 CLINICAL DATA:  Patient with history of  recurrent distal esophageal strictures. Recent episode of choking. Evaluate for recurrent stricture. EXAM: ESOPHOGRAM/BARIUM SWALLOW TECHNIQUE: Single contrast examination was performed using  thin barium. FLUOROSCOPY TIME:   Fluoroscopy Time:  1 minutes 30 seconds Radiation Exposure Index (if provided by the fluoroscopic device): 10.5 mGy COMPARISON:  Chest radiograph 02/11/2016 FINDINGS: Contrast flows freely through the esophagus. Within the distal esophagus there is a persistent focal ring-like narrowing. Unremarkable esophageal motility. No reflux demonstrated during the examination. Given patient's difficulty with swallowing and history of aspiration, in consultation with the patient, the barium tablet was not administered. IMPRESSION: Persistent focal ring-like narrowing within the distal esophagus most compatible stricture. Note the barium tablet was not administered as the patient has had recent episodes of aspiration and felt the size of the pill may lead to choking. Electronically Signed   By: Lovey Newcomer M.D.   On: 03/17/2016 11:44    Assessment/Plan  1. Closed fracture of left hip, sequela -WBAT  -doing well with PT -has not used norco so will d/c -f/u with Dr. Alvan Dame on 11/1  2. 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 DYSPHAGIA Continues to have a chronic cough due to this as well as a hx of sinusitis Has esophageal stricture as well Going to ENT at [facility] about this issue next week -currently on a trial of thickened liquid which has improved his situation and so he should continue this at home  -has declined feeding tube   4. Right-sided Bell's palsy Noted   5. Loss of weight -down 3 lbs in the past month -would continue Remeron at 15 mg qhs as it seems to help with his sleep -continue Boost plus QID -monitor weights 2 x week  6. Protein-calorie malnutrition, severe ([place]) See above  7. Dizziness Has had this for 10 years  Continue to monitor fluid status and encourage water intake Wears compression hose, could use the kind with a zipper if he has trouble getting them on/off  8. Exertional dyspnea -Long term issue due to diaphragm paralysis, also hx of ILD -continue  oxygen at night and xopenex BID  9. Monoclonal B-cell lymphocytosis followed by oncology  10. Tachycardia -noted today at 107, previously was running in the 40's -encourage fluids and check BMP  -no cp, change in breathing, palpitations  11. Hypothyroidism -elevated in Sept on 125 mcg of Synthroid -check TSH as he missed his apt due to his fall and hospitalization  Mr. Sellards will be ready for discharge next week (pending that his labs are normal) but his situation is tenuous due to his multiple health problems. We discussed using home caregivers to help with his care at home. He is able to perform his ADL's independently but could use someone to perform task around the home, prepare meals, and help with his compression hose. He is a significant fall risk and should use his walker at all times. He will need to f/u with oncology (due in sept but he was in rehab).  F/U with ortho as indicated. He is at risk for aspiration but has declined a feeding tube.  He is a DNR.  F/U with ENT regarding cough, swallowing issues.   Family/ staff Communication: discussed with resident/nurse Labs/tests ordered:  CBC BMP

## 2016-04-06 ENCOUNTER — Other Ambulatory Visit: Payer: Self-pay | Admitting: *Deleted

## 2016-04-06 MED ORDER — LEVOTHYROXINE SODIUM 112 MCG PO TABS: 112.0000 ug | ORAL_TABLET | Freq: Every day | ORAL | 0 refills | Status: DC

## 2016-11-25 ENCOUNTER — Encounter (HOSPITAL_COMMUNITY): Payer: Self-pay | Admitting: *Deleted

## 2016-11-25 ENCOUNTER — Emergency Department (HOSPITAL_COMMUNITY)
Admission: EM | Admit: 2016-11-25 | Discharge: 2016-11-25 | Disposition: A | Discharge status: Home / Self Care | Payer: Medicare Other | Attending: Emergency Medicine | Admitting: Emergency Medicine

## 2016-11-25 ENCOUNTER — Emergency Department (HOSPITAL_COMMUNITY): Payer: Medicare Other

## 2016-11-25 DIAGNOSIS — Z79899 Other long term (current) drug therapy: Secondary | ICD-10-CM | POA: Diagnosis not present

## 2016-11-25 DIAGNOSIS — Z7982 Long term (current) use of aspirin: Secondary | ICD-10-CM | POA: Diagnosis not present

## 2016-11-25 DIAGNOSIS — I10 Essential (primary) hypertension: Secondary | ICD-10-CM | POA: Insufficient documentation

## 2016-11-25 DIAGNOSIS — Y9389 Activity, other specified: Secondary | ICD-10-CM | POA: Diagnosis not present

## 2016-11-25 DIAGNOSIS — E039 Hypothyroidism, unspecified: Secondary | ICD-10-CM | POA: Insufficient documentation

## 2016-11-25 DIAGNOSIS — W19XXXA Unspecified fall, initial encounter: Secondary | ICD-10-CM

## 2016-11-25 DIAGNOSIS — S0990XA Unspecified injury of head, initial encounter: Secondary | ICD-10-CM | POA: Diagnosis present

## 2016-11-25 DIAGNOSIS — Y929 Unspecified place or not applicable: Secondary | ICD-10-CM | POA: Diagnosis not present

## 2016-11-25 DIAGNOSIS — Y999 Unspecified external cause status: Secondary | ICD-10-CM | POA: Insufficient documentation

## 2016-11-25 DIAGNOSIS — W1809XA Striking against other object with subsequent fall, initial encounter: Secondary | ICD-10-CM | POA: Insufficient documentation

## 2016-11-25 DIAGNOSIS — S0101XA Laceration without foreign body of scalp, initial encounter: Secondary | ICD-10-CM | POA: Diagnosis not present

## 2016-11-25 MED ORDER — LIDOCAINE HCL 2 % IJ SOLN
20.0000 mL | Freq: Once | INTRAMUSCULAR | Status: DC
  Filled 2016-11-25: qty 20
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
## 2016-11-25 NOTE — ED Provider Notes (Signed)
Medical screening examination/treatment/procedure(s) were conducted as a shared visit with non-physician practitioner(s) and myself.  I personally evaluated the patient during the encounter.   EKG Interpretation None        Charlesetta Shanks, MD 12/16/16 [phone]

## 2016-11-25 NOTE — ED Notes (Signed)
Bed: [facility] Expected date:  Expected time:  Means of arrival:  Comments: 

## 2016-11-25 NOTE — Discharge Instructions (Signed)
Please read attached information. If you experience any new or worsening signs or symptoms please return to the emergency room for evaluation. Please follow-up with your primary care provider or specialist as discussed.  °
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
## 2017-02-15 ENCOUNTER — Telehealth: Payer: Self-pay | Admitting: Nurse Practitioner

## 2017-02-15 NOTE — Telephone Encounter (Signed)
Walk In Pt Form-Balance Disorder paperwork dropped off. Sent one copy interoffice to Dr.McLean & other copy in [employer], [employer].

## 2017-02-22 ENCOUNTER — Encounter: Payer: Self-pay | Admitting: Nurse Practitioner

## 2017-02-22 ENCOUNTER — Ambulatory Visit (INDEPENDENT_AMBULATORY_CARE_PROVIDER_SITE_OTHER): Payer: Medicare Other | Admitting: Nurse Practitioner

## 2017-02-22 VITALS — BP 130/92 | HR 73 | Ht 70.0 in | Wt 142.0 lb

## 2017-02-22 DIAGNOSIS — I951 Orthostatic hypotension: Secondary | ICD-10-CM

## 2017-02-22 DIAGNOSIS — R627 Adult failure to thrive: Secondary | ICD-10-CM

## 2017-02-22 DIAGNOSIS — I480 Paroxysmal atrial fibrillation: Secondary | ICD-10-CM

## 2017-02-22 NOTE — Patient Instructions (Addendum)
We will be checking the following labs today - BMET, CBC and TSH   Medication Instructions:    Continue with your current medicines.     Testing/Procedures To Be Arranged:  24 hour Holter  Follow-Up:   See me in about 3 months    Other Special Instructions:   N/A    If you need a refill on your cardiac medications before your next appointment, please call your pharmacy.   Call the [place] office at [phone] if you have any questions, problems or concerns.
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
## 2017-02-23 LAB — CBC
Hematocrit: 49.3 % (ref 37.5–51.0)
Hemoglobin: 16.4 g/dL (ref 13.0–17.7)
MCH: 32.4 pg (ref 26.6–33.0)
MCHC: 33.3 g/dL (ref 31.5–35.7)
MCV: 97 fL (ref 79–97)
Platelets: 192 10*3/uL (ref 150–379)
RBC: 5.06 x10E6/uL (ref 4.14–5.80)
RDW: 15 % (ref 12.3–15.4)
WBC: 5.6 10*3/uL (ref 3.4–10.8)

## 2017-02-23 LAB — BASIC METABOLIC PANEL
BUN/Creatinine Ratio: 15 (ref 10–24)
BUN: 15 mg/dL (ref 8–27)
CO2: 23 mmol/L (ref 20–29)
Calcium: 9.5 mg/dL (ref 8.6–10.2)
Chloride: 97 mmol/L (ref 96–106)
Creatinine, Ser: 0.98 mg/dL (ref 0.76–1.27)
GFR calc Af Amer: 80 mL/min/{1.73_m2} (ref >59)
GFR calc non Af Amer: 69 mL/min/{1.73_m2} (ref >59)
Glucose: 86 mg/dL (ref 65–99)
Potassium: 4.9 mmol/L (ref 3.5–5.2)
Sodium: 139 mmol/L (ref 134–144)

## 2017-02-23 LAB — TSH: TSH: 6.45 u[IU]/mL — ABNORMAL HIGH (ref 0.450–4.500)

## 2017-03-09 ENCOUNTER — Ambulatory Visit (INDEPENDENT_AMBULATORY_CARE_PROVIDER_SITE_OTHER): Payer: Medicare Other

## 2017-03-09 ENCOUNTER — Other Ambulatory Visit: Payer: Self-pay | Admitting: Nurse Practitioner

## 2017-03-09 DIAGNOSIS — I480 Paroxysmal atrial fibrillation: Secondary | ICD-10-CM

## 2017-03-09 DIAGNOSIS — R42 Dizziness and giddiness: Secondary | ICD-10-CM

## 2017-03-23 ENCOUNTER — Other Ambulatory Visit: Payer: Self-pay | Admitting: *Deleted

## 2017-03-23 MED ORDER — METOPROLOL SUCCINATE ER 25 MG PO TB24: 25.0000 mg | ORAL_TABLET | Freq: Every day | ORAL | 6 refills | Status: DC

## 2017-03-23 NOTE — Progress Notes (Signed)
Noted.   Ammar Moffatt C. Devaney Segers, RN, ANP-C [facility] [address] [place], [place]  [address] [phone]  

## 2017-04-07 ENCOUNTER — Telehealth: Payer: Self-pay

## 2017-04-07 NOTE — Telephone Encounter (Signed)
Echo technician brought a note into Triage stating the patient left a message on her VM to call to schedule appointment with Truitt Merle, NP. Called patient to confirm that he is already scheduled for an appointment with Cecille Rubin on Tuesday, 11/6, but hung up after several rings as no VM picked up to leave message.  Will try again later.

## 2017-04-11 ENCOUNTER — Encounter: Payer: Self-pay | Admitting: Nurse Practitioner

## 2017-04-11 ENCOUNTER — Telehealth: Payer: Self-pay | Admitting: *Deleted

## 2017-04-11 ENCOUNTER — Ambulatory Visit (INDEPENDENT_AMBULATORY_CARE_PROVIDER_SITE_OTHER): Payer: Medicare Other | Admitting: Nurse Practitioner

## 2017-04-11 VITALS — BP 140/80 | Ht 70.0 in | Wt 143.0 lb

## 2017-04-11 DIAGNOSIS — I951 Orthostatic hypotension: Secondary | ICD-10-CM | POA: Diagnosis not present

## 2017-04-11 DIAGNOSIS — I493 Ventricular premature depolarization: Secondary | ICD-10-CM

## 2017-04-11 DIAGNOSIS — I480 Paroxysmal atrial fibrillation: Secondary | ICD-10-CM | POA: Diagnosis not present

## 2017-04-11 MED ORDER — METOPROLOL SUCCINATE ER 25 MG PO TB24: 25.0000 mg | ORAL_TABLET | Freq: Every day | ORAL | 6 refills | Status: DC

## 2017-04-11 NOTE — Patient Instructions (Addendum)
We will be checking the following labs today - NONE   Medication Instructions:    Continue with your current medicines. BUT  I am going to restart the Toprol at 25 mg a day - give with a glass of V8 juice    Testing/Procedures To Be Arranged:  N/A  Follow-Up:   See me in 2 months    Other Special Instructions:   N/A    If you need a refill on your cardiac medications before your next appointment, please call your pharmacy.   Call the [place] office at [phone] if you have any questions, problems or concerns.

## 2017-04-11 NOTE — Telephone Encounter (Signed)
S/w [name] the Nurse, children's at [facility]. Stated Dr. [name] D/C metoprolol.  Pt forgot paperwork at [facility] will be faxed to office today.

## 2017-04-11 NOTE — Telephone Encounter (Signed)
Patient has an appointment to be seen today.
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
## 2017-05-15 ENCOUNTER — Ambulatory Visit: Payer: Self-pay | Admitting: Nurse Practitioner
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
## 2017-06-14 ENCOUNTER — Ambulatory Visit (INDEPENDENT_AMBULATORY_CARE_PROVIDER_SITE_OTHER): Payer: Medicare Other | Admitting: Nurse Practitioner

## 2017-06-14 ENCOUNTER — Encounter: Payer: Self-pay | Admitting: Nurse Practitioner

## 2017-06-14 ENCOUNTER — Telehealth: Payer: Self-pay | Admitting: *Deleted

## 2017-06-14 VITALS — BP 100/80 | HR 51 | Ht 70.0 in | Wt 145.8 lb

## 2017-06-14 DIAGNOSIS — I480 Paroxysmal atrial fibrillation: Secondary | ICD-10-CM | POA: Diagnosis not present

## 2017-06-14 DIAGNOSIS — I951 Orthostatic hypotension: Secondary | ICD-10-CM

## 2017-06-14 NOTE — Telephone Encounter (Signed)
Faxing to pt's assisted living facility, [facility], ov note. Fax # [phone] phone # [phone].

## 2017-06-14 NOTE — Patient Instructions (Addendum)
We will be checking the following labs today - NONE   Medication Instructions:    Continue with your current medicines.     Testing/Procedures To Be Arranged:  N/A  Follow-Up:   See me in 4 months    Other Special Instructions:   You can have salt    If you need a refill on your cardiac medications before your next appointment, please call your pharmacy.   Call the [place] office at [phone] if you have any questions, problems or concerns.

## 2017-10-10 ENCOUNTER — Encounter: Payer: Self-pay | Admitting: Nurse Practitioner
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
## 2017-10-11 ENCOUNTER — Ambulatory Visit: Payer: Medicare Other | Admitting: Nurse Practitioner

## 2017-10-11 ENCOUNTER — Encounter: Payer: Self-pay | Admitting: Nurse Practitioner

## 2017-10-11 ENCOUNTER — Telehealth: Payer: Self-pay | Admitting: *Deleted

## 2017-10-11 VITALS — BP 110/70 | HR 58 | Ht 70.0 in | Wt 155.4 lb

## 2017-10-11 DIAGNOSIS — I481 Persistent atrial fibrillation: Secondary | ICD-10-CM | POA: Diagnosis not present

## 2017-10-11 DIAGNOSIS — I4819 Other persistent atrial fibrillation: Secondary | ICD-10-CM

## 2017-10-11 DIAGNOSIS — R42 Dizziness and giddiness: Secondary | ICD-10-CM

## 2017-10-11 DIAGNOSIS — R627 Adult failure to thrive: Secondary | ICD-10-CM

## 2017-10-11 LAB — BASIC METABOLIC PANEL
BUN/Creatinine Ratio: 11 (ref 10–24)
BUN: 11 mg/dL (ref 8–27)
CO2: 25 mmol/L (ref 20–29)
Calcium: 9.2 mg/dL (ref 8.6–10.2)
Chloride: 96 mmol/L (ref 96–106)
Creatinine, Ser: 1.02 mg/dL (ref 0.76–1.27)
GFR calc Af Amer: 76 mL/min/{1.73_m2} (ref >59)
GFR calc non Af Amer: 66 mL/min/{1.73_m2} (ref >59)
Glucose: 79 mg/dL (ref 65–99)
Potassium: 4.9 mmol/L (ref 3.5–5.2)
Sodium: 135 mmol/L (ref 134–144)

## 2017-10-11 LAB — CBC
Hematocrit: 45.8 % (ref 37.5–51.0)
Hemoglobin: 15.8 g/dL (ref 13.0–17.7)
MCH: 32.9 pg (ref 26.6–33.0)
MCHC: 34.5 g/dL (ref 31.5–35.7)
MCV: 95 fL (ref 79–97)
Platelets: 151 10*3/uL (ref 150–379)
RBC: 4.8 x10E6/uL (ref 4.14–5.80)
RDW: 13.9 % (ref 12.3–15.4)
WBC: 5.6 10*3/uL (ref 3.4–10.8)

## 2017-10-11 LAB — TSH: TSH: 1.35 u[IU]/mL (ref 0.450–4.500)

## 2017-10-11 NOTE — Telephone Encounter (Signed)
Faxed ov note via computer to [employer], fax # is [phone], phone # is [phone].

## 2017-10-11 NOTE — Patient Instructions (Signed)
We will be checking the following labs today - BMET, CBC and TSH   Medication Instructions:    Continue with your current medicines.     Testing/Procedures To Be Arranged:  N/A  Follow-Up:   See me in 4 months again    Other Special Instructions:   N/A    If you need a refill on your cardiac medications before your next appointment, please call your pharmacy.   Call the [place] office at [phone] if you have any questions, problems or concerns.

## 2017-10-16 ENCOUNTER — Telehealth: Payer: Self-pay | Admitting: Nurse Practitioner

## 2017-10-16 NOTE — Telephone Encounter (Signed)
New message  Pt verbalized that he is returning call for RN  Form last week he states that he believes that   The rn want to give him a report

## 2017-11-11 ENCOUNTER — Other Ambulatory Visit: Payer: Self-pay | Admitting: Internal Medicine

## 2017-11-11 DIAGNOSIS — C760 Malignant neoplasm of head, face and neck: Secondary | ICD-10-CM

## 2017-11-11 DIAGNOSIS — R519 Headache, unspecified: Secondary | ICD-10-CM

## 2017-11-11 DIAGNOSIS — R51 Headache: Principal | ICD-10-CM
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
## 2018-02-20 ENCOUNTER — Ambulatory Visit: Payer: Self-pay | Admitting: Nurse Practitioner

## 2018-02-21 ENCOUNTER — Encounter: Payer: Self-pay | Admitting: Nurse Practitioner
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
## 2018-04-10 ENCOUNTER — Encounter: Payer: Self-pay | Admitting: Nurse Practitioner

## 2018-04-10 ENCOUNTER — Ambulatory Visit (INDEPENDENT_AMBULATORY_CARE_PROVIDER_SITE_OTHER): Payer: Medicare Other | Admitting: Nurse Practitioner

## 2018-04-10 VITALS — BP 110/60 | HR 62 | Ht 70.0 in | Wt 156.4 lb

## 2018-04-10 DIAGNOSIS — I4819 Other persistent atrial fibrillation: Secondary | ICD-10-CM | POA: Diagnosis not present

## 2018-04-10 DIAGNOSIS — R42 Dizziness and giddiness: Secondary | ICD-10-CM

## 2018-04-10 MED ORDER — ASPIRIN EC 81 MG PO TBEC: 81.0000 mg | DELAYED_RELEASE_TABLET | Freq: Every day | ORAL | 3 refills | Status: DC

## 2018-04-10 NOTE — Patient Instructions (Addendum)
We will be checking the following labs today - NONE  If you have labs (blood work) drawn today and your tests are completely normal, you will receive your results only by: . MyChart Message (if you have MyChart) OR . A paper copy in the mail If you have any lab test that is abnormal or we need to change your treatment, we will call you to review the results.   Medication Instructions:    Continue with your current medicines.    If you need a refill on your cardiac medications before your next appointment, please call your pharmacy.     Testing/Procedures To Be Arranged:  N/A  Follow-Up:   See me in 6 months    At [facility], you and your health needs are our priority.  As part of our continuing mission to provide you with exceptional heart care, we have created designated Provider Care Teams.  These Care Teams include your primary Cardiologist (physician) and Advanced Practice Providers (APPs -  Physician Assistants and Nurse Practitioners) who all work together to provide you with the care you need, when you need it.  Special Instructions:  . None  Call the [facility] office at [phone] if you have any questions, problems or concerns.       

## 2018-09-18 ENCOUNTER — Telehealth: Payer: Self-pay | Admitting: *Deleted

## 2018-09-18 NOTE — Telephone Encounter (Signed)
Tried calling both numbers on pt's file no answer and vm not set up, to let pt know moved appt to August due to Covid and pt being at [facility] on lockdown.  Send [facility] pool a message to mail pt a calendar of change in appt.

## 2018-10-17 ENCOUNTER — Ambulatory Visit: Payer: Self-pay | Admitting: Nurse Practitioner

## 2019-01-08 ENCOUNTER — Telehealth: Payer: Self-pay | Admitting: *Deleted

## 2019-01-08 NOTE — Telephone Encounter (Signed)
Called pt to see status of health and to see if appt date and time need to be changed due to the [place] and pt's facility being on lock down.  No answer on house phone and cell phone has no vm set up.  Will send to Cecille Rubin to advise.

## 2019-01-08 NOTE — Telephone Encounter (Signed)
Will just keep an eye on the upcoming visit and try again later.   Cecille Rubin
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
## 2019-01-21 ENCOUNTER — Ambulatory Visit: Payer: Medicare Other | Admitting: Nurse Practitioner

## 2019-03-06 LAB — NOVEL CORONAVIRUS, NAA: SARS-CoV-2, NAA: NOT DETECTED

## 2019-03-15 ENCOUNTER — Other Ambulatory Visit: Payer: Self-pay | Admitting: *Deleted

## 2020-01-09 ENCOUNTER — Encounter: Payer: Self-pay | Admitting: Nurse Practitioner

## 2020-01-20 ENCOUNTER — Telehealth: Payer: Self-pay | Admitting: *Deleted

## 2020-01-20 NOTE — Telephone Encounter (Signed)
S/w Candida Peeling, Supervisor at [facility] @ [phone] per Truitt Merle, NP.  Due to not seeing pt for a couple of years.  Making sure pt is OK.  Last couple appointments either canceled or NS.  Manuela Schwartz explained this is due to Covid.  Pt does have a Sept appt to see [facility] is on lockdown right now due to Covid.  Will keep in further contact if pt cannot keep upcoming appt.

## 2020-01-22 ENCOUNTER — Ambulatory Visit: Payer: Medicare Other | Admitting: Nurse Practitioner
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
## 2020-03-04 ENCOUNTER — Other Ambulatory Visit: Payer: Self-pay

## 2020-03-04 ENCOUNTER — Encounter: Payer: Self-pay | Admitting: Nurse Practitioner

## 2020-03-04 ENCOUNTER — Ambulatory Visit: Payer: Medicare Other | Admitting: Nurse Practitioner

## 2020-03-04 VITALS — BP 120/80 | HR 68 | Wt 162.0 lb

## 2020-03-04 DIAGNOSIS — R42 Dizziness and giddiness: Secondary | ICD-10-CM | POA: Diagnosis not present

## 2020-03-04 DIAGNOSIS — I4819 Other persistent atrial fibrillation: Secondary | ICD-10-CM | POA: Diagnosis not present

## 2020-03-04 DIAGNOSIS — Z9889 Other specified postprocedural states: Secondary | ICD-10-CM | POA: Diagnosis not present

## 2020-03-04 MED ORDER — ASPIRIN EC 81 MG PO TBEC: 81.0000 mg | DELAYED_RELEASE_TABLET | Freq: Every day | ORAL | 3 refills | Status: DC

## 2020-03-04 NOTE — Patient Instructions (Addendum)
After Visit Summary:  We will be checking the following labs today - NONE   Medication Instructions:    Continue with your current medicines.   Would like to continue baby aspirin daily 81 mg   If you need a refill on your cardiac medications before your next appointment, please call your pharmacy.     Testing/Procedures To Be Arranged:  N/A  Follow-Up:   See [place] back as needed.     At [facility], you and your health needs are our priority.  As part of our continuing mission to provide you with exceptional heart care, we have created designated Provider Care Teams.  These Care Teams include your primary Cardiologist (physician) and Advanced Practice Providers (APPs -  Physician Assistants and Nurse Practitioners) who all work together to provide you with the care you need, when you need it.  Special Instructions:  . Stay safe, wash your hands for at least 20 seconds and wear a mask when needed.  . It was good to talk with you today.    Call the [place] office at [phone] if you have any questions, problems or concerns.

## 2020-06-15 ENCOUNTER — Inpatient Hospital Stay (HOSPITAL_COMMUNITY)
Admission: EM | Admit: 2020-06-15 | Discharge: 2020-06-16 | DRG: 951 | Disposition: A | Discharge status: Skilled Nursing Facility | Payer: Medicare Other | Source: Skilled Nursing Facility | Attending: Internal Medicine | Admitting: Internal Medicine

## 2020-06-15 ENCOUNTER — Emergency Department (HOSPITAL_COMMUNITY): Payer: Medicare Other

## 2020-06-15 ENCOUNTER — Encounter (HOSPITAL_COMMUNITY): Payer: Self-pay | Admitting: Emergency Medicine

## 2020-06-15 DIAGNOSIS — D472 Monoclonal gammopathy: Secondary | ICD-10-CM | POA: Diagnosis present

## 2020-06-15 DIAGNOSIS — E039 Hypothyroidism, unspecified: Secondary | ICD-10-CM | POA: Diagnosis present

## 2020-06-15 DIAGNOSIS — I619 Nontraumatic intracerebral hemorrhage, unspecified: Secondary | ICD-10-CM | POA: Diagnosis present

## 2020-06-15 DIAGNOSIS — Z66 Do not resuscitate: Secondary | ICD-10-CM | POA: Diagnosis present

## 2020-06-15 DIAGNOSIS — Z789 Other specified health status: Secondary | ICD-10-CM | POA: Diagnosis not present

## 2020-06-15 DIAGNOSIS — Z23 Encounter for immunization: Secondary | ICD-10-CM | POA: Diagnosis present

## 2020-06-15 DIAGNOSIS — S42002A Fracture of unspecified part of left clavicle, initial encounter for closed fracture: Secondary | ICD-10-CM | POA: Diagnosis not present

## 2020-06-15 DIAGNOSIS — E785 Hyperlipidemia, unspecified: Secondary | ICD-10-CM | POA: Diagnosis present

## 2020-06-15 DIAGNOSIS — S42022A Displaced fracture of shaft of left clavicle, initial encounter for closed fracture: Secondary | ICD-10-CM | POA: Diagnosis present

## 2020-06-15 DIAGNOSIS — Z8249 Family history of ischemic heart disease and other diseases of the circulatory system: Secondary | ICD-10-CM

## 2020-06-15 DIAGNOSIS — Y92129 Unspecified place in nursing home as the place of occurrence of the external cause: Secondary | ICD-10-CM

## 2020-06-15 DIAGNOSIS — Z9049 Acquired absence of other specified parts of digestive tract: Secondary | ICD-10-CM

## 2020-06-15 DIAGNOSIS — Z85828 Personal history of other malignant neoplasm of skin: Secondary | ICD-10-CM

## 2020-06-15 DIAGNOSIS — Z515 Encounter for palliative care: Principal | ICD-10-CM

## 2020-06-15 DIAGNOSIS — R0989 Other specified symptoms and signs involving the circulatory and respiratory systems: Secondary | ICD-10-CM | POA: Diagnosis not present

## 2020-06-15 DIAGNOSIS — Z8049 Family history of malignant neoplasm of other genital organs: Secondary | ICD-10-CM | POA: Diagnosis not present

## 2020-06-15 DIAGNOSIS — Z79899 Other long term (current) drug therapy: Secondary | ICD-10-CM

## 2020-06-15 DIAGNOSIS — D751 Secondary polycythemia: Secondary | ICD-10-CM | POA: Diagnosis present

## 2020-06-15 DIAGNOSIS — I639 Cerebral infarction, unspecified: Secondary | ICD-10-CM | POA: Diagnosis present

## 2020-06-15 DIAGNOSIS — Z800 Family history of malignant neoplasm of digestive organs: Secondary | ICD-10-CM

## 2020-06-15 DIAGNOSIS — R4182 Altered mental status, unspecified: Secondary | ICD-10-CM | POA: Diagnosis not present

## 2020-06-15 DIAGNOSIS — I63512 Cerebral infarction due to unspecified occlusion or stenosis of left middle cerebral artery: Secondary | ICD-10-CM | POA: Diagnosis present

## 2020-06-15 DIAGNOSIS — I482 Chronic atrial fibrillation, unspecified: Secondary | ICD-10-CM | POA: Diagnosis present

## 2020-06-15 DIAGNOSIS — Z952 Presence of prosthetic heart valve: Secondary | ICD-10-CM

## 2020-06-15 DIAGNOSIS — J849 Interstitial pulmonary disease, unspecified: Secondary | ICD-10-CM | POA: Diagnosis present

## 2020-06-15 DIAGNOSIS — I10 Essential (primary) hypertension: Secondary | ICD-10-CM | POA: Diagnosis present

## 2020-06-15 DIAGNOSIS — Z8673 Personal history of transient ischemic attack (TIA), and cerebral infarction without residual deficits: Secondary | ICD-10-CM

## 2020-06-15 DIAGNOSIS — Z7189 Other specified counseling: Secondary | ICD-10-CM

## 2020-06-15 DIAGNOSIS — G9340 Encephalopathy, unspecified: Secondary | ICD-10-CM | POA: Diagnosis present

## 2020-06-15 DIAGNOSIS — W19XXXA Unspecified fall, initial encounter: Secondary | ICD-10-CM | POA: Diagnosis present

## 2020-06-15 DIAGNOSIS — Z79890 Hormone replacement therapy (postmenopausal): Secondary | ICD-10-CM

## 2020-06-15 DIAGNOSIS — Z8679 Personal history of other diseases of the circulatory system: Secondary | ICD-10-CM

## 2020-06-15 DIAGNOSIS — Z20822 Contact with and (suspected) exposure to covid-19: Secondary | ICD-10-CM | POA: Diagnosis present

## 2020-06-15 DIAGNOSIS — Z86010 Personal history of colonic polyps: Secondary | ICD-10-CM

## 2020-06-15 DIAGNOSIS — Z7982 Long term (current) use of aspirin: Secondary | ICD-10-CM

## 2020-06-15 DIAGNOSIS — F0390 Unspecified dementia without behavioral disturbance: Secondary | ICD-10-CM | POA: Diagnosis present

## 2020-06-15 DIAGNOSIS — Z85818 Personal history of malignant neoplasm of other sites of lip, oral cavity, and pharynx: Secondary | ICD-10-CM

## 2020-06-15 DIAGNOSIS — S0101XA Laceration without foreign body of scalp, initial encounter: Secondary | ICD-10-CM

## 2020-06-15 DIAGNOSIS — M48061 Spinal stenosis, lumbar region without neurogenic claudication: Secondary | ICD-10-CM | POA: Diagnosis present

## 2020-06-15 DIAGNOSIS — R29717 NIHSS score 17: Secondary | ICD-10-CM | POA: Diagnosis present

## 2020-06-15 DIAGNOSIS — Z923 Personal history of irradiation: Secondary | ICD-10-CM

## 2020-06-15 DIAGNOSIS — I959 Hypotension, unspecified: Secondary | ICD-10-CM | POA: Diagnosis present

## 2020-06-15 DIAGNOSIS — S3991XA Unspecified injury of abdomen, initial encounter: Secondary | ICD-10-CM

## 2020-06-15 LAB — RESP PANEL BY RT-PCR (FLU A&B, COVID) ARPGX2
Influenza A by PCR: NEGATIVE
Influenza B by PCR: NEGATIVE
SARS Coronavirus 2 by RT PCR: NEGATIVE

## 2020-06-15 LAB — CBC
HCT: 39.9 % (ref 39.0–52.0)
Hemoglobin: 13.4 g/dL (ref 13.0–17.0)
MCH: 31.8 pg (ref 26.0–34.0)
MCHC: 33.6 g/dL (ref 30.0–36.0)
MCV: 94.8 fL (ref 80.0–100.0)
Platelets: 203 10*3/uL (ref 150–400)
RBC: 4.21 MIL/uL — ABNORMAL LOW (ref 4.22–5.81)
RDW: 13 % (ref 11.5–15.5)
WBC: 17.6 10*3/uL — ABNORMAL HIGH (ref 4.0–10.5)
nRBC: 0 % (ref 0.0–0.2)

## 2020-06-15 LAB — COMPREHENSIVE METABOLIC PANEL
ALT: 16 U/L (ref 0–44)
AST: 28 U/L (ref 15–41)
Albumin: 3.6 g/dL (ref 3.5–5.0)
Alkaline Phosphatase: 63 U/L (ref 38–126)
Anion gap: 12 (ref 5–15)
BUN: 17 mg/dL (ref 8–23)
CO2: 25 mmol/L (ref 22–32)
Calcium: 9 mg/dL (ref 8.9–10.3)
Chloride: 95 mmol/L — ABNORMAL LOW (ref 98–111)
Creatinine, Ser: 0.91 mg/dL (ref 0.61–1.24)
GFR, Estimated: >60 mL/min (ref >60)
Glucose, Bld: 133 mg/dL — ABNORMAL HIGH (ref 70–99)
Potassium: 4.3 mmol/L (ref 3.5–5.1)
Sodium: 132 mmol/L — ABNORMAL LOW (ref 135–145)
Total Bilirubin: 1.6 mg/dL — ABNORMAL HIGH (ref 0.3–1.2)
Total Protein: 5.7 g/dL — ABNORMAL LOW (ref 6.5–8.1)

## 2020-06-15 LAB — I-STAT CHEM 8, ED
BUN: 21 mg/dL (ref 8–23)
Calcium, Ion: 1.15 mmol/L (ref 1.15–1.40)
Chloride: 94 mmol/L — ABNORMAL LOW (ref 98–111)
Creatinine, Ser: 0.7 mg/dL (ref 0.61–1.24)
Glucose, Bld: 124 mg/dL — ABNORMAL HIGH (ref 70–99)
HCT: 41 % (ref 39.0–52.0)
Hemoglobin: 13.9 g/dL (ref 13.0–17.0)
Potassium: 4.4 mmol/L (ref 3.5–5.1)
Sodium: 133 mmol/L — ABNORMAL LOW (ref 135–145)
TCO2: 26 mmol/L (ref 22–32)

## 2020-06-15 LAB — PROTIME-INR
INR: 1.2 (ref 0.8–1.2)
Prothrombin Time: 15 seconds (ref 11.4–15.2)

## 2020-06-15 LAB — ETHANOL: Alcohol, Ethyl (B): <10 mg/dL (ref <10)

## 2020-06-15 LAB — LACTIC ACID, PLASMA: Lactic Acid, Venous: 3.1 mmol/L (ref 0.5–1.9)

## 2020-06-15 LAB — TSH: TSH: 3.512 u[IU]/mL (ref 0.350–4.500)

## 2020-06-15 LAB — AMMONIA: Ammonia: 22 umol/L (ref 9–35)

## 2020-06-15 MED ORDER — HALOPERIDOL 1 MG PO TABS
2.0000 mg | ORAL_TABLET | Freq: Four times a day (QID) | ORAL | Status: DC | PRN
  Filled 2020-06-15: qty 2

## 2020-06-15 MED ORDER — LIDOCAINE-EPINEPHRINE-TETRACAINE (LET) TOPICAL GEL
3.0000 mL | Freq: Once | TOPICAL | Status: AC
  Administered 2020-06-15: 3 mL via TOPICAL
  Filled 2020-06-15: qty 3

## 2020-06-15 MED ORDER — SODIUM CHLORIDE 0.9 % IV BOLUS
500.0000 mL | Freq: Once | INTRAVENOUS | Status: AC
  Administered 2020-06-15: 500 mL via INTRAVENOUS

## 2020-06-15 MED ORDER — ACETAMINOPHEN 650 MG RE SUPP: 650.0000 mg | Freq: Four times a day (QID) | RECTAL | Status: DC | PRN

## 2020-06-15 MED ORDER — FENTANYL CITRATE (PF) 100 MCG/2ML IJ SOLN: 50.0000 ug | Freq: Once | INTRAMUSCULAR | Status: AC

## 2020-06-15 MED ORDER — FENTANYL CITRATE (PF) 100 MCG/2ML IJ SOLN
INTRAMUSCULAR | Status: AC
  Filled 2020-06-15: qty 2

## 2020-06-15 MED ORDER — HALOPERIDOL LACTATE 2 MG/ML PO CONC
0.5000 mg | ORAL | Status: DC | PRN
  Filled 2020-06-15: qty 0.3

## 2020-06-15 MED ORDER — BIOTENE DRY MOUTH MT LIQD
15.0000 mL | Freq: Two times a day (BID) | OROMUCOSAL | Status: DC
  Administered 2020-06-15 – 2020-06-16 (×2): 15 mL via TOPICAL

## 2020-06-15 MED ORDER — ONDANSETRON HCL 4 MG/2ML IJ SOLN: 4.0000 mg | Freq: Four times a day (QID) | INTRAMUSCULAR | Status: DC | PRN

## 2020-06-15 MED ORDER — HALOPERIDOL LACTATE 2 MG/ML PO CONC
2.0000 mg | Freq: Four times a day (QID) | ORAL | Status: DC | PRN
  Filled 2020-06-15: qty 1

## 2020-06-15 MED ORDER — HALOPERIDOL LACTATE 5 MG/ML IJ SOLN: 2.0000 mg | Freq: Four times a day (QID) | INTRAMUSCULAR | Status: DC | PRN

## 2020-06-15 MED ORDER — GLYCOPYRROLATE 0.2 MG/ML IJ SOLN
0.2000 mg | INTRAMUSCULAR | Status: DC | PRN
  Administered 2020-06-16: 0.2 mg via INTRAVENOUS
  Filled 2020-06-15: qty 1

## 2020-06-15 MED ORDER — FENTANYL CITRATE (PF) 100 MCG/2ML IJ SOLN
100.0000 ug | Freq: Once | INTRAMUSCULAR | Status: AC
  Administered 2020-06-15: 100 ug via INTRAVENOUS

## 2020-06-15 MED ORDER — TETANUS-DIPHTH-ACELL PERTUSSIS 5-2.5-18.5 LF-MCG/0.5 IM SUSY
0.5000 mL | PREFILLED_SYRINGE | Freq: Once | INTRAMUSCULAR | Status: AC
  Administered 2020-06-15: 0.5 mL via INTRAMUSCULAR
  Filled 2020-06-15: qty 0.5

## 2020-06-15 MED ORDER — SODIUM CHLORIDE 0.9 % IV SOLN
1.0000 g | Freq: Once | INTRAVENOUS | Status: AC
  Administered 2020-06-15: 1 g via INTRAVENOUS
  Filled 2020-06-15: qty 10

## 2020-06-15 MED ORDER — IOHEXOL 350 MG/ML SOLN
100.0000 mL | Freq: Once | INTRAVENOUS | Status: AC | PRN
  Administered 2020-06-15: 100 mL via INTRAVENOUS

## 2020-06-15 MED ORDER — MORPHINE SULFATE (PF) 2 MG/ML IV SOLN
1.0000 mg | INTRAVENOUS | Status: DC | PRN
Start: 2020-06-15 — End: 2020-06-15

## 2020-06-15 MED ORDER — FENTANYL CITRATE (PF) 100 MCG/2ML IJ SOLN
100.0000 ug | Freq: Once | INTRAMUSCULAR | Status: AC
  Administered 2020-06-15: 100 ug via INTRAVENOUS
  Filled 2020-06-15: qty 2

## 2020-06-15 MED ORDER — FENTANYL CITRATE (PF) 100 MCG/2ML IJ SOLN
50.0000 ug | Freq: Once | INTRAMUSCULAR | Status: AC
  Administered 2020-06-15: 50 ug via INTRAVENOUS

## 2020-06-15 MED ORDER — SODIUM CHLORIDE 0.9 % IV SOLN
500.0000 mg | Freq: Once | INTRAVENOUS | Status: AC
  Administered 2020-06-15: 500 mg via INTRAVENOUS
  Filled 2020-06-15: qty 500

## 2020-06-15 MED ORDER — HALOPERIDOL 1 MG PO TABS: 0.5000 mg | ORAL_TABLET | ORAL | Status: DC | PRN

## 2020-06-15 MED ORDER — IOHEXOL 350 MG/ML SOLN
50.0000 mL | Freq: Once | INTRAVENOUS | Status: AC | PRN
  Administered 2020-06-15: 50 mL via INTRAVENOUS

## 2020-06-15 MED ORDER — GLYCOPYRROLATE 0.2 MG/ML IJ SOLN: 0.2000 mg | INTRAMUSCULAR | Status: DC | PRN

## 2020-06-15 MED ORDER — MORPHINE SULFATE (PF) 2 MG/ML IV SOLN
1.0000 mg | INTRAVENOUS | Status: DC | PRN
  Administered 2020-06-15 – 2020-06-16 (×3): 2 mg via INTRAVENOUS
  Filled 2020-06-15 (×3): qty 1

## 2020-06-15 MED ORDER — LORAZEPAM 2 MG/ML IJ SOLN: 1.0000 mg | INTRAMUSCULAR | Status: DC | PRN

## 2020-06-15 MED ORDER — BIOTENE DRY MOUTH MT LIQD: 15.0000 mL | OROMUCOSAL | Status: DC | PRN

## 2020-06-15 MED ORDER — FENTANYL CITRATE (PF) 100 MCG/2ML IJ SOLN
INTRAMUSCULAR | Status: AC
  Administered 2020-06-15: 50 ug via INTRAVENOUS
  Filled 2020-06-15: qty 2

## 2020-06-15 MED ORDER — SODIUM CHLORIDE 0.9 % IV BOLUS
1000.0000 mL | Freq: Once | INTRAVENOUS | Status: AC
  Administered 2020-06-15: 1000 mL via INTRAVENOUS

## 2020-06-15 MED ORDER — HALOPERIDOL LACTATE 5 MG/ML IJ SOLN: 0.5000 mg | INTRAMUSCULAR | Status: DC | PRN

## 2020-06-15 MED ORDER — GLYCOPYRROLATE 1 MG PO TABS
1.0000 mg | ORAL_TABLET | ORAL | Status: DC | PRN
  Filled 2020-06-15: qty 1

## 2020-06-15 MED ORDER — LORAZEPAM 2 MG/ML PO CONC: 1.0000 mg | ORAL | Status: DC | PRN

## 2020-06-15 MED ORDER — ACETAMINOPHEN 325 MG PO TABS: 650.0000 mg | ORAL_TABLET | Freq: Four times a day (QID) | ORAL | Status: DC | PRN

## 2020-06-15 MED ORDER — POLYVINYL ALCOHOL 1.4 % OP SOLN
1.0000 [drp] | Freq: Four times a day (QID) | OPHTHALMIC | Status: DC | PRN
  Filled 2020-06-15: qty 15

## 2020-06-15 MED ORDER — ONDANSETRON 4 MG PO TBDP: 4.0000 mg | ORAL_TABLET | Freq: Four times a day (QID) | ORAL | Status: DC | PRN

## 2020-06-15 NOTE — ED Notes (Signed)
Pt diaper changed

## 2020-06-15 NOTE — Progress Notes (Signed)
   06/15/20 0500  Clinical Encounter Type  Visited With Patient;Health care provider  Visit Type Spiritual support  Referral From Nurse  Consult/Referral To Chaplain  Spiritual Encounters  Spiritual Needs Grief support;Prayer  The chaplain responded to page from [facility]. The nurse Janett Billow) paged to have chaplain pray for the patient who was actively dying. The chaplain prayed for peace during his transition. The chaplain will follow up as needed.

## 2020-06-15 NOTE — ED Notes (Signed)
Oral care given chap stick applied to lips. Watch and 1 hearing aid given to daughter

## 2020-06-15 NOTE — Consult Note (Signed)
Neurology Consultation Reason for Consult: Encephalopathy Referring Physician: [place], a  CC: Does not speak  History is obtained from: Chart review  HPI: Adrian West is a 87 y.o. male who was in his normal state of health around 6 PM at dinner.  He was found by staff slumped over his walker having fallen.  He had a laceration on his head.  He was evaluated as a trauma, but due to the fact that he was persistently not talking neurology was consulted.  I took the patient for a stat head CTA which was negative for any large vessel occlusion.  LKW: 6 PM tpa given?: no, outside of window   ROS:  Unable to obtain due to altered mental status.   Past Medical History:  Diagnosis Date  . Anorexia 08/16/2014  . Bell's palsy   . Benign neoplasm of colon   . 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 Deaf   . Diaphragm paralysis   . Diverticulosis   . DNR (do not resuscitate) discussion 07/22/2014  . Dyspnea and respiratory abnormality 03/10/2014  . 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 Interstitial lung disease 10/15/13 Xray [facility]) 01/02/12 new subsegmental ATX on L (vs 09/03/11) Seeing Dr Theda Sers [facility]  Seen by Dr Corrin Parker, [facility],  02/2011. Component of thoracic height loss & alcohol related neuropathy questioned. No plication recommended  5/4- 10/14/2013 @ [facility] ; Dr  Ellis Parents.10/15/13 new focal consolidation mid anterior L lung:aspiration vs PNA . Underlying interstitial lung disease.10/15/13 Rx sent to [facility] for Doxycycline & Augmentin by Dr Jeannine Kitten ( not picked up as of 10/18/13).  Flew back to [facility]; seen @ 10/16/13 [facility] [place]. Levaquin X 5 days , Dr Evalee Mutton    . Esophageal stricture   . Gait disturbance   . GERD 02/14/2008   Qualifier: Diagnosis of   By: [name] MD, [name]    . Hiatal hernia   . History of radiation therapy 06/11/14-07/22/14   skin cancer of scalp/face  . HYPERTENSION 02/14/2008       . HYPOTHYROIDISM 02/14/2008  . Interstitial lung disease ([place]) 05/05/2014  . Loss of weight 07/22/2014  . Lung abnormality 11/25/2008   "nerve damage from heart surgery"  . Memory loss 12/31/2009   Qualifier: Diagnosis of  By: [name] MD, [name]    . MGUS (monoclonal gammopathy of unknown significance) 04/11/2013  . MITRAL REGURGITATION 02/14/2008   Qualifier: Diagnosis of  By: [name] MD, Cherlynn Kaiser MV surgery 11/2008, [name].complcated by paralysis of diaphragm     . 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 Neuropathy, peripheral   . Other and unspecified hyperlipidemia 02/24/2012  . Polycythemia, secondary 01/10/2014  . PVC's (premature ventricular contractions) 04/05/2011  . Radiation 06/11/14- 07/22/14   Right parotid bed, posterior auricular, right upper neck.  . 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 Skin cancer of scalp or skin of neck 05/21/2014  . SPINAL STENOSIS, LUMBAR 07/28/2010   Qualifier: Diagnosis of  By: [name] MD, [name], NS ( retired)    . Squamous carcinoma   . TIA (transient ischemic  attack) May 2012  . Tremor 2015  . Tremor, essential   . Vertigo 2005  . Vertigo 2005  . Weakness generalized 07/22/2014     Family History  Problem Relation Age of Onset  . Colon cancer Mother 98  . Heart attack Father 49  . Heart attack Maternal Grandmother 60  . Uterine cancer Paternal Grandmother   . Healthy Sister   . Stroke Neg Hx   . Diabetes Neg Hx      Social History:  reports that he has never smoked. He has never used smokeless tobacco. He reports that he does not drink alcohol and does not use  drugs.   Exam: Current vital signs: BP 117/88   Pulse (!) 110   Temp 98.1 F (36.7 C) (Temporal)   Resp (!) 25   Ht 5\' 10"  (1.778 m)   Wt 74 kg   SpO2 100%   BMI 23.41 kg/m  Vital signs in last 24 hours: Temp:  [98.1 F (36.7 C)] 98.1 F (36.7 C) (01/10 0020) Pulse Rate:  [85-110] 110 (01/10 0230) Resp:  [12-25] 25 (01/10 0230) BP: (99-126)/(69-88) 117/88 (01/10 0230) SpO2:  [94 %-100 %] 100 % (01/10 0230) Weight:  [74 kg] 74 kg (01/10 0032)   Physical Exam  Constitutional: Appears well-developed and well-nourished.  Psych: Does not answer questions Eyes: No scleral injection HENT: No OP obstruction, c-collar in place MSK: no joint deformities.  Cardiovascular: Normal rate and regular rhythm.  Respiratory: Effort normal, non-labored breathing GI: Soft.  No distension. There is no tenderness.  Skin: WDI  Neuro: Mental Status: Patient is lethargic, actively closes eyes when attempting to open them. No signs of neglect Cranial Nerves: II: He does not clearly blink to threat from either direction pupils are reactive bilaterally.   III,IV, VI: Left eye is slightly outwardly deviated compared to the right, he actively resists doll's eye maneuver VII: Facial movement with severe right facial weakness including the upper and lower face (old per previous notes) VIII: hearing is intact to voice Motor: He does not comply with formal testing, but does move all extremities spontaneously.  And withdrawals in all four extremities Sensory: He response to noxious stimulation in all four extremities Cerebellar: Does not perform   I have reviewed labs in epic and the results pertinent to this consultation are: Sodium 133  I have reviewed the images obtained: CT head-no acute findings, CTA-no LVO  Impression: 87 year old male with persistent encephalopathy after fall with blow to his head.  I am concerned that he could possibly have an ischemic stroke, but this not definite.   Certainly in someone 87 years old, a concussion could have a significant impact on his mental status and it could be that we are seeing result of that.  Also screen for other causes of encephalopathy.  Recommendations: 1) MRI brain 2) ammonia, TSH 3) routine EEG   Roland Rack, MD [facility] [phone]  If 7pm- 7am, please page neurology on call as listed in [facility].

## 2020-06-15 NOTE — ED Notes (Signed)
Daughter at bedside. MD paged to speak with daughter

## 2020-06-15 NOTE — ED Notes (Signed)
Spoke with patient's daughter Kellie Simmering and updated on patient's status. Per daughter patient remains DNR, would like all comfort measures done and a natural death to occur if imminent.

## 2020-06-15 NOTE — ED Notes (Signed)
Patient transported to MRI 

## 2020-06-15 NOTE — ED Notes (Signed)
Removed NRB and place on  @4  liters

## 2020-06-15 NOTE — ED Provider Notes (Addendum)
[facility] EMERGENCY DEPARTMENT Provider Note   CSN: [id number] Arrival date & time: 06/15/20  0015     History Chief Complaint  Patient presents with  . Fall  . Altered Mental Status    Adrian West is a 87 y.o. male.  The history is provided by the EMS personnel. The history is limited by the condition of the patient.  Fall This is a new problem. The problem occurs constantly. The problem has not changed since onset.Nothing aggravates the symptoms. Nothing relieves the symptoms. He has tried nothing for the symptoms. The treatment provided no relief.  Altered Mental Status Presenting symptoms: no combativeness   Severity:  Moderate Most recent episode:  Today Episode history:  Single Timing:  Constant Chronicity:  New Context: not recent infection   Associated symptoms: no agitation, no fever, no rash and no vomiting   Patient with unwitnessed fall at assisted living.  Non-verbal since fall.  Unknown blood thinners.       Past Medical History:  Diagnosis Date  . Anorexia 08/16/2014  . Bell's palsy   . Benign neoplasm of colon   . 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 Deaf   . Diaphragm paralysis   . Diverticulosis   . DNR (do not resuscitate) discussion 07/22/2014  . Dyspnea and respiratory abnormality 03/10/2014  . 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 Interstitial lung disease 10/15/13 Xray [facility]) 01/02/12 new subsegmental ATX on L (vs 09/03/11) Seeing Dr Theda Sers [facility]  Seen by Dr Corrin Parker, [facility],  02/2011. Component of thoracic height loss & alcohol related neuropathy questioned. No plication recommended  5/4- 10/14/2013 @ [facility] ; Dr  Ellis Parents.10/15/13 new focal consolidation mid anterior L lung:aspiration vs PNA . Underlying  interstitial lung disease.10/15/13 Rx sent to [place] for Doxycycline & Augmentin by Dr Jeannine Kitten ( not picked up as of 10/18/13).  Flew back to [facility]; seen @ 10/16/13 [facility] [place]. Levaquin X 5 days , Dr Evalee Mutton    . Esophageal stricture   . Gait disturbance   . GERD 02/14/2008   Qualifier: Diagnosis of  By: [name] MD, [name]    . Hiatal hernia   . History of radiation therapy 06/11/14-07/22/14   skin cancer of scalp/face  . HYPERTENSION 02/14/2008       . HYPOTHYROIDISM 02/14/2008  . Interstitial lung disease ([place]) 05/05/2014  . Loss of weight 07/22/2014  . Lung abnormality 11/25/2008   "nerve damage from heart surgery"  . Memory loss 12/31/2009   Qualifier: Diagnosis of  By: [name] MD, [name]    . MGUS (monoclonal gammopathy of unknown significance) 04/11/2013  . MITRAL REGURGITATION 02/14/2008   Qualifier: Diagnosis of  By: [name] MD, Cherlynn Kaiser MV surgery 11/2008, [name].complcated by paralysis of diaphragm     . 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 Neuropathy, peripheral   . Other and unspecified hyperlipidemia 02/24/2012  . Polycythemia, secondary 01/10/2014  . PVC's (premature ventricular contractions) 04/05/2011  . Radiation 06/11/14- 07/22/14   Right parotid bed, posterior auricular, right upper neck.  . 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 Skin cancer  of scalp or skin of neck 05/21/2014  . SPINAL STENOSIS, LUMBAR 07/28/2010   Qualifier: Diagnosis of  By: [name] MD, [name], NS ( retired)    . Squamous carcinoma   . TIA (transient ischemic attack) May 2012  . Tremor 2015  . Tremor, essential   . Vertigo 2005  . Vertigo 2005  . Weakness generalized 07/22/2014    Patient Active Problem List   Diagnosis Date Noted  . Right-sided Bell's palsy 02/21/2016   . Sinus bradycardia 02/21/2016  . Malnutrition of moderate degree 02/12/2016  . Fracture of femoral neck, left ([place]) 02/11/2016  . Closed left hip fracture ([place]) 02/11/2016  . Dizziness 12/31/2015  . Exertional dyspnea 12/31/2015  . Ectropion due to laxity of right eyelid 04/20/2015  . Obstructive chronic bronchitis with exacerbation ([place]) 11/13/2014  . Orthostatic hypotension 10/29/2014  . Protein-calorie malnutrition, severe ([place]) 10/28/2014  . Hypotension 10/27/2014  . SCCA (squamous cell carcinoma) of skin 10/27/2014  . Anorexia 08/16/2014  . DNR (do not resuscitate) discussion 07/22/2014  . Loss of weight 07/22/2014  . 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 Polycythemia, secondary 01/10/2014  . 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 MGUS (monoclonal gammopathy of unknown significance) 04/11/2013  . Other and unspecified hyperlipidemia 02/24/2012  . PVC's (premature ventricular contractions) 04/05/2011  . TIA (transient ischemic attack) 10/12/2010  . SPINAL STENOSIS, LUMBAR 07/28/2010  . Esophageal stricture 10/27/2008  . DYSPHAGIA 10/27/2008  . Hypothyroidism 02/14/2008  . MITRAL REGURGITATION 02/14/2008  . Essential hypertension 02/14/2008  . GERD 02/14/2008    Past Surgical History:  Procedure Laterality Date  . APPENDECTOMY    . BROW LIFT Right 04/25/2014   Midforehead  . CARDIAC CATHETERIZATION    . CARDIAC VALVE REPLACEMENT    . CATARACT EXTRACTION     [name]  . CHOLECYSTECTOMY  1998   [name]  . ECTROPION REPAIR Right 04/25/2014   Right tarsal strip  . ESOPHAGEAL DILATION      X3;[name]  . HIP PINNING,CANNULATED Left 02/13/2016   Procedure: CANNULATED LEFT HIP PINNING;  Surgeon: [name], MD;  Location: [facility];  Service: Orthopedics;  Laterality: Left;  . INGUINAL HERNIA REPAIR    . LAPAROSCOPIC ABLATION RENAL MASS  08/14/2009  . LUMBAR LAMINECTOMY    . 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 MOHS SURGERY  2012    ear  . NASAL SINUS SURGERY    . Nasolabial repair Right 04/25/2014   Right static sling using AlloDerm graft elevation of the nasolabial fold  . NECK DISSECTION Right 04/25/2014  . PAROTIDECTOMY Right 04/25/2014   Sacrifice of the facial nerve  . Partial auriculectomy Right 04/25/2014  . RIGHT HEART CATHETERIZATION N/A 03/03/2014   Procedure: RIGHT HEART CATH;  Surgeon: [name], MD;  Location: [facility] CATH LAB;  Service: Cardiovascular;  Laterality: N/A;  . TILT TABLE STUDY N/A 05/06/2013   Procedure: TILT TABLE STUDY;  Surgeon: [name], MD;  Location: [facility] CATH LAB;  Service: Cardiovascular;  Laterality: N/A;  . TONSILLECTOMY         Family History  Problem Relation Age of Onset  . Colon cancer Mother 96  . Heart attack Father 44  . Heart attack Maternal Grandmother 60  . Uterine cancer Paternal Grandmother   . Healthy Sister   . Stroke Neg Hx   .  Diabetes Neg Hx     Social History   Tobacco Use  . Smoking status: Never Smoker  . Smokeless tobacco: Never Used  Vaping Use  . Vaping Use: Never used  Substance Use Topics  . Alcohol use: No    Alcohol/week: 0.0 standard drinks    Comment: Drinks wine 3 per night  . Drug use: No    Home Medications Prior to Admission medications   Medication Sig Start Date End Date Taking? Authorizing Provider  amoxicillin (AMOXIL) 500 MG capsule Take 2,000 mg by mouth as needed (prior to dental procedure).    [provider]  aspirin EC 81 MG tablet Take 1 tablet (81 mg total) by mouth daily. Swallow whole. 03/04/20   Burtis Junes, NP  levothyroxine (SYNTHROID) 75 MCG tablet Take 75 mcg by mouth daily before breakfast.    [provider]  meclizine (ANTIVERT) 25 MG tablet Take 25 mg by mouth 3 (three) times daily as needed for dizziness.    [provider]  methylphenidate (RITALIN) 10 MG  tablet Take 10 mg by mouth daily.  03/22/18   [provider]  metoprolol succinate (TOPROL XL) 25 MG 24 hr tablet Take 1 tablet (25 mg total) daily by mouth. 04/11/17   Burtis Junes, NP  Polyethyl Glycol-Propyl Glycol (SYSTANE ULTRA OP) Apply 2 drops to eye 2 (two) times daily. Right eye only    [provider]  Vitamin D, Ergocalciferol, (DRISDOL) 50000 units CAPS capsule Take 50,000 Units by mouth every 7 (seven) days.    [provider]    Allergies    Plavix [clopidogrel]  Review of Systems   Review of Systems  Unable to perform ROS: Mental status change  Constitutional: Negative for fever.  Gastrointestinal: Negative for vomiting.  Skin: Negative for rash.  Psychiatric/Behavioral: Negative for agitation.    Physical Exam Updated Vital Signs BP 126/74   Pulse 88   Temp 98.1 F (36.7 C) (Temporal)   Resp 18   Ht 5\' 10"  (1.778 m)   Wt 74 kg   SpO2 98%   BMI 23.41 kg/m   Physical Exam Vitals and nursing note reviewed.  Constitutional:      Appearance: He is not diaphoretic.  HENT:     Head: Normocephalic.     Right Ear: Tympanic membrane normal.     Left Ear: Tympanic membrane normal.     Nose: Nose normal.  Eyes:     Conjunctiva/sclera: Conjunctivae normal.     Pupils: Pupils are equal, round, and reactive to light.  Neck:     Comments: c collar in place  Cardiovascular:     Rate and Rhythm: Tachycardia present. Rhythm irregular.     Pulses: Normal pulses.     Heart sounds: Normal heart sounds.  Pulmonary:     Breath sounds: No rales.  Abdominal:     General: Abdomen is flat. Bowel sounds are normal.     Palpations: Abdomen is soft.     Tenderness: There is no abdominal tenderness. There is no guarding or rebound.  Musculoskeletal:     Right lower leg: No edema.     Left lower leg: No edema.  Skin:    General: Skin is warm and dry.     Capillary Refill: Capillary refill takes less than 2 seconds.  Neurological:     Deep  Tendon Reflexes: Reflexes normal.  Psychiatric:     Comments: Unable      ED Results /  Procedures / Treatments   Labs (all labs ordered are listed, but only abnormal results are displayed) Results for orders placed or performed during the hospital encounter of 06/15/20  Resp Panel by RT-PCR (Flu A&B, Covid) Nasopharyngeal Swab   Specimen: Nasopharyngeal Swab; Nasopharyngeal(NP) swabs in vial transport medium  Result Value Ref Range   SARS Coronavirus 2 by RT PCR NEGATIVE NEGATIVE   Influenza A by PCR NEGATIVE NEGATIVE   Influenza B by PCR NEGATIVE NEGATIVE  Comprehensive metabolic panel  Result Value Ref Range   Sodium 132 (L) 135 - 145 mmol/L   Potassium 4.3 3.5 - 5.1 mmol/L   Chloride 95 (L) 98 - 111 mmol/L   CO2 25 22 - 32 mmol/L   Glucose, Bld 133 (H) 70 - 99 mg/dL   BUN 17 8 - 23 mg/dL   Creatinine, Ser 0.91 0.61 - 1.24 mg/dL   Calcium 9.0 8.9 - 10.3 mg/dL   Total Protein 5.7 (L) 6.5 - 8.1 g/dL   Albumin 3.6 3.5 - 5.0 g/dL   AST 28 15 - 41 U/L   ALT 16 0 - 44 U/L   Alkaline Phosphatase 63 38 - 126 U/L   Total Bilirubin 1.6 (H) 0.3 - 1.2 mg/dL   GFR, Estimated >60 >60 mL/min   Anion gap 12 5 - 15  CBC  Result Value Ref Range   WBC 17.6 (H) 4.0 - 10.5 K/uL   RBC 4.21 (L) 4.22 - 5.81 MIL/uL   Hemoglobin 13.4 13.0 - 17.0 g/dL   HCT 39.9 39.0 - 52.0 %   MCV 94.8 80.0 - 100.0 fL   MCH 31.8 26.0 - 34.0 pg   MCHC 33.6 30.0 - 36.0 g/dL   RDW 13.0 11.5 - 15.5 %   Platelets 203 150 - 400 K/uL   nRBC 0.0 0.0 - 0.2 %  Ethanol  Result Value Ref Range   Alcohol, Ethyl (B) <10 <10 mg/dL  Lactic acid, plasma  Result Value Ref Range   Lactic Acid, Venous 3.1 (HH) 0.5 - 1.9 mmol/L  Protime-INR  Result Value Ref Range   Prothrombin Time 15.0 11.4 - 15.2 seconds   INR 1.2 0.8 - 1.2  I-Stat Chem 8, ED  Result Value Ref Range   Sodium 133 (L) 135 - 145 mmol/L   Potassium 4.4 3.5 - 5.1 mmol/L   Chloride 94 (L) 98 - 111 mmol/L   BUN 21 8 - 23 mg/dL   Creatinine, Ser 0.70  0.61 - 1.24 mg/dL   Glucose, Bld 124 (H) 70 - 99 mg/dL   Calcium, Ion 1.15 1.15 - 1.40 mmol/L   TCO2 26 22 - 32 mmol/L   Hemoglobin 13.9 13.0 - 17.0 g/dL   HCT 41.0 39.0 - 52.0 %   CT HEAD WO CONTRAST  Result Date: 06/15/2020 CLINICAL DATA:  Fall EXAM: CT HEAD WITHOUT CONTRAST CT CERVICAL SPINE WITHOUT CONTRAST TECHNIQUE: Multidetector CT imaging of the head and cervical spine was performed following the standard protocol without intravenous contrast. Multiplanar CT image reconstructions of the cervical spine were also generated. COMPARISON:  Subsequent CT chest, abdomen and pelvis FINDINGS: CT HEAD FINDINGS Brain: Few scattered lacunar type infarcts in the basal ganglia and right cerebellar hemisphere. No evidence of acute infarction, hemorrhage, hydrocephalus, extra-axial collection, visible mass lesion or mass effect. Symmetric prominence of the ventricles, cisterns and sulci compatible with parenchymal volume loss. Patchy areas of white matter hypoattenuation are most compatible with chronic microvascular angiopathy. Vascular: Atherosclerotic calcification of the carotid siphons and  intradural vertebral arteries. No hyperdense vessel. Skull: Several sites of left parieto-occipital scalp swelling including a site of laceration and crescentic scalp hematoma measuring up to 8 mm in maximal thickness. Additional mild right occipital soft calcium Ling as well. No calvarial fracture or sutural diastasis. Sinuses/Orbits: Postsurgical changes and chronic hyperostotic features the bilateral maxillary sinuses. Subtotal opacification of the sphenoid sinuses with some mixed attenuation material including more high attenuation inspissated debris. Additional postsurgical change from prior right mastoidectomy. Surgical change noted along the right superolateral orbit. Bilateral lens extractions. Included orbital contents are otherwise unremarkable. Other: Postsurgical changes from prior right parotidectomy. CT  CERVICAL SPINE FINDINGS Alignment: Stabilization collar in place. Straightening and slight reversal of the upper cervical lordosis is similar to comparison CT imaging. Degenerative anterolisthesis C2 on 3 and C7 on T1 are similar to comparison imaging. No evidence of traumatic listhesis. No abnormally widened, perched or jumped facets. Normal alignment of the craniocervical and atlantoaxial articulations accounting for advanced atlantodental Skull base and vertebrae: The osseous structures appear diffusely demineralized which may limit detection of small or nondisplaced fractures. No acute skull base fracture. No vertebral body fracture or height loss. No suspicious lytic or blastic lesions. Extensive atlantodental and basion dens arthrosis with calcific pannus formation, nonspecific but can be seen with CPPD or rheumatoid arthropathy. Multilevel cervical spondylitic changes are present as well, better detailed below. Soft tissues and spinal canal: No pre or paravertebral fluid or swelling. No visible canal hematoma. Airways patent. Prior right cervical neck dissection and parotidectomy. Disc levels: Multilevel intervertebral disc height loss with spondylitic endplate changes. Essentially complete disc space effacement C3-C7 with spondylitic ridging, uncinate spurring, disc osteophyte complex formation hypertrophic facet arthropathy. Findings result in some chronic mild to moderate canal stenosis C3-C7. Multilevel mild-to-moderate foraminal narrowing is present as well with more moderate to severe narrowing bilaterally at C3-4. Upper chest: Comminuted fracture of the medial head left clavicle with anterior displacement of the fracture fragment, surrounding hematoma and soft tissue swelling. Edema and swelling extends across the base of the left neck and superficial to the sternocleidomastoid. Distention and synovitis of the left subacromial/subdeltoid bursa, partially included within imaging. Other: Cervical carotid  and vertebral artery atherosclerosis. Normal thyroid. IMPRESSION: 1. No acute intracranial abnormality. 2. Chronic microvascular ischemic white matter disease and parenchymal volume loss. 3. Scattered lacunar type infarcts in the basal ganglia and right cerebellar hemisphere. 4. Several sites of left parieto-occipital scalp swelling including a site of laceration and crescentic scalp hematoma measuring up to 8 mm in maximal thickness. No subjacent calvarial fracture. 5. Subtotal opacification of the sphenoid sinuses with some mixed attenuation material including more high attenuation inspissated debris, can be seen in the setting of chronic allergic fungal sinusitis. 6. No evidence of acute fracture or traumatic listhesis of the cervical spine. 7. Multilevel cervical spondylitic changes, as above. 8. Comminuted fracture of the medial head left clavicle with anterior displacement of the proximal third clavicle, surrounding hematoma and soft tissue swelling. Edema and swelling extends across the base of the left neck and superficial to the sternocleidomastoid. 9. Distention and synovitis of the left subacromial/subdeltoid bursa, partially included within imaging. Possibly chronic. 10. Prior right cervical neck dissection and parotidectomy. 11. Intracranial and cervical atherosclerosis. Electronically Signed   By: Lovena Le M.D.   On: 06/15/2020 02:12   CT CERVICAL SPINE WO CONTRAST  Result Date: 06/15/2020 CLINICAL DATA:  Fall EXAM: CT HEAD WITHOUT CONTRAST CT CERVICAL SPINE WITHOUT CONTRAST TECHNIQUE: Multidetector CT imaging of the head  and cervical spine was performed following the standard protocol without intravenous contrast. Multiplanar CT image reconstructions of the cervical spine were also generated. COMPARISON:  Subsequent CT chest, abdomen and pelvis FINDINGS: CT HEAD FINDINGS Brain: Few scattered lacunar type infarcts in the basal ganglia and right cerebellar hemisphere. No evidence of acute  infarction, hemorrhage, hydrocephalus, extra-axial collection, visible mass lesion or mass effect. Symmetric prominence of the ventricles, cisterns and sulci compatible with parenchymal volume loss. Patchy areas of white matter hypoattenuation are most compatible with chronic microvascular angiopathy. Vascular: Atherosclerotic calcification of the carotid siphons and intradural vertebral arteries. No hyperdense vessel. Skull: Several sites of left parieto-occipital scalp swelling including a site of laceration and crescentic scalp hematoma measuring up to 8 mm in maximal thickness. Additional mild right occipital soft calcium Ling as well. No calvarial fracture or sutural diastasis. Sinuses/Orbits: Postsurgical changes and chronic hyperostotic features the bilateral maxillary sinuses. Subtotal opacification of the sphenoid sinuses with some mixed attenuation material including more high attenuation inspissated debris. Additional postsurgical change from prior right mastoidectomy. Surgical change noted along the right superolateral orbit. Bilateral lens extractions. Included orbital contents are otherwise unremarkable. Other: Postsurgical changes from prior right parotidectomy. CT CERVICAL SPINE FINDINGS Alignment: Stabilization collar in place. Straightening and slight reversal of the upper cervical lordosis is similar to comparison CT imaging. Degenerative anterolisthesis C2 on 3 and C7 on T1 are similar to comparison imaging. No evidence of traumatic listhesis. No abnormally widened, perched or jumped facets. Normal alignment of the craniocervical and atlantoaxial articulations accounting for advanced atlantodental Skull base and vertebrae: The osseous structures appear diffusely demineralized which may limit detection of small or nondisplaced fractures. No acute skull base fracture. No vertebral body fracture or height loss. No suspicious lytic or blastic lesions. Extensive atlantodental and basion dens arthrosis  with calcific pannus formation, nonspecific but can be seen with CPPD or rheumatoid arthropathy. Multilevel cervical spondylitic changes are present as well, better detailed below. Soft tissues and spinal canal: No pre or paravertebral fluid or swelling. No visible canal hematoma. Airways patent. Prior right cervical neck dissection and parotidectomy. Disc levels: Multilevel intervertebral disc height loss with spondylitic endplate changes. Essentially complete disc space effacement C3-C7 with spondylitic ridging, uncinate spurring, disc osteophyte complex formation hypertrophic facet arthropathy. Findings result in some chronic mild to moderate canal stenosis C3-C7. Multilevel mild-to-moderate foraminal narrowing is present as well with more moderate to severe narrowing bilaterally at C3-4. Upper chest: Comminuted fracture of the medial head left clavicle with anterior displacement of the fracture fragment, surrounding hematoma and soft tissue swelling. Edema and swelling extends across the base of the left neck and superficial to the sternocleidomastoid. Distention and synovitis of the left subacromial/subdeltoid bursa, partially included within imaging. Other: Cervical carotid and vertebral artery atherosclerosis. Normal thyroid. IMPRESSION: 1. No acute intracranial abnormality. 2. Chronic microvascular ischemic white matter disease and parenchymal volume loss. 3. Scattered lacunar type infarcts in the basal ganglia and right cerebellar hemisphere. 4. Several sites of left parieto-occipital scalp swelling including a site of laceration and crescentic scalp hematoma measuring up to 8 mm in maximal thickness. No subjacent calvarial fracture. 5. Subtotal opacification of the sphenoid sinuses with some mixed attenuation material including more high attenuation inspissated debris, can be seen in the setting of chronic allergic fungal sinusitis. 6. No evidence of acute fracture or traumatic listhesis of the cervical  spine. 7. Multilevel cervical spondylitic changes, as above. 8. Comminuted fracture of the medial head left clavicle with anterior displacement of the  proximal third clavicle, surrounding hematoma and soft tissue swelling. Edema and swelling extends across the base of the left neck and superficial to the sternocleidomastoid. 9. Distention and synovitis of the left subacromial/subdeltoid bursa, partially included within imaging. Possibly chronic. 10. Prior right cervical neck dissection and parotidectomy. 11. Intracranial and cervical atherosclerosis. Electronically Signed   By: Lovena Le M.D.   On: 06/15/2020 02:12   DG Pelvis Portable  Result Date: 06/15/2020 CLINICAL DATA:  Unwitnessed fall EXAM: PORTABLE PELVIS 1-2 VIEWS COMPARISON:  02/11/2016 FINDINGS: Pins are seen within the proximal left femur related to remote injury. No acute fracture, subluxation or dislocation. Degenerative changes in the hips bilaterally and visualized lower lumbar spine. IMPRESSION: No acute bony abnormality. Electronically Signed   By: Rolm Baptise M.D.   On: 06/15/2020 00:53   CT CHEST ABDOMEN PELVIS W CONTRAST  Result Date: 06/15/2020 CLINICAL DATA:  Acute pain due to trauma EXAM: CT CHEST, ABDOMEN, AND PELVIS WITH CONTRAST TECHNIQUE: Multidetector CT imaging of the chest, abdomen and pelvis was performed following the standard protocol during bolus administration of intravenous contrast. CONTRAST:  100 mL of Isovue 370. COMPARISON:  03/10/2014 CT chest.  CT of the pelvis dated 02/23/2015 FINDINGS: CT CHEST FINDINGS Cardiovascular: The heart size is enlarged. Aortic calcifications are noted. There is no evidence for thoracic aortic aneurysm. No evidence for thoracic aortic dissection. The pulmonary arteries are dilated. Chronic pulmonary emboli are noted involving the right lower lobe pulmonary artery (axial series 3, image 34). No definite evidence for an acute pulmonary embolism. There is no significant pericardial  effusion. Coronary artery calcifications are noted. Mediastinum/Nodes: -- No mediastinal lymphadenopathy. -- No hilar lymphadenopathy. -- No axillary lymphadenopathy. -- No supraclavicular lymphadenopathy. -- Normal thyroid gland where visualized. -  Unremarkable esophagus. Lungs/Pleura: There is atelectasis at the lung bases. There are chronic areas of fibrosis at the lung bases bilaterally. There is no large pleural effusion. There is a pulmonary nodule at the left lung base measuring 1.3 cm (axial series 5, image 119). This appears to have slightly increased in size since 2016. There is no pneumothorax. Musculoskeletal: There is an acute, somewhat displaced fracture of the medial left clavicle near the sternoclavicular joint. There is surrounding soft tissue swelling. There are end-stage degenerative changes of the bilateral glenohumeral joints. There is an apparent old healed fracture of the sternal body. CT ABDOMEN PELVIS FINDINGS Hepatobiliary: The liver is normal. Status post cholecystectomy.There is no biliary ductal dilation. Pancreas: Normal contours without ductal dilatation. No peripancreatic fluid collection. Spleen: Unremarkable. Adrenals/Urinary Tract: --Adrenal glands: Unremarkable. --Right kidney/ureter: No hydronephrosis or radiopaque kidney stones. --Left kidney/ureter: There is a relatively stable calcified exophytic nodule arising from the lower pole of the left kidney. --Urinary bladder: Unremarkable. Stomach/Bowel: --Stomach/Duodenum: No hiatal hernia or other gastric abnormality. Normal duodenal course and caliber. --Small bowel: Unremarkable. --Colon: Rectosigmoid diverticulosis without acute inflammation. --Appendix: Normal. Vascular/Lymphatic: Atherosclerotic calcification is present within the non-aneurysmal abdominal aorta, without hemodynamically significant stenosis. --No retroperitoneal lymphadenopathy. --No mesenteric lymphadenopathy. --No pelvic or inguinal lymphadenopathy.  Reproductive: There is a large left-sided hydrocele. Other: No ascites or free air. The abdominal wall is normal. Musculoskeletal. There are degenerative changes throughout the visualized lumbar spine. The patient is status post prior left hip percutaneous pinning. IMPRESSION: 1. Acute, somewhat displaced fracture of the medial left clavicle near the sternoclavicular joint. There is surrounding soft tissue swelling. No pneumothorax. 2. Chronic pulmonary emboli involving the right lower lobe pulmonary artery. No definite evidence for an acute pulmonary embolism.  The central pulmonary arteries are dilated suggestive of pulmonary artery hypertension. 3. Cardiomegaly and coronary artery disease. 4. Chronic areas of fibrosis at the lung bases bilaterally. 5. There is a 1.3 cm pulmonary nodule in the left lower lobe which appears to have increased since prior studies. Consider one of the following in 3 months for both low-risk and high-risk individuals: (a) repeat chest CT, (b) follow-up PET-CT, or (c) tissue sampling. This recommendation follows the consensus statement: Guidelines for Management of Incidental Pulmonary Nodules Detected on CT Images: From the Fleischner Society 2017; Radiology 2017; 284:228-243. Aortic Atherosclerosis (ICD10-I70.0). Electronically Signed   By: Constance Holster M.D.   On: 06/15/2020 01:55   DG Chest Port 1 View  Result Date: 06/15/2020 CLINICAL DATA:  Unwitnessed fall, unresponsive EXAM: PORTABLE CHEST 1 VIEW COMPARISON:  02/11/2016 FINDINGS: Bibasilar airspace opacities, favor atelectasis. Heart is borderline in size. No visible effusions or pneumothorax. No acute bony abnormality. IMPRESSION: Bibasilar opacities, favor atelectasis Electronically Signed   By: Rolm Baptise M.D.   On: 06/15/2020 00:52    Radiology No results found.  Procedures .Marland KitchenLaceration Repair  Date/Time: 06/15/2020 5:15 AM Performed by: Veatrice Kells, MD Authorized by: Veatrice Kells, MD   Consent:     Consent obtained:  Emergent situation Universal protocol:    Patient identity confirmed:  Arm band Anesthesia:    Anesthesia method:  Topical application   Topical anesthetic:  LET Laceration details:    Location:  Scalp   Scalp location:  Occipital   Length (cm):  1.5   Depth (mm):  1 Pre-procedure details:    Preparation:  Patient was prepped and draped in usual sterile fashion Exploration:    Hemostasis achieved with:  Direct pressure and LET   Wound extent: no areolar tissue violation noted   Treatment:    Area cleansed with:  Chlorhexidine   Amount of cleaning:  Extensive   Irrigation solution:  Sterile saline   Debridement:  None   Undermining:  None Skin repair:    Repair method:  Staples   Number of staples:  6 Approximation:    Approximation:  Close Repair type:    Repair type:  Simple Post-procedure details:    Dressing:  Sterile dressing   Procedure completion:  Tolerated well, no immediate complications   (including critical care time)  Medications Ordered in ED Medications  cefTRIAXone (ROCEPHIN) 1 g in sodium chloride 0.9 % 100 mL IVPB (has no administration in time range)  azithromycin (ZITHROMAX) 500 mg in sodium chloride 0.9 % 250 mL IVPB (500 mg Intravenous New Bag/Given 06/15/20 0213)  Tdap (BOOSTRIX) injection 0.5 mL (has no administration in time range)  fentaNYL (SUBLIMAZE) injection 100 mcg (100 mcg Intravenous Given 06/15/20 0108)  iohexol (OMNIPAQUE) 350 MG/ML injection 100 mL (100 mLs Intravenous Contrast Given 06/15/20 0140)    ED Course  I have reviewed the triage vital signs and the nursing notes.  Pertinent labs & imaging results that were available during my care of the patient were reviewed by me and considered in my medical decision making (see chart for details).   230 Seen in consult by Dr. Leonel Ramsay,  CTA head and neck   237: case d/w [name].  No emergent intervention. Will see in consult   Final Clinical Impression(s) / ED  Diagnoses Final diagnoses:  Fall   Blood pressure decreased, patient is a DNR/DNI but IVF given.     Admit to medicine for CVA    Vikki Gains, MD 06/15/20 [phone]  Taariq Leitz, MD 06/15/20 [phone]

## 2020-06-15 NOTE — ED Notes (Signed)
Patient repositioned in bed , daughters remain at bedside.

## 2020-06-15 NOTE — ED Notes (Signed)
Patient is comfort care. 

## 2020-06-15 NOTE — ED Notes (Signed)
Lunch Tray Ordered @ 1214. 

## 2020-06-15 NOTE — Progress Notes (Signed)
Pt was seen earlier today by Dr. Leonides Schanz. Now noted that pt is in full comfort care measures which confirmed by Elmer Picker, our palliative care NP. We will sign off at this time. Please call with any questions.   Rosalin Hawking, MD PhD Stroke Neurology 06/15/2020 12:22 PM

## 2020-06-15 NOTE — Progress Notes (Signed)
Nurse Claiborne Billings) called to ask Chaplain to visit with daughter and patient in Trauma A. Chaplain visited with daughter and patient (unconscious) and offered presence, prayer and emotional support. Other daughter is on the way and third daughter is trying to get here from [employer]. Chaplain available when needed.    06/15/20 1100  Clinical Encounter Type  Visited With Patient and family together  Visit Type Spiritual support  Referral From Nurse  Consult/Referral To Chaplain  Spiritual Encounters  Spiritual Needs Grief support;Prayer  Stress Factors  Patient Stress Factors Health changes  Family Stress Factors Loss

## 2020-06-15 NOTE — ED Triage Notes (Signed)
Pt BIB [facility] for eval of unwitnessed fall at nursing home. Pt reportedly was found down slumped over a walker. Pt is non verbal, Staff at facility states normally independent, A&Ox4 and conversive at baseline. Lac to back of head, continues to ooze. Unknown thinners. Chest deformity to L chest wall w/ crepitus. Pt does track to voice

## 2020-06-15 NOTE — ED Notes (Signed)
Daughter called and aware of current status

## 2020-06-15 NOTE — H&P (Signed)
History and Physical    Adrian West UJW:[id number] DOB: 1927/10/07 DOA: 06/15/2020  PCP: [name], MD Consultants:  [name] - ENT; [name] - cardiology Patient coming from:  [place]; NOK: Daughter, [name], [phone]  Chief Complaint: Fall  HPI: Adrian West is a 87 y.o. male with medical history significant of salivary gland neoplasm; HLD; MGUS; dementia; afib; ILD; hypothyroidism; and HTN presenting with a fall.  His daughter was notified this AM of an unwitnessed fall at his facility.  He was found slumped over his walker.  Has a lac to the back of his head.  He was tracking to voice at time of presentation, but has been obtunded with response only to painful stimuli since the time of my initial evaluation.    ED Course:  Carryover, per [name]:  87 yo M with stroke, had fall, has clavicle fracture. [name] going to see in AM from that perspective.  Did get large amount of pain meds due to clavicle fracture. EDP going to repair head lac when he gets back from MRI. [name] consulted for neuro. DNR/DNI per family. Also with hypotension, appears to be actively dying.     Review of Systems: Unable to perform   Ambulatory Status:  Ambulates with a walker  COVID Vaccine Status:   Complete  Past Medical History:  Diagnosis Date  . Bell's palsy   . Benign neoplasm of colon   . Deaf   . Diverticulosis   . DNR (do not resuscitate) discussion 07/22/2014  . 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 Interstitial lung disease 10/15/13 Xray [facility]) 01/02/12 new subsegmental ATX on L (vs 09/03/11) Seeing Dr Theda [facility]  Seen by Dr Corrin Parker, [facility],  02/2011. Component of thoracic height loss & alcohol related neuropathy questioned. No plication recommended  5/4-  10/14/2013 @ [facility] ; Dr  Ellis Parents.10/15/13 new focal consolidation mid anterior L lung:aspiration vs PNA . Underlying interstitial lung disease.10/15/13 Rx sent to [place] for Doxycycline & Augmentin by Dr Jeannine Kitten ( not picked up as of 10/18/13).  Flew back to [facility]; seen @ 10/16/13 [facility], [place]. Levaquin X 5 days , Dr Evalee Mutton    . Esophageal stricture   . Gait disturbance   . GERD 02/14/2008   Qualifier: Diagnosis of  By: [name] MD, [name]    . Hiatal hernia   . HYPERTENSION 02/14/2008       . HYPOTHYROIDISM 02/14/2008  . Interstitial lung disease ([place]) 05/05/2014  . Memory loss 12/31/2009   Qualifier: Diagnosis of  By: [name] MD, [name]    . MGUS (monoclonal gammopathy of unknown significance) 04/11/2013  . MITRAL REGURGITATION 02/14/2008   Qualifier: Diagnosis of  By: [name] MD, Cherlynn Kaiser MV surgery 11/2008, [name].complcated by paralysis of diaphragm     . Neuropathy, peripheral   . Other and unspecified hyperlipidemia 02/24/2012  . Polycythemia, secondary 01/10/2014  . PVC's (premature ventricular contractions) 04/05/2011  . Radiation 06/11/14- 07/22/14   Right parotid bed, posterior auricular, right upper neck.  . Salivary gland malignant neoplasm ([name]) 03/10/2014  . 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 SPINAL STENOSIS, LUMBAR 07/28/2010   Qualifier: Diagnosis of  By: [name] MD, [name], NS ( retired)    . Squamous carcinoma   .  TIA (transient ischemic attack) May 2012  . Tremor, essential   . Vertigo 2005    Past Surgical History:  Procedure Laterality Date  . APPENDECTOMY    . BROW LIFT Right 04/25/2014   Midforehead  . CARDIAC CATHETERIZATION    . CARDIAC VALVE REPLACEMENT    . CATARACT EXTRACTION     [name]  . CHOLECYSTECTOMY  1998   [name]  . ECTROPION REPAIR Right 04/25/2014   Right tarsal strip  . ESOPHAGEAL DILATION      X3;[name]  . HIP PINNING,CANNULATED  Left 02/13/2016   Procedure: CANNULATED LEFT HIP PINNING;  Surgeon: [name], MD;  Location: [place];  Service: Orthopedics;  Laterality: Left;  . INGUINAL HERNIA REPAIR    . LAPAROSCOPIC ABLATION RENAL MASS  08/14/2009  . LUMBAR LAMINECTOMY    . 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 MOHS SURGERY  2012    ear  . NASAL SINUS SURGERY    . Nasolabial repair Right 04/25/2014   Right static sling using AlloDerm graft elevation of the nasolabial fold  . NECK DISSECTION Right 04/25/2014  . PAROTIDECTOMY Right 04/25/2014   Sacrifice of the facial nerve  . Partial auriculectomy Right 04/25/2014  . RIGHT HEART CATHETERIZATION N/A 03/03/2014   Procedure: RIGHT HEART CATH;  Surgeon: [name], MD;  Location: [facility] CATH LAB;  Service: Cardiovascular;  Laterality: N/A;  . TILT TABLE STUDY N/A 05/06/2013   Procedure: TILT TABLE STUDY;  Surgeon: [name], MD;  Location: [facility] CATH LAB;  Service: Cardiovascular;  Laterality: N/A;  . TONSILLECTOMY      Social History   Socioeconomic History  . Marital status: Married    Spouse name: Not on file  . Number of children: 3  . Years of education: Not on file  . Highest education level: Not on file  Occupational History  . Occupation: retired Equities trader   Tobacco Use  . Smoking status: Never Smoker  . Smokeless tobacco: Never Used  Vaping Use  . Vaping Use: Never used  Substance and Sexual Activity  . Alcohol use: No    Alcohol/week: 0.0 standard drinks    Comment: Drinks wine 3 per night  . Drug use: No  . Sexual activity: Not Currently  Other Topics Concern  . 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   Married x 53 years.  They have three daughters.   Social Determinants of Health   Financial Resource Strain: Not on file  Food Insecurity: Not on file  Transportation Needs: Not on file   Physical Activity: Not on file  Stress: Not on file  Social Connections: Not on file  Intimate Partner Violence: Not on file    Allergies  Allergen Reactions  . Plavix [Clopidogrel] Other (See Comments)    Cannot take med    Family History  Problem Relation Age of Onset  . Colon cancer Mother 70  . Heart attack Father 17  . Heart attack Maternal Grandmother 60  . Uterine cancer Paternal Grandmother   . Healthy Sister   . Stroke Neg Hx   . Diabetes Neg Hx     Prior to Admission medications   Medication Sig Start Date End Date Taking? Authorizing Provider  amoxicillin (AMOXIL) 500 MG capsule Take 2,000 mg by mouth as needed (prior to dental procedure).  Yes [provider]  aspirin EC 81 MG tablet Take 1 tablet (81 mg total) by mouth daily. Swallow whole. 03/04/20  Yes Burtis Junes, NP  levothyroxine (SYNTHROID) 75 MCG tablet Take 75 mcg by mouth daily before breakfast.   Yes [provider]  meclizine (ANTIVERT) 25 MG tablet Take 25 mg by mouth 3 (three) times daily as needed for dizziness.   Yes [provider]  methylphenidate (RITALIN) 10 MG tablet Take 10 mg by mouth daily.  03/22/18  Yes [provider]  metoprolol succinate (TOPROL XL) 25 MG 24 hr tablet Take 1 tablet (25 mg total) daily by mouth. 04/11/17  Yes Burtis Junes, NP  Polyethyl Glycol-Propyl Glycol (SYSTANE ULTRA OP) Apply 2 drops to eye 2 (two) times daily. Right eye only   Yes [provider]  Vitamin D, Ergocalciferol, (DRISDOL) 50000 units CAPS capsule Take 50,000 Units by mouth every Monday.   Yes [provider]  White Petrolatum-Mineral Oil (REFRESH LACRI-LUBE) OINT Place 1 Dose into the right eye at bedtime.   Yes [provider]    Physical Exam: Vitals:   06/15/20 1015 06/15/20 1100 06/15/20 1230 06/15/20 1315  BP: (!) 75/53 95/61 113/84 107/74  Pulse: 73 69 87 79  Resp: 18 20 18 18   Temp:      TempSrc:      SpO2: 100% 100%  100% 100%  Weight:      Height:         . General:  Appears calm and comfortable, obtunded; L posterior scalp lac s/p staple repair . Eyes:   normal lids; R eyelid is closed, which daughter reports has not been possible in many years . ENT:  R ear is surgically altered and half missing with remote R facial surgical scarring; lips dry and only breathing through an opening on the R mouth . Neck:  no LAD, masses or thyromegaly; minimal anterior neck ecchymosis at the time of initial exam but currently it is very ecchymotic.  Marked left upper chest ecchymoses and lack of normal architecture due to clavicular fracture . Cardiovascular:  RR with bradycardia, no m/r/g. No LE edema.  Marland Kitchen Respiratory:   CTA bilaterally with no wheezes/rales/rhonchi.  Normal respiratory effort. . Abdomen:  soft, NT, ND, NABS . Skin:  Scattered ecchymoses, excoriations . Musculoskeletal:  No apparent movement of the R side but the left hand and foot do have some movement, particularly with apparently painful stimuli . Psychiatric:  Obtunded, minimally responsive to painful stimuli only . Neurologic:  Unable to perform    Radiological Exams on Admission: Independently reviewed - see discussion in A/P where applicable  CT Angio Head W or Wo Contrast  Result Date: 06/15/2020 CLINICAL DATA:  Initial evaluation for acute aphasia. EXAM: CT ANGIOGRAPHY HEAD AND NECK TECHNIQUE: Multidetector CT imaging of the head and neck was performed using the standard protocol during bolus administration of intravenous contrast. Multiplanar CT image reconstructions and MIPs were obtained to evaluate the vascular anatomy. Carotid stenosis measurements (when applicable) are obtained utilizing NASCET criteria, using the distal internal carotid diameter as the denominator. CONTRAST:  56mL OMNIPAQUE IOHEXOL 350 MG/ML SOLN COMPARISON:  Comparison made with prior CTs from earlier the same day. FINDINGS: CTA NECK FINDINGS Aortic arch: Visualized  aortic arch of normal caliber with normal 3 vessel morphology. Moderate atheromatous change about the arch and origin of the great vessels without hemodynamically significant stenosis. Subclavian arteries patent bilaterally. Right carotid system: Right CCA patent from its  origin to the bifurcation without stenosis. No significant atheromatous narrowing about the right bifurcation. Diffuse atheromatous irregularity throughout the right ICA without significant stenosis, dissection or occlusion. Left carotid system: Left CCA patent from its origin to the bifurcation without stenosis. Eccentric calcified plaque at the left bifurcation/proximal left ICA without hemodynamically significant stenosis. Left ICA irregular but widely patent distally to the skull base without stenosis, dissection or occlusion. Vertebral arteries: Both vertebral arteries arise from subclavian arteries. No proximal subclavian artery stenosis. Left vertebral artery dominant. Atheromatous plaque about the origin of the left vertebral artery with estimated 50-70% stenosis. Dominant left vertebral artery otherwise patent within the neck. Right vertebral artery diffusely hypoplastic and is largely occluded within the neck. Skeleton: Mildly displaced comminuted fracture of the left clavicular head again noted. Other neck: Scattered soft tissue hematoma related to the left clavicular fracture present within the lower left neck. No visible active contrast extravasation. Sequelae of prior right-sided neck dissection. No visible mass or adenopathy. Upper chest: Better evaluated on concomitant CT of the chest. Review of the MIP images confirms the above findings CTA HEAD FINDINGS Anterior circulation: Petrous segments widely patent bilaterally. Atheromatous plaque within the carotid siphons with moderate stenosis at the para clinoid ICAs bilaterally. A1 segments patent. Right A1 hypoplastic. Normal anterior communicating artery complex. Both ACAs are  diffusely irregular but remain perfused and patent to their distal aspects. No M1 stenosis or occlusion. Normal MCA bifurcations. Distal MCA branches well perfused and symmetric. Diffuse small vessel atheromatous irregularity. Posterior circulation: Focal plaque within the proximal dominant left V4 segment with short-segment mild stenosis. Left vertebral otherwise widely patent. Patent left PICA. Hypoplastic right vertebral artery largely occluded at the skull base. Faint retrograde filling of the distal right V4 segment with perfusion of the right PICA. Basilar widely patent to its distal aspect. Superior cerebellar arteries patent bilaterally. Left PCA primarily supplied via the basilar. Right PCA supplied via the basilar as well as a prominent right posterior communicating artery. Both PCAs are diffusely irregular but remain well perfused or distal aspects. Venous sinuses: Grossly patent allowing for timing the contrast bolus. Anatomic variants: None significant.  No aneurysm. Review of the MIP images confirms the above findings IMPRESSION: 1. Negative CTA for emergent large vessel occlusion. 2. Hypoplastic right vertebral artery largely occluded within the neck. Retrograde filling of the distal right V4 segment with perfusion of the right PICA. 3. 50-70% stenosis at the origin of the dominant left vertebral artery. Left vertebral artery otherwise widely patent within the neck. 4. Moderate short-segment atheromatous stenoses at the para clinoid ICAs bilaterally. 5. Mildly displaced comminuted left clavicular head fracture with associated soft tissue hematoma. No visible active contrast extravasation. 6. Sequelae of prior right-sided neck dissection. Electronically Signed   By: Jeannine Boga M.D.   On: 06/15/2020 04:09   CT HEAD WO CONTRAST  Result Date: 06/15/2020 CLINICAL DATA:  Fall EXAM: CT HEAD WITHOUT CONTRAST CT CERVICAL SPINE WITHOUT CONTRAST TECHNIQUE: Multidetector CT imaging of the head and  cervical spine was performed following the standard protocol without intravenous contrast. Multiplanar CT image reconstructions of the cervical spine were also generated. COMPARISON:  Subsequent CT chest, abdomen and pelvis FINDINGS: CT HEAD FINDINGS Brain: Few scattered lacunar type infarcts in the basal ganglia and right cerebellar hemisphere. No evidence of acute infarction, hemorrhage, hydrocephalus, extra-axial collection, visible mass lesion or mass effect. Symmetric prominence of the ventricles, cisterns and sulci compatible with parenchymal volume loss. Patchy areas of white matter hypoattenuation are most  compatible with chronic microvascular angiopathy. Vascular: Atherosclerotic calcification of the carotid siphons and intradural vertebral arteries. No hyperdense vessel. Skull: Several sites of left parieto-occipital scalp swelling including a site of laceration and crescentic scalp hematoma measuring up to 8 mm in maximal thickness. Additional mild right occipital soft calcium Ling as well. No calvarial fracture or sutural diastasis. Sinuses/Orbits: Postsurgical changes and chronic hyperostotic features the bilateral maxillary sinuses. Subtotal opacification of the sphenoid sinuses with some mixed attenuation material including more high attenuation inspissated debris. Additional postsurgical change from prior right mastoidectomy. Surgical change noted along the right superolateral orbit. Bilateral lens extractions. Included orbital contents are otherwise unremarkable. Other: Postsurgical changes from prior right parotidectomy. CT CERVICAL SPINE FINDINGS Alignment: Stabilization collar in place. Straightening and slight reversal of the upper cervical lordosis is similar to comparison CT imaging. Degenerative anterolisthesis C2 on 3 and C7 on T1 are similar to comparison imaging. No evidence of traumatic listhesis. No abnormally widened, perched or jumped facets. Normal alignment of the craniocervical and  atlantoaxial articulations accounting for advanced atlantodental Skull base and vertebrae: The osseous structures appear diffusely demineralized which may limit detection of small or nondisplaced fractures. No acute skull base fracture. No vertebral body fracture or height loss. No suspicious lytic or blastic lesions. Extensive atlantodental and basion dens arthrosis with calcific pannus formation, nonspecific but can be seen with CPPD or rheumatoid arthropathy. Multilevel cervical spondylitic changes are present as well, better detailed below. Soft tissues and spinal canal: No pre or paravertebral fluid or swelling. No visible canal hematoma. Airways patent. Prior right cervical neck dissection and parotidectomy. Disc levels: Multilevel intervertebral disc height loss with spondylitic endplate changes. Essentially complete disc space effacement C3-C7 with spondylitic ridging, uncinate spurring, disc osteophyte complex formation hypertrophic facet arthropathy. Findings result in some chronic mild to moderate canal stenosis C3-C7. Multilevel mild-to-moderate foraminal narrowing is present as well with more moderate to severe narrowing bilaterally at C3-4. Upper chest: Comminuted fracture of the medial head left clavicle with anterior displacement of the fracture fragment, surrounding hematoma and soft tissue swelling. Edema and swelling extends across the base of the left neck and superficial to the sternocleidomastoid. Distention and synovitis of the left subacromial/subdeltoid bursa, partially included within imaging. Other: Cervical carotid and vertebral artery atherosclerosis. Normal thyroid. IMPRESSION: 1. No acute intracranial abnormality. 2. Chronic microvascular ischemic white matter disease and parenchymal volume loss. 3. Scattered lacunar type infarcts in the basal ganglia and right cerebellar hemisphere. 4. Several sites of left parieto-occipital scalp swelling including a site of laceration and crescentic  scalp hematoma measuring up to 8 mm in maximal thickness. No subjacent calvarial fracture. 5. Subtotal opacification of the sphenoid sinuses with some mixed attenuation material including more high attenuation inspissated debris, can be seen in the setting of chronic allergic fungal sinusitis. 6. No evidence of acute fracture or traumatic listhesis of the cervical spine. 7. Multilevel cervical spondylitic changes, as above. 8. Comminuted fracture of the medial head left clavicle with anterior displacement of the proximal third clavicle, surrounding hematoma and soft tissue swelling. Edema and swelling extends across the base of the left neck and superficial to the sternocleidomastoid. 9. Distention and synovitis of the left subacromial/subdeltoid bursa, partially included within imaging. Possibly chronic. 10. Prior right cervical neck dissection and parotidectomy. 11. Intracranial and cervical atherosclerosis. Electronically Signed   By: Lovena Le M.D.   On: 06/15/2020 02:12   CT Angio Neck W and/or Wo Contrast  Result Date: 06/15/2020 CLINICAL DATA:  Initial evaluation for  acute aphasia. EXAM: CT ANGIOGRAPHY HEAD AND NECK TECHNIQUE: Multidetector CT imaging of the head and neck was performed using the standard protocol during bolus administration of intravenous contrast. Multiplanar CT image reconstructions and MIPs were obtained to evaluate the vascular anatomy. Carotid stenosis measurements (when applicable) are obtained utilizing NASCET criteria, using the distal internal carotid diameter as the denominator. CONTRAST:  63mL OMNIPAQUE IOHEXOL 350 MG/ML SOLN COMPARISON:  Comparison made with prior CTs from earlier the same day. FINDINGS: CTA NECK FINDINGS Aortic arch: Visualized aortic arch of normal caliber with normal 3 vessel morphology. Moderate atheromatous change about the arch and origin of the great vessels without hemodynamically significant stenosis. Subclavian arteries patent bilaterally. Right  carotid system: Right CCA patent from its origin to the bifurcation without stenosis. No significant atheromatous narrowing about the right bifurcation. Diffuse atheromatous irregularity throughout the right ICA without significant stenosis, dissection or occlusion. Left carotid system: Left CCA patent from its origin to the bifurcation without stenosis. Eccentric calcified plaque at the left bifurcation/proximal left ICA without hemodynamically significant stenosis. Left ICA irregular but widely patent distally to the skull base without stenosis, dissection or occlusion. Vertebral arteries: Both vertebral arteries arise from subclavian arteries. No proximal subclavian artery stenosis. Left vertebral artery dominant. Atheromatous plaque about the origin of the left vertebral artery with estimated 50-70% stenosis. Dominant left vertebral artery otherwise patent within the neck. Right vertebral artery diffusely hypoplastic and is largely occluded within the neck. Skeleton: Mildly displaced comminuted fracture of the left clavicular head again noted. Other neck: Scattered soft tissue hematoma related to the left clavicular fracture present within the lower left neck. No visible active contrast extravasation. Sequelae of prior right-sided neck dissection. No visible mass or adenopathy. Upper chest: Better evaluated on concomitant CT of the chest. Review of the MIP images confirms the above findings CTA HEAD FINDINGS Anterior circulation: Petrous segments widely patent bilaterally. Atheromatous plaque within the carotid siphons with moderate stenosis at the para clinoid ICAs bilaterally. A1 segments patent. Right A1 hypoplastic. Normal anterior communicating artery complex. Both ACAs are diffusely irregular but remain perfused and patent to their distal aspects. No M1 stenosis or occlusion. Normal MCA bifurcations. Distal MCA branches well perfused and symmetric. Diffuse small vessel atheromatous irregularity. Posterior  circulation: Focal plaque within the proximal dominant left V4 segment with short-segment mild stenosis. Left vertebral otherwise widely patent. Patent left PICA. Hypoplastic right vertebral artery largely occluded at the skull base. Faint retrograde filling of the distal right V4 segment with perfusion of the right PICA. Basilar widely patent to its distal aspect. Superior cerebellar arteries patent bilaterally. Left PCA primarily supplied via the basilar. Right PCA supplied via the basilar as well as a prominent right posterior communicating artery. Both PCAs are diffusely irregular but remain well perfused or distal aspects. Venous sinuses: Grossly patent allowing for timing the contrast bolus. Anatomic variants: None significant.  No aneurysm. Review of the MIP images confirms the above findings IMPRESSION: 1. Negative CTA for emergent large vessel occlusion. 2. Hypoplastic right vertebral artery largely occluded within the neck. Retrograde filling of the distal right V4 segment with perfusion of the right PICA. 3. 50-70% stenosis at the origin of the dominant left vertebral artery. Left vertebral artery otherwise widely patent within the neck. 4. Moderate short-segment atheromatous stenoses at the para clinoid ICAs bilaterally. 5. Mildly displaced comminuted left clavicular head fracture with associated soft tissue hematoma. No visible active contrast extravasation. 6. Sequelae of prior right-sided neck dissection. Electronically Signed  By: Jeannine Boga M.D.   On: 06/15/2020 04:09   CT CERVICAL SPINE WO CONTRAST  Result Date: 06/15/2020 CLINICAL DATA:  Fall EXAM: CT HEAD WITHOUT CONTRAST CT CERVICAL SPINE WITHOUT CONTRAST TECHNIQUE: Multidetector CT imaging of the head and cervical spine was performed following the standard protocol without intravenous contrast. Multiplanar CT image reconstructions of the cervical spine were also generated. COMPARISON:  Subsequent CT chest, abdomen and pelvis  FINDINGS: CT HEAD FINDINGS Brain: Few scattered lacunar type infarcts in the basal ganglia and right cerebellar hemisphere. No evidence of acute infarction, hemorrhage, hydrocephalus, extra-axial collection, visible mass lesion or mass effect. Symmetric prominence of the ventricles, cisterns and sulci compatible with parenchymal volume loss. Patchy areas of white matter hypoattenuation are most compatible with chronic microvascular angiopathy. Vascular: Atherosclerotic calcification of the carotid siphons and intradural vertebral arteries. No hyperdense vessel. Skull: Several sites of left parieto-occipital scalp swelling including a site of laceration and crescentic scalp hematoma measuring up to 8 mm in maximal thickness. Additional mild right occipital soft calcium Ling as well. No calvarial fracture or sutural diastasis. Sinuses/Orbits: Postsurgical changes and chronic hyperostotic features the bilateral maxillary sinuses. Subtotal opacification of the sphenoid sinuses with some mixed attenuation material including more high attenuation inspissated debris. Additional postsurgical change from prior right mastoidectomy. Surgical change noted along the right superolateral orbit. Bilateral lens extractions. Included orbital contents are otherwise unremarkable. Other: Postsurgical changes from prior right parotidectomy. CT CERVICAL SPINE FINDINGS Alignment: Stabilization collar in place. Straightening and slight reversal of the upper cervical lordosis is similar to comparison CT imaging. Degenerative anterolisthesis C2 on 3 and C7 on T1 are similar to comparison imaging. No evidence of traumatic listhesis. No abnormally widened, perched or jumped facets. Normal alignment of the craniocervical and atlantoaxial articulations accounting for advanced atlantodental Skull base and vertebrae: The osseous structures appear diffusely demineralized which may limit detection of small or nondisplaced fractures. No acute skull  base fracture. No vertebral body fracture or height loss. No suspicious lytic or blastic lesions. Extensive atlantodental and basion dens arthrosis with calcific pannus formation, nonspecific but can be seen with CPPD or rheumatoid arthropathy. Multilevel cervical spondylitic changes are present as well, better detailed below. Soft tissues and spinal canal: No pre or paravertebral fluid or swelling. No visible canal hematoma. Airways patent. Prior right cervical neck dissection and parotidectomy. Disc levels: Multilevel intervertebral disc height loss with spondylitic endplate changes. Essentially complete disc space effacement C3-C7 with spondylitic ridging, uncinate spurring, disc osteophyte complex formation hypertrophic facet arthropathy. Findings result in some chronic mild to moderate canal stenosis C3-C7. Multilevel mild-to-moderate foraminal narrowing is present as well with more moderate to severe narrowing bilaterally at C3-4. Upper chest: Comminuted fracture of the medial head left clavicle with anterior displacement of the fracture fragment, surrounding hematoma and soft tissue swelling. Edema and swelling extends across the base of the left neck and superficial to the sternocleidomastoid. Distention and synovitis of the left subacromial/subdeltoid bursa, partially included within imaging. Other: Cervical carotid and vertebral artery atherosclerosis. Normal thyroid. IMPRESSION: 1. No acute intracranial abnormality. 2. Chronic microvascular ischemic white matter disease and parenchymal volume loss. 3. Scattered lacunar type infarcts in the basal ganglia and right cerebellar hemisphere. 4. Several sites of left parieto-occipital scalp swelling including a site of laceration and crescentic scalp hematoma measuring up to 8 mm in maximal thickness. No subjacent calvarial fracture. 5. Subtotal opacification of the sphenoid sinuses with some mixed attenuation material including more high attenuation inspissated  debris, can be seen  in the setting of chronic allergic fungal sinusitis. 6. No evidence of acute fracture or traumatic listhesis of the cervical spine. 7. Multilevel cervical spondylitic changes, as above. 8. Comminuted fracture of the medial head left clavicle with anterior displacement of the proximal third clavicle, surrounding hematoma and soft tissue swelling. Edema and swelling extends across the base of the left neck and superficial to the sternocleidomastoid. 9. Distention and synovitis of the left subacromial/subdeltoid bursa, partially included within imaging. Possibly chronic. 10. Prior right cervical neck dissection and parotidectomy. 11. Intracranial and cervical atherosclerosis. Electronically Signed   By: Lovena Le M.D.   On: 06/15/2020 02:12   MR BRAIN WO CONTRAST  Result Date: 06/15/2020 CLINICAL DATA:  Head trauma with abnormal mental status. Weakness and confusion. EXAM: MRI HEAD WITHOUT CONTRAST TECHNIQUE: Multiplanar, multiecho pulse sequences of the brain and surrounding structures were obtained without intravenous contrast. COMPARISON:  Head CT from earlier today FINDINGS: Brain: Scattered small areas of restricted diffusion along the high left frontal lobe and to an even lesser extent in the left frontal parietal cortex. Small focus of restricted diffusion at the splenium of the corpus callosum is an uncommon location for ischemia but is attributed to small perforator infarct in this setting. Confluent chronic small vessel ischemia in the cerebral white matter, mainly around the lateral ventricles. Small remote bilateral cerebellar infarctions. Mild petechial hemorrhage associated with the largest left frontal infarct. There are a few remote microhemorrhages and chronic blood products in the inferior left cerebellum measuring 14 mm, a site with gliosis by CT since 2018 Vascular: Dominant left vertebral artery with no visible right vertebral artery. No flow void in the right sigmoid and  upper internal jugular veins on axial T2 weighted imaging, attributed to flow artifact given normal appearance on coronal T2. Skull and upper cervical spine: No focal marrow lesion is seen. Sinuses/Orbits: Sequela of chronic sinusitis with opacified postoperative left maxillary sinus, left sphenoid sinus, and right mastoid ectomy bulb. Mastoid opacification is long-standing. Bilateral cataract resection. Other: Scattered areas of scalp swelling and scarring. IMPRESSION: 1. Small, patchy acute infarcts in the left MCA to PCA distribution as described. Mild petechial hemorrhage at the largest acute infarct which is in the high left frontal lobe. 2. Chronic small vessel ischemia. 3. Sinus and right mastoidectomy opacifications. Electronically Signed   By: Monte Fantasia M.D.   On: 06/15/2020 05:18   DG Pelvis Portable  Result Date: 06/15/2020 CLINICAL DATA:  Unwitnessed fall EXAM: PORTABLE PELVIS 1-2 VIEWS COMPARISON:  02/11/2016 FINDINGS: Pins are seen within the proximal left femur related to remote injury. No acute fracture, subluxation or dislocation. Degenerative changes in the hips bilaterally and visualized lower lumbar spine. IMPRESSION: No acute bony abnormality. Electronically Signed   By: Rolm Baptise M.D.   On: 06/15/2020 00:53   CT CHEST ABDOMEN PELVIS W CONTRAST  Result Date: 06/15/2020 CLINICAL DATA:  Acute pain due to trauma EXAM: CT CHEST, ABDOMEN, AND PELVIS WITH CONTRAST TECHNIQUE: Multidetector CT imaging of the chest, abdomen and pelvis was performed following the standard protocol during bolus administration of intravenous contrast. CONTRAST:  100 mL of Isovue 370. COMPARISON:  03/10/2014 CT chest.  CT of the pelvis dated 02/23/2015 FINDINGS: CT CHEST FINDINGS Cardiovascular: The heart size is enlarged. Aortic calcifications are noted. There is no evidence for thoracic aortic aneurysm. No evidence for thoracic aortic dissection. The pulmonary arteries are dilated. Chronic pulmonary emboli  are noted involving the right lower lobe pulmonary artery (axial series 3, image 34).  No definite evidence for an acute pulmonary embolism. There is no significant pericardial effusion. Coronary artery calcifications are noted. Mediastinum/Nodes: -- No mediastinal lymphadenopathy. -- No hilar lymphadenopathy. -- No axillary lymphadenopathy. -- No supraclavicular lymphadenopathy. -- Normal thyroid gland where visualized. -  Unremarkable esophagus. Lungs/Pleura: There is atelectasis at the lung bases. There are chronic areas of fibrosis at the lung bases bilaterally. There is no large pleural effusion. There is a pulmonary nodule at the left lung base measuring 1.3 cm (axial series 5, image 119). This appears to have slightly increased in size since 2016. There is no pneumothorax. Musculoskeletal: There is an acute, somewhat displaced fracture of the medial left clavicle near the sternoclavicular joint. There is surrounding soft tissue swelling. There are end-stage degenerative changes of the bilateral glenohumeral joints. There is an apparent old healed fracture of the sternal body. CT ABDOMEN PELVIS FINDINGS Hepatobiliary: The liver is normal. Status post cholecystectomy.There is no biliary ductal dilation. Pancreas: Normal contours without ductal dilatation. No peripancreatic fluid collection. Spleen: Unremarkable. Adrenals/Urinary Tract: --Adrenal glands: Unremarkable. --Right kidney/ureter: No hydronephrosis or radiopaque kidney stones. --Left kidney/ureter: There is a relatively stable calcified exophytic nodule arising from the lower pole of the left kidney. --Urinary bladder: Unremarkable. Stomach/Bowel: --Stomach/Duodenum: No hiatal hernia or other gastric abnormality. Normal duodenal course and caliber. --Small bowel: Unremarkable. --Colon: Rectosigmoid diverticulosis without acute inflammation. --Appendix: Normal. Vascular/Lymphatic: Atherosclerotic calcification is present within the non-aneurysmal  abdominal aorta, without hemodynamically significant stenosis. --No retroperitoneal lymphadenopathy. --No mesenteric lymphadenopathy. --No pelvic or inguinal lymphadenopathy. Reproductive: There is a large left-sided hydrocele. Other: No ascites or free air. The abdominal wall is normal. Musculoskeletal. There are degenerative changes throughout the visualized lumbar spine. The patient is status post prior left hip percutaneous pinning. IMPRESSION: 1. Acute, somewhat displaced fracture of the medial left clavicle near the sternoclavicular joint. There is surrounding soft tissue swelling. No pneumothorax. 2. Chronic pulmonary emboli involving the right lower lobe pulmonary artery. No definite evidence for an acute pulmonary embolism. The central pulmonary arteries are dilated suggestive of pulmonary artery hypertension. 3. Cardiomegaly and coronary artery disease. 4. Chronic areas of fibrosis at the lung bases bilaterally. 5. There is a 1.3 cm pulmonary nodule in the left lower lobe which appears to have increased since prior studies. Consider one of the following in 3 months for both low-risk and high-risk individuals: (a) repeat chest CT, (b) follow-up PET-CT, or (c) tissue sampling. This recommendation follows the consensus statement: Guidelines for Management of Incidental Pulmonary Nodules Detected on CT Images: From the Fleischner Society 2017; Radiology 2017; 284:228-243. Aortic Atherosclerosis (ICD10-I70.0). Electronically Signed   By: Constance Holster M.D.   On: 06/15/2020 01:55   DG Chest Port 1 View  Result Date: 06/15/2020 CLINICAL DATA:  Unwitnessed fall, unresponsive EXAM: PORTABLE CHEST 1 VIEW COMPARISON:  02/11/2016 FINDINGS: Bibasilar airspace opacities, favor atelectasis. Heart is borderline in size. No visible effusions or pneumothorax. No acute bony abnormality. IMPRESSION: Bibasilar opacities, favor atelectasis Electronically Signed   By: Rolm Baptise M.D.   On: 06/15/2020 00:52    EKG:  not done   Labs on Admission: I have personally reviewed the available labs and imaging studies at the time of the admission.  Pertinent labs:   Na++ 132 Glucose 133 Bili 1.6 Lactate 3.1 WBC 17.6 INR 1.2 COVID/flu negative ETOH <10   Assessment/Plan Active Problems:   Hypothyroidism   Essential hypertension   MGUS (monoclonal gammopathy of unknown significance)   DNR (do not resuscitate) discussion   Acute  CVA (cerebrovascular accident) ([place])   Fall at nursing home   Displaced fracture of shaft of left clavicle, initial encounter for closed fracture   Atrial fibrillation, chronic ([place])   -Patient presenting with fall at SNF -He was found to have acute CVA, likely due to afib -He has a displaced clavicle fracture, likely due to the fall -After discussion by EDP, family has decided to proceed with comfort care only -He will be admitted to [facility] for comfort care and palliative care consult -Patient is likely actively dying at this time -Comfort care order set utilized -Pain control with morphine as needed, would transition to a drip if needed but patient does not appear to be in pain at this time   Anticipated causes of death: Acute left MCA CVA Resulting from Chronic atrial fibrillation, not on anticoagulation       DVT prophylaxis: None - comfort measures Code Status: DNR - confirmed with family Family Communication: Daughters were present throughout evaluations Disposition Plan: Anticipate in-hospital death Consults called: Orthopedics; Neurology Admission status: Admit - It is my clinical opinion that admission to INPATIENT is reasonable and necessary because of the expectation that this patient will require hospital care that crosses at least 2 midnights to treat this condition based on the medical complexity of the problems presented.  Given the aforementioned information, the predictability of an adverse outcome is felt to be significant.    Karmen Bongo  MD [facility]   How to contact the [facility] Attending or Consulting provider [place] or covering provider during after hours [place], for this patient?  1. Check the care team in [facility] and look for a) attending/consulting [facility] provider listed and b) the [facility] team listed 2. Log into [email or web address] and use [place] universal password to access. If you do not have the password, please contact the hospital operator. 3. Locate the [facility] provider you are looking for under [facility] and page to a number that you can be directly reached. 4. If you still have difficulty reaching the provider, please page the [facility] (Director on Call) for the Hospitalists listed on amion for assistance.   06/15/2020, 3:15 PM

## 2020-06-15 NOTE — Consult Note (Signed)
Consultation Note Date: 06/15/2020   Patient Name: Adrian West  DOB: 06/19/1927  MRN: [id number]  Age / Sex: 87 y.o., male  PCP: [name], MD Referring Physician: Karmen Bongo, MD  Reason for Consultation: Non pain symptom management, Pain control, Psychosocial/spiritual support and Terminal Care  HPI/Patient Profile: 87 y.o. male  with past medical history of TIA, squamous cell carcinoma, lumbar spinal stenosis, salivary gland neoplasm, secondary malignancy of parotid lymph nodes, MGUS, interstitial lung disease, and dementia presented to the ED on 06/15/20 from [facility] after an unwitnessed fall. Patient was admitted on 06/15/2020 for end of life care.   ED Course:  87 yo M with stroke, had fall, has clavicle fracture. [name] going to see in AM from that perspective.  Did get large amount of pain meds due to clavicle fracture. EDP going to repair head lac when he gets back from MRI. [name] consulted for neuro. DNR/DNI per family. Also with hypotension, appears to be actively dying.    Clinical Assessment and Goals of Care: I have reviewed medical records including EPIC notes, labs, and imaging. Received report from primary RN - no acute concerns.   Went to visit patient at bedside - daughter/Libby was present. Patient was lying in bed unresponsive - he did not wake to voice or gentle touch. No signs or non-verbal gestures of pain or discomfort noted. No respiratory distress, increased work of breathing, or secretions noted. Patient is on non-rebreather.  Met with daughter/Libby to discuss diagnosis, prognosis, GOC, EOL wishes, disposition, and options.  I introduced Palliative Medicine as specialized medical care for people living with serious illness. It focuses on providing relief from the symptoms and stress of a serious illness. The goal is to improve quality of life  for both the patient and the family.  We discussed a brief life review of the patient as well as functional and nutritional status. Mr. Cradle grew up in [place], [place] states he knew "almost everyone." He enjoyed taking time to send hand written letters to family and friends. Mr. Leyh was married, but his wife passed away about 2 years ago - they had three daughters together. Prior to hospitalization, the patient was living at [facility]. [name] tells me the patient was alert and oriented and doing well prior to his fall. Therapeutic listening was provided as [name] describes medical obstacles the patient has faced in the past, including surgery that left him with one functioning lung as well as cancer/surgery to the right side of his head/face. [name] describes the patient as "strong." [name] states that the patient has not had an appetite for 7 years and "has lived off ensure."   We discussed patient's current illness and what it means in the larger context of patient's on-going co-morbidities. Natural disease trajectory and expectations at EOL were discussed. I attempted to elicit values and goals of care important to the patient.  [name] stated their (siblings) decision has been to move to full comfort care- their goal is  to keep the patient free from suffering. We talked about transition to comfort measures in house and what that would entail inclusive of medications to control pain, dyspnea, agitation, nausea, itching, and hiccups. We discussed stopping all uneccessary measures such as blood draws, needle sticks, oxygen, antibiotics, CBGs/insulin, cardiac monitoring, and frequent vital signs. We discussed in detail weaning the patient off non-rebreather with education provided that any symptoms that may arise would be supported with medications. [name] is agreeable to wean NRB after her other sister arrives from [place]. Libby expressed concern over morphine - addressed misconceptions.  Education was provided that morphine is used for pain and shortness of breath symptoms; it is not used to hasten the dying processes as we cannot morally, ethically, or legally give it for that reason. Explained that we utilize medications only as needed to provided the patient comfort at EOL and we focus on nonverbal/verbal cues the patient gives - reviewed non-verbal gestures of pain we monitor for. Libby expressed understanding and was appreciative of information.   Explained that in patient's current medical situation, he seems too unstable for transfer at this time and risk for passing away during transport is high. Discussed keeping the patient in house for EOL care vs transfer to hospice. [name] also feels patient is too unstable for transfer and would like to keep patient in house at this time for EOL care.   Visit also consisted of discussions dealing with the complex and emotionally intense issues of symptom management and palliative care in the setting of serious and life-threatening illness. Palliative care team will continue to support patient, patient's family, and medical team.  Discussed with [name] the importance of continued conversation with family and the medical providers regarding overall plan of care and treatment options, ensuring decisions are within the context of the patient's values and GOCs.    Questions and concerns were addressed. The patient/family was encouraged to call with questions and/or concerns. PMT card was provided.  "Gone from My Sight" book was provided to [facility].  Discussed with RN symptom management plan as well as plan to wean NRB.   Primary Decision Maker NEXT OF KIN - 43 of patient's reasonably available adult children- he has three daughters    SUMMARY OF RECOMMENDATIONS:   . Continue full comfort measures . Continue DNR/DNI as previously documented . Patient is not stable for transfer to residential hospice at this time, anticipate hospital  death, likely hours to days . Added orders for EOL symptom management and to reflect full comfort measures, as well as discontinued orders that were not focused on comfort . Unrestricted visitation orders were placed per current [facility] EOL visitation policy  . Provide frequent assessments and administer PRN medications as clinically necessary to ensure EOL comfort . PMT will continue to follow holistically   Code Status/Advance Care Planning:  DNR  Palliative Prophylaxis:   Aspiration, Bowel Regimen, Delirium Protocol, Frequent Pain Assessment, Oral Care and Turn Reposition  Additional Recommendations (Limitations, Scope, Preferences):  Full Comfort Care  Psycho-social/Spiritual:   Desire for further Chaplaincy support:yes Created space and opportunity for patient and family to express thoughts and feelings regarding patient's current medical situation.   Emotional support and therapeutic listening provided.  Prognosis:   Hours - Days  Discharge Planning: Anticipated Hospital Death      Primary Diagnoses: Present on Admission: **None**   I have reviewed the medical record, interviewed the patient and family, and examined the patient. The following aspects are pertinent.  Past Medical History:  Diagnosis Date  . Bell's palsy   . Benign neoplasm of colon   . Deaf   . Diverticulosis   . DNR (do not resuscitate) discussion 07/22/2014  . 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 Interstitial lung disease 10/15/13 Xray [facility]) 01/02/12 new subsegmental ATX on L (vs 09/03/11) Seeing Dr Theda Sers [facility]  Seen by Dr Corrin Parker, [facility],  02/2011. Component of thoracic height loss & alcohol related neuropathy questioned. No plication recommended  5/4- 10/14/2013 @ [facility] ; Dr  Ellis Parents.10/15/13 new focal consolidation mid anterior L lung:aspiration vs PNA . Underlying interstitial lung disease.10/15/13 Rx sent to [facility] November for Doxycycline & Augmentin by Dr Jeannine Kitten ( not picked up as of 10/18/13).  Flew back to [facility]; seen @ 10/16/13 [facility], [place]. Levaquin X 5 days , Dr Evalee Mutton    . Esophageal stricture   . Gait disturbance   . GERD 02/14/2008   Qualifier: Diagnosis of  By: [name] MD, [name]    . Hiatal hernia   . HYPERTENSION 02/14/2008       . HYPOTHYROIDISM 02/14/2008  . Interstitial lung disease ([place]) 05/05/2014  . Memory loss 12/31/2009   Qualifier: Diagnosis of  By: [name] MD, [name]    . MGUS (monoclonal gammopathy of unknown significance) 04/11/2013  . MITRAL REGURGITATION 02/14/2008   Qualifier: Diagnosis of  By: [name] MD, Cherlynn Kaiser MV surgery 11/2008, [name].complcated by paralysis of diaphragm     . Neuropathy, peripheral   . Other and unspecified hyperlipidemia 02/24/2012  . Polycythemia, secondary 01/10/2014  . PVC's (premature ventricular contractions) 04/05/2011  . Radiation 06/11/14- 07/22/14   Right parotid bed, posterior auricular, right upper neck.  . Salivary gland malignant neoplasm ([place]) 03/10/2014  . Secondary malignancy of parotid lymph nodes ([place]) 04/30/2014  . SPINAL STENOSIS, LUMBAR 07/28/2010   Qualifier: Diagnosis of  By: [name] MD, [name], NS ( retired)    . Squamous carcinoma   . TIA (transient ischemic attack) May 2012  . Tremor, essential   . Vertigo 2005   Social History   Socioeconomic History  . Marital status: Married    Spouse name: Not on file  . Number of children: 3  . Years of education: Not on file  . Highest education level: Not on file  Occupational History  . Occupation: retired Equities trader   Tobacco Use  . Smoking status: Never Smoker  . Smokeless tobacco: Never Used  Vaping Use  . Vaping Use: Never used  Substance and Sexual  Activity  . Alcohol use: No    Alcohol/week: 0.0 standard drinks    Comment: Drinks wine 3 per night  . Drug use: No  . Sexual activity: Not Currently  Other Topics Concern  . 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   Married x 53 years.  They have three daughters.   Social Determinants of Health   Financial Resource Strain: Not on file  Food Insecurity: Not on file  Transportation Needs: Not on file  Physical Activity: Not on file  Stress: Not on file  Social Connections: Not on file   Family History  Problem Relation Age of Onset  . Colon cancer Mother  62  . Heart attack Father 78  . Heart attack Maternal Grandmother 60  . Uterine cancer Paternal Grandmother   . Healthy Sister   . Stroke Neg Hx   . Diabetes Neg Hx    Scheduled Meds: Continuous Infusions: PRN Meds:.acetaminophen **OR** acetaminophen, antiseptic oral rinse, glycopyrrolate **OR** glycopyrrolate **OR** glycopyrrolate, haloperidol **OR** haloperidol **OR** haloperidol lactate, morphine injection, ondansetron **OR** ondansetron (ZOFRAN) IV, polyvinyl alcohol Medications Prior to Admission:  Prior to Admission medications   Medication Sig Start Date End Date Taking? Authorizing Provider  amoxicillin (AMOXIL) 500 MG capsule Take 2,000 mg by mouth as needed (prior to dental procedure).   Yes [provider]  aspirin EC 81 MG tablet Take 1 tablet (81 mg total) by mouth daily. Swallow whole. 03/04/20  Yes Burtis Junes, NP  levothyroxine (SYNTHROID) 75 MCG tablet Take 75 mcg by mouth daily before breakfast.   Yes [provider]  meclizine (ANTIVERT) 25 MG tablet Take 25 mg by mouth 3 (three) times daily as needed for dizziness.   Yes [provider]  methylphenidate (RITALIN) 10 MG tablet Take 10 mg by mouth daily.  03/22/18  Yes [provider]  metoprolol succinate (TOPROL XL) 25 MG 24 hr tablet Take 1  tablet (25 mg total) daily by mouth. 04/11/17  Yes Burtis Junes, NP  Polyethyl Glycol-Propyl Glycol (SYSTANE ULTRA OP) Apply 2 drops to eye 2 (two) times daily. Right eye only   Yes [provider]  Vitamin D, Ergocalciferol, (DRISDOL) 50000 units CAPS capsule Take 50,000 Units by mouth every Monday.   Yes [provider]  White Petrolatum-Mineral Oil (REFRESH LACRI-LUBE) OINT Place 1 Dose into the right eye at bedtime.   Yes [provider]   Allergies  Allergen Reactions  . Plavix [Clopidogrel] Other (See Comments)    Cannot take med   Review of Systems  Unable to perform ROS: Acuity of condition    Physical Exam Vitals and nursing note reviewed.  Constitutional:      General: He is not in acute distress.    Appearance: He is ill-appearing.     Interventions: Face mask in place.  Pulmonary:     Effort: No respiratory distress.  Skin:    General: Skin is cool and dry.  Neurological:     Mental Status: He is unresponsive.     Motor: Weakness present.  Psychiatric:        Speech: He is noncommunicative.     Vital Signs: BP (!) 75/53   Pulse 73   Temp 98.7 F (37.1 C) (Rectal)   Resp 18   Ht 5' 10"  (1.778 m)   Wt 74 kg   SpO2 100%   BMI 23.41 kg/m  Pain Scale: PAINAD       SpO2: SpO2: 100 % O2 Device:SpO2: 100 % O2 Flow Rate: .O2 Flow Rate (L/min): 15 L/min  IO: Intake/output summary:   Intake/Output Summary (Last 24 hours) at 06/15/2020 1041 Last data filed at 06/15/2020 0618 Gross per 24 hour  Intake 1850 ml  Output -  Net 1850 ml    LBM:   Baseline Weight: Weight: 74 kg Most recent weight: Weight: 74 kg     Palliative Assessment/Data: PPS 10%     Time In: 1100 Time Out: 1212 Time Total: 72 minutes  Greater than 50%  of this time was spent counseling and coordinating care related to the above assessment and plan.  Signed by: Lin Landsman, NP   Please  contact Palliative Medicine Team phone at [phone] for  questions and concerns.  For individual provider: See Shea Evans

## 2020-06-15 NOTE — Consult Note (Signed)
ORTHOPAEDIC CONSULTATION  REQUESTING PHYSICIAN: Karmen Bongo, MD  Chief Complaint: left clavicle fracture  HPI: Adrian West is a 87 y.o. male with medical history significant of salivary gland neoplasm; HLD; MGUS; dementia; afib; ILD; hypothyroidism; and HTN presenting with a fall. Patient has also suffered from a stroke. Neurology following. Orthopedics consulted for left medial clavicle fracture. Patient became hypotensive. He is DNR/DNI.   Patient seen in the ED. Patient appears to be in extremis. Given fentanyl for comfort.   Past Medical History:  Diagnosis Date  . Anorexia 08/16/2014  . Bell's palsy   . Benign neoplasm of colon   . 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 Deaf   . Diaphragm paralysis   . Diverticulosis   . DNR (do not resuscitate) discussion 07/22/2014  . Dyspnea and respiratory abnormality 03/10/2014  . 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 Interstitial lung disease 10/15/13 Xray [facility]) 01/02/12 new subsegmental ATX on L (vs 09/03/11) Seeing Dr Theda S[facility]  Seen by Dr Corrin Parker, [facility],  02/2011. Component of thoracic height loss & alcohol related neuropathy questioned. No plication recommended  5/4- 10/14/2013 @ [facility] ; Dr  Ellis Parents.10/15/13 new focal consolidation mid anterior L lung:aspiration vs PNA . Underlying interstitial lung disease.10/15/13 Rx sent to [facility] for Doxycycline & Augmentin by Dr Jeannine Kitten ( not picked up as of 10/18/13).  Flew back to [facility]; seen @ 10/16/13 [facility], [place]. Levaquin X 5 days , Dr Evalee Mutton    . Esophageal stricture   . Gait disturbance   . GERD 02/14/2008   Qualifier: Diagnosis of  By: [name] MD, [name]    . Hiatal hernia   . History of radiation therapy 06/11/14-07/22/14   skin cancer  of scalp/face  . HYPERTENSION 02/14/2008       . HYPOTHYROIDISM 02/14/2008  . Interstitial lung disease ([place]) 05/05/2014  . Loss of weight 07/22/2014  . Lung abnormality 11/25/2008   "nerve damage from heart surgery"  . Memory loss 12/31/2009   Qualifier: Diagnosis of  By: [name] MD, [name]    . MGUS (monoclonal gammopathy of unknown significance) 04/11/2013  . MITRAL REGURGITATION 02/14/2008   Qualifier: Diagnosis of  By: [name] MD, Cherlynn Kaiser MV surgery 11/2008, [name].complcated by paralysis of diaphragm     . 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 Neuropathy, peripheral   . Other and unspecified hyperlipidemia 02/24/2012  . Polycythemia, secondary 01/10/2014  . PVC's (premature ventricular contractions) 04/05/2011  . Radiation 06/11/14- 07/22/14   Right parotid bed, posterior auricular, right upper neck.  . Salivary gland malignant neoplasm ([place]) 03/10/2014  . 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 Skin cancer of scalp or skin of neck 05/21/2014  . SPINAL STENOSIS, LUMBAR 07/28/2010   Qualifier: Diagnosis of  By: [name] MD, [name], NS ( retired)    . Squamous carcinoma   . TIA (transient ischemic attack) May 2012  . Tremor 2015  . Tremor, essential   . Vertigo 2005  . Vertigo 2005  . Weakness generalized 07/22/2014   Past Surgical History:  Procedure Laterality Date  . APPENDECTOMY    .  BROW LIFT Right 04/25/2014   Midforehead  . CARDIAC CATHETERIZATION    . CARDIAC VALVE REPLACEMENT    . CATARACT EXTRACTION     [name]  . CHOLECYSTECTOMY  1998   [name]  . ECTROPION REPAIR Right 04/25/2014   Right tarsal strip  . ESOPHAGEAL DILATION      X3;[name]  . HIP PINNING,CANNULATED Left 02/13/2016   Procedure: CANNULATED LEFT HIP PINNING;  Surgeon: [name], MD;  Location: [place];  Service: Orthopedics;   Laterality: Left;  . INGUINAL HERNIA REPAIR    . LAPAROSCOPIC ABLATION RENAL MASS  08/14/2009  . LUMBAR LAMINECTOMY    . 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 MOHS SURGERY  2012    ear  . NASAL SINUS SURGERY    . Nasolabial repair Right 04/25/2014   Right static sling using AlloDerm graft elevation of the nasolabial fold  . NECK DISSECTION Right 04/25/2014  . PAROTIDECTOMY Right 04/25/2014   Sacrifice of the facial nerve  . Partial auriculectomy Right 04/25/2014  . RIGHT HEART CATHETERIZATION N/A 03/03/2014   Procedure: RIGHT HEART CATH;  Surgeon: [name], MD;  Location: [facility] CATH LAB;  Service: Cardiovascular;  Laterality: N/A;  . TILT TABLE STUDY N/A 05/06/2013   Procedure: TILT TABLE STUDY;  Surgeon: [name], MD;  Location: [facility] CATH LAB;  Service: Cardiovascular;  Laterality: N/A;  . TONSILLECTOMY     Social History   Socioeconomic History  . Marital status: Married    Spouse name: Not on file  . Number of children: 3  . Years of education: Not on file  . Highest education level: Not on file  Occupational History  . Occupation: retired Equities trader   Tobacco Use  . Smoking status: Never Smoker  . Smokeless tobacco: Never Used  Vaping Use  . Vaping Use: Never used  Substance and Sexual Activity  . Alcohol use: No    Alcohol/week: 0.0 standard drinks    Comment: Drinks wine 3 per night  . Drug use: No  . Sexual activity: Not Currently  Other Topics Concern  . 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   Married x 53 years.  They have three daughters.   Social Determinants of Health   Financial Resource Strain: Not on file  Food Insecurity: Not on file  Transportation Needs: Not on file  Physical Activity: Not on file  Stress: Not on file  Social Connections: Not on file   Family History  Problem Relation Age of  Onset  . Colon cancer Mother 32  . Heart attack Father 66  . Heart attack Maternal Grandmother 60  . Uterine cancer Paternal Grandmother   . Healthy Sister   . Stroke Neg Hx   . Diabetes Neg Hx    Allergies  Allergen Reactions  . Plavix [Clopidogrel] Other (See Comments)    Cannot take med   Prior to Admission medications   Medication Sig Start Date End Date Taking? Authorizing Provider  amoxicillin (AMOXIL) 500 MG capsule Take 2,000 mg by mouth as needed (prior to dental procedure).   Yes [provider]  aspirin EC 81 MG tablet Take 1 tablet (81 mg total) by mouth daily. Swallow whole. 03/04/20  Yes Burtis Junes, NP  levothyroxine (SYNTHROID) 75 MCG tablet Take 75 mcg by mouth daily before breakfast.  Yes [provider]  meclizine (ANTIVERT) 25 MG tablet Take 25 mg by mouth 3 (three) times daily as needed for dizziness.   Yes [provider]  methylphenidate (RITALIN) 10 MG tablet Take 10 mg by mouth daily.  03/22/18  Yes [provider]  metoprolol succinate (TOPROL XL) 25 MG 24 hr tablet Take 1 tablet (25 mg total) daily by mouth. 04/11/17  Yes Burtis Junes, NP  Polyethyl Glycol-Propyl Glycol (SYSTANE ULTRA OP) Apply 2 drops to eye 2 (two) times daily. Right eye only   Yes [provider]  Vitamin D, Ergocalciferol, (DRISDOL) 50000 units CAPS capsule Take 50,000 Units by mouth every Monday.   Yes [provider]  White Petrolatum-Mineral Oil (REFRESH LACRI-LUBE) OINT Place 1 Dose into the right eye at bedtime.   Yes [provider]   CT Angio Head W or Wo Contrast  Result Date: 06/15/2020 CLINICAL DATA:  Initial evaluation for acute aphasia. EXAM: CT ANGIOGRAPHY HEAD AND NECK TECHNIQUE: Multidetector CT imaging of the head and neck was performed using the standard protocol during bolus administration of intravenous contrast. Multiplanar CT image reconstructions and MIPs were obtained to evaluate the vascular  anatomy. Carotid stenosis measurements (when applicable) are obtained utilizing NASCET criteria, using the distal internal carotid diameter as the denominator. CONTRAST:  52m OMNIPAQUE IOHEXOL 350 MG/ML SOLN COMPARISON:  Comparison made with prior CTs from earlier the same day. FINDINGS: CTA NECK FINDINGS Aortic arch: Visualized aortic arch of normal caliber with normal 3 vessel morphology. Moderate atheromatous change about the arch and origin of the great vessels without hemodynamically significant stenosis. Subclavian arteries patent bilaterally. Right carotid system: Right CCA patent from its origin to the bifurcation without stenosis. No significant atheromatous narrowing about the right bifurcation. Diffuse atheromatous irregularity throughout the right ICA without significant stenosis, dissection or occlusion. Left carotid system: Left CCA patent from its origin to the bifurcation without stenosis. Eccentric calcified plaque at the left bifurcation/proximal left ICA without hemodynamically significant stenosis. Left ICA irregular but widely patent distally to the skull base without stenosis, dissection or occlusion. Vertebral arteries: Both vertebral arteries arise from subclavian arteries. No proximal subclavian artery stenosis. Left vertebral artery dominant. Atheromatous plaque about the origin of the left vertebral artery with estimated 50-70% stenosis. Dominant left vertebral artery otherwise patent within the neck. Right vertebral artery diffusely hypoplastic and is largely occluded within the neck. Skeleton: Mildly displaced comminuted fracture of the left clavicular head again noted. Other neck: Scattered soft tissue hematoma related to the left clavicular fracture present within the lower left neck. No visible active contrast extravasation. Sequelae of prior right-sided neck dissection. No visible mass or adenopathy. Upper chest: Better evaluated on concomitant CT of the chest. Review of the MIP  images confirms the above findings CTA HEAD FINDINGS Anterior circulation: Petrous segments widely patent bilaterally. Atheromatous plaque within the carotid siphons with moderate stenosis at the para clinoid ICAs bilaterally. A1 segments patent. Right A1 hypoplastic. Normal anterior communicating artery complex. Both ACAs are diffusely irregular but remain perfused and patent to their distal aspects. No M1 stenosis or occlusion. Normal MCA bifurcations. Distal MCA branches well perfused and symmetric. Diffuse small vessel atheromatous irregularity. Posterior circulation: Focal plaque within the proximal dominant left V4 segment with short-segment mild stenosis. Left vertebral otherwise widely patent. Patent left PICA. Hypoplastic right vertebral artery largely occluded at the skull base. Faint retrograde filling of the distal right V4 segment with perfusion of the right PICA. Basilar widely patent to  its distal aspect. Superior cerebellar arteries patent bilaterally. Left PCA primarily supplied via the basilar. Right PCA supplied via the basilar as well as a prominent right posterior communicating artery. Both PCAs are diffusely irregular but remain well perfused or distal aspects. Venous sinuses: Grossly patent allowing for timing the contrast bolus. Anatomic variants: None significant.  No aneurysm. Review of the MIP images confirms the above findings IMPRESSION: 1. Negative CTA for emergent large vessel occlusion. 2. Hypoplastic right vertebral artery largely occluded within the neck. Retrograde filling of the distal right V4 segment with perfusion of the right PICA. 3. 50-70% stenosis at the origin of the dominant left vertebral artery. Left vertebral artery otherwise widely patent within the neck. 4. Moderate short-segment atheromatous stenoses at the para clinoid ICAs bilaterally. 5. Mildly displaced comminuted left clavicular head fracture with associated soft tissue hematoma. No visible active contrast  extravasation. 6. Sequelae of prior right-sided neck dissection. Electronically Signed   By: Jeannine Boga M.D.   On: 06/15/2020 04:09   CT HEAD WO CONTRAST  Result Date: 06/15/2020 CLINICAL DATA:  Fall EXAM: CT HEAD WITHOUT CONTRAST CT CERVICAL SPINE WITHOUT CONTRAST TECHNIQUE: Multidetector CT imaging of the head and cervical spine was performed following the standard protocol without intravenous contrast. Multiplanar CT image reconstructions of the cervical spine were also generated. COMPARISON:  Subsequent CT chest, abdomen and pelvis FINDINGS: CT HEAD FINDINGS Brain: Few scattered lacunar type infarcts in the basal ganglia and right cerebellar hemisphere. No evidence of acute infarction, hemorrhage, hydrocephalus, extra-axial collection, visible mass lesion or mass effect. Symmetric prominence of the ventricles, cisterns and sulci compatible with parenchymal volume loss. Patchy areas of white matter hypoattenuation are most compatible with chronic microvascular angiopathy. Vascular: Atherosclerotic calcification of the carotid siphons and intradural vertebral arteries. No hyperdense vessel. Skull: Several sites of left parieto-occipital scalp swelling including a site of laceration and crescentic scalp hematoma measuring up to 8 mm in maximal thickness. Additional mild right occipital soft calcium Ling as well. No calvarial fracture or sutural diastasis. Sinuses/Orbits: Postsurgical changes and chronic hyperostotic features the bilateral maxillary sinuses. Subtotal opacification of the sphenoid sinuses with some mixed attenuation material including more high attenuation inspissated debris. Additional postsurgical change from prior right mastoidectomy. Surgical change noted along the right superolateral orbit. Bilateral lens extractions. Included orbital contents are otherwise unremarkable. Other: Postsurgical changes from prior right parotidectomy. CT CERVICAL SPINE FINDINGS Alignment: Stabilization  collar in place. Straightening and slight reversal of the upper cervical lordosis is similar to comparison CT imaging. Degenerative anterolisthesis C2 on 3 and C7 on T1 are similar to comparison imaging. No evidence of traumatic listhesis. No abnormally widened, perched or jumped facets. Normal alignment of the craniocervical and atlantoaxial articulations accounting for advanced atlantodental Skull base and vertebrae: The osseous structures appear diffusely demineralized which may limit detection of small or nondisplaced fractures. No acute skull base fracture. No vertebral body fracture or height loss. No suspicious lytic or blastic lesions. Extensive atlantodental and basion dens arthrosis with calcific pannus formation, nonspecific but can be seen with CPPD or rheumatoid arthropathy. Multilevel cervical spondylitic changes are present as well, better detailed below. Soft tissues and spinal canal: No pre or paravertebral fluid or swelling. No visible canal hematoma. Airways patent. Prior right cervical neck dissection and parotidectomy. Disc levels: Multilevel intervertebral disc height loss with spondylitic endplate changes. Essentially complete disc space effacement C3-C7 with spondylitic ridging, uncinate spurring, disc osteophyte complex formation hypertrophic facet arthropathy. Findings result in some chronic mild to moderate  canal stenosis C3-C7. Multilevel mild-to-moderate foraminal narrowing is present as well with more moderate to severe narrowing bilaterally at C3-4. Upper chest: Comminuted fracture of the medial head left clavicle with anterior displacement of the fracture fragment, surrounding hematoma and soft tissue swelling. Edema and swelling extends across the base of the left neck and superficial to the sternocleidomastoid. Distention and synovitis of the left subacromial/subdeltoid bursa, partially included within imaging. Other: Cervical carotid and vertebral artery atherosclerosis. Normal  thyroid. IMPRESSION: 1. No acute intracranial abnormality. 2. Chronic microvascular ischemic white matter disease and parenchymal volume loss. 3. Scattered lacunar type infarcts in the basal ganglia and right cerebellar hemisphere. 4. Several sites of left parieto-occipital scalp swelling including a site of laceration and crescentic scalp hematoma measuring up to 8 mm in maximal thickness. No subjacent calvarial fracture. 5. Subtotal opacification of the sphenoid sinuses with some mixed attenuation material including more high attenuation inspissated debris, can be seen in the setting of chronic allergic fungal sinusitis. 6. No evidence of acute fracture or traumatic listhesis of the cervical spine. 7. Multilevel cervical spondylitic changes, as above. 8. Comminuted fracture of the medial head left clavicle with anterior displacement of the proximal third clavicle, surrounding hematoma and soft tissue swelling. Edema and swelling extends across the base of the left neck and superficial to the sternocleidomastoid. 9. Distention and synovitis of the left subacromial/subdeltoid bursa, partially included within imaging. Possibly chronic. 10. Prior right cervical neck dissection and parotidectomy. 11. Intracranial and cervical atherosclerosis. Electronically Signed   By: Lovena Le M.D.   On: 06/15/2020 02:12   CT Angio Neck W and/or Wo Contrast  Result Date: 06/15/2020 CLINICAL DATA:  Initial evaluation for acute aphasia. EXAM: CT ANGIOGRAPHY HEAD AND NECK TECHNIQUE: Multidetector CT imaging of the head and neck was performed using the standard protocol during bolus administration of intravenous contrast. Multiplanar CT image reconstructions and MIPs were obtained to evaluate the vascular anatomy. Carotid stenosis measurements (when applicable) are obtained utilizing NASCET criteria, using the distal internal carotid diameter as the denominator. CONTRAST:  59m OMNIPAQUE IOHEXOL 350 MG/ML SOLN COMPARISON:   Comparison made with prior CTs from earlier the same day. FINDINGS: CTA NECK FINDINGS Aortic arch: Visualized aortic arch of normal caliber with normal 3 vessel morphology. Moderate atheromatous change about the arch and origin of the great vessels without hemodynamically significant stenosis. Subclavian arteries patent bilaterally. Right carotid system: Right CCA patent from its origin to the bifurcation without stenosis. No significant atheromatous narrowing about the right bifurcation. Diffuse atheromatous irregularity throughout the right ICA without significant stenosis, dissection or occlusion. Left carotid system: Left CCA patent from its origin to the bifurcation without stenosis. Eccentric calcified plaque at the left bifurcation/proximal left ICA without hemodynamically significant stenosis. Left ICA irregular but widely patent distally to the skull base without stenosis, dissection or occlusion. Vertebral arteries: Both vertebral arteries arise from subclavian arteries. No proximal subclavian artery stenosis. Left vertebral artery dominant. Atheromatous plaque about the origin of the left vertebral artery with estimated 50-70% stenosis. Dominant left vertebral artery otherwise patent within the neck. Right vertebral artery diffusely hypoplastic and is largely occluded within the neck. Skeleton: Mildly displaced comminuted fracture of the left clavicular head again noted. Other neck: Scattered soft tissue hematoma related to the left clavicular fracture present within the lower left neck. No visible active contrast extravasation. Sequelae of prior right-sided neck dissection. No visible mass or adenopathy. Upper chest: Better evaluated on concomitant CT of the chest. Review of the MIP images  confirms the above findings CTA HEAD FINDINGS Anterior circulation: Petrous segments widely patent bilaterally. Atheromatous plaque within the carotid siphons with moderate stenosis at the para clinoid ICAs  bilaterally. A1 segments patent. Right A1 hypoplastic. Normal anterior communicating artery complex. Both ACAs are diffusely irregular but remain perfused and patent to their distal aspects. No M1 stenosis or occlusion. Normal MCA bifurcations. Distal MCA branches well perfused and symmetric. Diffuse small vessel atheromatous irregularity. Posterior circulation: Focal plaque within the proximal dominant left V4 segment with short-segment mild stenosis. Left vertebral otherwise widely patent. Patent left PICA. Hypoplastic right vertebral artery largely occluded at the skull base. Faint retrograde filling of the distal right V4 segment with perfusion of the right PICA. Basilar widely patent to its distal aspect. Superior cerebellar arteries patent bilaterally. Left PCA primarily supplied via the basilar. Right PCA supplied via the basilar as well as a prominent right posterior communicating artery. Both PCAs are diffusely irregular but remain well perfused or distal aspects. Venous sinuses: Grossly patent allowing for timing the contrast bolus. Anatomic variants: None significant.  No aneurysm. Review of the MIP images confirms the above findings IMPRESSION: 1. Negative CTA for emergent large vessel occlusion. 2. Hypoplastic right vertebral artery largely occluded within the neck. Retrograde filling of the distal right V4 segment with perfusion of the right PICA. 3. 50-70% stenosis at the origin of the dominant left vertebral artery. Left vertebral artery otherwise widely patent within the neck. 4. Moderate short-segment atheromatous stenoses at the para clinoid ICAs bilaterally. 5. Mildly displaced comminuted left clavicular head fracture with associated soft tissue hematoma. No visible active contrast extravasation. 6. Sequelae of prior right-sided neck dissection. Electronically Signed   By: Jeannine Boga M.D.   On: 06/15/2020 04:09   CT CERVICAL SPINE WO CONTRAST  Result Date: 06/15/2020 CLINICAL DATA:   Fall EXAM: CT HEAD WITHOUT CONTRAST CT CERVICAL SPINE WITHOUT CONTRAST TECHNIQUE: Multidetector CT imaging of the head and cervical spine was performed following the standard protocol without intravenous contrast. Multiplanar CT image reconstructions of the cervical spine were also generated. COMPARISON:  Subsequent CT chest, abdomen and pelvis FINDINGS: CT HEAD FINDINGS Brain: Few scattered lacunar type infarcts in the basal ganglia and right cerebellar hemisphere. No evidence of acute infarction, hemorrhage, hydrocephalus, extra-axial collection, visible mass lesion or mass effect. Symmetric prominence of the ventricles, cisterns and sulci compatible with parenchymal volume loss. Patchy areas of white matter hypoattenuation are most compatible with chronic microvascular angiopathy. Vascular: Atherosclerotic calcification of the carotid siphons and intradural vertebral arteries. No hyperdense vessel. Skull: Several sites of left parieto-occipital scalp swelling including a site of laceration and crescentic scalp hematoma measuring up to 8 mm in maximal thickness. Additional mild right occipital soft calcium Ling as well. No calvarial fracture or sutural diastasis. Sinuses/Orbits: Postsurgical changes and chronic hyperostotic features the bilateral maxillary sinuses. Subtotal opacification of the sphenoid sinuses with some mixed attenuation material including more high attenuation inspissated debris. Additional postsurgical change from prior right mastoidectomy. Surgical change noted along the right superolateral orbit. Bilateral lens extractions. Included orbital contents are otherwise unremarkable. Other: Postsurgical changes from prior right parotidectomy. CT CERVICAL SPINE FINDINGS Alignment: Stabilization collar in place. Straightening and slight reversal of the upper cervical lordosis is similar to comparison CT imaging. Degenerative anterolisthesis C2 on 3 and C7 on T1 are similar to comparison imaging. No  evidence of traumatic listhesis. No abnormally widened, perched or jumped facets. Normal alignment of the craniocervical and atlantoaxial articulations accounting for advanced atlantodental Skull base  and vertebrae: The osseous structures appear diffusely demineralized which may limit detection of small or nondisplaced fractures. No acute skull base fracture. No vertebral body fracture or height loss. No suspicious lytic or blastic lesions. Extensive atlantodental and basion dens arthrosis with calcific pannus formation, nonspecific but can be seen with CPPD or rheumatoid arthropathy. Multilevel cervical spondylitic changes are present as well, better detailed below. Soft tissues and spinal canal: No pre or paravertebral fluid or swelling. No visible canal hematoma. Airways patent. Prior right cervical neck dissection and parotidectomy. Disc levels: Multilevel intervertebral disc height loss with spondylitic endplate changes. Essentially complete disc space effacement C3-C7 with spondylitic ridging, uncinate spurring, disc osteophyte complex formation hypertrophic facet arthropathy. Findings result in some chronic mild to moderate canal stenosis C3-C7. Multilevel mild-to-moderate foraminal narrowing is present as well with more moderate to severe narrowing bilaterally at C3-4. Upper chest: Comminuted fracture of the medial head left clavicle with anterior displacement of the fracture fragment, surrounding hematoma and soft tissue swelling. Edema and swelling extends across the base of the left neck and superficial to the sternocleidomastoid. Distention and synovitis of the left subacromial/subdeltoid bursa, partially included within imaging. Other: Cervical carotid and vertebral artery atherosclerosis. Normal thyroid. IMPRESSION: 1. No acute intracranial abnormality. 2. Chronic microvascular ischemic white matter disease and parenchymal volume loss. 3. Scattered lacunar type infarcts in the basal ganglia and right  cerebellar hemisphere. 4. Several sites of left parieto-occipital scalp swelling including a site of laceration and crescentic scalp hematoma measuring up to 8 mm in maximal thickness. No subjacent calvarial fracture. 5. Subtotal opacification of the sphenoid sinuses with some mixed attenuation material including more high attenuation inspissated debris, can be seen in the setting of chronic allergic fungal sinusitis. 6. No evidence of acute fracture or traumatic listhesis of the cervical spine. 7. Multilevel cervical spondylitic changes, as above. 8. Comminuted fracture of the medial head left clavicle with anterior displacement of the proximal third clavicle, surrounding hematoma and soft tissue swelling. Edema and swelling extends across the base of the left neck and superficial to the sternocleidomastoid. 9. Distention and synovitis of the left subacromial/subdeltoid bursa, partially included within imaging. Possibly chronic. 10. Prior right cervical neck dissection and parotidectomy. 11. Intracranial and cervical atherosclerosis. Electronically Signed   By: Lovena Le M.D.   On: 06/15/2020 02:12   MR BRAIN WO CONTRAST  Result Date: 06/15/2020 CLINICAL DATA:  Head trauma with abnormal mental status. Weakness and confusion. EXAM: MRI HEAD WITHOUT CONTRAST TECHNIQUE: Multiplanar, multiecho pulse sequences of the brain and surrounding structures were obtained without intravenous contrast. COMPARISON:  Head CT from earlier today FINDINGS: Brain: Scattered small areas of restricted diffusion along the high left frontal lobe and to an even lesser extent in the left frontal parietal cortex. Small focus of restricted diffusion at the splenium of the corpus callosum is an uncommon location for ischemia but is attributed to small perforator infarct in this setting. Confluent chronic small vessel ischemia in the cerebral white matter, mainly around the lateral ventricles. Small remote bilateral cerebellar  infarctions. Mild petechial hemorrhage associated with the largest left frontal infarct. There are a few remote microhemorrhages and chronic blood products in the inferior left cerebellum measuring 14 mm, a site with gliosis by CT since 2018 Vascular: Dominant left vertebral artery with no visible right vertebral artery. No flow void in the right sigmoid and upper internal jugular veins on axial T2 weighted imaging, attributed to flow artifact given normal appearance on coronal T2. Skull and upper  cervical spine: No focal marrow lesion is seen. Sinuses/Orbits: Sequela of chronic sinusitis with opacified postoperative left maxillary sinus, left sphenoid sinus, and right mastoid ectomy bulb. Mastoid opacification is long-standing. Bilateral cataract resection. Other: Scattered areas of scalp swelling and scarring. IMPRESSION: 1. Small, patchy acute infarcts in the left MCA to PCA distribution as described. Mild petechial hemorrhage at the largest acute infarct which is in the high left frontal lobe. 2. Chronic small vessel ischemia. 3. Sinus and right mastoidectomy opacifications. Electronically Signed   By: Monte Fantasia M.D.   On: 06/15/2020 05:18   DG Pelvis Portable  Result Date: 06/15/2020 CLINICAL DATA:  Unwitnessed fall EXAM: PORTABLE PELVIS 1-2 VIEWS COMPARISON:  02/11/2016 FINDINGS: Pins are seen within the proximal left femur related to remote injury. No acute fracture, subluxation or dislocation. Degenerative changes in the hips bilaterally and visualized lower lumbar spine. IMPRESSION: No acute bony abnormality. Electronically Signed   By: Rolm Baptise M.D.   On: 06/15/2020 00:53   CT CHEST ABDOMEN PELVIS W CONTRAST  Result Date: 06/15/2020 CLINICAL DATA:  Acute pain due to trauma EXAM: CT CHEST, ABDOMEN, AND PELVIS WITH CONTRAST TECHNIQUE: Multidetector CT imaging of the chest, abdomen and pelvis was performed following the standard protocol during bolus administration of intravenous contrast.  CONTRAST:  100 mL of Isovue 370. COMPARISON:  03/10/2014 CT chest.  CT of the pelvis dated 02/23/2015 FINDINGS: CT CHEST FINDINGS Cardiovascular: The heart size is enlarged. Aortic calcifications are noted. There is no evidence for thoracic aortic aneurysm. No evidence for thoracic aortic dissection. The pulmonary arteries are dilated. Chronic pulmonary emboli are noted involving the right lower lobe pulmonary artery (axial series 3, image 34). No definite evidence for an acute pulmonary embolism. There is no significant pericardial effusion. Coronary artery calcifications are noted. Mediastinum/Nodes: -- No mediastinal lymphadenopathy. -- No hilar lymphadenopathy. -- No axillary lymphadenopathy. -- No supraclavicular lymphadenopathy. -- Normal thyroid gland where visualized. -  Unremarkable esophagus. Lungs/Pleura: There is atelectasis at the lung bases. There are chronic areas of fibrosis at the lung bases bilaterally. There is no large pleural effusion. There is a pulmonary nodule at the left lung base measuring 1.3 cm (axial series 5, image 119). This appears to have slightly increased in size since 2016. There is no pneumothorax. Musculoskeletal: There is an acute, somewhat displaced fracture of the medial left clavicle near the sternoclavicular joint. There is surrounding soft tissue swelling. There are end-stage degenerative changes of the bilateral glenohumeral joints. There is an apparent old healed fracture of the sternal body. CT ABDOMEN PELVIS FINDINGS Hepatobiliary: The liver is normal. Status post cholecystectomy.There is no biliary ductal dilation. Pancreas: Normal contours without ductal dilatation. No peripancreatic fluid collection. Spleen: Unremarkable. Adrenals/Urinary Tract: --Adrenal glands: Unremarkable. --Right kidney/ureter: No hydronephrosis or radiopaque kidney stones. --Left kidney/ureter: There is a relatively stable calcified exophytic nodule arising from the lower pole of the left  kidney. --Urinary bladder: Unremarkable. Stomach/Bowel: --Stomach/Duodenum: No hiatal hernia or other gastric abnormality. Normal duodenal course and caliber. --Small bowel: Unremarkable. --Colon: Rectosigmoid diverticulosis without acute inflammation. --Appendix: Normal. Vascular/Lymphatic: Atherosclerotic calcification is present within the non-aneurysmal abdominal aorta, without hemodynamically significant stenosis. --No retroperitoneal lymphadenopathy. --No mesenteric lymphadenopathy. --No pelvic or inguinal lymphadenopathy. Reproductive: There is a large left-sided hydrocele. Other: No ascites or free air. The abdominal wall is normal. Musculoskeletal. There are degenerative changes throughout the visualized lumbar spine. The patient is status post prior left hip percutaneous pinning. IMPRESSION: 1. Acute, somewhat displaced fracture of the medial left clavicle  near the sternoclavicular joint. There is surrounding soft tissue swelling. No pneumothorax. 2. Chronic pulmonary emboli involving the right lower lobe pulmonary artery. No definite evidence for an acute pulmonary embolism. The central pulmonary arteries are dilated suggestive of pulmonary artery hypertension. 3. Cardiomegaly and coronary artery disease. 4. Chronic areas of fibrosis at the lung bases bilaterally. 5. There is a 1.3 cm pulmonary nodule in the left lower lobe which appears to have increased since prior studies. Consider one of the following in 3 months for both low-risk and high-risk individuals: (a) repeat chest CT, (b) follow-up PET-CT, or (c) tissue sampling. This recommendation follows the consensus statement: Guidelines for Management of Incidental Pulmonary Nodules Detected on CT Images: From the Fleischner Society 2017; Radiology 2017; 284:228-243. Aortic Atherosclerosis (ICD10-I70.0). Electronically Signed   By: Constance Holster M.D.   On: 06/15/2020 01:55   DG Chest Port 1 View  Result Date: 06/15/2020 CLINICAL DATA:   Unwitnessed fall, unresponsive EXAM: PORTABLE CHEST 1 VIEW COMPARISON:  02/11/2016 FINDINGS: Bibasilar airspace opacities, favor atelectasis. Heart is borderline in size. No visible effusions or pneumothorax. No acute bony abnormality. IMPRESSION: Bibasilar opacities, favor atelectasis Electronically Signed   By: Rolm Baptise M.D.   On: 06/15/2020 00:52   Family History Unable to obtain due to altered mental status.       Review of Systems Unable to obtain due to altered mental status.    OBJECTIVE  Vitals: Patient Vitals for the past 8 hrs:  BP Temp Temp src Pulse Resp SpO2 Height Weight  06/15/20 0700 (!) 98/55 - - (!) 44 (!) 21 100 % - -  06/15/20 0645 (!) 58/36 - - (!) 34 18 100 % - -  06/15/20 0619 (!) 97/51 - - - 16 100 % - -  06/15/20 0545 (!) 55/31 - - (!) 49 20 98 % - -  06/15/20 0530 (!) 38/29 - - - 19 - - -  06/15/20 0415 (!) 90/59 - - 81 15 100 % - -  06/15/20 0330 126/85 - - 98 15 100 % - -  06/15/20 0230 117/88 - - (!) 110 (!) 25 100 % - -  06/15/20 0200 122/86 - - 85 12 100 % - -  06/15/20 0115 99/69 - - 99 14 94 % - -  06/15/20 0045 105/88 - - 93 15 99 % - -  06/15/20 0032 - - - - - - _0  (1.778 m) 74 kg  06/15/20 0020 126/74 98.1 F (36.7 C) Temporal 88 18 98 % - -  06/15/20 0019 - - - - - 100 % - -  06/15/20 0015 126/74 - - - - - - -   General: appears comfortable, no acute distress, curled over laying on right side Cardiovascular: Low heart rate Respiratory: No cyanosis, no use of accessory musculature GI: No organomegaly, abdomen is soft and non-tender Skin: Superficial laceration - head Neurologic: Unable to obtain due to altered mental status. Psychiatric: Unable to obtain due to altered mental status. Lymphatic: No swelling obvious and reported other than the area involved in the exam below  Extremities  MSK: examination limited as patient is in extremis    Test Results Imaging Xray pelvis showing no acute bony abnormality  CT chest showing  mildly displaced fracture of the left medial clavicle  Labs cbc Recent Labs    06/15/20 0017 06/15/20 0032  WBC 17.6*  --   HGB 13.4 13.9  HCT 39.9 41.0  PLT 203  --  Labs inflam No results for input(s): CRP in the last 72 hours.  Invalid input(s): ESR  Labs coag Recent Labs    06/15/20 0017  INR 1.2    Recent Labs    06/15/20 0017 06/15/20 0032  NA 132* 133*  K 4.3 4.4  CL 95* 94*  CO2 25  --   GLUCOSE 133* 124*  BUN 17 21  CREATININE 0.91 0.70  CALCIUM 9.0  --      ASSESSMENT AND PLAN: 87 y.o. male with the following: left medial clavicle fracture  Patient appears to be in extremis. Patient is DNR/DNI. Patient is going to comfort care.   - Recommendations:   - sling only for comfort at this time.   - He is not required use of the sling while in comfort care.  - No further orthopedic surgical interventions.   - If appropriate, outpatient follow-up in 2-3 weeks for repeat imaging  Contact information: Dr. Ophelia Charter, Noemi Chapel, PA-C  [place], [place] 06/15/2020

## 2020-06-15 NOTE — ED Notes (Signed)
Notified by secretary, pts vitals have drastically changed. bp now 38/29, thready radial pulse, pt with agonal respirations. Dr. Randal Buba aware. Pt placed in [place]. Liter bolus given, placed back on NRB. Given fentanyl for comfort. Chaplain paged and staff prayed with patient for a peaceful transition.

## 2020-06-15 NOTE — ED Notes (Signed)
Attempted I&O cath. Unable to obtain urine. RN notified and aware.

## 2020-06-15 NOTE — ED Notes (Signed)
Daughters at bedside. States they are going to get lunch will call of any change [phone] ( {Patty )

## 2020-06-15 NOTE — ED Notes (Signed)
Pt resting comfortably in bed

## 2020-06-16 DIAGNOSIS — I482 Chronic atrial fibrillation, unspecified: Secondary | ICD-10-CM

## 2020-06-16 DIAGNOSIS — S42022A Displaced fracture of shaft of left clavicle, initial encounter for closed fracture: Secondary | ICD-10-CM

## 2020-06-16 DIAGNOSIS — R4182 Altered mental status, unspecified: Secondary | ICD-10-CM | POA: Diagnosis not present

## 2020-06-16 DIAGNOSIS — W19XXXA Unspecified fall, initial encounter: Secondary | ICD-10-CM | POA: Diagnosis not present

## 2020-06-16 DIAGNOSIS — R0989 Other specified symptoms and signs involving the circulatory and respiratory systems: Secondary | ICD-10-CM

## 2020-06-16 DIAGNOSIS — E039 Hypothyroidism, unspecified: Secondary | ICD-10-CM

## 2020-06-16 DIAGNOSIS — I10 Essential (primary) hypertension: Secondary | ICD-10-CM

## 2020-06-16 DIAGNOSIS — I639 Cerebral infarction, unspecified: Secondary | ICD-10-CM | POA: Diagnosis not present

## 2020-06-16 DIAGNOSIS — S42002A Fracture of unspecified part of left clavicle, initial encounter for closed fracture: Secondary | ICD-10-CM | POA: Diagnosis not present

## 2020-06-16 DIAGNOSIS — D472 Monoclonal gammopathy: Secondary | ICD-10-CM

## 2020-06-16 DIAGNOSIS — Y92129 Unspecified place in nursing home as the place of occurrence of the external cause: Secondary | ICD-10-CM

## 2020-06-16 MED ORDER — GLYCOPYRROLATE 0.2 MG/ML IJ SOLN: 0.2000 mg | INTRAMUSCULAR | Status: DC | PRN

## 2020-06-16 MED ORDER — MORPHINE SULFATE (CONCENTRATE) 10 MG/0.5ML PO SOLN: 5.0000 mg | ORAL | 0 refills | Status: DC | PRN

## 2020-06-16 MED ORDER — GLYCOPYRROLATE 1 MG PO TABS: 1.0000 mg | ORAL_TABLET | ORAL | 0 refills | Status: DC | PRN

## 2020-06-16 MED ORDER — MORPHINE SULFATE (CONCENTRATE) 10 MG/0.5ML PO SOLN: 5.0000 mg | ORAL | Status: DC | PRN

## 2020-06-16 MED ORDER — POLYVINYL ALCOHOL 1.4 % OP SOLN: 1.0000 [drp] | Freq: Four times a day (QID) | OPHTHALMIC | 0 refills | Status: DC | PRN

## 2020-06-16 MED ORDER — ACETAMINOPHEN 650 MG RE SUPP: 650.0000 mg | Freq: Four times a day (QID) | RECTAL | 0 refills | Status: DC | PRN

## 2020-06-16 MED ORDER — LORAZEPAM 1 MG PO TABS: 1.0000 mg | ORAL_TABLET | ORAL | Status: DC | PRN

## 2020-06-16 MED ORDER — GLYCOPYRROLATE 1 MG PO TABS
1.0000 mg | ORAL_TABLET | ORAL | Status: DC | PRN
  Filled 2020-06-16: qty 1

## 2020-06-16 MED ORDER — LORAZEPAM 2 MG/ML PO CONC: 1.0000 mg | ORAL | 0 refills | Status: DC | PRN

## 2020-06-16 MED ORDER — GLYCOPYRROLATE 0.2 MG/ML IJ SOLN
0.2000 mg | INTRAMUSCULAR | Status: DC | PRN
  Administered 2020-06-16: 0.2 mg via INTRAVENOUS
  Filled 2020-06-16: qty 1

## 2020-06-16 NOTE — Progress Notes (Signed)
Manufacturing engineer [facility])  Received request from [facility] for hospice services at home after discharge.  Chart and pt information under review by [facility] physician.  Hospice eligibility pending at this time.  Hospital liaison spoke with [name], pt's daughter, to initiate education related to hospice philosophy and services and to answer any questions at this time. [name] shares that her mother was cared for by hospice and that it was a wonderful experience.  Per discussion with Luetta Nutting, NP with PMT, the plan is to discharge home today by PTAR.    [place] send signed and completed DNR home with pt/family.  Please provide prescriptions at discharge as needed to ensure ongoing symptom management until patient can be admitted onto hospice services.   ACC information and contact numbers given to [facility].  Above information shared with Marijo Conception Manager.  Please call with any questions or concerns.  Thank you for the opportunity to participate in this pt's care.  Domenic Moras, BSN, RN [employer] [phone] (24h on call)

## 2020-06-16 NOTE — Progress Notes (Signed)
PROGRESS NOTE  Adrian West W[facility]:[id number] DOB: [birth year] DOA: 06/15/2020 PCP: [name], MD  HPI/Recap of past 24 hours: HPI from Dr Lorin Mercy T Paulus is a 87 y.o. male with medical history significant of salivary gland neoplasm; HLD; MGUS; dementia; afib; ILD; hypothyroidism; and HTN presenting after a fall at home. Pt was found slumped over his walker.  Has a lac to the back of his head.  He was tracking to voice at time of presentation, but has been obtunded with response only to painful stimuli. In the ED, noted to be hypotensive. Imaging showed a clavicle fracture, MRI showed acute infarct with mild petechial hemorrhage. Neurology and orthopedics were consulted. Due to overall very poor prognosis/pt not being responsive, palliative consulted, discussed with family, pt was DNR, switched to comfort care, anticipating hospital death.    Today, pt unresponsive to voice, appears comfortable   Assessment/Plan: Active Problems:   Hypothyroidism   Essential hypertension   MGUS (monoclonal gammopathy of unknown significance)   DNR (do not resuscitate) discussion   Acute CVA (cerebrovascular accident) ([place])   Fall at nursing home   Displaced fracture of shaft of left clavicle, initial encounter for closed fracture   Atrial fibrillation, chronic ([place])    Acute CVA MRI showed small, patchy acute infarcts in the left MCA to PCA distribution. Mild petechial hemorrhage  Likely 2/2 chronic A. fib, not on anticoagulation Due to unresponsiveness, poor prognosis, patient was transitioned to comfort care  Displaced fracture of left clavicle Likely 2/2 from fall 2/2 CVA Pain management, comfort care  Chronic A. Fib Rate controlled  Essential hypertension  MGUS  Hypothyroidism  Goals of care Due to overall poor prognosis, unresponsiveness, patient transitioned to comfort care Palliative care on board      Estimated body mass index is 23.41 kg/m as calculated from  the following:   Height as of this encounter: 5\' 10"  (1.778 m).   Weight as of this encounter: 74 kg.     Code Status: DNR  Family Communication: None at bedside  Disposition Plan: Status is: Inpatient  Remains inpatient appropriate because:Inpatient level of care appropriate due to severity of illness   Dispo: The patient is from: SNF              Anticipated d/c is to: Anticipate in hospital death              Anticipated d/c date is: Unknown              Patient currently is not medically stable to d/c.   Consultants:  Palliative care  Orthopedics  Neurology  Procedures:  None  Antimicrobials:  None  DVT prophylaxis: None   Objective: Vitals:   06/15/20 1100 06/15/20 1230 06/15/20 1315 06/15/20 2231  BP: 95/61 113/84 107/74 (!) 143/65  Pulse: 69 87 79 89  Resp: 20 18 18 16   Temp:    (!) 97.5 F (36.4 C)  TempSrc:    Tympanic  SpO2: 100% 100% 100% 92%  Weight:      Height:       No intake or output data in the 24 hours ending 06/16/20 0951 Filed Weights   06/15/20 0032  Weight: 74 kg    Exam:  General: NAD, unresponsive to voice, chronically ill-appearing  Cardiovascular: S1, S2 present  Respiratory: CTAB  Abdomen: Soft, nontender, nondistended, bowel sounds present  Musculoskeletal: No bilateral pedal edema noted  Skin:  Scattered ecchymosis across neck, left upper chest  Psychiatry:  Unable to assess    Data Reviewed: CBC: Recent Labs  Lab 06/15/20 0017 06/15/20 0032  WBC 17.6*  --   HGB 13.4 13.9  HCT 39.9 41.0  MCV 94.8  --   PLT 203  --    Basic Metabolic Panel: Recent Labs  Lab 06/15/20 0017 06/15/20 0032  NA 132* 133*  K 4.3 4.4  CL 95* 94*  CO2 25  --   GLUCOSE 133* 124*  BUN 17 21  CREATININE 0.91 0.70  CALCIUM 9.0  --    GFR: Estimated Creatinine Clearance: 63.4 mL/min (by C-G formula based on SCr of 0.7 mg/dL). Liver Function Tests: Recent Labs  Lab 06/15/20 0017  AST 28  ALT 16  ALKPHOS 63   BILITOT 1.6*  PROT 5.7*  ALBUMIN 3.6   No results for input(s): LIPASE, AMYLASE in the last 168 hours. Recent Labs  Lab 06/15/20 0909  AMMONIA 22   Coagulation Profile: Recent Labs  Lab 06/15/20 0017  INR 1.2   Cardiac Enzymes: No results for input(s): CKTOTAL, CKMB, CKMBINDEX, TROPONINI in the last 168 hours. BNP (last 3 results) No results for input(s): PROBNP in the last 8760 hours. HbA1C: No results for input(s): HGBA1C in the last 72 hours. CBG: No results for input(s): GLUCAP in the last 168 hours. Lipid Profile: No results for input(s): CHOL, HDL, LDLCALC, TRIG, CHOLHDL, LDLDIRECT in the last 72 hours. Thyroid Function Tests: Recent Labs    06/15/20 0900  TSH 3.512   Anemia Panel: No results for input(s): VITAMINB12, FOLATE, FERRITIN, TIBC, IRON, RETICCTPCT in the last 72 hours. Urine analysis:    Component Value Date/Time   COLORURINE YELLOW 02/12/2016 0630   APPEARANCEUR CLEAR 02/12/2016 0630   LABSPEC 1.015 02/12/2016 0630   PHURINE 6.5 02/12/2016 0630   GLUCOSEU NEGATIVE 02/12/2016 0630   HGBUR NEGATIVE 02/12/2016 0630   HGBUR negative 02/14/2008 0841   BILIRUBINUR NEGATIVE 02/12/2016 0630   BILIRUBINUR Neg 01/02/2012 1702   KETONESUR NEGATIVE 02/12/2016 0630   PROTEINUR NEGATIVE 02/12/2016 0630   UROBILINOGEN 0.2 10/27/2014 1214   NITRITE NEGATIVE 02/12/2016 0630   LEUKOCYTESUR NEGATIVE 02/12/2016 0630   Sepsis Labs: @LABRCNTIP (procalcitonin:4,lacticidven:4)  ) Recent Results (from the past 240 hour(s))  Resp Panel by RT-PCR (Flu A&B, Covid) Nasopharyngeal Swab     Status: None   Collection Time: 06/15/20 12:24 AM   Specimen: Nasopharyngeal Swab; Nasopharyngeal(NP) swabs in vial transport medium  Result Value Ref Range Status   SARS Coronavirus 2 by RT PCR NEGATIVE NEGATIVE Final    Comment: (NOTE) SARS-CoV-2 target nucleic acids are NOT DETECTED.  The SARS-CoV-2 RNA is generally detectable in upper respiratory specimens during the acute  phase of infection. The lowest concentration of SARS-CoV-2 viral copies this assay can detect is 138 copies/mL. A negative result does not preclude SARS-Cov-2 infection and should not be used as the sole basis for treatment or other patient management decisions. A negative result may occur with  improper specimen collection/handling, submission of specimen other than nasopharyngeal swab, presence of viral mutation(s) within the areas targeted by this assay, and inadequate number of viral copies(<138 copies/mL). A negative result must be combined with clinical observations, patient history, and epidemiological information. The expected result is Negative.  Fact Sheet for Patients:  EntrepreneurPulse.com.au  Fact Sheet for Healthcare Providers:  IncredibleEmployment.be  This test is no t yet approved or cleared by the [place] FDA and  has been authorized for detection and/or diagnosis of SARS-CoV-2 by FDA under an Emergency Use  Authorization (EUA). This EUA will remain  in effect (meaning this test can be used) for the duration of the COVID-19 declaration under Section 564(b)(1) of the Act, 21 U.S.C.section 360bbb-3(b)(1), unless the authorization is terminated  or revoked sooner.       Influenza A by PCR NEGATIVE NEGATIVE Final   Influenza B by PCR NEGATIVE NEGATIVE Final    Comment: (NOTE) The Xpert Xpress SARS-CoV-2/FLU/RSV plus assay is intended as an aid in the diagnosis of influenza from Nasopharyngeal swab specimens and should not be used as a sole basis for treatment. Nasal washings and aspirates are unacceptable for Xpert Xpress SARS-CoV-2/FLU/RSV testing.  Fact Sheet for Patients: EntrepreneurPulse.com.au  Fact Sheet for Healthcare Providers: IncredibleEmployment.be  This test is not yet approved or cleared by the [place] FDA and has been authorized for detection and/or diagnosis of  SARS-CoV-2 by FDA under an Emergency Use Authorization (EUA). This EUA will remain in effect (meaning this test can be used) for the duration of the COVID-19 declaration under Section 564(b)(1) of the Act, 21 U.S.C. section 360bbb-3(b)(1), unless the authorization is terminated or revoked.  Performed at [facility], [place] [address] [place], [place] [address]       Studies: No results found.  Scheduled Meds: . antiseptic oral rinse  15 mL Topical BID    Continuous Infusions:   LOS: 1 day     Alma Friendly, MD [facility]  If 7PM-7AM, please contact night-coverage [email or web address] 06/16/2020, 9:51 AM

## 2020-06-16 NOTE — TOC Progression Note (Signed)
Transition of Care [facility]) - Progression Note    Patient Details  Name: Adrian West MRN: [id number] Date of Birth: [birth year]  Transition of Care [facility]) CM/SW [place], [place] Phone Number: 06/16/2020, 1:13 PM  Clinical Narrative:     CSW spoke with Butch Penny with [facility] concerning disposition.  Butch Penny would prefer for pt to be discharge tomorrow so the the facility can get everything in place for pt.  Physician and NP Amy has been update.  TOC Team will continue to assist with disposition planning.       Expected Discharge Plan and Services           Expected Discharge Date: 06/16/20                                     Social Determinants of Health (SDOH) Interventions    Readmission Risk Interventions No flowsheet data found.

## 2020-06-16 NOTE — Progress Notes (Signed)
Pt discharged via PTAR to [facility] in stable condition and on O2 @ 2L.  AVS printed and sent with patient.  Report called to [place], [place].  Adrian West

## 2020-06-16 NOTE — Progress Notes (Signed)
Patient arrived to [facility] bed 22 from emergency department.Patient assisted to bed by nursing staff.Patient opens eyes when name called no verbal response noted.No family currently at bedside.Will continue to monitor.

## 2020-06-16 NOTE — Discharge Summary (Addendum)
Discharge Summary  Adrian SkeensBenjamin T Tomlin West:[id number]:[id number] DOB: June 22, 1936  PCP: Jarome MatinPaterson, Daniel, MD  Admit date: 06/15/2020 Discharge date: 06/16/2020  Time spent: 40 mins  Recommendations for Outpatient Follow-up:  1. Hospice- comfort care    Discharge Diagnoses:  Active Hospital Problems   Diagnosis Date Noted  . Acute CVA (cerebrovascular accident) (HCC) 06/15/2020  . Fall at nursing home 06/15/2020  . Displaced fracture of shaft of left clavicle, initial encounter for closed fracture 06/15/2020  . Atrial fibrillation, chronic (HCC) 06/15/2020  . DNR (do not resuscitate) discussion 07/22/2014  . MGUS (monoclonal gammopathy of unknown significance) 04/11/2013  . Hypothyroidism 02/14/2008  . Essential hypertension 02/14/2008    Resolved Hospital Problems  No resolved problems to display.    Discharge Condition: Poor  Diet recommendation: Comfort feeds if able  Vitals:   06/15/20 1315 06/15/20 2231  BP: 107/74 (!) 143/65  Pulse: 79 89  Resp: 18 16  Temp:  (!) 97.5 F (36.4 C)  SpO2: 100% 92%    History of present illness:  Adrian FriesBenjamin T Wardis a 87 y.o.malewith medical history significant ofsalivary gland neoplasm; HLD; MGUS; dementia; afib; ILD; hypothyroidism; and HTN presenting after a fall at home. Pt was found slumped over his walker. Has a lac to the back of his head. He was tracking to voice at time of presentation, but has been obtunded with response only to painful stimuli. In the ED, noted to be hypotensive. Imaging showed a clavicle fracture, MRI showed acute infarct with mild petechial hemorrhage. Neurology and orthopedics were consulted. Due to overall very poor prognosis/pt not being responsive, palliative consulted, discussed with family, pt was DNR, switched to comfort care, anticipating hospital death.    Earlier today, pt unresponsive to voice, appeared comfortable. Plan is to discharge him to [facility] with hospice services to continue comfort care  measures.      Hospital Course:  Active Problems:   Hypothyroidism   Essential hypertension   MGUS (monoclonal gammopathy of unknown significance)   DNR (do not resuscitate) discussion   Acute CVA (cerebrovascular accident) (HCC)   Fall at nursing home   Displaced fracture of shaft of left clavicle, initial encounter for closed fracture   Atrial fibrillation, chronic (HCC)   Acute CVA MRI showed small, patchy acute infarcts in the left MCA to PCA distribution. Mild petechial hemorrhage  Likely 2/2 chronic A. fib, not on anticoagulation Due to unresponsiveness, poor prognosis, patient was transitioned to comfort care  Displaced fracture of left clavicle Likely 2/2 from fall 2/2 CVA Pain management, comfort care  Chronic A. Fib Rate controlled  Essential hypertension  MGUS  Hypothyroidism  Goals of care Due to overall poor prognosis, unresponsiveness, patient transitioned to comfort care Plan is for hospice services/comfort care at SNF     Estimated body mass index is 23.41 kg/m as calculated from the following:   Height as of this encounter: 5\' 10"  (1.778 m).   Weight as of this encounter: 74 kg.    Procedures:  None  Consultations:  Palliative care  Orthopedics  Neurology   Discharge Exam: BP (!) 143/65 (BP Location: Left Arm)   Pulse 89   Temp (!) 97.5 F (36.4 C) (Tympanic)   Resp 16   Ht 5\' 10"  (1.778 m)   Wt 74 kg   SpO2 92%   BMI 23.41 kg/m     General:NAD, unresponsive to voice, chronically ill-appearing  Cardiovascular:S1, S2 present  Respiratory:CTAB  Abdomen:Soft, nontender, nondistended, bowel sounds present  Musculoskeletal: No  bilateral pedal edema noted  Skin: Scattered ecchymosis across neck, left upper chest  Psychiatry: Unable to assess    Discharge Instructions You were cared for by a hospitalist during your hospital stay. If you have any questions about your discharge medications or the care you  received while you were in the hospital after you are discharged, you can call the unit and asked to speak with the hospitalist on call if the hospitalist that took care of you is not available. Once you are discharged, your primary care physician will handle any further medical issues. Please note that NO REFILLS for any discharge medications will be authorized once you are discharged, as it is imperative that you return to your primary care physician (or establish a relationship with a primary care physician if you do not have one) for your aftercare needs so that they can reassess your need for medications and monitor your lab values.  Discharge Instructions    Diet - low sodium heart healthy   Complete by: As directed    Discharge wound care:   Complete by: As directed    None, comfort care   Increase activity slowly   Complete by: As directed      Allergies as of 06/16/2020      Reactions   Plavix [clopidogrel] Other (See Comments)   Cannot take med      Medication List    STOP taking these medications   amoxicillin 500 MG capsule Commonly known as: AMOXIL   aspirin EC 81 MG tablet   levothyroxine 75 MCG tablet Commonly known as: SYNTHROID   meclizine 25 MG tablet Commonly known as: ANTIVERT   methylphenidate 10 MG tablet Commonly known as: RITALIN   metoprolol succinate 25 MG 24 hr tablet Commonly known as: Toprol XL   SYSTANE ULTRA OP   Vitamin D (Ergocalciferol) 1.25 MG (50000 UNIT) Caps capsule Commonly known as: DRISDOL     TAKE these medications   acetaminophen 650 MG suppository Commonly known as: TYLENOL Place 1 suppository (650 mg total) rectally every 6 (six) hours as needed for mild pain (or Fever >/= 101).   glycopyrrolate 1 MG tablet Commonly known as: ROBINUL Take 1 tablet (1 mg total) by mouth every 4 (four) hours as needed (excessive/terminal).   LORazepam 2 MG/ML concentrated solution Commonly known as: ATIVAN Place 0.5 mLs (1 mg total)  under the tongue every hour as needed for anxiety, seizure, sedation or sleep.   morphine CONCENTRATE 10 MG/0.5ML Soln concentrated solution Take 0.25-0.5 mLs (5-10 mg total) by mouth every 2 (two) hours as needed for severe pain, moderate pain or shortness of breath (dyspnea, respiratory rate >25, increased work of breathing).   polyvinyl alcohol 1.4 % ophthalmic solution Commonly known as: LIQUIFILM TEARS Place 1 drop into both eyes 4 (four) times daily as needed for dry eyes.   Refresh Lacri-Lube Oint Place 1 Dose into the right eye at bedtime.            Discharge Care Instructions  (From admission, onward)         Start     Ordered   06/16/20 0000  Discharge wound care:       Comments: None, comfort care   06/16/20 1232         Allergies  Allergen Reactions  . Plavix [Clopidogrel] Other (See Comments)    Cannot take med    Contact information for after-discharge care    [facility] .   Service: Assisted Living Contact information: [address] [place] [phone]                   The results of significant diagnostics from this hospitalization (including imaging, microbiology, ancillary and laboratory) are listed below for reference.    Significant Diagnostic Studies: CT Angio Head W or Wo Contrast  Result Date: 06/15/2020 CLINICAL DATA:  Initial evaluation for acute aphasia. EXAM: CT ANGIOGRAPHY HEAD AND NECK TECHNIQUE: Multidetector CT imaging of the head and neck was performed using the standard protocol during bolus administration of intravenous contrast. Multiplanar CT image reconstructions and MIPs were obtained to evaluate the vascular anatomy. Carotid stenosis measurements (when applicable) are obtained utilizing NASCET criteria, using the distal internal carotid diameter as the denominator. CONTRAST:  75mL OMNIPAQUE IOHEXOL 350 MG/ML SOLN COMPARISON:  Comparison  made with prior CTs from earlier the same day. FINDINGS: CTA NECK FINDINGS Aortic arch: Visualized aortic arch of normal caliber with normal 3 vessel morphology. Moderate atheromatous change about the arch and origin of the great vessels without hemodynamically significant stenosis. Subclavian arteries patent bilaterally. Right carotid system: Right CCA patent from its origin to the bifurcation without stenosis. No significant atheromatous narrowing about the right bifurcation. Diffuse atheromatous irregularity throughout the right ICA without significant stenosis, dissection or occlusion. Left carotid system: Left CCA patent from its origin to the bifurcation without stenosis. Eccentric calcified plaque at the left bifurcation/proximal left ICA without hemodynamically significant stenosis. Left ICA irregular but widely patent distally to the skull base without stenosis, dissection or occlusion. Vertebral arteries: Both vertebral arteries arise from subclavian arteries. No proximal subclavian artery stenosis. Left vertebral artery dominant. Atheromatous plaque about the origin of the left vertebral artery with estimated 50-70% stenosis. Dominant left vertebral artery otherwise patent within the neck. Right vertebral artery diffusely hypoplastic and is largely occluded within the neck. Skeleton: Mildly displaced comminuted fracture of the left clavicular head again noted. Other neck: Scattered soft tissue hematoma related to the left clavicular fracture present within the lower left neck. No visible active contrast extravasation. Sequelae of prior right-sided neck dissection. No visible mass or adenopathy. Upper chest: Better evaluated on concomitant CT of the chest. Review of the MIP images confirms the above findings CTA HEAD FINDINGS Anterior circulation: Petrous segments widely patent bilaterally. Atheromatous plaque within the carotid siphons with moderate stenosis at the para clinoid ICAs bilaterally. A1  segments patent. Right A1 hypoplastic. Normal anterior communicating artery complex. Both ACAs are diffusely irregular but remain perfused and patent to their distal aspects. No M1 stenosis or occlusion. Normal MCA bifurcations. Distal MCA branches well perfused and symmetric. Diffuse small vessel atheromatous irregularity. Posterior circulation: Focal plaque within the proximal dominant left V4 segment with short-segment mild stenosis. Left vertebral otherwise widely patent. Patent left PICA. Hypoplastic right vertebral artery largely occluded at the skull base. Faint retrograde filling of the distal right V4 segment with perfusion of the right PICA. Basilar widely patent to its distal aspect. Superior cerebellar arteries patent bilaterally. Left PCA primarily supplied via the basilar. Right PCA supplied via the basilar as well as a prominent right posterior communicating artery. Both PCAs are diffusely irregular but remain well perfused or distal aspects. Venous sinuses: Grossly patent allowing for timing the contrast bolus. Anatomic variants: None significant.  No aneurysm. Review of the MIP images confirms the above findings IMPRESSION: 1. Negative CTA for emergent large vessel occlusion. 2. Hypoplastic right vertebral artery largely occluded  within the neck. Retrograde filling of the distal right V4 segment with perfusion of the right PICA. 3. 50-70% stenosis at the origin of the dominant left vertebral artery. Left vertebral artery otherwise widely patent within the neck. 4. Moderate short-segment atheromatous stenoses at the para clinoid ICAs bilaterally. 5. Mildly displaced comminuted left clavicular head fracture with associated soft tissue hematoma. No visible active contrast extravasation. 6. Sequelae of prior right-sided neck dissection. Electronically Signed   By: Jeannine Boga M.D.   On: 06/15/2020 04:09   CT HEAD WO CONTRAST  Result Date: 06/15/2020 CLINICAL DATA:  Fall EXAM: CT HEAD  WITHOUT CONTRAST CT CERVICAL SPINE WITHOUT CONTRAST TECHNIQUE: Multidetector CT imaging of the head and cervical spine was performed following the standard protocol without intravenous contrast. Multiplanar CT image reconstructions of the cervical spine were also generated. COMPARISON:  Subsequent CT chest, abdomen and pelvis FINDINGS: CT HEAD FINDINGS Brain: Few scattered lacunar type infarcts in the basal ganglia and right cerebellar hemisphere. No evidence of acute infarction, hemorrhage, hydrocephalus, extra-axial collection, visible mass lesion or mass effect. Symmetric prominence of the ventricles, cisterns and sulci compatible with parenchymal volume loss. Patchy areas of white matter hypoattenuation are most compatible with chronic microvascular angiopathy. Vascular: Atherosclerotic calcification of the carotid siphons and intradural vertebral arteries. No hyperdense vessel. Skull: Several sites of left parieto-occipital scalp swelling including a site of laceration and crescentic scalp hematoma measuring up to 8 mm in maximal thickness. Additional mild right occipital soft calcium Ling as well. No calvarial fracture or sutural diastasis. Sinuses/Orbits: Postsurgical changes and chronic hyperostotic features the bilateral maxillary sinuses. Subtotal opacification of the sphenoid sinuses with some mixed attenuation material including more high attenuation inspissated debris. Additional postsurgical change from prior right mastoidectomy. Surgical change noted along the right superolateral orbit. Bilateral lens extractions. Included orbital contents are otherwise unremarkable. Other: Postsurgical changes from prior right parotidectomy. CT CERVICAL SPINE FINDINGS Alignment: Stabilization collar in place. Straightening and slight reversal of the upper cervical lordosis is similar to comparison CT imaging. Degenerative anterolisthesis C2 on 3 and C7 on T1 are similar to comparison imaging. No evidence of traumatic  listhesis. No abnormally widened, perched or jumped facets. Normal alignment of the craniocervical and atlantoaxial articulations accounting for advanced atlantodental Skull base and vertebrae: The osseous structures appear diffusely demineralized which may limit detection of small or nondisplaced fractures. No acute skull base fracture. No vertebral body fracture or height loss. No suspicious lytic or blastic lesions. Extensive atlantodental and basion dens arthrosis with calcific pannus formation, nonspecific but can be seen with CPPD or rheumatoid arthropathy. Multilevel cervical spondylitic changes are present as well, better detailed below. Soft tissues and spinal canal: No pre or paravertebral fluid or swelling. No visible canal hematoma. Airways patent. Prior right cervical neck dissection and parotidectomy. Disc levels: Multilevel intervertebral disc height loss with spondylitic endplate changes. Essentially complete disc space effacement C3-C7 with spondylitic ridging, uncinate spurring, disc osteophyte complex formation hypertrophic facet arthropathy. Findings result in some chronic mild to moderate canal stenosis C3-C7. Multilevel mild-to-moderate foraminal narrowing is present as well with more moderate to severe narrowing bilaterally at C3-4. Upper chest: Comminuted fracture of the medial head left clavicle with anterior displacement of the fracture fragment, surrounding hematoma and soft tissue swelling. Edema and swelling extends across the base of the left neck and superficial to the sternocleidomastoid. Distention and synovitis of the left subacromial/subdeltoid bursa, partially included within imaging. Other: Cervical carotid and vertebral artery atherosclerosis. Normal thyroid. IMPRESSION: 1. No acute  intracranial abnormality. 2. Chronic microvascular ischemic white matter disease and parenchymal volume loss. 3. Scattered lacunar type infarcts in the basal ganglia and right cerebellar hemisphere.  4. Several sites of left parieto-occipital scalp swelling including a site of laceration and crescentic scalp hematoma measuring up to 8 mm in maximal thickness. No subjacent calvarial fracture. 5. Subtotal opacification of the sphenoid sinuses with some mixed attenuation material including more high attenuation inspissated debris, can be seen in the setting of chronic allergic fungal sinusitis. 6. No evidence of acute fracture or traumatic listhesis of the cervical spine. 7. Multilevel cervical spondylitic changes, as above. 8. Comminuted fracture of the medial head left clavicle with anterior displacement of the proximal third clavicle, surrounding hematoma and soft tissue swelling. Edema and swelling extends across the base of the left neck and superficial to the sternocleidomastoid. 9. Distention and synovitis of the left subacromial/subdeltoid bursa, partially included within imaging. Possibly chronic. 10. Prior right cervical neck dissection and parotidectomy. 11. Intracranial and cervical atherosclerosis. Electronically Signed   By: Lovena Le M.D.   On: 06/15/2020 02:12   CT Angio Neck W and/or Wo Contrast  Result Date: 06/15/2020 CLINICAL DATA:  Initial evaluation for acute aphasia. EXAM: CT ANGIOGRAPHY HEAD AND NECK TECHNIQUE: Multidetector CT imaging of the head and neck was performed using the standard protocol during bolus administration of intravenous contrast. Multiplanar CT image reconstructions and MIPs were obtained to evaluate the vascular anatomy. Carotid stenosis measurements (when applicable) are obtained utilizing NASCET criteria, using the distal internal carotid diameter as the denominator. CONTRAST:  71mL OMNIPAQUE IOHEXOL 350 MG/ML SOLN COMPARISON:  Comparison made with prior CTs from earlier the same day. FINDINGS: CTA NECK FINDINGS Aortic arch: Visualized aortic arch of normal caliber with normal 3 vessel morphology. Moderate atheromatous change about the arch and origin of the  great vessels without hemodynamically significant stenosis. Subclavian arteries patent bilaterally. Right carotid system: Right CCA patent from its origin to the bifurcation without stenosis. No significant atheromatous narrowing about the right bifurcation. Diffuse atheromatous irregularity throughout the right ICA without significant stenosis, dissection or occlusion. Left carotid system: Left CCA patent from its origin to the bifurcation without stenosis. Eccentric calcified plaque at the left bifurcation/proximal left ICA without hemodynamically significant stenosis. Left ICA irregular but widely patent distally to the skull base without stenosis, dissection or occlusion. Vertebral arteries: Both vertebral arteries arise from subclavian arteries. No proximal subclavian artery stenosis. Left vertebral artery dominant. Atheromatous plaque about the origin of the left vertebral artery with estimated 50-70% stenosis. Dominant left vertebral artery otherwise patent within the neck. Right vertebral artery diffusely hypoplastic and is largely occluded within the neck. Skeleton: Mildly displaced comminuted fracture of the left clavicular head again noted. Other neck: Scattered soft tissue hematoma related to the left clavicular fracture present within the lower left neck. No visible active contrast extravasation. Sequelae of prior right-sided neck dissection. No visible mass or adenopathy. Upper chest: Better evaluated on concomitant CT of the chest. Review of the MIP images confirms the above findings CTA HEAD FINDINGS Anterior circulation: Petrous segments widely patent bilaterally. Atheromatous plaque within the carotid siphons with moderate stenosis at the para clinoid ICAs bilaterally. A1 segments patent. Right A1 hypoplastic. Normal anterior communicating artery complex. Both ACAs are diffusely irregular but remain perfused and patent to their distal aspects. No M1 stenosis or occlusion. Normal MCA bifurcations.  Distal MCA branches well perfused and symmetric. Diffuse small vessel atheromatous irregularity. Posterior circulation: Focal plaque within the proximal dominant left  V4 segment with short-segment mild stenosis. Left vertebral otherwise widely patent. Patent left PICA. Hypoplastic right vertebral artery largely occluded at the skull base. Faint retrograde filling of the distal right V4 segment with perfusion of the right PICA. Basilar widely patent to its distal aspect. Superior cerebellar arteries patent bilaterally. Left PCA primarily supplied via the basilar. Right PCA supplied via the basilar as well as a prominent right posterior communicating artery. Both PCAs are diffusely irregular but remain well perfused or distal aspects. Venous sinuses: Grossly patent allowing for timing the contrast bolus. Anatomic variants: None significant.  No aneurysm. Review of the MIP images confirms the above findings IMPRESSION: 1. Negative CTA for emergent large vessel occlusion. 2. Hypoplastic right vertebral artery largely occluded within the neck. Retrograde filling of the distal right V4 segment with perfusion of the right PICA. 3. 50-70% stenosis at the origin of the dominant left vertebral artery. Left vertebral artery otherwise widely patent within the neck. 4. Moderate short-segment atheromatous stenoses at the para clinoid ICAs bilaterally. 5. Mildly displaced comminuted left clavicular head fracture with associated soft tissue hematoma. No visible active contrast extravasation. 6. Sequelae of prior right-sided neck dissection. Electronically Signed   By: Jeannine Boga M.D.   On: 06/15/2020 04:09   CT CERVICAL SPINE WO CONTRAST  Result Date: 06/15/2020 CLINICAL DATA:  Fall EXAM: CT HEAD WITHOUT CONTRAST CT CERVICAL SPINE WITHOUT CONTRAST TECHNIQUE: Multidetector CT imaging of the head and cervical spine was performed following the standard protocol without intravenous contrast. Multiplanar CT image  reconstructions of the cervical spine were also generated. COMPARISON:  Subsequent CT chest, abdomen and pelvis FINDINGS: CT HEAD FINDINGS Brain: Few scattered lacunar type infarcts in the basal ganglia and right cerebellar hemisphere. No evidence of acute infarction, hemorrhage, hydrocephalus, extra-axial collection, visible mass lesion or mass effect. Symmetric prominence of the ventricles, cisterns and sulci compatible with parenchymal volume loss. Patchy areas of white matter hypoattenuation are most compatible with chronic microvascular angiopathy. Vascular: Atherosclerotic calcification of the carotid siphons and intradural vertebral arteries. No hyperdense vessel. Skull: Several sites of left parieto-occipital scalp swelling including a site of laceration and crescentic scalp hematoma measuring up to 8 mm in maximal thickness. Additional mild right occipital soft calcium Ling as well. No calvarial fracture or sutural diastasis. Sinuses/Orbits: Postsurgical changes and chronic hyperostotic features the bilateral maxillary sinuses. Subtotal opacification of the sphenoid sinuses with some mixed attenuation material including more high attenuation inspissated debris. Additional postsurgical change from prior right mastoidectomy. Surgical change noted along the right superolateral orbit. Bilateral lens extractions. Included orbital contents are otherwise unremarkable. Other: Postsurgical changes from prior right parotidectomy. CT CERVICAL SPINE FINDINGS Alignment: Stabilization collar in place. Straightening and slight reversal of the upper cervical lordosis is similar to comparison CT imaging. Degenerative anterolisthesis C2 on 3 and C7 on T1 are similar to comparison imaging. No evidence of traumatic listhesis. No abnormally widened, perched or jumped facets. Normal alignment of the craniocervical and atlantoaxial articulations accounting for advanced atlantodental Skull base and vertebrae: The osseous  structures appear diffusely demineralized which may limit detection of small or nondisplaced fractures. No acute skull base fracture. No vertebral body fracture or height loss. No suspicious lytic or blastic lesions. Extensive atlantodental and basion dens arthrosis with calcific pannus formation, nonspecific but can be seen with CPPD or rheumatoid arthropathy. Multilevel cervical spondylitic changes are present as well, better detailed below. Soft tissues and spinal canal: No pre or paravertebral fluid or swelling. No visible canal hematoma. Airways patent.  Prior right cervical neck dissection and parotidectomy. Disc levels: Multilevel intervertebral disc height loss with spondylitic endplate changes. Essentially complete disc space effacement C3-C7 with spondylitic ridging, uncinate spurring, disc osteophyte complex formation hypertrophic facet arthropathy. Findings result in some chronic mild to moderate canal stenosis C3-C7. Multilevel mild-to-moderate foraminal narrowing is present as well with more moderate to severe narrowing bilaterally at C3-4. Upper chest: Comminuted fracture of the medial head left clavicle with anterior displacement of the fracture fragment, surrounding hematoma and soft tissue swelling. Edema and swelling extends across the base of the left neck and superficial to the sternocleidomastoid. Distention and synovitis of the left subacromial/subdeltoid bursa, partially included within imaging. Other: Cervical carotid and vertebral artery atherosclerosis. Normal thyroid. IMPRESSION: 1. No acute intracranial abnormality. 2. Chronic microvascular ischemic white matter disease and parenchymal volume loss. 3. Scattered lacunar type infarcts in the basal ganglia and right cerebellar hemisphere. 4. Several sites of left parieto-occipital scalp swelling including a site of laceration and crescentic scalp hematoma measuring up to 8 mm in maximal thickness. No subjacent calvarial fracture. 5. Subtotal  opacification of the sphenoid sinuses with some mixed attenuation material including more high attenuation inspissated debris, can be seen in the setting of chronic allergic fungal sinusitis. 6. No evidence of acute fracture or traumatic listhesis of the cervical spine. 7. Multilevel cervical spondylitic changes, as above. 8. Comminuted fracture of the medial head left clavicle with anterior displacement of the proximal third clavicle, surrounding hematoma and soft tissue swelling. Edema and swelling extends across the base of the left neck and superficial to the sternocleidomastoid. 9. Distention and synovitis of the left subacromial/subdeltoid bursa, partially included within imaging. Possibly chronic. 10. Prior right cervical neck dissection and parotidectomy. 11. Intracranial and cervical atherosclerosis. Electronically Signed   By: Lovena Le M.D.   On: 06/15/2020 02:12   MR BRAIN WO CONTRAST  Result Date: 06/15/2020 CLINICAL DATA:  Head trauma with abnormal mental status. Weakness and confusion. EXAM: MRI HEAD WITHOUT CONTRAST TECHNIQUE: Multiplanar, multiecho pulse sequences of the brain and surrounding structures were obtained without intravenous contrast. COMPARISON:  Head CT from earlier today FINDINGS: Brain: Scattered small areas of restricted diffusion along the high left frontal lobe and to an even lesser extent in the left frontal parietal cortex. Small focus of restricted diffusion at the splenium of the corpus callosum is an uncommon location for ischemia but is attributed to small perforator infarct in this setting. Confluent chronic small vessel ischemia in the cerebral white matter, mainly around the lateral ventricles. Small remote bilateral cerebellar infarctions. Mild petechial hemorrhage associated with the largest left frontal infarct. There are a few remote microhemorrhages and chronic blood products in the inferior left cerebellum measuring 14 mm, a site with gliosis by CT since 2018  Vascular: Dominant left vertebral artery with no visible right vertebral artery. No flow void in the right sigmoid and upper internal jugular veins on axial T2 weighted imaging, attributed to flow artifact given normal appearance on coronal T2. Skull and upper cervical spine: No focal marrow lesion is seen. Sinuses/Orbits: Sequela of chronic sinusitis with opacified postoperative left maxillary sinus, left sphenoid sinus, and right mastoid ectomy bulb. Mastoid opacification is long-standing. Bilateral cataract resection. Other: Scattered areas of scalp swelling and scarring. IMPRESSION: 1. Small, patchy acute infarcts in the left MCA to PCA distribution as described. Mild petechial hemorrhage at the largest acute infarct which is in the high left frontal lobe. 2. Chronic small vessel ischemia. 3. Sinus and right mastoidectomy opacifications.  Electronically Signed   By: Monte Fantasia M.D.   On: 06/15/2020 05:18   DG Pelvis Portable  Result Date: 06/15/2020 CLINICAL DATA:  Unwitnessed fall EXAM: PORTABLE PELVIS 1-2 VIEWS COMPARISON:  02/11/2016 FINDINGS: Pins are seen within the proximal left femur related to remote injury. No acute fracture, subluxation or dislocation. Degenerative changes in the hips bilaterally and visualized lower lumbar spine. IMPRESSION: No acute bony abnormality. Electronically Signed   By: Rolm Baptise M.D.   On: 06/15/2020 00:53   CT CHEST ABDOMEN PELVIS W CONTRAST  Result Date: 06/15/2020 CLINICAL DATA:  Acute pain due to trauma EXAM: CT CHEST, ABDOMEN, AND PELVIS WITH CONTRAST TECHNIQUE: Multidetector CT imaging of the chest, abdomen and pelvis was performed following the standard protocol during bolus administration of intravenous contrast. CONTRAST:  100 mL of Isovue 370. COMPARISON:  03/10/2014 CT chest.  CT of the pelvis dated 02/23/2015 FINDINGS: CT CHEST FINDINGS Cardiovascular: The heart size is enlarged. Aortic calcifications are noted. There is no evidence for thoracic  aortic aneurysm. No evidence for thoracic aortic dissection. The pulmonary arteries are dilated. Chronic pulmonary emboli are noted involving the right lower lobe pulmonary artery (axial series 3, image 34). No definite evidence for an acute pulmonary embolism. There is no significant pericardial effusion. Coronary artery calcifications are noted. Mediastinum/Nodes: -- No mediastinal lymphadenopathy. -- No hilar lymphadenopathy. -- No axillary lymphadenopathy. -- No supraclavicular lymphadenopathy. -- Normal thyroid gland where visualized. -  Unremarkable esophagus. Lungs/Pleura: There is atelectasis at the lung bases. There are chronic areas of fibrosis at the lung bases bilaterally. There is no large pleural effusion. There is a pulmonary nodule at the left lung base measuring 1.3 cm (axial series 5, image 119). This appears to have slightly increased in size since 2016. There is no pneumothorax. Musculoskeletal: There is an acute, somewhat displaced fracture of the medial left clavicle near the sternoclavicular joint. There is surrounding soft tissue swelling. There are end-stage degenerative changes of the bilateral glenohumeral joints. There is an apparent old healed fracture of the sternal body. CT ABDOMEN PELVIS FINDINGS Hepatobiliary: The liver is normal. Status post cholecystectomy.There is no biliary ductal dilation. Pancreas: Normal contours without ductal dilatation. No peripancreatic fluid collection. Spleen: Unremarkable. Adrenals/Urinary Tract: --Adrenal glands: Unremarkable. --Right kidney/ureter: No hydronephrosis or radiopaque kidney stones. --Left kidney/ureter: There is a relatively stable calcified exophytic nodule arising from the lower pole of the left kidney. --Urinary bladder: Unremarkable. Stomach/Bowel: --Stomach/Duodenum: No hiatal hernia or other gastric abnormality. Normal duodenal course and caliber. --Small bowel: Unremarkable. --Colon: Rectosigmoid diverticulosis without acute  inflammation. --Appendix: Normal. Vascular/Lymphatic: Atherosclerotic calcification is present within the non-aneurysmal abdominal aorta, without hemodynamically significant stenosis. --No retroperitoneal lymphadenopathy. --No mesenteric lymphadenopathy. --No pelvic or inguinal lymphadenopathy. Reproductive: There is a large left-sided hydrocele. Other: No ascites or free air. The abdominal wall is normal. Musculoskeletal. There are degenerative changes throughout the visualized lumbar spine. The patient is status post prior left hip percutaneous pinning. IMPRESSION: 1. Acute, somewhat displaced fracture of the medial left clavicle near the sternoclavicular joint. There is surrounding soft tissue swelling. No pneumothorax. 2. Chronic pulmonary emboli involving the right lower lobe pulmonary artery. No definite evidence for an acute pulmonary embolism. The central pulmonary arteries are dilated suggestive of pulmonary artery hypertension. 3. Cardiomegaly and coronary artery disease. 4. Chronic areas of fibrosis at the lung bases bilaterally. 5. There is a 1.3 cm pulmonary nodule in the left lower lobe which appears to have increased since prior studies. Consider one of the following  in 3 months for both low-risk and high-risk individuals: (a) repeat chest CT, (b) follow-up PET-CT, or (c) tissue sampling. This recommendation follows the consensus statement: Guidelines for Management of Incidental Pulmonary Nodules Detected on CT Images: From the Fleischner Society 2017; Radiology 2017; 284:228-243. Aortic Atherosclerosis (ICD10-I70.0). Electronically Signed   By: Constance Holster M.D.   On: 06/15/2020 01:55   DG Chest Port 1 View  Result Date: 06/15/2020 CLINICAL DATA:  Unwitnessed fall, unresponsive EXAM: PORTABLE CHEST 1 VIEW COMPARISON:  02/11/2016 FINDINGS: Bibasilar airspace opacities, favor atelectasis. Heart is borderline in size. No visible effusions or pneumothorax. No acute bony abnormality.  IMPRESSION: Bibasilar opacities, favor atelectasis Electronically Signed   By: Rolm Baptise M.D.   On: 06/15/2020 00:52    Microbiology: Recent Results (from the past 240 hour(s))  Resp Panel by RT-PCR (Flu A&B, Covid) Nasopharyngeal Swab     Status: None   Collection Time: 06/15/20 12:24 AM   Specimen: Nasopharyngeal Swab; Nasopharyngeal(NP) swabs in vial transport medium  Result Value Ref Range Status   SARS Coronavirus 2 by RT PCR NEGATIVE NEGATIVE Final    Comment: (NOTE) SARS-CoV-2 target nucleic acids are NOT DETECTED.  The SARS-CoV-2 RNA is generally detectable in upper respiratory specimens during the acute phase of infection. The lowest concentration of SARS-CoV-2 viral copies this assay can detect is 138 copies/mL. A negative result does not preclude SARS-Cov-2 infection and should not be used as the sole basis for treatment or other patient management decisions. A negative result may occur with  improper specimen collection/handling, submission of specimen other than nasopharyngeal swab, presence of viral mutation(s) within the areas targeted by this assay, and inadequate number of viral copies(<138 copies/mL). A negative result must be combined with clinical observations, patient history, and epidemiological information. The expected result is Negative.  Fact Sheet for Patients:  EntrepreneurPulse.com.au  Fact Sheet for Healthcare Providers:  IncredibleEmployment.be  This test is no t yet approved or cleared by the [place] FDA and  has been authorized for detection and/or diagnosis of SARS-CoV-2 by FDA under an Emergency Use Authorization (EUA). This EUA will remain  in effect (meaning this test can be used) for the duration of the COVID-19 declaration under Section 564(b)(1) of the Act, 21 U.S.C.section 360bbb-3(b)(1), unless the authorization is terminated  or revoked sooner.       Influenza A by PCR NEGATIVE NEGATIVE  Final   Influenza B by PCR NEGATIVE NEGATIVE Final    Comment: (NOTE) The Xpert Xpress SARS-CoV-2/FLU/RSV plus assay is intended as an aid in the diagnosis of influenza from Nasopharyngeal swab specimens and should not be used as a sole basis for treatment. Nasal washings and aspirates are unacceptable for Xpert Xpress SARS-CoV-2/FLU/RSV testing.  Fact Sheet for Patients: EntrepreneurPulse.com.au  Fact Sheet for Healthcare Providers: IncredibleEmployment.be  This test is not yet approved or cleared by the [place] FDA and has been authorized for detection and/or diagnosis of SARS-CoV-2 by FDA under an Emergency Use Authorization (EUA). This EUA will remain in effect (meaning this test can be used) for the duration of the COVID-19 declaration under Section 564(b)(1) of the Act, 21 U.S.C. section 360bbb-3(b)(1), unless the authorization is terminated or revoked.  Performed at [facility], [place] [address], [place], [place] [address]      Labs: Basic Metabolic Panel: Recent Labs  Lab 06/15/20 0017 06/15/20 0032  NA 132* 133*  K 4.3 4.4  CL 95* 94*  CO2 25  --   GLUCOSE 133*  124*  BUN 17 21  CREATININE 0.91 0.70  CALCIUM 9.0  --    Liver Function Tests: Recent Labs  Lab 06/15/20 0017  AST 28  ALT 16  ALKPHOS 63  BILITOT 1.6*  PROT 5.7*  ALBUMIN 3.6   No results for input(s): LIPASE, AMYLASE in the last 168 hours. Recent Labs  Lab 06/15/20 0909  AMMONIA 22   CBC: Recent Labs  Lab 06/15/20 0017 06/15/20 0032  WBC 17.6*  --   HGB 13.4 13.9  HCT 39.9 41.0  MCV 94.8  --   PLT 203  --    Cardiac Enzymes: No results for input(s): CKTOTAL, CKMB, CKMBINDEX, TROPONINI in the last 168 hours. BNP: BNP (last 3 results) No results for input(s): BNP in the last 8760 hours.  ProBNP (last 3 results) No results for input(s): PROBNP in the last 8760 hours.  CBG: No results for input(s): GLUCAP in the last 168  hours.     Signed:  Alma Friendly, MD [facility] 06/16/2020, 3:50 PM

## 2020-06-16 NOTE — NC FL2 (Signed)
[place] LEVEL OF CARE SCREENING TOOL     IDENTIFICATION  Patient Name: Torben Soloway [place] Birthdate: 17-Feb-1928 Sex: male Admission Date (Current Location): 06/15/2020  [facility] and [place] Number:  Herbalist and Address:  [facility], [place] [address], [place], [place] [address]      Provider Number: [id number]  Attending Physician Name and Address:  Alma Friendly, MD  Relative Name and Phone Number:  [name] [employer] [phone]    Current Level of Care: Hospital Recommended Level of Care: [place] (Home with Hospice) Prior Approval Number:    Date Approved/Denied:   PASRR Number:    Discharge Plan: SNF (Home with Hospice)    Current Diagnoses: Patient Active Problem List   Diagnosis Date Noted  . Acute CVA (cerebrovascular accident) ([place]) 06/15/2020  . Fall at nursing home 06/15/2020  . Displaced fracture of shaft of left clavicle, initial encounter for closed fracture 06/15/2020  . Atrial fibrillation, chronic ([place]) 06/15/2020  . Right-sided Bell's palsy 02/21/2016  . Sinus bradycardia 02/21/2016  . Malnutrition of moderate degree 02/12/2016  . Fracture of femoral neck, left ([place]) 02/11/2016  . Closed left hip fracture ([place]) 02/11/2016  . Dizziness 12/31/2015  . Exertional dyspnea 12/31/2015  . Ectropion due to laxity of right eyelid 04/20/2015  . Obstructive chronic bronchitis with exacerbation ([place]) 11/13/2014  . Orthostatic hypotension 10/29/2014  . Protein-calorie malnutrition, severe ([place]) 10/28/2014  . Hypotension 10/27/2014  . SCCA (squamous cell carcinoma) of skin 10/27/2014  . Anorexia 08/16/2014  . DNR (do not resuscitate) discussion 07/22/2014  . Loss of weight 07/22/2014  . 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 Polycythemia, secondary 01/10/2014  . 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 MGUS (monoclonal  gammopathy of unknown significance) 04/11/2013  . Other and unspecified hyperlipidemia 02/24/2012  . PVC's (premature ventricular contractions) 04/05/2011  . TIA (transient ischemic attack) 10/12/2010  . SPINAL STENOSIS, LUMBAR 07/28/2010  . Esophageal stricture 10/27/2008  . DYSPHAGIA 10/27/2008  . Hypothyroidism 02/14/2008  . MITRAL REGURGITATION 02/14/2008  . Essential hypertension 02/14/2008  . GERD 02/14/2008    Orientation RESPIRATION BLADDER Height & Weight      (Unable to Assess)  Normal Incontinent Weight: 163 lb 2.3 oz (74 kg) Height:  5\' 10"  (177.8 cm)  BEHAVIORAL SYMPTOMS/MOOD NEUROLOGICAL BOWEL NUTRITION STATUS      Incontinent Diet (Comfort Care)  AMBULATORY STATUS COMMUNICATION OF NEEDS Skin   Total Care Does not communicate Surgical wounds (L head and arm incisions w/ foam dressing)                       Personal Care Assistance Level of Assistance  Bathing,Feeding,Dressing,Total care Bathing Assistance: Maximum assistance Feeding assistance: Maximum assistance Dressing Assistance: Maximum assistance Total Care Assistance: Maximum assistance   Functional Limitations Info  Sight,Hearing,Speech Sight Info:  (Unable to Assess) Hearing Info:  (Unable to Assess) Speech Info:  (Unable to Assess)    SPECIAL CARE FACTORS FREQUENCY                       Contractures Contractures Info: Not present    Additional Factors Info  Code Status,Allergies Code Status Info: DNR Allergies Info: Plavix           Current Medications (06/16/2020):  This is the current hospital active medication list Current Facility-Administered Medications  Medication Dose Route Frequency Provider Last Rate Last  Admin  . acetaminophen (TYLENOL) tablet 650 mg  650 mg Oral Q6H PRN Karmen Bongo, MD       Or  . acetaminophen (TYLENOL) suppository 650 mg  650 mg Rectal Q6H PRN Karmen Bongo, MD      . antiseptic oral rinse (BIOTENE) solution 15 mL  15 mL Topical BID  Elmer Picker M, NP   15 mL at 06/16/20 1236  . glycopyrrolate (ROBINUL) tablet 1 mg  1 mg Oral Q4H PRN Alma Friendly, MD       Or  . glycopyrrolate (ROBINUL) injection 0.2 mg  0.2 mg Subcutaneous Q4H PRN Alma Friendly, MD       Or  . glycopyrrolate (ROBINUL) injection 0.2 mg  0.2 mg Intravenous Q4H PRN Alma Friendly, MD      . haloperidol (HALDOL) tablet 2 mg  2 mg Oral Q6H PRN Lin Landsman, NP       Or  . haloperidol (HALDOL) 2 MG/ML solution 2 mg  2 mg Sublingual Q6H PRN Lin Landsman, NP       Or  . haloperidol lactate (HALDOL) injection 2 mg  2 mg Intravenous Q6H PRN Lin Landsman, NP      . LORazepam (ATIVAN) 2 MG/ML concentrated solution 1 mg  1 mg Sublingual Q1H PRN Lin Landsman, NP       Or  . LORazepam (ATIVAN) injection 1 mg  1 mg Intravenous Q1H PRN Elmer Picker M, NP      . LORazepam (ATIVAN) tablet 1 mg  1 mg Oral Q1H PRN Elmer Picker M, NP      . morphine 2 MG/ML injection 1-3 mg  1-3 mg Intravenous Q2H PRN Lin Landsman, NP   2 mg at 06/16/20 1540  . morphine CONCENTRATE 10 MG/0.5ML oral solution 5-10 mg  5-10 mg Oral Q2H PRN Lin Landsman, NP      . ondansetron (ZOFRAN-ODT) disintegrating tablet 4 mg  4 mg Oral Q6H PRN Karmen Bongo, MD       Or  . ondansetron [facility]) injection 4 mg  4 mg Intravenous Q6H PRN Karmen Bongo, MD      . polyvinyl alcohol (LIQUIFILM TEARS) 1.4 % ophthalmic solution 1 drop  1 drop Both Eyes QID PRN Karmen Bongo, MD         Discharge Medications: Please see discharge summary for a list of discharge medications.  Relevant Imaging Results:  Relevant Lab Results:   Additional Information SS# [place], [place]

## 2020-06-16 NOTE — Progress Notes (Addendum)
Daily Progress Note   Patient Name: Adrian West       Date: 06/16/2020 DOB: [birth year]  Age: 87 y.o. MRN#: [id number] Attending Physician: Alma Friendly, MD Primary Care Physician: [name], MD Admit Date: 06/15/2020  Reason for Consultation/Follow-up: Disposition, Non pain symptom management, Pain control, Psychosocial/spiritual support and Terminal Care  Subjective: Chart review performed. Received report from primary RN - no acute concerns. RN reports patient has been nonresponsive.  Went to visit patient at bedside - daughters [name] and Chong Sicilian were present. Patient was lying in bed - he did not wake to voice or gentle touch. No signs or non-verbal gestures of pain or discomfort noted. No respiratory distress. Noted he did have increased work of breathing and secretions present. Patient was on 1L O2.   Emotional support provided to family. Discussed symptom management plan to give dose of morphine and robinul for signs/symptoms I see today - family was agreeable. Family expressed interest in getting patient transferred back to [place]. I stated we could transfer patient back to [facility] with hospice support for EOL - family was agreeable. Explained that as of now, the patient seemed to be in a fairly ok window for transport, but we would continue to reassess during the time it takes to get bed at [facility] and hospice set up. Explained the longer we wait, the less stable the patient becomes for transfer. Asked family if they had a hospice organization preference - family stated they are ok with whichever organization [facility] has a good relationship with. Family had questions around improved vital signs and skin coloring today. EOL trajectory was explained. Stated I felt he  was still moving toward EOL - family agreed.   Notified RN to administer morphine and robinul.  TOC requested I call and speak with Donna/[facility] representative to clarify information they needed/questions.  Spoke with Butch Penny at [facility] to clarify information and answered questions about transferring patient today - reviewed information and clarified EOL care patient is currently requiring. Butch Penny stated she would take information to her team and call LCSW back today with update on whether they could accept patient today or tomorrow.  Spoke with Patty via phone and updated that we are waiting to hear back from [facility] on whether they could have a bed available today or tomorrow. Chong Sicilian states family  is ok with transferring patient as soon as possible.   All questions and concerns addressed. Encouraged to call with questions and/or concerns. PMT card provided.    Length of Stay: 1  Current Medications: Scheduled Meds:  . antiseptic oral rinse  15 mL Topical BID    Continuous Infusions:   PRN Meds: acetaminophen **OR** acetaminophen, glycopyrrolate **OR** glycopyrrolate **OR** glycopyrrolate, haloperidol **OR** haloperidol **OR** haloperidol lactate, LORazepam **OR** LORazepam, morphine injection, ondansetron **OR** ondansetron (ZOFRAN) IV, polyvinyl alcohol  Physical Exam Vitals and nursing note reviewed.  Constitutional:      General: He is not in acute distress. Pulmonary:     Effort: No respiratory distress.     Comments: Increased work of breathing Skin:    General: Skin is cool and dry.  Neurological:     Mental Status: He is unresponsive.     Motor: Weakness present.             Vital Signs: BP (!) 143/65 (BP Location: Left Arm)   Pulse 89   Temp (!) 97.5 F (36.4 C) (Tympanic)   Resp 16   Ht 5\' 10"  (1.778 m)   Wt 74 kg   SpO2 92%   BMI 23.41 kg/m  SpO2: SpO2: 92 % O2 Device: O2 Device: Room Air O2 Flow Rate: O2 Flow Rate (L/min): 15  L/min  Intake/output summary:   Intake/Output Summary (Last 24 hours) at 06/16/2020 1140 Last data filed at 06/16/2020 0900 Gross per 24 hour  Intake 0 ml  Output --  Net 0 ml   LBM: Last BM Date:  (PTA) Baseline Weight: Weight: 74 kg Most recent weight: Weight: 74 kg       Palliative Assessment/Data: PPS 10%    Flowsheet Rows   Flowsheet Row Most Recent Value  Intake Tab   Referral Department Hospitalist  Unit at Time of Referral ER  Palliative Care Primary Diagnosis Trauma  Date Notified 06/15/20  Palliative Care Type New Palliative care  Reason for referral End of Life Care Assistance  Date of Admission 06/15/20  Date first seen by Palliative Care 06/15/20  # of days Palliative referral response time 0 Day(s)  # of days IP prior to Palliative referral 0  Clinical Assessment   Psychosocial & Spiritual Assessment   Palliative Care Outcomes   Patient/Family meeting held? Yes  Who was at the meeting? [email or web address]  Palliative Care Outcomes Improved pain interventions, Improved non-pain symptom therapy, Clarified goals of care, Counseled regarding hospice, Provided end of life care assistance, Provided psychosocial or spiritual support  Patient/Family wishes: Interventions discontinued/not started  Mechanical Ventilation, Antibiotics, BiPAP, Tube feedings/TPN, Hemodialysis, NIPPV, Transfusion, Trach, Vasopressors, PEG, Transfer out of ICU      Patient Active Problem List   Diagnosis Date Noted  . Acute CVA (cerebrovascular accident) ([place]) 06/15/2020  . Fall at nursing home 06/15/2020  . Displaced fracture of shaft of left clavicle, initial encounter for closed fracture 06/15/2020  . Atrial fibrillation, chronic ([place]) 06/15/2020  . Right-sided Bell's palsy 02/21/2016  . Sinus bradycardia 02/21/2016  . Malnutrition of moderate degree 02/12/2016  . Fracture of femoral neck, left ([place]) 02/11/2016  . Closed left hip fracture ([place]) 02/11/2016  . Dizziness 12/31/2015  .  Exertional dyspnea 12/31/2015  . Ectropion due to laxity of right eyelid 04/20/2015  . Obstructive chronic bronchitis with exacerbation ([place]) 11/13/2014  . Orthostatic hypotension 10/29/2014  . Protein-calorie malnutrition, severe ([place]) 10/28/2014  . Hypotension 10/27/2014  . SCCA (squamous cell carcinoma) of skin 10/27/2014  .  Anorexia 08/16/2014  . DNR (do not resuscitate) discussion 07/22/2014  . Loss of weight 07/22/2014  . 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 Polycythemia, secondary 01/10/2014  . 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 MGUS (monoclonal gammopathy of unknown significance) 04/11/2013  . Other and unspecified hyperlipidemia 02/24/2012  . PVC's (premature ventricular contractions) 04/05/2011  . TIA (transient ischemic attack) 10/12/2010  . SPINAL STENOSIS, LUMBAR 07/28/2010  . Esophageal stricture 10/27/2008  . DYSPHAGIA 10/27/2008  . Hypothyroidism 02/14/2008  . MITRAL REGURGITATION 02/14/2008  . Essential hypertension 02/14/2008  . GERD 02/14/2008    Palliative Care Assessment & Plan   Patient Profile: 87 y.o. male  with past medical history of TIA, squamous cell carcinoma, lumbar spinal stenosis, salivary gland neoplasm, secondary malignancy of parotid lymph nodes, MGUS, interstitial lung disease, and dementia presented to the ED on 06/15/20 from [place] after an unwitnessed fall. Patient was admitted on 06/15/2020 for end of life care.   ED Course:87 yo M with stroke, had fall, has clavicle fracture. [name] going to see in AM from that perspective. Did get large amount of pain meds due to clavicle fracture. EDP going to repair head lac when he gets back from MRI. [name] consulted for neuro. DNR/DNI per family. Also with hypotension, appears to be actively dying.  Assessment: Acute CVA Displaced fracture of left clavical Chronic atrial  fibrillation Hypertension Fall at nursing home MGUS Terminal care  Recommendations/Plan: Continue full comfort measures Continue DNR/DNI as previously documented Family would like patient transferred back to [facility] with hospice - TOC notified and consult placed Patient may become too unstable for transfer depending on how quickly hospice/[facility] can be set up - provide ongoing assessments  PMT will continue to follow holistically  **ADDENDUM** Multiple conversations had with TOC, patient's family, [facility] liaison, Dr. Horris Latino about disposition. Patient's family would like transfer to [facility] with hospice as soon as possible - provided updated that we can discharge today and they have a bed available. Family understands patient will be discharged to [facility] today with comfort medications and hospice will be able to admit patient once he arrives to facility. Hospice liaison is currently working on getting a time scheduled for patient to be admitted. Comfort meds ordered for discharge. Family is in agreement with plan.  Goals of Care and Additional Recommendations: Limitations on Scope of Treatment: Full Comfort Care  Code Status:    Code Status Orders  (From admission, onward)         Start     Ordered   06/15/20 1156  Do not attempt resuscitation (DNR)  Continuous       Question Answer Comment  In the event of cardiac or respiratory ARREST Do not call a "code blue"   In the event of cardiac or respiratory ARREST Do not perform Intubation, CPR, defibrillation or ACLS   In the event of cardiac or respiratory ARREST Use medication by any route, position, wound care, and other measures to relive pain and suffering. May use oxygen, suction and manual treatment of airway obstruction as needed for comfort.      06/15/20 1155        Code Status History    Date Active Date Inactive Code Status Order ID Comments User Context   06/15/2020 0951 06/15/2020 1155 DNR  [id number]  Karmen Bongo, MD ED   02/11/2016 2017 02/15/2016 2140 DNR [id number]  Ivor Costa, MD ED   10/27/2014 1614 10/29/2014  [place] DNR [id number]  Karen Kitchens Inpatient   03/31/2014 1425 04/01/2014 0332 Full Code [id number]  Azzie Roup, MD [facility]   03/03/2014 1335 03/03/2014 1834 Full Code [id number]  [name], MD Inpatient   Advance Care Planning Activity    Advance Directive Documentation   Flowsheet Row Most Recent Value  Type of Advance Directive Out of facility DNR (pink MOST or yellow form)  Pre-existing out of facility DNR order (yellow form or pink MOST form) Pink Most/Yellow Form available - Physician notified to receive inpatient order  "MOST" Form in Place? --      Prognosis:  Hours - Days  Discharge Planning: [facility] with hospice vs hospital death  Care plan was discussed with patient's family, primary RN, TOC, Dr. Horris Latino, Donna/[facility] representative   Thank you for allowing the Palliative Medicine Team to assist in the care of this patient.   Total Time 75 minutes+ 30 minutes  Prolonged Time Billed  yes       Greater than 50%  of this time was spent counseling and coordinating care related to the above assessment and plan.  Lin Landsman, NP  Please contact Palliative Medicine Team phone at [phone] for questions and concerns.

## 2020-06-16 NOTE — NC FL2 (Signed)
[place] LEVEL OF CARE SCREENING TOOL     IDENTIFICATION  Patient Name: Adrian West Birthdate: 1927-07-25 Sex: male Admission Date (Current Location): 06/15/2020  [facility] [place] Number:  Herbalist and Address:  [facility], [place] [address], [place], [place] [address]      Provider Number: [id number]  Attending Physician Name and Address:  Alma Friendly, MD  Relative Name and Phone Number:  [name] smart [phone]    Current Level of Care: Hospital Recommended Level of Care: Other (Comment) (Home with Hospice) Prior Approval Number:    Date Approved/Denied:   PASRR Number:    Discharge Plan: Other (Comment) (Home with Hospice)    Current Diagnoses: Patient Active Problem List   Diagnosis Date Noted  . Acute CVA (cerebrovascular accident) ([place]) 06/15/2020  . Fall at nursing home 06/15/2020  . Displaced fracture of shaft of left clavicle, initial encounter for closed fracture 06/15/2020  . Atrial fibrillation, chronic ([place]) 06/15/2020  . Right-sided Bell's palsy 02/21/2016  . Sinus bradycardia 02/21/2016  . Malnutrition of moderate degree 02/12/2016  . Fracture of femoral neck, left ([place]) 02/11/2016  . Closed left hip fracture ([place]) 02/11/2016  . Dizziness 12/31/2015  . Exertional dyspnea 12/31/2015  . Ectropion due to laxity of right eyelid 04/20/2015  . Obstructive chronic bronchitis with exacerbation ([place]) 11/13/2014  . Orthostatic hypotension 10/29/2014  . Protein-calorie malnutrition, severe ([place]) 10/28/2014  . Hypotension 10/27/2014  . SCCA (squamous cell carcinoma) of skin 10/27/2014  . Anorexia 08/16/2014  . DNR (do not resuscitate) discussion 07/22/2014  . Loss of weight 07/22/2014  . 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 Polycythemia, secondary 01/10/2014  . 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 MGUS (monoclonal  gammopathy of unknown significance) 04/11/2013  . Other and unspecified hyperlipidemia 02/24/2012  . PVC's (premature ventricular contractions) 04/05/2011  . TIA (transient ischemic attack) 10/12/2010  . SPINAL STENOSIS, LUMBAR 07/28/2010  . Esophageal stricture 10/27/2008  . DYSPHAGIA 10/27/2008  . Hypothyroidism 02/14/2008  . MITRAL REGURGITATION 02/14/2008  . Essential hypertension 02/14/2008  . GERD 02/14/2008    Orientation RESPIRATION BLADDER Height & Weight      (Unable to Assess)  Normal Incontinent Weight: 163 lb 2.3 oz (74 kg) Height:  5\' 10"  (177.8 cm)  BEHAVIORAL SYMPTOMS/MOOD NEUROLOGICAL BOWEL NUTRITION STATUS      Incontinent Diet (Comfort Care)  AMBULATORY STATUS COMMUNICATION OF NEEDS Skin   Total Care Does not communicate Surgical wounds (L head and arm incisions w/ foam dressing)                       Personal Care Assistance Level of Assistance  Bathing,Feeding,Dressing,Total care Bathing Assistance: Maximum assistance Feeding assistance: Maximum assistance Dressing Assistance: Maximum assistance Total Care Assistance: Maximum assistance   Functional Limitations Info  Sight,Hearing,Speech Sight Info:  (Unable to Assess) Hearing Info:  (Unable to Assess) Speech Info:  (Unable to Assess)    SPECIAL CARE FACTORS FREQUENCY                       Contractures Contractures Info: Not present    Additional Factors Info  Code Status,Allergies Code Status Info: DNR Allergies Info: Plavix           Current Medications (06/16/2020):  This is the current hospital active medication list Current Facility-Administered Medications  Medication Dose Route Frequency Provider Last Rate Last  Admin  . acetaminophen (TYLENOL) tablet 650 mg  650 mg Oral Q6H PRN Karmen Bongo, MD       Or  . acetaminophen (TYLENOL) suppository 650 mg  650 mg Rectal Q6H PRN Karmen Bongo, MD      . antiseptic oral rinse (BIOTENE) solution 15 mL  15 mL Topical BID  Elmer Picker M, NP   15 mL at 06/16/20 1236  . glycopyrrolate (ROBINUL) tablet 1 mg  1 mg Oral Q4H PRN Alma Friendly, MD       Or  . glycopyrrolate (ROBINUL) injection 0.2 mg  0.2 mg Subcutaneous Q4H PRN Alma Friendly, MD       Or  . glycopyrrolate (ROBINUL) injection 0.2 mg  0.2 mg Intravenous Q4H PRN Alma Friendly, MD      . haloperidol (HALDOL) tablet 2 mg  2 mg Oral Q6H PRN Lin Landsman, NP       Or  . haloperidol (HALDOL) 2 MG/ML solution 2 mg  2 mg Sublingual Q6H PRN Lin Landsman, NP       Or  . haloperidol lactate (HALDOL) injection 2 mg  2 mg Intravenous Q6H PRN Lin Landsman, NP      . LORazepam (ATIVAN) 2 MG/ML concentrated solution 1 mg  1 mg Sublingual Q1H PRN Lin Landsman, NP       Or  . LORazepam (ATIVAN) injection 1 mg  1 mg Intravenous Q1H PRN Elmer Picker M, NP      . LORazepam (ATIVAN) tablet 1 mg  1 mg Oral Q1H PRN Elmer Picker M, NP      . morphine 2 MG/ML injection 1-3 mg  1-3 mg Intravenous Q2H PRN Lin Landsman, NP   2 mg at 06/16/20 1235  . morphine CONCENTRATE 10 MG/0.5ML oral solution 5-10 mg  5-10 mg Oral Q2H PRN Lin Landsman, NP      . ondansetron (ZOFRAN-ODT) disintegrating tablet 4 mg  4 mg Oral Q6H PRN Karmen Bongo, MD       Or  . ondansetron [facility]) injection 4 mg  4 mg Intravenous Q6H PRN Karmen Bongo, MD      . polyvinyl alcohol (LIQUIFILM TEARS) 1.4 % ophthalmic solution 1 drop  1 drop Both Eyes QID PRN Karmen Bongo, MD         Discharge Medications: Please see discharge summary for a list of discharge medications.  Relevant Imaging Results:  Relevant Lab Results:   Additional Information SS# [place], [place]

## 2020-06-16 NOTE — TOC Transition Note (Signed)
Transition of Care [facility]) - CM/SW Discharge Note   Patient Details  Name: Adrian West MRN: [id number] Date of Birth: September 29, 1927  Transition of [facility]) CM/SW Contact:  Gabrielle Dare Phone Number: 06/16/2020, 4:27 PM   Clinical Narrative:    Patient will Discharge To: [facility] Anticipated DC Date:06/16/20 Family Notified:yes, Darlin Drop, daughter, [phone] Transport [id number]   Per MD patient ready for DC to [facility] . RN, patient, patient's family, and facility notified of DC. Assessment, Fl2/Pasrr, and Discharge Summary sent to facility. RN given number for report [phone] Rehab Nurse). DC packet on chart. Ambulance transport requested for patient.   CSW signing off.  Reed Breech LCSWA [phone]     Final next level of care: Skilled Nursing Facility Barriers to Discharge: No Barriers Identified   Patient Goals and CMS Choice        Discharge Placement              Patient chooses bed at:  [facility]) Patient to be transferred to facility by: [place] Name of family member notified: [name] Smart Patient and family notified of of transfer: 06/16/20  Discharge Plan and Services                                     Social Determinants of Health (SDOH) Interventions     Readmission Risk Interventions No flowsheet data found.

## 2020-06-19 ENCOUNTER — Encounter: Payer: Self-pay | Admitting: Internal Medicine

## 2020-07-07 DEATH — deceased
